# Patient Record
Sex: Female | Born: 1935 | Race: White | Hispanic: No | Smoking: Never smoker
Health system: Southern US, Community
[De-identification: ages and names within clinical notes are randomized; demographics above are authoritative.]

## PROBLEM LIST (undated history)

## (undated) DIAGNOSIS — R569 Unspecified convulsions: Secondary | ICD-10-CM

## (undated) DIAGNOSIS — I10 Essential (primary) hypertension: Secondary | ICD-10-CM

## (undated) DIAGNOSIS — M1990 Unspecified osteoarthritis, unspecified site: Secondary | ICD-10-CM

## (undated) DIAGNOSIS — E119 Type 2 diabetes mellitus without complications: Secondary | ICD-10-CM

## (undated) DIAGNOSIS — I4891 Unspecified atrial fibrillation: Secondary | ICD-10-CM

## (undated) DIAGNOSIS — K449 Diaphragmatic hernia without obstruction or gangrene: Secondary | ICD-10-CM

## (undated) DIAGNOSIS — D649 Anemia, unspecified: Secondary | ICD-10-CM

## (undated) DIAGNOSIS — K219 Gastro-esophageal reflux disease without esophagitis: Secondary | ICD-10-CM

## (undated) DIAGNOSIS — C4491 Basal cell carcinoma of skin, unspecified: Secondary | ICD-10-CM

## (undated) DIAGNOSIS — E785 Hyperlipidemia, unspecified: Secondary | ICD-10-CM

## (undated) HISTORY — DX: Unspecified osteoarthritis, unspecified site: M19.90

## (undated) HISTORY — DX: Basal cell carcinoma of skin, unspecified: C44.91

## (undated) HISTORY — PX: CARDIAC DEFIBRILLATOR PLACEMENT: SHX171

## (undated) HISTORY — DX: Hyperlipidemia, unspecified: E78.5

## (undated) HISTORY — DX: Unspecified convulsions: R56.9

## (undated) HISTORY — PX: TUBAL LIGATION: SHX77

## (undated) HISTORY — PX: CHOLECYSTECTOMY: SHX55

## (undated) HISTORY — DX: Anemia, unspecified: D64.9

## (undated) HISTORY — PX: SPINAL CORD STIMULATOR REMOVAL: SHX2423

## (undated) HISTORY — DX: Gastro-esophageal reflux disease without esophagitis: K21.9

---

## 2001-02-25 HISTORY — PX: CATARACT EXTRACTION: SUR2

## 2001-06-11 HISTORY — PX: LUMBAR LAMINECTOMY: SHX95

## 2006-02-09 HISTORY — PX: LUMBAR LAMINECTOMY: SHX95

## 2006-07-07 HISTORY — PX: LUMBAR LAMINECTOMY: SHX95

## 2009-03-28 DIAGNOSIS — R569 Unspecified convulsions: Secondary | ICD-10-CM

## 2009-03-28 HISTORY — DX: Unspecified convulsions: R56.9

## 2016-03-26 ENCOUNTER — Encounter (HOSPITAL_COMMUNITY): Payer: Self-pay | Admitting: Emergency Medicine

## 2016-03-26 ENCOUNTER — Ambulatory Visit (HOSPITAL_COMMUNITY)
Admission: EM | Admit: 2016-03-26 | Discharge: 2016-03-26 | Disposition: A | Discharge status: Home / Self Care | Payer: TRICARE For Life (TFL) | Attending: Emergency Medicine | Admitting: Emergency Medicine

## 2016-03-26 DIAGNOSIS — I10 Essential (primary) hypertension: Secondary | ICD-10-CM

## 2016-03-26 DIAGNOSIS — R413 Other amnesia: Secondary | ICD-10-CM

## 2016-03-26 DIAGNOSIS — E119 Type 2 diabetes mellitus without complications: Secondary | ICD-10-CM

## 2016-03-26 DIAGNOSIS — Z760 Encounter for issue of repeat prescription: Secondary | ICD-10-CM

## 2016-03-26 HISTORY — DX: Type 2 diabetes mellitus without complications: E11.9

## 2016-03-26 HISTORY — DX: Essential (primary) hypertension: I10

## 2016-03-26 HISTORY — DX: Diaphragmatic hernia without obstruction or gangrene: K44.9

## 2016-03-26 MED ORDER — MEMANTINE HCL 10 MG PO TABS: 10.0000 mg | ORAL_TABLET | Freq: Two times a day (BID) | ORAL | 2 refills | Status: DC

## 2016-03-26 MED ORDER — SITAGLIPTIN PHOSPHATE 100 MG PO TABS: 100.0000 mg | ORAL_TABLET | Freq: Every day | ORAL | 2 refills | Status: DC

## 2016-03-26 MED ORDER — METOPROLOL TARTRATE 50 MG PO TABS: 25.0000 mg | ORAL_TABLET | Freq: Two times a day (BID) | ORAL | 2 refills | Status: DC

## 2016-03-26 MED ORDER — SERTRALINE HCL 50 MG PO TABS: 50.0000 mg | ORAL_TABLET | Freq: Every day | ORAL | 2 refills | Status: DC

## 2016-03-26 MED ORDER — DONEPEZIL HCL 10 MG PO TABS: 10.0000 mg | ORAL_TABLET | Freq: Every day | ORAL | 2 refills | Status: DC

## 2016-03-26 MED ORDER — CYANOCOBALAMIN 1000 MCG/ML IJ SOLN: 1000.0000 ug | INTRAMUSCULAR | 0 refills | Status: DC

## 2016-03-26 NOTE — ED Triage Notes (Signed)
Patient has moved to [place] from [place].  Patient is requesting refill of medications

## 2016-03-26 NOTE — ED Provider Notes (Signed)
Brenda [name]    CSN: [id number] Arrival date & time: 03/26/16  1205     History   Chief Complaint Chief Complaint  Patient presents with  . Medication Refill    HPI Brenda [name] is a 82 y.o. female.   HPI She is an 82 year old woman here with her husband for medication refill. They recently moved here from [place] and has been unable to get their prescriptions to transfer. They're working on finding a primary care doctor. She denies any acute concerns or complaints.  Past Medical History:  Diagnosis Date  . Diabetes mellitus without complication ([place])   . Hiatal hernia   . Hypertension     There are no active problems to display for this patient.   Past Surgical History:  Procedure Laterality Date  . BACK SURGERY    . CARDIAC DEFIBRILLATOR PLACEMENT      OB History    No data available       Home Medications    Prior to Admission medications   Medication Sig Start Date End Date Taking? Authorizing Provider  atorvastatin (LIPITOR) 40 MG tablet Take 40 mg by mouth daily.   Yes Historical Provider, MD  dipyridamole-aspirin (AGGRENOX) 200-25 MG 12hr capsule Take 1 capsule by mouth 2 (two) times daily.   Yes Historical Provider, MD  HYDROcodone-acetaminophen (NORCO) 7.5-325 MG tablet Take 1 tablet by mouth 4 (four) times daily as needed for moderate pain.   Yes Historical Provider, MD  lisinopril (PRINIVIL,ZESTRIL) 10 MG tablet Take 10 mg by mouth daily.   Yes Historical Provider, MD  Multiple Vitamins-Minerals (ICAPS AREDS 2 PO) Take by mouth 2 (two) times daily.   Yes Historical Provider, MD  OVER THE COUNTER MEDICATION "prolin" 2 shot per year   Yes Historical Provider, MD  pregabalin (LYRICA) 25 MG capsule Take 25 mg by mouth 2 (two) times daily.   Yes Historical Provider, MD  ranitidine (ZANTAC) 150 MG tablet Take 150 mg by mouth 2 (two) times daily.   Yes Historical Provider, MD  VITAMIN B1-B12 IM Inject into the muscle. One injection once  a week   Yes Historical Provider, MD  VITAMIN D, ERGOCALCIFEROL, PO Take 5,000 Units by mouth.   Yes Historical Provider, MD  cyanocobalamin (,VITAMIN B-12,) 1000 MCG/ML injection Inject 1 mL (1,000 mcg total) into the skin once a week. 03/26/16   Melony Overly, MD  donepezil (ARICEPT) 10 MG tablet Take 1 tablet (10 mg total) by mouth at bedtime. 03/26/16   Melony Overly, MD  memantine (NAMENDA) 10 MG tablet Take 1 tablet (10 mg total) by mouth 2 (two) times daily. 03/26/16   Melony Overly, MD  metoprolol (LOPRESSOR) 50 MG tablet Take 0.5 tablets (25 mg total) by mouth 2 (two) times daily. 03/26/16   Melony Overly, MD  sertraline (ZOLOFT) 50 MG tablet Take 1 tablet (50 mg total) by mouth daily. 03/26/16   Melony Overly, MD  sitaGLIPtin (JANUVIA) 100 MG tablet Take 1 tablet (100 mg total) by mouth daily. 03/26/16   Melony Overly, MD    Family History No family history on file.  Social History Social History  Substance Use Topics  . Smoking status: Never Smoker  . Smokeless tobacco: Not on file  . Alcohol use No     Allergies   Aspirin; Erythromycin; Sotalol; Augmentin [amoxicillin-pot clavulanate]; Crestor [rosuvastatin calcium]; Keflex [cephalexin]; and Septra [sulfamethoxazole-trimethoprim]   Review of Systems Review of Systems As in history of present illness  Physical Exam Triage Vital Signs ED Triage Vitals  Enc Vitals Group     BP      Pulse      Resp      Temp      Temp src      SpO2      Weight      Height      Head Circumference      Peak Flow      Pain Score      Pain Loc      Pain Edu?      Excl. in [place]?    No data found.   Updated Vital Signs BP 147/67 (BP Location: Right Arm)   Pulse 80   Temp 97.5 F (36.4 C) (Oral)   Resp 16   SpO2 100%   Visual Acuity Right Eye Distance:   Left Eye Distance:   Bilateral Distance:    Right Eye Near:   Left Eye Near:    Bilateral Near:     Physical Exam  Constitutional: She appears well-developed and  well-nourished. No distress.  Cardiovascular: Normal rate, regular rhythm and normal heart sounds.   No murmur heard. Pulmonary/Chest: Effort normal and breath sounds normal. No respiratory distress. She has no wheezes. She has no rales.  Neurological: She is alert.     UC Treatments / Results  Labs (all labs ordered are listed, but only abnormal results are displayed) Labs Reviewed - No data to display  EKG  EKG Interpretation None       Radiology No results found.  Procedures Procedures (including critical care time)  Medications Ordered in UC Medications - No data to display   Initial Impression / Assessment and Plan / UC Course  I have reviewed the triage vital signs and the nursing notes.  Pertinent labs & imaging results that were available during my care of the patient were reviewed by me and considered in my medical decision making (see chart for details).  Clinical Course     I provided a 3 month supply of the medications. Prescriptions were sent based on prescription bottles. She will continue to work on finding a PCP.  Final Clinical Impressions(s) / UC Diagnoses   Final diagnoses:  Medication refill  Type 2 diabetes mellitus without complication, without long-term current use of insulin ([place])  Memory disorder  Essential hypertension    New Prescriptions Discharge Medication List as of 03/26/2016  1:31 PM    START taking these medications   Details  cyanocobalamin (,VITAMIN B-12,) 1000 MCG/ML injection Inject 1 mL (1,000 mcg total) into the skin once a week., Starting Sat 03/26/2016, Normal    sertraline (ZOLOFT) 50 MG tablet Take 1 tablet (50 mg total) by mouth daily., Starting Sat 03/26/2016, Normal         Melony Overly, MD 03/26/16 1409

## 2016-03-26 NOTE — Discharge Instructions (Signed)
I've provided a 3 month supply of your medications. Please continue to work on finding a primary care doctor.

## 2016-05-05 ENCOUNTER — Encounter: Payer: Self-pay | Admitting: Family Medicine

## 2016-05-05 ENCOUNTER — Ambulatory Visit (INDEPENDENT_AMBULATORY_CARE_PROVIDER_SITE_OTHER): Payer: Medicare Other | Admitting: Family Medicine

## 2016-05-05 ENCOUNTER — Encounter: Payer: Self-pay | Admitting: Gastroenterology

## 2016-05-05 VITALS — BP 124/50 | HR 77 | Temp 98.0°F | Ht 60.98 in | Wt 119.4 lb

## 2016-05-05 DIAGNOSIS — M545 Other chronic pain: Secondary | ICD-10-CM

## 2016-05-05 DIAGNOSIS — I10 Essential (primary) hypertension: Secondary | ICD-10-CM | POA: Diagnosis not present

## 2016-05-05 DIAGNOSIS — K449 Diaphragmatic hernia without obstruction or gangrene: Secondary | ICD-10-CM | POA: Diagnosis not present

## 2016-05-05 DIAGNOSIS — R413 Other amnesia: Secondary | ICD-10-CM

## 2016-05-05 DIAGNOSIS — Z95810 Presence of automatic (implantable) cardiac defibrillator: Secondary | ICD-10-CM | POA: Diagnosis not present

## 2016-05-05 DIAGNOSIS — E119 Type 2 diabetes mellitus without complications: Secondary | ICD-10-CM

## 2016-05-05 DIAGNOSIS — Z8673 Personal history of transient ischemic attack (TIA), and cerebral infarction without residual deficits: Secondary | ICD-10-CM

## 2016-05-05 DIAGNOSIS — G8929 Other chronic pain: Secondary | ICD-10-CM

## 2016-05-05 DIAGNOSIS — M549 Dorsalgia, unspecified: Secondary | ICD-10-CM | POA: Insufficient documentation

## 2016-05-05 LAB — COMPLETE METABOLIC PANEL WITH GFR
ALT: 8 U/L (ref 6–29)
AST: 17 U/L (ref 10–35)
Albumin: 3.7 g/dL (ref 3.6–5.1)
Alkaline Phosphatase: 73 U/L (ref 33–130)
BUN: 26 mg/dL — ABNORMAL HIGH (ref 7–25)
CO2: 22 mmol/L (ref 20–31)
Calcium: 9 mg/dL (ref 8.6–10.4)
Chloride: 105 mmol/L (ref 98–110)
Creat: 1.48 mg/dL — ABNORMAL HIGH (ref 0.60–0.88)
GFR, Est African American: 38 mL/min — ABNORMAL LOW (ref >=60)
GFR, Est Non African American: 33 mL/min — ABNORMAL LOW (ref >=60)
Glucose, Bld: 122 mg/dL — ABNORMAL HIGH (ref 65–99)
Potassium: 4.2 mmol/L (ref 3.5–5.3)
Sodium: 138 mmol/L (ref 135–146)
Total Bilirubin: 0.4 mg/dL (ref 0.2–1.2)
Total Protein: 6.9 g/dL (ref 6.1–8.1)

## 2016-05-05 LAB — CBC
HCT: 29 % — ABNORMAL LOW (ref 35.0–45.0)
Hemoglobin: 9.5 g/dL — ABNORMAL LOW (ref 11.7–15.5)
MCH: 31.5 pg (ref 27.0–33.0)
MCHC: 32.8 g/dL (ref 32.0–36.0)
MCV: 96 fL (ref 80.0–100.0)
MPV: 10.2 fL (ref 7.5–12.5)
Platelets: 198 10*3/uL (ref 140–400)
RBC: 3.02 MIL/uL — ABNORMAL LOW (ref 3.80–5.10)
RDW: 18 % — ABNORMAL HIGH (ref 11.0–15.0)
WBC: 7.4 10*3/uL (ref 3.8–10.8)

## 2016-05-05 LAB — POCT GLYCOSYLATED HEMOGLOBIN (HGB A1C): Hemoglobin A1C: 5.7

## 2016-05-05 MED ORDER — ASPIRIN EC 81 MG PO TBEC: 81.0000 mg | DELAYED_RELEASE_TABLET | Freq: Every day | ORAL | 3 refills | Status: DC

## 2016-05-05 NOTE — Patient Instructions (Addendum)
It was a pleasure to see you today! Thank you for choosing [facility] for your primary care. Brenda [name] was seen for new patient appt. I would like to see you back in 3 months to check on your blood pressure and stomach pain. Call us if you need [place] prior to this.   STOP taking Namena, Aricept, Aggrenox, and Januvia. Try to decrease the Norco to once per day. You can take up to 4 tylenol pills (regular strength 500mg ) per day, but you can take 1 norco in addition to this.   Best,  Dr. Lindell Noe

## 2016-05-05 NOTE — Assessment & Plan Note (Signed)
Recheck hemoglobin A1c today, result was 5.7. Discontinued Januvia, A1c goal for this patient would be greater than 8 given risk of hypoglycemia and falls with tight glucose control.

## 2016-05-05 NOTE — Assessment & Plan Note (Signed)
Patient is not sure of dating of her stroke, daughter reports that it was an incidental finding. Was placed on Aggrenox for this, discontinued this medicine and recommended the patient take enteric-coated aspirin 81 mg daily with food.

## 2016-05-05 NOTE — Assessment & Plan Note (Signed)
Last interrogated November 2017, several episodes of non-concerning sinus tach were found at this time. Placed cardiac referral for future monitoring.

## 2016-05-05 NOTE — Assessment & Plan Note (Signed)
Patient was previously diagnosed with Alzheimer's, although since this seems unlikely based on my exam in our conversation today. Discontinued Namenda and Aricept after shared decision-making discussion regarding limited benefit of these medicines, with maximum benefit being likely 3 months of stable memory loss.

## 2016-05-05 NOTE — Assessment & Plan Note (Addendum)
Blood pressure currently within range, if not slightly low. On lisinopril 10 mg and Lopressor 50 mg daily. Offered for the patient to decrease lisinopril, but she deferred this for later appointment. Will check CMP and CBC today.

## 2016-05-05 NOTE — Assessment & Plan Note (Signed)
Pain after eating is bothersome to patient. Placed GI referral for possible repeat EGD. Counseled that continued Zantac as appropriate, offered PPI, but patient declined as prior doctor had told her this was not a good long-term medicine. Suggested the Norco is not an appropriate medicine for this type of pain and should not be helpful.

## 2016-05-05 NOTE — Progress Notes (Signed)
CC: new patient  HPI Recently moved back from [place], lives with husband, daughter, and son. Was formerly a Naval architect. Daughter was a Occupational psychologist.   Patient's primary concern today is what she considers severe reflux due to a previously diagnosed hiatal hernia. She cannot recall the date of her last EGD, but the GI doctor told her that she had a hiatal hernia and should just eat soft foods. She takes zantac with every meal, but she has also been using her Norco (written as 7.5mg  q4hrs) for this painful sensation as well. Occasional feelings of food getting stuck in her throat, but doesn't describe dysphagia. Daughter notes that she has lost about 25 pounds over the last year. They think this is due to her eating less.  Cardiac history- daughter reports that patient was prescribed sotalol in the past and told that a side effect could be seizures, and the patient sees from this, and was hospitalized. Printed cardiology note from [place] suggests that the patient has an AICD secondary to episodes of V. tach, although since ICD placement no further episodes have been found. Last ICD interrogation was November 2017 finding several episodes of non-concerning sinus tachycardia. Patient also takes lisinopril 10 mg for hypertension, as well as Lopressor for such. Has occasional episodes of dizziness, no passing out.  Memory-daughter is concerned for occasional forgetfulness, but at some point patient was diagnosed with Alzheimer's. Based on the extensive nature of our conversation and her detailed memories, this seems unlikely. Had been prescribed Aricept and Namenda for such.  Mood-daughter reports the patient occasionally gets angry. Takes Zoloft 50 mg daily for this.  CVA-patient and daughter are unclear about whether patient has a history of stroke, although daughter states that a stroke was possibly seen on prior imaging. As a result of this, patient is on Aggrenox (aspirin and  antiplatelet combo medicine). No residual deficits noted.  DM-reported diagnosis of diabetes. Only on Januvia. Doesn't check sugars at home.  Back pain-has had placement of spinal stimulator, and uses Norco for this as well.  Health goals: When I noted that she is on many medicines, patient and daughter suggested that they would prefer for her to be on fewer medicines. I suggested that falls are a concern in the elderly, and patient reported that falls are her greatest concern.  CC, SH/smoking status, and VS noted  Objective: BP (!) 124/50   Pulse 77   Temp 98 F (36.7 C) (Oral)   Ht 5' 0.98" (1.549 m)   Wt 119 lb 6.4 oz (54.2 kg)   SpO2 95%   BMI 22.57 kg/m  Gen: NAD, alert, cooperative, and pleasant female.  HEENT: NCAT, EOMI, PERRL CV: RRR, no murmur Resp: CTAB, no wheezes, non-labored Abd: SNTND, BS present, no guarding or organomegaly Ext: No edema, warm Neuro: Alert and oriented, Speech clear, No gross deficits  Assessment and plan:  Essential hypertension Blood pressure currently within range, if not slightly low. On lisinopril 10 mg and Lopressor 50 mg daily. Offered for the patient to decrease lisinopril, but she deferred this for later appointment. Will check CMP and CBC today.  Hiatal hernia Pain after eating is bothersome to patient. Placed GI referral for possible repeat EGD. Counseled that continued Zantac as appropriate, offered PPI, but patient declined as prior doctor had told her this was not a good long-term medicine. Suggested the Norco is not an appropriate medicine for this type of pain and should not be helpful.  Type 2  diabetes mellitus without complication, without long-term current use of insulin ([facility]) Recheck hemoglobin A1c today, result was 5.7. Discontinued Januvia, A1c goal for this patient would be greater than 8 given risk of hypoglycemia and falls with tight glucose control.  History of CVA (cerebrovascular accident) without residual  deficits Patient is not sure of dating of her stroke, daughter reports that it was an incidental finding. Was placed on Aggrenox for this, discontinued this medicine and recommended the patient take enteric-coated aspirin 81 mg daily with food.  Memory change Patient was previously diagnosed with Alzheimer's, although since this seems unlikely based on my exam in our conversation today. Discontinued Namenda and Aricept after shared decision-making discussion regarding limited benefit of these medicines, with maximum benefit being likely 3 months of stable memory loss.  AICD (automatic cardioverter/defibrillator) present Last interrogated November 2017, several episodes of non-concerning sinus tach were found at this time. Placed cardiac referral for future monitoring.  Back pain It has been using Norco 1-2 times per day for this. History of surgeries in March 2003 and November 2007 including laminotomy and spinal stimulator placement. Stimulator was later removed in 2009. Counseled patient that narcotics increase her risk for falls, and suggested that we try to limit her use of Norco to once daily. Gave patient appropriate recommendations for Tylenol dosing (limit to 2 g or less) and suggested that she keep her Norco secured. Prior PCP had given 3 months of every 4 Norco, and patient has a large quantity of these in her purse today.   Orders Placed This Encounter  Procedures  . COMPLETE METABOLIC PANEL WITH GFR  . CBC  . Ambulatory referral to Gastroenterology    Referral Priority:   Routine    Referral Type:   Consultation    Referral Reason:   Specialty Services Required    Number of Visits Requested:   1  . Ambulatory referral to Cardiology    Referral Priority:   Routine    Referral Type:   Consultation    Referral Reason:   Specialty Services Required    Requested Specialty:   Cardiology    Number of Visits Requested:   1  . POCT glycosylated hemoglobin (Hb A1C)    Meds ordered  this encounter  Medications  . aspirin EC 81 MG tablet    Sig: Take 1 tablet (81 mg total) by mouth daily. Take only with food.    Dispense:  30 tablet    Refill:  3     Ralene Ok, MD, PGY1 05/05/2016 3:50 PM

## 2016-05-05 NOTE — Assessment & Plan Note (Signed)
It has been using Norco 1-2 times per day for this. History of surgeries in March 2003 and November 2007 including laminotomy and spinal stimulator placement. Stimulator was later removed in 2009. Counseled patient that narcotics increase her risk for falls, and suggested that we try to limit her use of Norco to once daily. Gave patient appropriate recommendations for Tylenol dosing (limit to 2 g or less) and suggested that she keep her Norco secured. Prior PCP had given 3 months of every 4 Norco, and patient has a large quantity of these in her purse today.

## 2016-05-06 ENCOUNTER — Telehealth: Payer: Self-pay | Admitting: Family Medicine

## 2016-05-06 ENCOUNTER — Encounter: Payer: Self-pay | Admitting: Cardiology

## 2016-05-06 NOTE — Telephone Encounter (Signed)
Reviewed labs from yesterday - Cr elevated to 1.48. Called patient and daughter - they have no recollection of issues with kidney function or Cr. Family has ROI forms for both GI and PCP in [place] and will drop these off early next week. Scheduled patient with me later this month to recheck and get repeat labs. Did not place future orders for labs in case GI or cards (has appts between now and mine) want to draw labs, so we don't duplicate.  Ralene Ok, MD

## 2016-05-09 ENCOUNTER — Encounter: Payer: Self-pay | Admitting: Cardiology

## 2016-05-09 ENCOUNTER — Ambulatory Visit (INDEPENDENT_AMBULATORY_CARE_PROVIDER_SITE_OTHER): Payer: Medicare Other | Admitting: Cardiology

## 2016-05-09 VITALS — BP 148/80 | HR 70 | Ht 61.0 in | Wt 121.4 lb

## 2016-05-09 DIAGNOSIS — Z95810 Presence of automatic (implantable) cardiac defibrillator: Secondary | ICD-10-CM

## 2016-05-09 DIAGNOSIS — I472 Ventricular tachycardia, unspecified: Secondary | ICD-10-CM

## 2016-05-09 LAB — CUP PACEART INCLINIC DEVICE CHECK
Brady Statistic RA Percent Paced: 52 %
Brady Statistic RV Percent Paced: 1 % — CL
Date Time Interrogation Session: 20180212050000
HighPow Impedance: 36 Ohm
HighPow Impedance: 48 Ohm
Implantable Lead Implant Date: 20111110
Implantable Lead Implant Date: 20111110
Implantable Lead Location: 753859
Implantable Lead Location: 753860
Implantable Lead Model: 157
Implantable Lead Model: 4135
Implantable Lead Serial Number: 28803817
Implantable Lead Serial Number: 302861
Implantable Pulse Generator Implant Date: 20111110
Lead Channel Impedance Value: 436 Ohm
Lead Channel Impedance Value: 725 Ohm
Lead Channel Pacing Threshold Amplitude: 0.4 V
Lead Channel Pacing Threshold Amplitude: 0.9 V
Lead Channel Pacing Threshold Pulse Width: 0.4 ms
Lead Channel Pacing Threshold Pulse Width: 0.4 ms
Lead Channel Sensing Intrinsic Amplitude: 12.3 mV
Lead Channel Sensing Intrinsic Amplitude: 3.6 mV
Lead Channel Setting Pacing Amplitude: 2 V
Lead Channel Setting Pacing Amplitude: 2.4 V
Lead Channel Setting Pacing Pulse Width: 0.4 ms
Lead Channel Setting Sensing Sensitivity: 0.6 mV
Pulse Gen Serial Number: 169112

## 2016-05-09 MED ORDER — METOPROLOL TARTRATE 50 MG PO TABS: 50.0000 mg | ORAL_TABLET | Freq: Two times a day (BID) | ORAL | 3 refills | Status: DC

## 2016-05-09 NOTE — Patient Instructions (Signed)
Medication Instructions:   Your physician has recommended you make the following change in your medication:  1) INCREASE Metoprolol tartrate to 50 mg (1 whole tablet)  twice a day   --- If you need a refill on your cardiac medications before your next appointment, please call your pharmacy. ---  Labwork:  None ordered  Testing/Procedures:  None ordered  Follow-Up: Remote monitoring is used to monitor your Pacemaker of ICD from home. This monitoring reduces the number of office visits required to check your device to one time per year. It allows [place] to keep an eye on the functioning of your device to ensure it is working properly. You are scheduled for a device check from home on 08/08/2016. You may send your transmission at any time that day. If you have a wireless device, the transmission will be sent automatically. After your physician reviews your transmission, you will receive a postcard with your next transmission date.   Your physician wants you to follow-up in: 6 months with Dr. Curt Bears.  You will receive a reminder letter in the mail two months in advance. If you don't receive a letter, please call our office to schedule the follow-up appointment.  Thank you for choosing [facility]!!   Trinidad Curet, RN [phone]

## 2016-05-09 NOTE — Progress Notes (Signed)
Electrophysiology Office Note   Date:  05/09/2016   ID:  Brenda [name], DOB Oct 03, 1931, MRN [id number]  PCP:  Ralene Ok, MD  Primary Electrophysiologist:  Abbagail Scaff Meredith Leeds, MD    Chief Complaint  Patient presents with  . DEFIB CHECK     History of Present Illness: Brenda [name] is a 83 y.o. female who presents today for electrophysiology evaluation.   She has a history of CVA, diabetes, and hypertension. She has a defibrillator in place, with note suggesting that it was due to history of ventricular tachycardia. She had an echo done in November 2017 that showed a preserved ejection fraction of 60%, moderate tricuspid regurgitation, and a severely enlarged left atrium.She says that she has been feeling well other than some chest pain. She attributes the chest pain to reflux from her hiatal hernia. She says that the chest pain is worsened when she eats and improved when she takes Zantac. There is no relation to exertion with her chest pain.  According to her family, she was started on sotalol a few years ago. On the sotalol, she had, what her daughter calls seizures. They also describe that she had a flat line on her EKG. She had an ICD placed at that time. Sotalol was thus stopped.   Today, she denies symptoms of palpitations, shortness of breath, orthopnea, PND, lower extremity edema, claudication, dizziness, presyncope, syncope, bleeding, or neurologic sequela. The patient is tolerating medications without difficulties and is otherwise without complaint today.    Past Medical History:  Diagnosis Date  . Diabetes mellitus without complication ([place])   . Hiatal hernia   . Hypertension    Past Surgical History:  Procedure Laterality Date  . BACK SURGERY    . CARDIAC DEFIBRILLATOR PLACEMENT       Current Outpatient Prescriptions  Medication Sig Dispense Refill  . aspirin EC 81 MG tablet Take 1 tablet (81 mg total) by mouth daily. Take only with food. 30 tablet 3   . atorvastatin (LIPITOR) 40 MG tablet Take 40 mg by mouth daily.    . cyanocobalamin (,VITAMIN B-12,) 1000 MCG/ML injection Inject 1 mL (1,000 mcg total) into the skin once a week. 12 mL 0  . HYDROcodone-acetaminophen (NORCO) 7.5-325 MG tablet Take 1 tablet by mouth 4 (four) times daily as needed for moderate pain.    Marland Kitchen lisinopril (PRINIVIL,ZESTRIL) 10 MG tablet Take 10 mg by mouth daily.    . metoprolol (LOPRESSOR) 50 MG tablet Take 0.5 tablets (25 mg total) by mouth 2 (two) times daily. 30 tablet 2  . Multiple Vitamins-Minerals (ICAPS AREDS 2 PO) Take by mouth 2 (two) times daily.    Marland Kitchen OVER THE COUNTER MEDICATION "prolin" 2 shot per year    . pregabalin (LYRICA) 25 MG capsule Take 25 mg by mouth 2 (two) times daily.    . ranitidine (ZANTAC) 150 MG tablet Take 150 mg by mouth 2 (two) times daily.    . sertraline (ZOLOFT) 50 MG tablet Take 1 tablet (50 mg total) by mouth daily. 30 tablet 2  . VITAMIN B1-B12 IM Inject into the muscle. One injection once a week    . VITAMIN D, ERGOCALCIFEROL, PO Take 5,000 Units by mouth.     No current facility-administered medications for this visit.     Allergies:   Aspirin; Erythromycin; Sotalol; Augmentin [amoxicillin-pot clavulanate]; Crestor [rosuvastatin calcium]; Keflex [cephalexin]; and Septra [sulfamethoxazole-trimethoprim]   Social History:  The patient  reports that she has never smoked. She has never used  smokeless tobacco. She reports that she does not drink alcohol.   Family History:  The patient's family history includes Cancer in her sister; Clotting disorder in her maternal uncle; Diabetes in her mother; Heart failure in her father and sister.    ROS:  Please see the history of present illness.   Otherwise, review of systems is positive for Weight loss, appetite change, chest pain, hearing loss, abdominal pain, back pain, balance problems.   All other systems are reviewed and negative.    PHYSICAL EXAM: VS:  BP (!) 148/80   Pulse 70    Ht 5\' 1"  (1.549 m)   Wt 121 lb 6.4 oz (55.1 kg)   BMI 22.94 kg/m  , BMI Body mass index is 22.94 kg/m. GEN: Well nourished, well developed, in no acute distress  HEENT: normal  Neck: no JVD, carotid bruits, or masses Cardiac: RRR; no murmurs, rubs, or gallops,no edema  Respiratory:  clear to auscultation bilaterally, normal work of breathing GI: soft, nontender, nondistended, + BS MS: no deformity or atrophy  Skin: warm and dry,  device pocket is well healed Neuro:  Strength and sensation are intact Psych: euthymic mood, full affect  EKG:  EKG is ordered today. Personal review of the ekg ordered shows A paced, V sense, rate 70   Device interrogation is reviewed today in detail.  See PaceArt for details.   Recent Labs: 05/05/2016: ALT 8; BUN 26; Creat 1.48; Hemoglobin 9.5; Platelets 198; Potassium 4.2; Sodium 138    Lipid Panel  No results found for: CHOL, TRIG, HDL, CHOLHDL, VLDL, LDLCALC, LDLDIRECT   Wt Readings from Last 3 Encounters:  05/09/16 121 lb 6.4 oz (55.1 kg)  05/05/16 119 lb 6.4 oz (54.2 kg)      Other studies Reviewed: Additional studies/ records that were reviewed today include: PCP notes   ASSESSMENT AND PLAN:  1.  Hypertension: Mildly elevated today, was normal at her PCPs visit.  2. ICD in place: Per records was placed due to a history of ventricular tachycardia. only very short runs of nonsustained VT noted on her device interrogation. She also had SVT noted. We'll plan to increase her metoprolol to 50 mg twice a day.  Current medicines are reviewed at length with the patient today.   The patient does not have concerns regarding her medicines.  The following changes were made today:  Increase metoprolol  Labs/ tests ordered today include:  No orders of the defined types were placed in this encounter.    Disposition:   FU with Patches Mcdonnell 6 months  Signed, Piercen Covino Meredith Leeds, MD  05/09/2016 3:27 PM     [facility] [address] [place] [address] [phone] (office) [phone] (fax)

## 2016-05-20 ENCOUNTER — Encounter: Payer: Self-pay | Admitting: Gastroenterology

## 2016-05-20 ENCOUNTER — Ambulatory Visit (INDEPENDENT_AMBULATORY_CARE_PROVIDER_SITE_OTHER): Payer: Medicare Other | Admitting: Gastroenterology

## 2016-05-20 VITALS — BP 140/80 | HR 85 | Ht 59.0 in | Wt 119.0 lb

## 2016-05-20 DIAGNOSIS — R1310 Dysphagia, unspecified: Secondary | ICD-10-CM

## 2016-05-20 DIAGNOSIS — K5900 Constipation, unspecified: Secondary | ICD-10-CM

## 2016-05-20 DIAGNOSIS — K219 Gastro-esophageal reflux disease without esophagitis: Secondary | ICD-10-CM | POA: Diagnosis not present

## 2016-05-20 MED ORDER — OMEPRAZOLE 40 MG PO CPDR: 40.0000 mg | DELAYED_RELEASE_CAPSULE | Freq: Every day | ORAL | 1 refills | Status: DC

## 2016-05-20 NOTE — Patient Instructions (Signed)
If you are age 81 or older, your body mass index should be between 23-30. Your Body mass index is 24.04 kg/m. If this is out of the aforementioned range listed, please consider follow up with your Primary Care Provider.  If you are age 20 or younger, your body mass index should be between 19-25. Your Body mass index is 24.04 kg/m. If this is out of the aformentioned range listed, please consider follow up with your Primary Care Provider.   We have sent the following medications to your pharmacy for you to pick up at your convenience:  Omeprazole  Use Miralax as needed.  Use Gaviscon as needed.  You have been scheduled for an endoscopy. Please follow written instructions given to you at your visit today. If you use inhalers (even only as needed), please bring them with you on the day of your procedure. Your physician has requested that you go to www.startemmi.com and enter the access code given to you at your visit today. This web site gives a general overview about your procedure. However, you should still follow specific instructions given to you by our office regarding your preparation for the procedure.  Thank you.

## 2016-05-20 NOTE — Progress Notes (Signed)
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   She thinks symptoms ongoing since December which is bothering her. She has dysphagia with food and liquids which occurs in the sternal notch. She denies any symptoms of aspiration such as coughing or choking with eating. She reports this is periodic dysphagia, not will all bites but feels it with most meals. She has soreness in her chest and pyrosis. She has some odynophagia as well. She is eating less due to this issue. She has lost weight from 135 to 119lb over a few months due to this issue. She reports some decreased appetite with this. She is taking zantac 150mg  BID which helps but does not prevent symptoms. She thinks she would feel worse off Zantac.   She reports a history of a prior endoscopy in 2015, done for dysphagia. She is not sure what it showed other than hiatal hernia, done in [place]. She has had a prior colonoscopy, unsure of what the results were. No polyps in the past.   No FH of colon, esophageal, or gastric cancer.   She has seen cardiology on 05/09/16 who thought her chest discomfort was due to reflux.   She denies any blood in the stools, occasional constipation, not taking much this at present time other than Dulcolax which can be rather strong for her.   Echo 01/28/16 - EF 65%  Past Medical History:  Diagnosis Date  . Anemia   . Arthritis   . Basal cell carcinoma    nose  . Diabetes mellitus without complication ([place])   . GERD (gastroesophageal reflux disease)   . Hiatal hernia   . HLD (hyperlipidemia)   . Hypertension   . Seizure ([place]) 2011     Past Surgical History:  Procedure Laterality Date  . CARDIAC DEFIBRILLATOR PLACEMENT    . CATARACT EXTRACTION Left 02/2001  . CHOLECYSTECTOMY    . LUMBAR LAMINECTOMY  06/11/2001  . LUMBAR LAMINECTOMY  02/09/2006  . LUMBAR LAMINECTOMY  07/07/2006   with spianal cord  stimulator  . SPINAL CORD STIMULATOR REMOVAL  14/2009  . TUBAL LIGATION     Family History  Problem Relation Age of Onset  . Diabetes Mother   . Heart failure Father   . Breast cancer Sister   . Clotting disorder Maternal Uncle   . Heart failure Sister   . Diabetes Son   . Diabetes Sister    Social History  Substance Use Topics  . Smoking status: Never Smoker  . Smokeless tobacco: Never Used  . Alcohol use No   Current Outpatient Prescriptions  Medication Sig Dispense Refill  . aspirin EC 81 MG tablet Take 1 tablet (81 mg total) by mouth daily. Take only with food. 30 tablet 3  . atorvastatin (LIPITOR) 40 MG tablet Take 40 mg by mouth daily.    . cyanocobalamin (,VITAMIN B-12,) 1000 MCG/ML injection Inject 1 mL (1,000 mcg total) into the skin once a week. 12 mL 0  . HYDROcodone-acetaminophen (NORCO) 7.5-325 MG tablet Take 1 tablet by mouth 4 (four) times daily as needed for moderate pain.    Marland Kitchen lisinopril (PRINIVIL,ZESTRIL) 10 MG tablet Take 10 mg by mouth daily.    . metoprolol (LOPRESSOR) 50 MG tablet Take 1 tablet (50 mg total) by mouth 2 (two) times daily. 180 tablet 3  . Multiple Vitamins-Minerals (ICAPS AREDS 2 PO) Take by  mouth 2 (two) times daily.    Marland Kitchen OVER THE COUNTER MEDICATION "prolin" 2 shot per year    . pregabalin (LYRICA) 25 MG capsule Take 25 mg by mouth 2 (two) times daily.    . ranitidine (ZANTAC) 150 MG tablet Take 150 mg by mouth 2 (two) times daily.    . sertraline (ZOLOFT) 50 MG tablet Take 1 tablet (50 mg total) by mouth daily. 30 tablet 2  . VITAMIN B1-B12 IM Inject into the muscle. One injection once a week    . VITAMIN D, ERGOCALCIFEROL, PO Take 5,000 Units by mouth.     No current facility-administered medications for this visit.    Allergies  Allergen Reactions  . Erythromycin Nausea Only  . Sotalol Other (See Comments)    seizure  . Augmentin [Amoxicillin-Pot Clavulanate] Rash  . Crestor [Rosuvastatin Calcium] Rash  . Keflex [Cephalexin] Rash    . Septra [Sulfamethoxazole-Trimethoprim] Rash     Review of Systems: All systems reviewed and negative except where noted in HPI.   Lab Results  Component Value Date   WBC 7.4 05/05/2016   HGB 9.5 (L) 05/05/2016   HCT 29.0 (L) 05/05/2016   MCV 96.0 05/05/2016   PLT 198 05/05/2016    Lab Results  Component Value Date   CREATININE 1.48 (H) 05/05/2016   BUN 26 (H) 05/05/2016   NA 138 05/05/2016   K 4.2 05/05/2016   CL 105 05/05/2016   CO2 22 05/05/2016     Physical Exam: BP 140/80 (BP Location: Left Arm, Patient Position: Sitting, Cuff Size: Normal)   Pulse 85   Ht 4\' 11"  (1.499 m) Comment: pt has hump in back, height measured without shoes  Wt 119 lb (54 kg)   BMI 24.04 kg/m   Constitutional: Pleasant,well-developed, female in no acute distress, uses walker for ambulation HEENT: Normocephalic and atraumatic. Conjunctivae are normal. No scleral icterus. Neck supple.  Cardiovascular: Normal rate, regular rhythm.  Pulmonary/chest: Effort normal and breath sounds normal. No wheezing, rales or rhonchi. Abdominal: Soft, nondistended, nontender. . There are no masses palpable. No hepatomegaly. Extremities: no edema Lymphadenopathy: No cervical adenopathy noted. Neurological: Alert and oriented to person place and time. Skin: Skin is warm and dry. No rashes noted. Psychiatric: Normal mood and affect. Behavior is normal.   ASSESSMENT AND PLAN: 83 year old female here for new patient evaluation discussed following issues:  Dysphagia / GERD - symptoms of reflux poorly controlled on Zantac, recommend she escalate therapy to PPI, start omeprazole 40 mg once daily. Discussed risks and benefits of long-term use, however we'll start with a few weeks course to see if we can get her feeling better acutely. Ultimately given the symptoms of dysphagia, worsening reflux, anemia, recommend we proceed with upper endoscopy to further evaluate and potentially treat with dilation. I discussed  risks and benefits of anesthesia and endoscopy with the patient in her daughter verbalized understanding and he wanted to proceed. We had an opening in 3 days and she'll be scheduled to have this done at the time.   Constipation - recommend trial of MiraLAX or Citrucel as needed for constipation, she can follow-up as needed if this persists.  Benavides Cellar, MD [facility] Pager [phone]  CC: Sela Hilding, MD

## 2016-05-23 ENCOUNTER — Ambulatory Visit (AMBULATORY_SURGERY_CENTER): Payer: Medicare Other | Admitting: Gastroenterology

## 2016-05-23 ENCOUNTER — Encounter: Payer: Self-pay | Admitting: Gastroenterology

## 2016-05-23 VITALS — BP 147/89 | HR 94 | Temp 96.6°F | Resp 17 | Ht 59.0 in | Wt 119.0 lb

## 2016-05-23 DIAGNOSIS — K222 Esophageal obstruction: Secondary | ICD-10-CM

## 2016-05-23 DIAGNOSIS — Z8673 Personal history of transient ischemic attack (TIA), and cerebral infarction without residual deficits: Secondary | ICD-10-CM | POA: Diagnosis not present

## 2016-05-23 DIAGNOSIS — E119 Type 2 diabetes mellitus without complications: Secondary | ICD-10-CM | POA: Diagnosis not present

## 2016-05-23 DIAGNOSIS — I10 Essential (primary) hypertension: Secondary | ICD-10-CM | POA: Diagnosis not present

## 2016-05-23 DIAGNOSIS — R1310 Dysphagia, unspecified: Secondary | ICD-10-CM

## 2016-05-23 MED ORDER — SODIUM CHLORIDE 0.9 % IV SOLN: 500.0000 mL | INTRAVENOUS | Status: DC

## 2016-05-23 NOTE — Op Note (Signed)
[facility] Patient Name: Quadira Santos Procedure Date: 05/23/2016 9:49 AM MRN: [id number] Endoscopist: Remo Lipps P. Navarro Nine MD, MD Age: 83 Referring MD:  Date of Birth: [birth year] Gender: Female Account #: [id number] Procedure:                Upper GI endoscopy Indications:              Dysphagia Medicines:                Monitored Anesthesia Care Procedure:                Pre-Anesthesia Assessment:                           - Prior to the procedure, a History and Physical                            was performed, and patient medications and                            allergies were reviewed. The patient's tolerance of                            previous anesthesia was also reviewed. The risks                            and benefits of the procedure and the sedation                            options and risks were discussed with the patient.                            All questions were answered, and informed consent                            was obtained. Prior Anticoagulants: The patient has                            taken aspirin, last dose was 1 day prior to                            procedure. ASA Grade Assessment: III - A patient                            with severe systemic disease. After reviewing the                            risks and benefits, the patient was deemed in                            satisfactory condition to undergo the procedure.                           After obtaining informed consent, the endoscope was  passed under direct vision. Throughout the                            procedure, the patient's blood pressure, pulse, and                            oxygen saturations were monitored continuously. The                            Model GIF-HQ190 [id number]) scope was introduced                            through the mouth, and advanced to the second part                            of duodenum. The upper GI  endoscopy was                            accomplished without difficulty. The patient                            tolerated the procedure well. Scope In: Scope Out: Findings:                 Esophagogastric landmarks were identified: the                            Z-line was found at 31 cm, the gastroesophageal                            junction was found at 31 cm and the upper extent of                            the gastric folds was found at 36 cm from the                            incisors.                           A 5 cm hiatal hernia was present.                           One benign-appearing, intrinsic stenosis was found                            31 cm from the incisors. This measured less than                            one cm (in length) and was traversed. A TTS dilator                            was passed through the scope. Dilation with a  16-17-18 mm balloon dilator was performed to 18 mm.                            Biopsies were taken with a cold forceps for                            histology and to open the stricture further.                           The exam of the esophagus was otherwise normal.                           The entire examined stomach was normal.                           A medium diverticulum was found in the second                            portion of the duodenum.                           The exam of the duodenum was otherwise normal. Complications:            No immediate complications. Estimated blood loss:                            Minimal. Estimated Blood Loss:     Estimated blood loss was minimal. Estimated blood                            loss was minimal. Impression:               - Esophagogastric landmarks identified.                           - 5 cm hiatal hernia.                           - Benign-appearing esophageal stenosis. Dilated to                            9mm with mucosal wrent. Biopsied.                            - Normal stomach.                           - Duodenal diverticulum.                           - Otherwise normal duodenum. Recommendation:           - Patient has a contact number available for                            emergencies. The signs and symptoms of potential  delayed complications were discussed with the                            patient. Return to normal activities tomorrow.                            Written discharge instructions were provided to the                            patient.                           - Resume previous diet.                           - Continue present medications including omeprazole                            40mg  daily                           - Await pathology results.                           - Repeat upper endoscopy PRN for retreatment.                           - Patient has a contact number available for                            emergencies. The signs and symptoms of potential                            delayed complications were discussed with the                            patient. Return to normal activities tomorrow.                            Written discharge instructions were provided to the                            patient. Remo Lipps P. Randol Zumstein MD, MD 05/23/2016 10:21:03 AM This report has been signed electronically.

## 2016-05-23 NOTE — Progress Notes (Signed)
Called to room to assist during endoscopic procedure.  Patient ID and intended procedure confirmed with present staff. Received instructions for my participation in the procedure from the performing physician.  

## 2016-05-23 NOTE — Patient Instructions (Signed)
YOU HAD AN ENDOSCOPIC PROCEDURE TODAY AT [facility]:   Refer to the procedure report that was given to you for any specific questions about what was found during the examination.  If the procedure report does not answer your questions, please call your gastroenterologist to clarify.  If you requested that your care partner not be given the details of your procedure findings, then the procedure report has been included in a sealed envelope for you to review at your convenience later.  YOU SHOULD EXPECT: Some feelings of bloating in the abdomen. Passage of more gas than usual.  Walking can help get rid of the air that was put into your GI tract during the procedure and reduce the bloating. If you had a lower endoscopy (such as a colonoscopy or flexible sigmoidoscopy) you may notice spotting of blood in your stool or on the toilet paper. If you underwent a bowel prep for your procedure, you may not have a normal bowel movement for a few days.  Please Note:  You might notice some irritation and congestion in your nose or some drainage.  This is from the oxygen used during your procedure.  There is no need for concern and it should clear up in a day or so.  SYMPTOMS TO REPORT IMMEDIATELY:    Following upper endoscopy (EGD)  Vomiting of blood or coffee ground material  New chest pain or pain under the shoulder blades  Painful or persistently difficult swallowing  New shortness of breath  Fever of 100F or higher  Black, tarry-looking stools  For urgent or emergent issues, a gastroenterologist can be reached at any hour by calling [phone].   DIET:  FOLLOW DILATION HANDOUT.  ACTIVITY:  You should plan to take it easy for the rest of today and you should NOT DRIVE or use heavy machinery until tomorrow (because of the sedation medicines used during the test).    FOLLOW UP: Our staff will call the number listed on your records the next business day following your procedure to  check on you and address any questions or concerns that you may have regarding the information given to you following your procedure. If we do not reach you, we will leave a message.  However, if you are feeling well and you are not experiencing any problems, there is no need to return our call.  We will assume that you have returned to your regular daily activities without incident.  If any biopsies were taken you will be contacted by phone or by letter within the next 1-3 weeks.  Please call us at [phone] if you have not heard about the biopsies in 3 weeks.    SIGNATURES/CONFIDENTIALITY: You and/or your care partner have signed paperwork which will be entered into your electronic medical record.  These signatures attest to the fact that that the information above on your After Visit Summary has been reviewed and is understood.  Full responsibility of the confidentiality of this discharge information lies with you and/or your care-partner.   Resume medications. Information given on hiatal hernia, Dilation Diet.

## 2016-05-23 NOTE — Progress Notes (Signed)
To PACU, vss patent aw report to rn 

## 2016-05-24 ENCOUNTER — Telehealth: Payer: Self-pay | Admitting: *Deleted

## 2016-05-24 NOTE — Telephone Encounter (Signed)
  Follow up Call-  Call back number 05/23/2016  Post procedure Call Back phone  # [phone]  Permission to leave phone message Yes     Patient questions:  Do you have a fever, pain , or abdominal swelling? No. Pain Score  0 *  Have you tolerated food without any problems? Yes.    Have you been able to return to your normal activities? Yes.    Do you have any questions about your discharge instructions: Diet   No. Medications  No. Follow up visit  No.  Do you have questions or concerns about your Care? No.  Actions: * If pain score is 4 or above: No action needed, pain <4.

## 2016-05-25 ENCOUNTER — Ambulatory Visit (INDEPENDENT_AMBULATORY_CARE_PROVIDER_SITE_OTHER): Payer: Medicare Other | Admitting: Family Medicine

## 2016-05-25 ENCOUNTER — Encounter: Payer: Self-pay | Admitting: Family Medicine

## 2016-05-25 VITALS — BP 148/72 | HR 85 | Temp 97.8°F | Ht 59.0 in | Wt 117.8 lb

## 2016-05-25 DIAGNOSIS — K449 Diaphragmatic hernia without obstruction or gangrene: Secondary | ICD-10-CM | POA: Diagnosis not present

## 2016-05-25 DIAGNOSIS — I10 Essential (primary) hypertension: Secondary | ICD-10-CM

## 2016-05-25 DIAGNOSIS — N179 Acute kidney failure, unspecified: Secondary | ICD-10-CM

## 2016-05-25 MED ORDER — ATORVASTATIN CALCIUM 40 MG PO TABS: 40.0000 mg | ORAL_TABLET | Freq: Every day | ORAL | 0 refills | Status: DC

## 2016-05-25 MED ORDER — ATORVASTATIN CALCIUM 40 MG PO TABS: 40.0000 mg | ORAL_TABLET | Freq: Every day | ORAL | 3 refills | Status: DC

## 2016-05-25 NOTE — Assessment & Plan Note (Signed)
Pain much improved after esophageal stricture dilation. GI has continued omeprazole. Additionally, patient has been eating much better and not taking her Norco at all. Recommended the patient bring the Norco to our clinic to be safely disposed of. Patient and I are both very pleased with her improvement. She will continue to follow with GI.

## 2016-05-25 NOTE — Assessment & Plan Note (Signed)
BP today 150/70. Patient with recent increase in metoprolol by cardiology. Goal blood pressure for this patient would be 140/90. Patient needs refills of 10 mg lisinopril, although I have no a sign for her elevation in creatinine. Will recheck BMP today, and make additional blood pressure medicine management decisions after receiving the creatinine value. Due to risk of falls, would not opt for tight blood pressure control.

## 2016-05-25 NOTE — Progress Notes (Signed)
   CC: follow up Cr, BP, and hiatal hernia  HPI Since our last visit, has been seen with cardiology and GI. Had EGD with findings of hiatal hernia and esophageal stricture, which was dilated. Patient feels so much better after this. Has not been using her Norco at all. Has been eating and drinking much better.  The pressure noted to be mildly elevated a cardiologist, metoprolol was increased from 25 mg twice a day to 50 mg twice a day.  Patient requesting refills of lisinopril, although creatinine was mildly elevated at most recent lab check. Will recheck today.  CC, SH/smoking status, and VS noted  Objective: BP (!) 148/72   Pulse 85   Temp 97.8 F (36.6 C) (Oral)   Ht 4\' 11"  (1.499 m)   Wt 117 lb 12.8 oz (53.4 kg)   SpO2 99%   BMI 23.79 kg/m  Gen: NAD, alert, cooperative, and pleasant. HEENT: NCAT, EOMI, PERRL CV: RRR, no murmur Resp: CTAB, no wheezes, non-labored Abd: SNTND, BS present, no guarding or organomegaly Ext: No edema, warm Neuro: Alert and oriented, Speech clear, No gross deficits  Assessment and plan:  Essential hypertension BP today 150/70. Patient with recent increase in metoprolol by cardiology. Goal blood pressure for this patient would be 140/90. Patient needs refills of 10 mg lisinopril, although I have no a sign for her elevation in creatinine. Will recheck BMP today, and make additional blood pressure medicine management decisions after receiving the creatinine value. Due to risk of falls, would not opt for tight blood pressure control.  Hiatal hernia Pain much improved after esophageal stricture dilation. GI has continued omeprazole. Additionally, patient has been eating much better and not taking her Norco at all. Recommended the patient bring the Norco to our clinic to be safely disposed of. Patient and I are both very pleased with her improvement. She will continue to follow with GI.  AKI (acute kidney injury) ([place]) Unclear whether this is an acute  elevation of creatinine, or a chronic one given that I have no baseline labs for this patient. Recheck BMP today. Given the patient's history of esophageal stricture and poor by mouth intake, creatinine elevation at prior visit could've been due to decreased by mouth intake.    Orders Placed This Encounter  Procedures  . BASIC METABOLIC PANEL WITH GFR    Meds ordered this encounter  Medications  . atorvastatin (LIPITOR) 40 MG tablet    Sig: Take 1 tablet (40 mg total) by mouth daily.    Dispense:  30 tablet    Refill:  0  . atorvastatin (LIPITOR) 40 MG tablet    Sig: Take 1 tablet (40 mg total) by mouth daily.    Dispense:  90 tablet    Refill:  3   Duplicate medications of atorvastatin are ordered as above as patient requested one prescription be ordered locally and one be sent to mail order pharmacy in case the prescription did not arrive in time. Patient and husband both voiced understanding that she is only to take atorvastatin once per day.  Ralene Ok, MD, PGY1 05/25/2016 5:20 PM

## 2016-05-25 NOTE — Assessment & Plan Note (Signed)
Unclear whether this is an acute elevation of creatinine, or a chronic one given that I have no baseline labs for this patient. Recheck BMP today. Given the patient's history of esophageal stricture and poor by mouth intake, creatinine elevation at prior visit could've been due to decreased by mouth intake.

## 2016-05-25 NOTE — Patient Instructions (Signed)
It was a pleasure to see you today! Thank you for choosing [facility] for your primary care. Brenda [name] was seen for follow up on blood pressure and kidney number being elevated. I will call you with the results and if we need to change your blood pressure medicine. See you back in 6 months!   Best,  Dr. Lindell Noe

## 2016-05-26 LAB — BASIC METABOLIC PANEL WITH GFR
BUN: 19 mg/dL (ref 7–25)
CO2: 17 mmol/L — ABNORMAL LOW (ref 20–31)
Calcium: 9 mg/dL (ref 8.6–10.4)
Chloride: 106 mmol/L (ref 98–110)
Creat: 1.29 mg/dL — ABNORMAL HIGH (ref 0.60–0.88)
GFR, Est African American: 45 mL/min — ABNORMAL LOW (ref >=60)
GFR, Est Non African American: 39 mL/min — ABNORMAL LOW (ref >=60)
Glucose, Bld: 128 mg/dL — ABNORMAL HIGH (ref 65–99)
Potassium: 4.2 mmol/L (ref 3.5–5.3)
Sodium: 139 mmol/L (ref 135–146)

## 2016-05-27 ENCOUNTER — Telehealth: Payer: Self-pay | Admitting: Family Medicine

## 2016-05-27 DIAGNOSIS — R7989 Other specified abnormal findings of blood chemistry: Secondary | ICD-10-CM

## 2016-05-27 NOTE — Telephone Encounter (Signed)
Please call patient and have her hold her lisinopril. Her creatinine (kidney function number) is still mildly elevated. I think her kidney likely got dehydrated when she was having trouble swallowing. The lisinopril can cause her kidney number to take longer to come back down to normal. She should continue taking her metoprolol, and she should check her blood pressure at home daily. If her blood pressure is above 160/110, she should call us. I am placing a future order for BMP in 1 week to assess kidney function. She should also try to drink plenty of water.

## 2016-05-31 NOTE — Telephone Encounter (Signed)
Spoke to pt. She understands instructions. Will call if she has questions. Ottis Stain, CMA

## 2016-06-03 ENCOUNTER — Encounter: Payer: Self-pay | Admitting: Gastroenterology

## 2016-06-08 NOTE — Progress Notes (Signed)
Really happy to hear it, thanks for the note. She can come back to see me as needed if symptoms recur in the future.

## 2016-06-14 ENCOUNTER — Telehealth: Payer: Self-pay | Admitting: Family Medicine

## 2016-06-14 NOTE — Telephone Encounter (Signed)
Please call patient and ask her for her [employer] Member ID number. We got a fax back for her lipitor prescription saying they need this information. It is not a card listed under her media tab as far as I can find. Thanks!

## 2016-06-15 ENCOUNTER — Other Ambulatory Visit: Payer: Self-pay | Admitting: *Deleted

## 2016-06-15 MED ORDER — METOPROLOL TARTRATE 50 MG PO TABS: 50.0000 mg | ORAL_TABLET | Freq: Two times a day (BID) | ORAL | 3 refills | Status: DC

## 2016-06-15 NOTE — Telephone Encounter (Signed)
Contacted pt and her husband said it is the same as his.  He said its is [employer] for life and the id number is the last 4 of his social. The number is [phone]. Routing to PCP to see if this is what she needs. Katharina Caper, Henryk Ursin D, [place]

## 2016-06-17 ENCOUNTER — Other Ambulatory Visit: Payer: Self-pay | Admitting: *Deleted

## 2016-06-17 MED ORDER — ATORVASTATIN CALCIUM 40 MG PO TABS: 40.0000 mg | ORAL_TABLET | Freq: Every day | ORAL | 3 refills | Status: DC

## 2016-06-17 MED ORDER — CYANOCOBALAMIN 1000 MCG/ML IJ SOLN: 1000.0000 ug | INTRAMUSCULAR | 0 refills | Status: DC

## 2016-06-17 MED ORDER — LISINOPRIL 10 MG PO TABS: 10.0000 mg | ORAL_TABLET | Freq: Every day | ORAL | 1 refills | Status: DC

## 2016-06-17 MED ORDER — PREGABALIN 25 MG PO CAPS: 25.0000 mg | ORAL_CAPSULE | Freq: Two times a day (BID) | ORAL | 0 refills | Status: DC

## 2016-06-17 MED ORDER — SERTRALINE HCL 50 MG PO TABS: 50.0000 mg | ORAL_TABLET | Freq: Every day | ORAL | 2 refills | Status: DC

## 2016-06-17 NOTE — Telephone Encounter (Signed)
Insurance requires a 90 day supply.  Derl Barrow, RN

## 2016-06-17 NOTE — Telephone Encounter (Signed)
Filled out form for mail order rx and left in to be faxed box. Thanks for your help.

## 2016-07-12 ENCOUNTER — Telehealth: Payer: Self-pay | Admitting: Family Medicine

## 2016-07-12 NOTE — Telephone Encounter (Signed)
Daughter states pt received a letter from [facility] say the Rx for Lyrica was not authorized and needed to be called in or faxed. Ref # [id number]. Pt will need a 90 day supply of Lyrica. [employer] phone number [phone] ep

## 2016-07-13 MED ORDER — PREGABALIN 25 MG PO CAPS: 25.0000 mg | ORAL_CAPSULE | Freq: Two times a day (BID) | ORAL | 0 refills | Status: DC

## 2016-07-13 NOTE — Telephone Encounter (Signed)
I am not sure why [employer] is stating that the Lyrica is not authorized. I have re-printed the script and will place it in the fax box. I have tried to call and speak to someone about this, but was on the line for 20 minutes without getting to a person. I am not prescribing a 90 day prescription, as I want to continue to discuss this medication with the patient as this can increase her risk of falls. Please call family and let them know we are working to resolve this issue. If they would prefer, I am happy to call it in to a local pharmacy instead.

## 2016-07-19 NOTE — Telephone Encounter (Signed)
Contacted pt to see if she had received this Rx and she said she had not so I told her I was going to contact Pharmacy to see what was going on. Contacted pharmacy and spoke with Delcie Roch and she stated that the Rx had been shipped on the 19th and said that it could take up to 2 weeks for pt to receive and then she said it was due to arrive on the 26th.  Contacted pt and gave her this information and told her if she did not see it within the next 2-3days that she should call pharmacy and have them track the delivery for her.  She understood. Katharina Caper, Katheline Brendlinger D, [place]

## 2016-07-30 ENCOUNTER — Encounter (HOSPITAL_COMMUNITY): Payer: Self-pay | Admitting: Emergency Medicine

## 2016-07-30 ENCOUNTER — Emergency Department (HOSPITAL_COMMUNITY): Payer: Medicare Other

## 2016-07-30 ENCOUNTER — Emergency Department (HOSPITAL_COMMUNITY)
Admission: EM | Admit: 2016-07-30 | Discharge: 2016-07-30 | Disposition: A | Discharge status: Home / Self Care | Payer: Medicare Other | Attending: Emergency Medicine | Admitting: Emergency Medicine

## 2016-07-30 DIAGNOSIS — T82118A Breakdown (mechanical) of other cardiac electronic device, initial encounter: Secondary | ICD-10-CM | POA: Insufficient documentation

## 2016-07-30 DIAGNOSIS — I4891 Unspecified atrial fibrillation: Secondary | ICD-10-CM | POA: Diagnosis not present

## 2016-07-30 DIAGNOSIS — Z79899 Other long term (current) drug therapy: Secondary | ICD-10-CM | POA: Insufficient documentation

## 2016-07-30 DIAGNOSIS — I10 Essential (primary) hypertension: Secondary | ICD-10-CM | POA: Insufficient documentation

## 2016-07-30 DIAGNOSIS — Y711 Therapeutic (nonsurgical) and rehabilitative cardiovascular devices associated with adverse incidents: Secondary | ICD-10-CM | POA: Diagnosis not present

## 2016-07-30 DIAGNOSIS — Z95810 Presence of automatic (implantable) cardiac defibrillator: Secondary | ICD-10-CM | POA: Insufficient documentation

## 2016-07-30 DIAGNOSIS — R51 Headache: Secondary | ICD-10-CM | POA: Insufficient documentation

## 2016-07-30 DIAGNOSIS — Z7901 Long term (current) use of anticoagulants: Secondary | ICD-10-CM | POA: Insufficient documentation

## 2016-07-30 DIAGNOSIS — Z85828 Personal history of other malignant neoplasm of skin: Secondary | ICD-10-CM | POA: Diagnosis not present

## 2016-07-30 DIAGNOSIS — E119 Type 2 diabetes mellitus without complications: Secondary | ICD-10-CM | POA: Insufficient documentation

## 2016-07-30 DIAGNOSIS — Z7982 Long term (current) use of aspirin: Secondary | ICD-10-CM | POA: Insufficient documentation

## 2016-07-30 DIAGNOSIS — Z8673 Personal history of transient ischemic attack (TIA), and cerebral infarction without residual deficits: Secondary | ICD-10-CM | POA: Insufficient documentation

## 2016-07-30 DIAGNOSIS — I517 Cardiomegaly: Secondary | ICD-10-CM | POA: Diagnosis not present

## 2016-07-30 LAB — CBC WITH DIFFERENTIAL/PLATELET
Basophils Absolute: 0 10*3/uL (ref 0.0–0.1)
Basophils Relative: 0 %
Eosinophils Absolute: 0 10*3/uL (ref 0.0–0.7)
Eosinophils Relative: 0 %
HCT: 31.7 % — ABNORMAL LOW (ref 36.0–46.0)
Hemoglobin: 11.1 g/dL — ABNORMAL LOW (ref 12.0–15.0)
Lymphocytes Relative: 36 %
Lymphs Abs: 1.7 10*3/uL (ref 0.7–4.0)
MCH: 33.4 pg (ref 26.0–34.0)
MCHC: 35 g/dL (ref 30.0–36.0)
MCV: 95.5 fL (ref 78.0–100.0)
Monocytes Absolute: 0.4 10*3/uL (ref 0.1–1.0)
Monocytes Relative: 9 %
Neutro Abs: 2.6 10*3/uL (ref 1.7–7.7)
Neutrophils Relative %: 55 %
Platelets: 168 10*3/uL (ref 150–400)
RBC: 3.32 MIL/uL — ABNORMAL LOW (ref 3.87–5.11)
RDW: 13.5 % (ref 11.5–15.5)
WBC: 4.8 10*3/uL (ref 4.0–10.5)

## 2016-07-30 LAB — BASIC METABOLIC PANEL
Anion gap: 14 (ref 5–15)
BUN: 27 mg/dL — ABNORMAL HIGH (ref 6–20)
CO2: 17 mmol/L — ABNORMAL LOW (ref 22–32)
Calcium: 8.8 mg/dL — ABNORMAL LOW (ref 8.9–10.3)
Chloride: 103 mmol/L (ref 101–111)
Creatinine, Ser: 1.51 mg/dL — ABNORMAL HIGH (ref 0.44–1.00)
GFR calc Af Amer: 36 mL/min — ABNORMAL LOW (ref >60)
GFR calc non Af Amer: 31 mL/min — ABNORMAL LOW (ref >60)
Glucose, Bld: 137 mg/dL — ABNORMAL HIGH (ref 65–99)
Potassium: 3.9 mmol/L (ref 3.5–5.1)
Sodium: 134 mmol/L — ABNORMAL LOW (ref 135–145)

## 2016-07-30 LAB — I-STAT TROPONIN, ED: Troponin i, poc: 0.08 ng/mL (ref 0.00–0.08)

## 2016-07-30 LAB — MAGNESIUM: Magnesium: 1.9 mg/dL (ref 1.7–2.4)

## 2016-07-30 LAB — TSH: TSH: 3.643 u[IU]/mL (ref 0.350–4.500)

## 2016-07-30 MED ORDER — RIVAROXABAN 15 MG PO TABS: 15.0000 mg | ORAL_TABLET | Freq: Every day | ORAL | 0 refills | Status: DC

## 2016-07-30 MED ORDER — RIVAROXABAN 15 MG PO TABS
15.0000 mg | ORAL_TABLET | Freq: Once | ORAL | Status: AC
  Administered 2016-07-30: 15 mg via ORAL
  Filled 2016-07-30: qty 1

## 2016-07-30 MED ORDER — SODIUM CHLORIDE 0.9 % IV BOLUS (SEPSIS)
1000.0000 mL | Freq: Once | INTRAVENOUS | Status: AC
  Administered 2016-07-30: 1000 mL via INTRAVENOUS

## 2016-07-30 MED ORDER — METOPROLOL TARTRATE 50 MG PO TABS: 75.0000 mg | ORAL_TABLET | Freq: Two times a day (BID) | ORAL | 0 refills | Status: DC
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
## 2016-07-30 NOTE — ED Provider Notes (Signed)
[facility] DEPT Provider Note   CSN: [id number] Arrival date & time: 07/30/16  1807     History   Chief Complaint Chief Complaint  Patient presents with  . possible ICD firing    HPI Brenda [name] is a 83 y.o. female.  Patient with multiple bouts of ICD firing today. Patient states that this morning she thinks that her ICD went off twice and then occurred again just prior to arrival to the ED. Patient has headache and body aches now. She states that ICD was placed about 8 years ago in [place] and has not had any problems with her ICD. She states that she does have some chest pain but has been feeling well overall. Patient denies loss of consciousness, syncope.   The history is provided by the patient.  Illness  This is a new problem. The current episode started 6 to 12 hours ago. The problem has been resolved. Associated symptoms include chest pain and headaches. Pertinent negatives include no abdominal pain and no shortness of breath. Nothing aggravates the symptoms. Nothing relieves the symptoms. She has tried nothing for the symptoms.    Past Medical History:  Diagnosis Date  . Anemia   . Arthritis   . Basal cell carcinoma    nose  . Diabetes mellitus without complication ([place])    pt denies diabetes  . GERD (gastroesophageal reflux disease)   . Hiatal hernia   . HLD (hyperlipidemia)   . Hypertension   . Seizure [facility]) 2011    Patient Active Problem List   Diagnosis Date Noted  . AKI (acute kidney injury) ([place]) 05/25/2016  . Essential hypertension 05/05/2016  . Type 2 diabetes mellitus without complication, without long-term current use of insulin ([place]) 05/05/2016  . Hiatal hernia 05/05/2016  . History of CVA (cerebrovascular accident) without residual deficits 05/05/2016  . Memory change 05/05/2016  . AICD (automatic cardioverter/defibrillator) present 05/05/2016  . Back pain 05/05/2016    Past Surgical History:  Procedure Laterality Date  . CARDIAC  DEFIBRILLATOR PLACEMENT    . CATARACT EXTRACTION Left 02/2001  . CHOLECYSTECTOMY    . LUMBAR LAMINECTOMY  06/11/2001  . LUMBAR LAMINECTOMY  02/09/2006  . LUMBAR LAMINECTOMY  07/07/2006   with spianal cord stimulator  . SPINAL CORD STIMULATOR REMOVAL  14/2009  . TUBAL LIGATION      OB History    No data available       Home Medications    Prior to Admission medications   Medication Sig Start Date End Date Taking? Authorizing Provider  aspirin EC 81 MG tablet Take 1 tablet (81 mg total) by mouth daily. Take only with food. 05/05/16  Yes Sela Hilding, MD  atorvastatin (LIPITOR) 40 MG tablet Take 1 tablet (40 mg total) by mouth daily. 06/17/16  Yes Sela Hilding, MD  Cholecalciferol (VITAMIN D3) 5000 units CAPS Take 5,000 Units by mouth daily.   Yes Historical Provider, MD  cyanocobalamin (,VITAMIN B-12,) 1000 MCG/ML injection Inject 1 mL (1,000 mcg total) into the skin once a week. 06/17/16  Yes Sela Hilding, MD  denosumab (PROLIA) 60 MG/ML SOLN injection Inject 60 mg into the skin every 6 (six) months. Administer in upper arm, thigh, or abdomen   Yes Historical Provider, MD  lisinopril (PRINIVIL,ZESTRIL) 10 MG tablet Take 1 tablet (10 mg total) by mouth daily. 06/17/16  Yes Sela Hilding, MD  loperamide (IMODIUM A-D) 2 MG tablet Take 2 mg by mouth 4 (four) times daily as needed for diarrhea or loose stools.  Yes Historical Provider, MD  Multiple Vitamins-Minerals (ICAPS AREDS 2 PO) Take 1 capsule by mouth 2 (two) times daily.    Yes Historical Provider, MD  pregabalin (LYRICA) 25 MG capsule Take 1 capsule (25 mg total) by mouth 2 (two) times daily. Patient taking differently: Take 25 mg by mouth 2 (two) times daily as needed (for neuropathy of the feet).  07/13/16  Yes Sela Hilding, MD  sertraline (ZOLOFT) 50 MG tablet Take 1 tablet (50 mg total) by mouth daily. 06/17/16  Yes Sela Hilding, MD  atorvastatin (LIPITOR) 40 MG tablet Take 1 tablet (40 mg total) by  mouth daily. Patient not taking: Reported on 07/30/2016 05/25/16   Sela Hilding, MD  metoprolol (LOPRESSOR) 50 MG tablet Take 1.5 tablets (75 mg total) by mouth 2 (two) times daily. 07/30/16 08/29/16  Lennice Sites, DO  omeprazole (PRILOSEC) 40 MG capsule Take 1 capsule (40 mg total) by mouth daily. Patient not taking: Reported on 07/30/2016 05/20/16   Manus Gunning, MD  Rivaroxaban (XARELTO) 15 MG TABS tablet Take 1 tablet (15 mg total) by mouth daily with supper. 07/30/16 08/29/16  Lennice Sites, DO    Family History Family History  Problem Relation Age of Onset  . Diabetes Mother   . Heart failure Father   . Breast cancer Sister   . Heart failure Sister   . Diabetes Son   . Diabetes Sister     Social History Social History  Substance Use Topics  . Smoking status: Never Smoker  . Smokeless tobacco: Never Used  . Alcohol use No     Allergies   Erythromycin; Sotalol; Augmentin [amoxicillin-pot clavulanate]; Crestor [rosuvastatin calcium]; Keflex [cephalexin]; and Septra [sulfamethoxazole-trimethoprim]   Review of Systems Review of Systems  Constitutional: Negative for chills and fever.  HENT: Negative for ear pain and sore throat.   Eyes: Negative for pain and visual disturbance.  Respiratory: Negative for cough and shortness of breath.   Cardiovascular: Positive for chest pain. Negative for palpitations.  Gastrointestinal: Negative for abdominal pain and vomiting.  Genitourinary: Negative for dysuria and hematuria.  Musculoskeletal: Positive for myalgias. Negative for arthralgias and back pain.  Skin: Negative for color change and rash.  Neurological: Positive for headaches. Negative for seizures and syncope.  All other systems reviewed and are negative.    Physical Exam Updated Vital Signs BP 136/76 (BP Location: Left Arm)   Pulse 88   Temp 97.7 F (36.5 C) (Oral)   Resp 16   Wt 53 kg   SpO2 100%   BMI 23.60 kg/m   Physical Exam  Constitutional: She is  oriented to person, place, and time. She appears well-developed and well-nourished. No distress.  HENT:  Head: Normocephalic and atraumatic.  Eyes: Conjunctivae are normal. Pupils are equal, round, and reactive to light.  Neck: Neck supple.  Cardiovascular: Normal rate, regular rhythm, normal heart sounds and intact distal pulses.   No murmur heard. Pulmonary/Chest: Effort normal and breath sounds normal. No respiratory distress.  Abdominal: Soft. There is no tenderness.  Musculoskeletal: She exhibits no edema.  Neurological: She is alert and oriented to person, place, and time. No cranial nerve deficit or sensory deficit. She exhibits normal muscle tone. Coordination normal.  5+/5 muscle strength, normal sensation, no drift  Skin: Skin is warm and dry.  Psychiatric: She has a normal mood and affect.  Nursing note and vitals reviewed.    ED Treatments / Results  Labs (all labs ordered are listed, but only abnormal results are  displayed) Labs Reviewed  BASIC METABOLIC PANEL - Abnormal; Notable for the following:       Result Value   Sodium 134 (*)    CO2 17 (*)    Glucose, Bld 137 (*)    BUN 27 (*)    Creatinine, Ser 1.51 (*)    Calcium 8.8 (*)    GFR calc non Af Amer 31 (*)    GFR calc Af Amer 36 (*)    All other components within normal limits  CBC WITH DIFFERENTIAL/PLATELET - Abnormal; Notable for the following:    RBC 3.32 (*)    Hemoglobin 11.1 (*)    HCT 31.7 (*)    All other components within normal limits  TSH  MAGNESIUM  I-STAT TROPOININ, ED    EKG  EKG Interpretation None     EKG: Sinus tachycardia at 133 bpm, otherwise normal intervals, no signs of ischemic changes or ST changes Repeat EKG: Normal sinus rhythm at 82 bpm with normal intervals and no signs of ischemic changes or ST changes  Radiology Ct Head Wo Contrast  Result Date: 07/30/2016 CLINICAL DATA:  Headache. EXAM: CT HEAD WITHOUT CONTRAST TECHNIQUE: Contiguous axial images were obtained from  the base of the skull through the vertex without intravenous contrast. COMPARISON:  None. FINDINGS: Brain: No subdural, epidural, or subarachnoid hemorrhage. There is prominence of ventricles and sulci, likely due to volume loss. Cerebellum, brainstem, and basal cisterns are normal. White matter changes are noted. No acute cortical ischemia or infarct identified. No mass effect or midline shift. Vascular: Calcified atherosclerosis of the intracranial carotid arteries is identified. Skull: Normal. Negative for fracture or focal lesion. Sinuses/Orbits: Opacification of the right sphenoid sinus is identified. There is opacification of the left posterior ethmoid sinus is well. Paranasal sinuses, mastoid air cells, and middle ears otherwise normal. Other: None. IMPRESSION: 1. No acute intracranial abnormalities to explain the patient's symptoms are identified. Electronically Signed   By: Dorise Bullion III M.D   On: 07/30/2016 19:39    Procedures Procedures (including critical care time)  Medications Ordered in ED Medications  sodium chloride 0.9 % bolus 1,000 mL (0 mLs Intravenous Stopped 07/30/16 2231)  Rivaroxaban (XARELTO) tablet 15 mg (15 mg Oral Given 07/30/16 2230)     Initial Impression / Assessment and Plan / ED Course  I have reviewed the triage vital signs and the nursing notes.  Pertinent labs & imaging results that were available during my care of the patient were reviewed by me and considered in my medical decision making (see chart for details).     Sheng Pritz is an 83 year old female with history of high cholesterol, V. tach status post ICD, hypertension, diabetes who presents to the ED with ICD firing. Patient's vitals at time of arrival to the ED are unremarkable and patient is w/o fever. Patient states that she was taking a shower and her ICD went off this morning but otherwise has no chest pain, no shortness of breath. Patient has been asymptomatic when ICD went off. ICD went  off again just prior to arrival and patient came for evaluation as she has headache and general body pain. Patient denies chest pain, shortness of breath, lightheadedness. Exam is overall unremarkable and patient is neurologically intact. Will get CBC, BMP, troponin, chest x-ray, EKG, head CT. Patient has Gamaliel and will interrogate. Patient had initial EKG performed that showed sinus tachycardia at 133 with normal intervals and no signs of ischemia. Repeat EKG showed normal sinus  rhythm at 82 bpm and no signs of ischemic changes. Patient ICD shocked twice for atrial fibrillation in the 190s according to Franquez. Patient has no history of atrial fibrillation and talked with cardiology about patient and they recommended starting patient on Xarelto and increasing in Toprol to 75 mg twice a day and they will follow-up the patient next week. Patient did have labs significant for mild elevation of troponin but likely secondary to defibrillation. Patient with CMP at baseline with creatinine mildly elevated. Patient given normal saline bolus. Patient with magnesium and TSH also within normal limits. Patient had an unremarkable head CT with no signs of acute intracranial abnormalities. Patient also had chest x-ray that showed no pneumonia, no pneumothorax, no widened mediastinum. Patient given instructions about use of Xarelto and risks and benefits. Patient given first dose of Xarelto in the ED and discharged from the ED in good condition. Told to return to ED if symptoms worsen and will follow up with cardiology.   I discussed patientw/ Dr. Reather Converse who supervised the care of this patient.   Final Clinical Impressions(s) / ED Diagnoses   Final diagnoses:  Malfunction of implantable cardioverter-defibrillator (ICD), initial encounter  Atrial fibrillation, unspecified type [facility])    New Prescriptions Discharge Medication List as of 07/30/2016 10:27 PM    START taking these medications     Details  Rivaroxaban (XARELTO) 15 MG TABS tablet Take 1 tablet (15 mg total) by mouth daily with supper., Starting Sat 07/30/2016, Until Mon 08/29/2016, Print         Khaya Theissen [place], DO 07/30/16 2321    Elnora Morrison, MD 08/01/16 [id number]

## 2016-07-30 NOTE — Discharge Instructions (Signed)
Follow up with cardiology next week. Return if symptoms worsen.

## 2016-07-30 NOTE — ED Notes (Signed)
Patient transported to CT 

## 2016-08-01 ENCOUNTER — Telehealth: Payer: Self-pay | Admitting: Cardiology

## 2016-08-01 NOTE — Telephone Encounter (Signed)
Spoke with pt regarding shock from 07/30/2016, pt stated that she had not taken her Lopressor 75mg  that day prior to first shock or after the first shock,  Pt stated that she did not know what was going on. Stressed to pt the importance of taking her medications everyday and at the same time. Pt stated that she has felt fine since Saturday. Informed pt that I would make Dr. Curt Bears aware of her receiving 2 shocks and would call back with recommendations. Pt voiced understanding.

## 2016-08-01 NOTE — Telephone Encounter (Signed)
Needs to come into clinic to discuss AF and therapy. Compliance with beta blockers Resean Brander help with shocks for fast AF.

## 2016-08-01 NOTE — Telephone Encounter (Signed)
New Message   pt verbalized that she is calling for rn   She went to the ED and want to speak to the rn   About her device   1. Has your device fired? yes  2. Is you device beeping? yes  3. Are you experiencing draining or swelling at device site? no  4. Are you calling to see if we received your device transmission? no  5. Have you passed out? no

## 2016-08-01 NOTE — Telephone Encounter (Signed)
Pt seen at ER on Jul 30, 2016. Was told to call office today. According to note she received therapy for Afib with a HR of 190. Informed pt that [facility] RN is currently w/ another pt. Pt requested a call back once RN is available.

## 2016-08-01 NOTE — Telephone Encounter (Signed)
Spoke with Brenda [name], she agreed to appointment with Dr. Ileana Ladd 08/03/2016 at 9:45am.

## 2016-08-02 ENCOUNTER — Telehealth (HOSPITAL_COMMUNITY): Payer: Self-pay | Admitting: *Deleted

## 2016-08-02 ENCOUNTER — Encounter (HOSPITAL_COMMUNITY): Payer: Self-pay | Admitting: *Deleted

## 2016-08-02 ENCOUNTER — Encounter: Payer: Self-pay | Admitting: Cardiology

## 2016-08-02 NOTE — Telephone Encounter (Signed)
Pt on afib ED follow up report, pt has appt scheduled with Dr. Curt Bears 08/03/2016.  NO follow up to afib clinic needed at this time.

## 2016-08-03 ENCOUNTER — Encounter: Payer: Self-pay | Admitting: Cardiology

## 2016-08-03 ENCOUNTER — Ambulatory Visit (INDEPENDENT_AMBULATORY_CARE_PROVIDER_SITE_OTHER): Payer: Medicare Other | Admitting: Cardiology

## 2016-08-03 VITALS — BP 138/72 | HR 70 | Ht 61.0 in | Wt 123.4 lb

## 2016-08-03 DIAGNOSIS — Z95810 Presence of automatic (implantable) cardiac defibrillator: Secondary | ICD-10-CM

## 2016-08-03 DIAGNOSIS — I472 Ventricular tachycardia, unspecified: Secondary | ICD-10-CM

## 2016-08-03 DIAGNOSIS — I10 Essential (primary) hypertension: Secondary | ICD-10-CM

## 2016-08-03 DIAGNOSIS — I480 Paroxysmal atrial fibrillation: Secondary | ICD-10-CM

## 2016-08-03 LAB — CUP PACEART INCLINIC DEVICE CHECK
Brady Statistic RA Percent Paced: 71 %
Brady Statistic RV Percent Paced: 1 % — CL
Date Time Interrogation Session: 20180509040000
HighPow Impedance: 41 Ohm
HighPow Impedance: 46 Ohm
Implantable Lead Implant Date: 20111110
Implantable Lead Implant Date: 20111110
Implantable Lead Location: 753859
Implantable Lead Location: 753860
Implantable Lead Model: 157
Implantable Lead Model: 4135
Implantable Lead Serial Number: 28803817
Implantable Lead Serial Number: 302861
Implantable Pulse Generator Implant Date: 20111110
Lead Channel Impedance Value: 450 Ohm
Lead Channel Impedance Value: 677 Ohm
Lead Channel Pacing Threshold Amplitude: 0.5 V
Lead Channel Pacing Threshold Amplitude: 0.9 V
Lead Channel Pacing Threshold Pulse Width: 0.4 ms
Lead Channel Pacing Threshold Pulse Width: 0.4 ms
Lead Channel Sensing Intrinsic Amplitude: 10.8 mV
Lead Channel Sensing Intrinsic Amplitude: 3 mV
Lead Channel Setting Pacing Amplitude: 2 V
Lead Channel Setting Pacing Amplitude: 2.4 V
Lead Channel Setting Pacing Pulse Width: 0.4 ms
Lead Channel Setting Sensing Sensitivity: 0.6 mV
Pulse Gen Serial Number: 169112

## 2016-08-03 NOTE — Patient Instructions (Signed)
Medication Instructions:    Your physician recommends that you continue on your current medications as directed. Please refer to the Current Medication list given to you today.  - If you need a refill on your cardiac medications before your next appointment, please call your pharmacy.   Labwork:  None ordered  Testing/Procedures:  None ordered  Follow-Up:  Your physician recommends that you schedule a follow-up appointment in: 3 months with Dr. Camnitz.  Thank you for choosing [facility]!!   Cordera Stineman, RN [phone]         

## 2016-08-03 NOTE — Progress Notes (Signed)
Electrophysiology Office Note   Date:  08/03/2016   ID:  Brenda [name], DOB 08-07-1931, MRN [id number]  PCP:  Brenda Hilding, MD  Primary Electrophysiologist:  Wayne Brunker Meredith Leeds, MD    Chief Complaint  Patient presents with  . Defib Check    VT/ICD Shock     History of Present Illness: Brenda [name] is a 83 y.o. female who presents today for electrophysiology evaluation.   She has a history of CVA, diabetes, and hypertension. She has a defibrillator in place, with note suggesting that it was due to history of ventricular tachycardia. She had an echo done in November 2017 that showed a preserved ejection fraction of 60%, moderate tricuspid regurgitation, and a severely enlarged left atrium.She says that she has been feeling well other than some chest pain. She attributes the chest pain to reflux from her hiatal hernia. She says that the chest pain is worsened when she eats and improved when she takes Zantac. There is no relation to exertion with her chest pain.  According to her family, she was started on sotalol a few years ago. On the sotalol, she had, what her daughter calls seizures. They also describe that she had a flat line on her EKG. She had an ICD placed at that time. Sotalol was thus stopped.   She presented to the emergency room on 07/30/16 after multiple ICD shocks. She says that that day, she felt very fatigued and short of breath. She was not able to move around her house, and had difficulty even taking one step. On presentation to the emergency room, her device was interrogated which showed atrial fibrillation with rapid rates into the 190s. She previously had episodes of diarrhea and had stopped taking her metoprolol. Since then, she has remained in sinus rhythm. Her ICD shocks did convert her to sinus rhythm. She has had episodes of fatigue for the week prior to her emergency room visit. In the emergency room, she has been started on metoprolol 75 mg twice a day  and Xarelto. She does endorse a great deal of anxiety over her ICD shocks.   Past Medical History:  Diagnosis Date  . Anemia   . Arthritis   . Basal cell carcinoma    nose  . Diabetes mellitus without complication ([place])    pt denies diabetes  . GERD (gastroesophageal reflux disease)   . Hiatal hernia   . HLD (hyperlipidemia)   . Hypertension   . Seizure ([place]) 2011   Past Surgical History:  Procedure Laterality Date  . CARDIAC DEFIBRILLATOR PLACEMENT    . CATARACT EXTRACTION Left 02/2001  . CHOLECYSTECTOMY    . LUMBAR LAMINECTOMY  06/11/2001  . LUMBAR LAMINECTOMY  02/09/2006  . LUMBAR LAMINECTOMY  07/07/2006   with spianal cord stimulator  . SPINAL CORD STIMULATOR REMOVAL  14/2009  . TUBAL LIGATION       Current Outpatient Prescriptions  Medication Sig Dispense Refill  . aspirin EC 81 MG tablet Take 1 tablet (81 mg total) by mouth daily. Take only with food. 30 tablet 3  . atorvastatin (LIPITOR) 40 MG tablet Take 1 tablet (40 mg total) by mouth daily. 90 tablet 3  . Cholecalciferol (VITAMIN D3) 5000 units CAPS Take 5,000 Units by mouth daily.    . cyanocobalamin (,VITAMIN B-12,) 1000 MCG/ML injection Inject 1 mL (1,000 mcg total) into the skin once a week. 12 mL 0  . denosumab (PROLIA) 60 MG/ML SOLN injection Inject 60 mg into the skin every 6 (six)  months. Administer in upper arm, thigh, or abdomen    . lisinopril (PRINIVIL,ZESTRIL) 10 MG tablet Take 1 tablet (10 mg total) by mouth daily. 90 tablet 1  . loperamide (IMODIUM A-D) 2 MG tablet Take 2 mg by mouth 4 (four) times daily as needed for diarrhea or loose stools.    . metoprolol (LOPRESSOR) 50 MG tablet Take 1.5 tablets (75 mg total) by mouth 2 (two) times daily. 90 tablet 0  . Multiple Vitamins-Minerals (ICAPS AREDS 2 PO) Take 1 capsule by mouth 2 (two) times daily.     Marland Kitchen omeprazole (PRILOSEC) 40 MG capsule Take 1 capsule (40 mg total) by mouth daily. 90 capsule 1  . pregabalin (LYRICA) 25 MG capsule Take 1 capsule  (25 mg total) by mouth 2 (two) times daily. (Patient taking differently: Take 25 mg by mouth 2 (two) times daily as needed (for neuropathy of the feet). ) 60 capsule 0  . Rivaroxaban (XARELTO) 15 MG TABS tablet Take 1 tablet (15 mg total) by mouth daily with supper. 30 tablet 0  . sertraline (ZOLOFT) 50 MG tablet Take 1 tablet (50 mg total) by mouth daily. 30 tablet 2   Current Facility-Administered Medications  Medication Dose Route Frequency Provider Last Rate Last Dose  . 0.9 %  sodium chloride infusion  500 mL Intravenous Continuous Armbruster, Renelda Loma, MD        Allergies:   Erythromycin; Sotalol; Augmentin [amoxicillin-pot clavulanate]; Crestor [rosuvastatin calcium]; Keflex [cephalexin]; and Septra [sulfamethoxazole-trimethoprim]   Social History:  The patient  reports that she has never smoked. She has never used smokeless tobacco. She reports that she does not drink alcohol or use drugs.   Family History:  The patient's family history includes Breast cancer in her sister; Diabetes in her mother, sister, and son; Heart failure in her father and sister.    ROS:  Please see the history of present illness.   Otherwise, review of systems is positive for dyspnea on exertion, diarrhea, back pain, muscle pain, easy bruising.   All other systems are reviewed and negative.    PHYSICAL EXAM: VS:  BP 138/72   Pulse 70   Ht 5\' 1"  (1.549 m)   Wt 123 lb 6.4 oz (56 kg)   BMI 23.32 kg/m  , BMI Body mass index is 23.32 kg/m. GEN: Well nourished, well developed, in no acute distress  HEENT: normal  Neck: no JVD, carotid bruits, or masses Cardiac: RRR; no murmurs, rubs, or gallops,no edema  Respiratory:  clear to auscultation bilaterally, normal work of breathing GI: soft, nontender, nondistended, + BS MS: no deformity or atrophy  Skin: warm and dry, device site well healed Neuro:  Strength and sensation are intact Psych: euthymic mood, full affect  EKG:  EKG is ordered  today. Personal review of the ekg ordered shows atrially paced, minimal voltage criteria for LVH   Device interrogation is reviewed today in detail.  See PaceArt for details.   Recent Labs: 05/05/2016: ALT 8 07/30/2016: BUN 27; Creatinine, Ser 1.51; Hemoglobin 11.1; Magnesium 1.9; Platelets 168; Potassium 3.9; Sodium 134; TSH 3.643    Lipid Panel  No results found for: CHOL, TRIG, HDL, CHOLHDL, VLDL, LDLCALC, LDLDIRECT   Wt Readings from Last 3 Encounters:  08/03/16 123 lb 6.4 oz (56 kg)  07/30/16 116 lb 13.5 oz (53 kg)  05/25/16 117 lb 12.8 oz (53.4 kg)      Other studies Reviewed: Additional studies/ records that were reviewed today include: PCP notes  TTE 01/28/16 -  Outside records reviewed EF 65%, LA moderately enlarged  ASSESSMENT AND PLAN:  1.  Hypertension: Well-controlled today. Continue current management.  2. ICD in place: Per records, was implanted for ventricular tachycardia. He did receive ICD shocks for inappropriate atrial fibrillation. Was put on 75 mg of metoprolol twice a day. Continue current management.  3. Paroxysmal atrial fibrillation: Currently on Xarelto. Did receive ICD shocks for atrial fibrillation with rapid rates, though was not on any rate controlling medications and stopped them due to diarrhea. Was started on 75 mg of metoprolol in the emergency room. We'll continue these medications at their current doses.  This patients CHA2DS2-VASc Score and unadjusted Ischemic Stroke Rate (% per year) is equal to 7.2 % stroke rate/year from a score of 5  Above score calculated as 1 point each if present [CHF, HTN, DM, Vascular=MI/PAD/Aortic Plaque, Age if 65-74, or Female] Above score calculated as 2 points each if present [Age > 75, or Stroke/TIA/TE]     Current medicines are reviewed at length with the patient today.   The patient does not have concerns regarding her medicines.  The following changes were made today:  none  Labs/ tests ordered today  include:  Orders Placed This Encounter  Procedures  . EKG 12-Lead     Disposition:   FU with Jonanthony Nahar 3 months  Signed, Lexie Koehl Meredith Leeds, MD  08/03/2016 10:02 AM     [facility] [address] [place] [address] [phone] (office) [phone] (fax)

## 2016-08-05 ENCOUNTER — Ambulatory Visit: Payer: Medicare Other | Admitting: Family Medicine

## 2016-08-05 ENCOUNTER — Ambulatory Visit (INDEPENDENT_AMBULATORY_CARE_PROVIDER_SITE_OTHER): Payer: Medicare Other | Admitting: Internal Medicine

## 2016-08-05 VITALS — BP 130/62 | HR 83 | Temp 97.3°F | Wt 123.6 lb

## 2016-08-05 DIAGNOSIS — I10 Essential (primary) hypertension: Secondary | ICD-10-CM | POA: Diagnosis present

## 2016-08-05 DIAGNOSIS — I480 Paroxysmal atrial fibrillation: Secondary | ICD-10-CM | POA: Diagnosis not present

## 2016-08-05 NOTE — Patient Instructions (Signed)
No changes need to be made to your medications today. I'm glad you're feeling better. Please follow up with Cardiology in 3 months. Please follow up with Dr. Lindell Noe for management of your chronic medical conditions.   Take Care,   Dr. Juleen China

## 2016-08-05 NOTE — Assessment & Plan Note (Signed)
Currently in NSR and rate controlled with metoprolol. Continue with Xarelto and follow up with cardiology in 3 months at already scheduled visit.

## 2016-08-05 NOTE — Progress Notes (Signed)
   Subjective:    Brenda [name] - 83 y.o. female MRN [id number]  Date of birth: 08-07-1931  HPI  Brenda [name] is here for ER follow-up for visit on 07/30/16 for multiple ICD shocks. Upon interrogation of ICD that day, was found to have Afib with RVR into the 190s. Her ICD shocks had converted her to NSR. On day of ED visit, she was feeling very fatigued and SOB at rest. Those symptoms have since resolved. She was re-started on Metoprolol at ED visit (had self discontinued medication due to diarrhea) as well as Xarelto. She was seen by Cardiology on 5/9 and no changes were made to medications. She denies any further ICD shocks since ED visit. Reports compliance with medications. No sensation of heart racing or skipping beats. No chest pain. Does endorse chronic DOE. Denies LE edema and lightheadedness. No blood in stools, nose bleeds, or GI distress.    -  reports that she has never smoked. She has never used smokeless tobacco. - Review of Systems: Per HPI. - Past Medical History: Patient Active Problem List   Diagnosis Date Noted  . Paroxysmal A-fib ([place]) 08/05/2016  . AKI (acute kidney injury) ([place]) 05/25/2016  . Essential hypertension 05/05/2016  . Type 2 diabetes mellitus without complication, without long-term current use of insulin ([place]) 05/05/2016  . Hiatal hernia 05/05/2016  . History of CVA (cerebrovascular accident) without residual deficits 05/05/2016  . Memory change 05/05/2016  . AICD (automatic cardioverter/defibrillator) present 05/05/2016  . Back pain 05/05/2016   - Medications: reviewed and updated   Objective:   Physical Exam BP 130/62   Pulse 83   Temp 97.3 F (36.3 C)   Wt 123 lb 9.6 oz (56.1 kg)   SpO2 99%   BMI 23.35 kg/m  Gen: NAD, alert, cooperative with exam, well-appearing CV: RRR, good S1/S2, no murmur, no LE edema Resp: CTABL, no wheezes, non-labored Neuro: no gross deficits. Walks with assistance of walker.  Psych: good insight, alert and  oriented, answers questions appropriately     Assessment & Plan:   Essential hypertension Well controlled today. Did note that patient had elevation in Cr on BMET at ED visit, although appears fairly consistent with previous lab values. Continue current medication regimen. Do not feel that dose adjustment of Lisinopril is necessary given eGFR in 30s and Lisinopril fairly low dose at 10 mg.   Paroxysmal A-fib ([facility]) Currently in NSR and rate controlled with metoprolol. Continue with Xarelto and follow up with cardiology in 3 months at already scheduled visit.   Reccommended f/u with PCP for health maitenance and chronic medical conditions.   Phill Myron, D.O. 08/05/2016, 12:20 PM PGY-2, [place]

## 2016-08-05 NOTE — Assessment & Plan Note (Signed)
Well controlled today. Did note that patient had elevation in Cr on BMET at ED visit, although appears fairly consistent with previous lab values. Continue current medication regimen. Do not feel that dose adjustment of Lisinopril is necessary given eGFR in 30s and Lisinopril fairly low dose at 10 mg.

## 2016-08-08 ENCOUNTER — Ambulatory Visit (INDEPENDENT_AMBULATORY_CARE_PROVIDER_SITE_OTHER): Payer: Medicare Other | Admitting: *Deleted

## 2016-08-08 ENCOUNTER — Telehealth: Payer: Self-pay | Admitting: Cardiology

## 2016-08-08 DIAGNOSIS — I472 Ventricular tachycardia, unspecified: Secondary | ICD-10-CM

## 2016-08-08 NOTE — Telephone Encounter (Signed)
Spoke with pt and reminded pt of remote transmission that is due today. Pt verbalized understanding.   

## 2016-08-08 NOTE — Progress Notes (Signed)
Remote ICD transmission.   

## 2016-08-09 ENCOUNTER — Encounter: Payer: Self-pay | Admitting: Cardiology

## 2016-08-10 ENCOUNTER — Telehealth: Payer: Self-pay

## 2016-08-10 LAB — CUP PACEART REMOTE DEVICE CHECK
Battery Remaining Longevity: 54 mo
Battery Remaining Percentage: 61 %
Brady Statistic RA Percent Paced: 98 %
Brady Statistic RV Percent Paced: 0 %
Date Time Interrogation Session: 20180514165700
HighPow Impedance: 46 Ohm
Implantable Lead Implant Date: 20111110
Implantable Lead Implant Date: 20111110
Implantable Lead Location: 753859
Implantable Lead Location: 753860
Implantable Lead Model: 157
Implantable Lead Model: 4135
Implantable Lead Serial Number: 28803817
Implantable Lead Serial Number: 302861
Implantable Pulse Generator Implant Date: 20111110
Lead Channel Impedance Value: 468 Ohm
Lead Channel Impedance Value: 682 Ohm
Lead Channel Pacing Threshold Amplitude: 0.5 V
Lead Channel Pacing Threshold Amplitude: 0.9 V
Lead Channel Pacing Threshold Pulse Width: 0.4 ms
Lead Channel Pacing Threshold Pulse Width: 0.4 ms
Lead Channel Setting Pacing Amplitude: 2 V
Lead Channel Setting Pacing Amplitude: 2.4 V
Lead Channel Setting Pacing Pulse Width: 0.4 ms
Lead Channel Setting Sensing Sensitivity: 0.6 mV
Pulse Gen Serial Number: 169112

## 2016-08-10 MED ORDER — RIVAROXABAN 15 MG PO TABS: 15.0000 mg | ORAL_TABLET | Freq: Every day | ORAL | 1 refills | Status: DC

## 2016-08-10 MED ORDER — METOPROLOL TARTRATE 100 MG PO TABS: 100.0000 mg | ORAL_TABLET | Freq: Two times a day (BID) | ORAL | 3 refills | Status: DC

## 2016-08-10 NOTE — Addendum Note (Signed)
Addended by: Erskine Emery on: 08/10/2016 10:13 AM   Modules accepted: Orders

## 2016-08-10 NOTE — Telephone Encounter (Signed)
Spoke with patient and explained that Dr. Curt Bears had reviewed her remote transmission which showed some fast heart rates. I explained that he was going to increase her metoprolol to 100mg  BID. She verbalized understanding. She additionally stated that hse was running out of her xarelto. I told her I would forward this on to the refills department. She verbalized understanding.

## 2016-08-10 NOTE — Telephone Encounter (Signed)
Xarelto refills sent as requested.

## 2016-09-23 ENCOUNTER — Encounter: Payer: Self-pay | Admitting: Family Medicine

## 2016-09-23 ENCOUNTER — Ambulatory Visit (INDEPENDENT_AMBULATORY_CARE_PROVIDER_SITE_OTHER): Payer: Medicare Other | Admitting: Family Medicine

## 2016-09-23 VITALS — BP 128/62 | HR 71 | Temp 97.9°F | Ht 61.0 in | Wt 124.4 lb

## 2016-09-23 DIAGNOSIS — G8929 Other chronic pain: Secondary | ICD-10-CM | POA: Diagnosis not present

## 2016-09-23 DIAGNOSIS — H900 Conductive hearing loss, bilateral: Secondary | ICD-10-CM | POA: Diagnosis not present

## 2016-09-23 DIAGNOSIS — M545 Other chronic pain: Secondary | ICD-10-CM

## 2016-09-23 NOTE — Progress Notes (Signed)
   CC: back pain   HPI Back pain - lumbar region, feels like a nerve is pinching, has a history of ~5 surgeries. Thinks it is getting worse. No changes in bowel or bladder habits. No new weakness. Has done PT in the past. Doesn't take any medicine for her back pain. No topicals tried. Trying to restart exercises. These helped in the past. She would like to know if she will ever walk again, she is very tired of using the walker. She tires with walking even with the walker.     Hearing - not hearing well even with hearing aids. Doesn't hear well with them in.   She is concerned about whether she should continue taking PPI as prescribed by GI specialist, as well as whether she should continue taking metoprolol as prescribed by cardiology during her recent ED visit for ICD shock. I reassured her that she should continue taking both of these medicines.  As per our discussion at last visit, she and her husband brought in a large box of medicines for [place] to dispose of, including Norco.  CC, SH/smoking status, and VS noted  Objective: BP 128/62   Pulse 71   Temp 97.9 F (36.6 C) (Oral)   Ht 5\' 1"  (1.549 m)   Wt 124 lb 6.4 oz (56.4 kg)   SpO2 99%   BMI 23.51 kg/m  Gen: NAD, alert, cooperative, and pleasant. HEENT: NCAT, EOMI, PERRL CV: RRR, no murmur Resp: CTAB, no wheezes, non-labored Abd: SNTND, BS present, no guarding or organomegaly Ext: No edema, warm Neuro: Alert and oriented, Speech clear, No gross deficits. R leg 4/5 strength in thigh and lower leg, L leg 5/5. Sensation intact.   Assessment and plan:  Back pain Not taking any medicine for this. Has history of extensive back surgeries. No concerning findings on exam for radiculopathy or impingement. Patient is bothered by chronic pain and chronic weakness. When asked what she would like to do, she reports that she would like to try physical therapy again. Referred to gait training, could consider referral to spine specialist should  gait training not be helpful. Counseled her that she may need to use a walker forever.  Hearing Loss Patient has bilateral hearing aids, she thinks these are need to be adjusted. She cannot hear even when these are in. Referred to audiology.  Orders Placed This Encounter  Procedures  . Ambulatory referral to Audiology    Referral Priority:   Routine    Referral Type:   Audiology Exam    Referral Reason:   Specialty Services Required    Number of Visits Requested:   1  . PT gait training    Standing Status:   Future    Standing Expiration Date:   09/23/2017   Health Maintenance: Given age and concomitant weakness, feel DEXA scan would not have significant benefit. Adjusted her health maintenance to reflect this and the fact that her A1c was normal, does not need diabetic screening.  Ralene Ok, MD, PGY1 09/23/2016 4:32 PM

## 2016-09-23 NOTE — Assessment & Plan Note (Signed)
Not taking any medicine for this. Has history of extensive back surgeries. No concerning findings on exam for radiculopathy or impingement. Patient is bothered by chronic pain and chronic weakness. When asked what she would like to do, she reports that she would like to try physical therapy again. Referred to gait training, could consider referral to spine specialist should gait training not be helpful. Counseled her that she may need to use a walker forever.

## 2016-09-23 NOTE — Patient Instructions (Addendum)
It was a pleasure to see you today! Thank you for choosing [facility] for your primary care. Brenda [name] was seen for back pain, hearing.   Our plans for today were:  Continue your new heart medicine  PT will call you with an appointment  The hearing aid center will also call you.   You should return to our clinic to see Dr. Lindell Noe in 6 months for annual physical.   Best,  Dr. Lindell Noe

## 2016-10-26 ENCOUNTER — Ambulatory Visit (INDEPENDENT_AMBULATORY_CARE_PROVIDER_SITE_OTHER): Payer: Medicare Other | Admitting: Family Medicine

## 2016-10-26 ENCOUNTER — Encounter: Payer: Self-pay | Admitting: Family Medicine

## 2016-10-26 VITALS — BP 138/70 | HR 64 | Temp 97.6°F | Ht 61.0 in | Wt 129.0 lb

## 2016-10-26 DIAGNOSIS — N3000 Acute cystitis without hematuria: Secondary | ICD-10-CM

## 2016-10-26 DIAGNOSIS — C44311 Basal cell carcinoma of skin of nose: Secondary | ICD-10-CM

## 2016-10-26 DIAGNOSIS — N390 Urinary tract infection, site not specified: Secondary | ICD-10-CM | POA: Insufficient documentation

## 2016-10-26 DIAGNOSIS — R300 Dysuria: Secondary | ICD-10-CM | POA: Diagnosis not present

## 2016-10-26 LAB — POCT URINALYSIS DIP (MANUAL ENTRY)
Bilirubin, UA: NEGATIVE
Blood, UA: NEGATIVE
Glucose, UA: NEGATIVE mg/dL
Ketones, POC UA: NEGATIVE mg/dL
Nitrite, UA: NEGATIVE
Protein Ur, POC: NEGATIVE mg/dL
Spec Grav, UA: 1.01 (ref 1.010–1.025)
Urobilinogen, UA: 0.2 E.U./dL
pH, UA: 6.5 (ref 5.0–8.0)

## 2016-10-26 MED ORDER — CIPROFLOXACIN HCL 250 MG PO TABS: 250.0000 mg | ORAL_TABLET | Freq: Two times a day (BID) | ORAL | 0 refills | Status: DC

## 2016-10-26 NOTE — Patient Instructions (Signed)
It was a pleasure to see you today! Thank you for choosing [facility] for your primary care. Brenda [name] was seen for urinary issues.   Our plans for today were:  Take the antibiotic I sent, and call us if you don't feel any better in 3 days.   Continue the premarin.   The dermatology office will call you for an appt.   You should return to our clinic to see 6 months in Dr. Lindell Noe for chronic issues.   Best,  Dr. Lindell Noe

## 2016-10-26 NOTE — Assessment & Plan Note (Signed)
New, dysuria, which the patient suggests feels like her past UTIs. Dipstick with only leukocytes, but clinical suspicion for UTI high. Patient notes a history of recurrent UTIs, however she has several antibiotic allergies. Considered nitrofurantoin, however this is not recommended with her creatinine clearance of 28. She notes that Cipro has worked for her in the past, although I do not have this culture data since it was obtained in [place]. Will empirically treat with Cipro, urine culture sent.

## 2016-10-26 NOTE — Progress Notes (Signed)
   CC: dysuria  HPI Urinary symptoms - Notes burning with urination since 4 days ago. Able to give a small urine sample today. Burning after she pees. Has been cleaning with warm water, then vaseline, other times uses premarin. Premarin is old rx from prior to her move here from [place]. No fevers. She thinks she has UTIs every 6 months ago. This feels like a normal UTI for her. Cipro has worked for her in the past. She has several antibiotic allergies.   CC, SH/smoking status, and VS noted  Objective: BP 138/70   Pulse 64   Temp 97.6 F (36.4 C) (Oral)   Ht 5\' 1"  (1.549 m)   Wt 129 lb (58.5 kg)   SpO2 99%   BMI 24.37 kg/m  Gen: NAD, alert, cooperative, and pleasant. HEENT: NCAT, EOMI, PERRL CV: RRR, no murmur Resp: CTAB, no wheezes, non-labored Abd: soft, nondistended, TTP suprapubic region, BS present, no guarding or organomegaly GU: thin labial tissue with erythema, no vaginal discharge Ext: No edema, warm Neuro: Alert and oriented, Speech clear, No gross deficits  Assessment and plan:  UTI (urinary tract infection) New, dysuria, which the patient suggests feels like her past UTIs. Dipstick with only leukocytes, but clinical suspicion for UTI high. Patient notes a history of recurrent UTIs, however she has several antibiotic allergies. Considered nitrofurantoin, however this is not recommended with her creatinine clearance of 28. She notes that Cipro has worked for her in the past, although I do not have this culture data since it was obtained in [place]. Will empirically treat with Cipro, urine culture sent.  Orders Placed This Encounter  Procedures  . Urine Culture  . Ambulatory referral to Dermatology    Referral Priority:   Routine    Referral Type:   Consultation    Referral Reason:   Specialty Services Required    Requested Specialty:   Dermatology    Number of Visits Requested:   1  . POCT urinalysis dipstick   Meds ordered this encounter  Medications  .  ciprofloxacin (CIPRO) 250 MG tablet    Sig: Take 1 tablet (250 mg total) by mouth 2 (two) times daily.    Dispense:  6 tablet    Refill:  0    MOST form - DNR, full treatment for medical interventions (transfer), trial of artificial nutrition.   Ralene Ok, MD, PGY2 10/26/2016 3:05 PM

## 2016-10-28 LAB — URINE CULTURE

## 2016-10-31 ENCOUNTER — Telehealth: Payer: Self-pay | Admitting: Family Medicine

## 2016-10-31 NOTE — Telephone Encounter (Signed)
Pt states the pharmacy didn't have her antibiotic. I called over to [facility] and they do have it. Pt's husband will pick up today. Pt wasn't feeling any better and hopes antibiotic will help. ep

## 2016-11-03 ENCOUNTER — Encounter: Payer: Self-pay | Admitting: Cardiology

## 2016-11-03 ENCOUNTER — Ambulatory Visit (INDEPENDENT_AMBULATORY_CARE_PROVIDER_SITE_OTHER): Payer: Medicare Other | Admitting: Cardiology

## 2016-11-03 VITALS — BP 120/60 | HR 78 | Ht 61.0 in | Wt 130.2 lb

## 2016-11-03 DIAGNOSIS — I472 Ventricular tachycardia, unspecified: Secondary | ICD-10-CM

## 2016-11-03 DIAGNOSIS — I10 Essential (primary) hypertension: Secondary | ICD-10-CM

## 2016-11-03 DIAGNOSIS — I480 Paroxysmal atrial fibrillation: Secondary | ICD-10-CM | POA: Diagnosis not present

## 2016-11-03 LAB — CUP PACEART INCLINIC DEVICE CHECK
Date Time Interrogation Session: 20180809040000
HighPow Impedance: 41 Ohm
HighPow Impedance: 48 Ohm
Implantable Lead Implant Date: 20111110
Implantable Lead Implant Date: 20111110
Implantable Lead Location: 753859
Implantable Lead Location: 753860
Implantable Lead Model: 157
Implantable Lead Model: 4135
Implantable Lead Serial Number: 28803817
Implantable Lead Serial Number: 302861
Implantable Pulse Generator Implant Date: 20111110
Lead Channel Impedance Value: 431 Ohm
Lead Channel Impedance Value: 702 Ohm
Lead Channel Pacing Threshold Amplitude: 0.4 V
Lead Channel Pacing Threshold Amplitude: 1 V
Lead Channel Pacing Threshold Pulse Width: 0.4 ms
Lead Channel Pacing Threshold Pulse Width: 0.4 ms
Lead Channel Sensing Intrinsic Amplitude: 11.3 mV
Lead Channel Sensing Intrinsic Amplitude: 2.7 mV
Lead Channel Setting Pacing Amplitude: 2 V
Lead Channel Setting Pacing Amplitude: 2.4 V
Lead Channel Setting Pacing Pulse Width: 0.4 ms
Lead Channel Setting Sensing Sensitivity: 0.6 mV
Pulse Gen Serial Number: 169112

## 2016-11-03 NOTE — Progress Notes (Signed)
Electrophysiology Office Note   Date:  11/03/2016   ID:  Brenda [name], DOB 09-12-1931, MRN [id number]  PCP:  Sela Hilding, MD  Primary Electrophysiologist:  Trinitee Horgan Meredith Leeds, MD    Chief Complaint  Patient presents with  . Defib Check    VT/PAF     History of Present Illness: Brenda [name] is a 83 y.o. female who presents today for electrophysiology evaluation.   She has a history of CVA, diabetes, and hypertension. She has a defibrillator in place, with note suggesting that it was due to history of ventricular tachycardia. She had an echo done in November 2017 that showed a preserved ejection fraction of 60%, moderate tricuspid regurgitation, and a severely enlarged left atrium.  According to her family, she was started on sotalol a few years ago. On the sotalol, she had, what her daughter calls seizures. They also describe that she had a flat line on her EKG. She had an ICD placed at that time. Sotalol was thus stopped.   She presented to the emergency room on 07/30/16 after multiple ICD shocks. She says that that day, she felt very fatigued and short of breath. She was not able to move around her house, and had difficulty even taking one step. On presentation to the emergency room, her device was interrogated which showed atrial fibrillation with rapid rates into the 190s.   Today, denies symptoms of palpitations, chest pain, orthopnea, PND, lower extremity edema, claudication, dizziness, presyncope, syncope, bleeding, or neurologic sequela. The patient is tolerating medications without difficulties and is otherwise without complaint today. Her main complaint today is of fatigue. She is also been getting short of breath with activity. When she walks from the car to her house, she has to stop and rest. Her husband feel that this is due to a lack of activity and deconditioning.  Past Medical History:  Diagnosis Date  . Anemia   . Arthritis   . Basal cell carcinoma      nose  . Diabetes mellitus without complication ([place])    pt denies diabetes  . GERD (gastroesophageal reflux disease)   . Hiatal hernia   . HLD (hyperlipidemia)   . Hypertension   . Seizure ([place]) 2011   Past Surgical History:  Procedure Laterality Date  . CARDIAC DEFIBRILLATOR PLACEMENT    . CATARACT EXTRACTION Left 02/2001  . CHOLECYSTECTOMY    . LUMBAR LAMINECTOMY  06/11/2001  . LUMBAR LAMINECTOMY  02/09/2006  . LUMBAR LAMINECTOMY  07/07/2006   with spianal cord stimulator  . SPINAL CORD STIMULATOR REMOVAL  14/2009  . TUBAL LIGATION       Current Outpatient Prescriptions  Medication Sig Dispense Refill  . aspirin EC 81 MG tablet Take 1 tablet (81 mg total) by mouth daily. Take only with food. 30 tablet 3  . atorvastatin (LIPITOR) 40 MG tablet Take 1 tablet (40 mg total) by mouth daily. 90 tablet 3  . Cholecalciferol (VITAMIN D3) 5000 units CAPS Take 5,000 Units by mouth daily.    . ciprofloxacin (CIPRO) 250 MG tablet Take 1 tablet (250 mg total) by mouth 2 (two) times daily. 6 tablet 0  . cyanocobalamin (,VITAMIN B-12,) 1000 MCG/ML injection Inject 1 mL (1,000 mcg total) into the skin once a week. 12 mL 0  . denosumab (PROLIA) 60 MG/ML SOLN injection Inject 60 mg into the skin every 6 (six) months. Administer in upper arm, thigh, or abdomen    . lisinopril (PRINIVIL,ZESTRIL) 10 MG tablet Take 1 tablet (10  mg total) by mouth daily. 90 tablet 1  . loperamide (IMODIUM A-D) 2 MG tablet Take 2 mg by mouth 4 (four) times daily as needed for diarrhea or loose stools.    . metoprolol tartrate (LOPRESSOR) 100 MG tablet Take 1 tablet (100 mg total) by mouth 2 (two) times daily. 180 tablet 3  . Multiple Vitamins-Minerals (ICAPS AREDS 2 PO) Take 1 capsule by mouth 2 (two) times daily.     Marland Kitchen omeprazole (PRILOSEC) 40 MG capsule Take 1 capsule (40 mg total) by mouth daily. 90 capsule 1  . pregabalin (LYRICA) 25 MG capsule Take 1 capsule (25 mg total) by mouth 2 (two) times daily. (Patient  taking differently: Take 25 mg by mouth 2 (two) times daily as needed (for neuropathy of the feet). ) 60 capsule 0  . sertraline (ZOLOFT) 50 MG tablet Take 1 tablet (50 mg total) by mouth daily. 30 tablet 2  . Rivaroxaban (XARELTO) 15 MG TABS tablet Take 1 tablet (15 mg total) by mouth daily with supper. 90 tablet 1   Current Facility-Administered Medications  Medication Dose Route Frequency Provider Last Rate Last Dose  . 0.9 %  sodium chloride infusion  500 mL Intravenous Continuous Armbruster, Renelda Loma, MD        Allergies:   Erythromycin; Sotalol; Augmentin [amoxicillin-pot clavulanate]; Crestor [rosuvastatin calcium]; Keflex [cephalexin]; and Septra [sulfamethoxazole-trimethoprim]   Social History:  The patient  reports that she has never smoked. She has never used smokeless tobacco. She reports that she does not drink alcohol or use drugs.   Family History:  The patient's family history includes Breast cancer in her sister; Diabetes in her mother, sister, and son; Heart failure in her father and sister.    ROS:  Please see the history of present illness.   Otherwise, review of systems is positive for fatigue, SOB.   All other systems are reviewed and negative.   PHYSICAL EXAM: VS:  BP 120/60   Pulse 78   Ht 5\' 1"  (1.549 m)   Wt 130 lb 3.2 oz (59.1 kg)   BMI 24.60 kg/m  , BMI Body mass index is 24.6 kg/m. GEN: Well nourished, well developed, in no acute distress  HEENT: normal  Neck: no JVD, carotid bruits, or masses Cardiac: RRR; 2/6 systolic murmur at the base, no rubs, or gallops,no edema  Respiratory:  clear to auscultation bilaterally, normal work of breathing GI: soft, nontender, nondistended, + BS MS: no deformity or atrophy  Skin: warm and dry, device site well healed Neuro:  Strength and sensation are intact Psych: euthymic mood, full affect  EKG:  EKG is not ordered today. Personal review of the ekg ordered 08/03/16 shows A paced, V sense  Personal review of  the device interrogation today. Results in [place]: 05/05/2016: ALT 8 07/30/2016: BUN 27; Creatinine, Ser 1.51; Hemoglobin 11.1; Magnesium 1.9; Platelets 168; Potassium 3.9; Sodium 134; TSH 3.643    Lipid Panel  No results found for: CHOL, TRIG, HDL, CHOLHDL, VLDL, LDLCALC, LDLDIRECT   Wt Readings from Last 3 Encounters:  11/03/16 130 lb 3.2 oz (59.1 kg)  10/26/16 129 lb (58.5 kg)  09/23/16 124 lb 6.4 oz (56.4 kg)      Other studies Reviewed: Additional studies/ records that were reviewed today include: PCP notes  TTE 01/28/16 - Outside records reviewed EF 65%, LA moderately enlarged  ASSESSMENT AND PLAN:  1.  Hypertension: Well-controlled today. Continue current management.  2. ICD in place: Device functioning appropriately.  Per records she has had inducible VT. No further ICD shocks or fast atrial fibrillation. No changes at this time.  3. Paroxysmal atrial fibrillation: Currently on Xarelto. Has not had further atrial fibrillation since her last device check. No changes at this time.  This patients CHA2DS2-VASc Score and unadjusted Ischemic Stroke Rate (% per year) is equal to 7.2 % stroke rate/year from a score of 5  Above score calculated as 1 point each if present [CHF, HTN, DM, Vascular=MI/PAD/Aortic Plaque, Age if 65-74, or Female] Above score calculated as 2 points each if present [Age > 75, or Stroke/TIA/TE]  4. Fatigue: At this point is unclear as to the cause for fatigue. She had an echocardiogram done in 2017 that showed no changes in her ejection fraction. It is likely that this is due to deconditioning. I have encouraged her to walk more as this may help with her symptoms.  Current medicines are reviewed at length with the patient today.   The patient does not have concerns regarding her medicines.  The following changes were made today:  none  Labs/ tests ordered today include:  No orders of the defined types were placed in this  encounter.    Disposition:   FU with Ota Ebersole 6 months  Signed, Nallely Yost Meredith Leeds, MD  11/03/2016 2:55 PM     [place] [address] [place] [address] [phone] (office) [phone] (fax)

## 2016-11-03 NOTE — Patient Instructions (Signed)
Your physician wants you to follow-up in: 6 MONTHS WITH DR CAMNITZ You will receive a reminder letter in the mail two months in advance. If you don't receive a letter, please call our office to schedule the follow-up appointment.   If you need a refill on your cardiac medications before your next appointment, please call your pharmacy.  

## 2016-11-23 ENCOUNTER — Ambulatory Visit (INDEPENDENT_AMBULATORY_CARE_PROVIDER_SITE_OTHER): Payer: Medicare Other | Admitting: Family Medicine

## 2016-11-23 ENCOUNTER — Encounter: Payer: Self-pay | Admitting: Family Medicine

## 2016-11-23 VITALS — BP 142/78 | HR 70 | Temp 97.7°F | Ht 61.0 in | Wt 129.2 lb

## 2016-11-23 DIAGNOSIS — M545 Other chronic pain: Secondary | ICD-10-CM

## 2016-11-23 DIAGNOSIS — N3281 Overactive bladder: Secondary | ICD-10-CM | POA: Diagnosis not present

## 2016-11-23 DIAGNOSIS — Z23 Encounter for immunization: Secondary | ICD-10-CM

## 2016-11-23 DIAGNOSIS — G8929 Other chronic pain: Secondary | ICD-10-CM

## 2016-11-23 DIAGNOSIS — R350 Frequency of micturition: Secondary | ICD-10-CM | POA: Diagnosis not present

## 2016-11-23 DIAGNOSIS — C44301 Unspecified malignant neoplasm of skin of nose: Secondary | ICD-10-CM | POA: Diagnosis not present

## 2016-11-23 DIAGNOSIS — M810 Age-related osteoporosis without current pathological fracture: Secondary | ICD-10-CM

## 2016-11-23 MED ORDER — OXYBUTYNIN CHLORIDE ER 5 MG PO TB24: 5.0000 mg | ORAL_TABLET | Freq: Every day | ORAL | 2 refills | Status: DC

## 2016-11-23 NOTE — Patient Instructions (Signed)
I will call with your urine results I have put in physical therapy and dermatology referrals.  If you don't hear something in one week call. I will call with the back x ray results I sent in a prescription for oxybutynin to try.  I hope it helps the nighttime frequency. I cleaned up your medicine list.

## 2016-11-23 NOTE — Progress Notes (Signed)
poct

## 2016-11-24 ENCOUNTER — Ambulatory Visit
Admission: RE | Admit: 2016-11-24 | Discharge: 2016-11-24 | Disposition: A | Discharge status: Home / Self Care | Payer: Medicare Other | Source: Ambulatory Visit | Attending: Family Medicine | Admitting: Family Medicine

## 2016-11-24 ENCOUNTER — Other Ambulatory Visit: Payer: Self-pay | Admitting: Family Medicine

## 2016-11-24 DIAGNOSIS — M545 Other chronic pain: Secondary | ICD-10-CM

## 2016-11-24 DIAGNOSIS — G8929 Other chronic pain: Secondary | ICD-10-CM

## 2016-11-24 DIAGNOSIS — M810 Age-related osteoporosis without current pathological fracture: Secondary | ICD-10-CM | POA: Insufficient documentation

## 2016-11-24 DIAGNOSIS — M5136 Other intervertebral disc degeneration, lumbar region: Secondary | ICD-10-CM | POA: Diagnosis not present

## 2016-11-24 NOTE — Assessment & Plan Note (Signed)
Reenter derm referral.

## 2016-11-24 NOTE — Assessment & Plan Note (Signed)
Previously on prolia and old compression fx on films.  Feel confident in dx despite no bone density.

## 2016-11-24 NOTE — Progress Notes (Signed)
   Subjective:    Patient ID: Brenda [name], female    DOB: [birth year], 83 y.o.   MRN: [id number]  HPI  Several issues: 1. Nightime frequency.  Longstanding problem.  No fever or dysuria.  Does have daytime urgency.  Treated as a UTI on 8/1 without sx improvement.  Culture from that visit was insignificant growth.  Unable to give urine today. 2. Skin problem on nose.  Previous skin surgery on nose for cancer.  Now easily bleeds and does not heal.  Was supposed to have a derm referral.  No one has called her. 3. Chronic back pain.  S/P surg x 5.  Was supposed to have a PT referral.  They never heard back about the referral.  Care previously in [place].  No films from here. 4. On prolia.  Again, all records in [place].  On for two years so likely would benefit from continued therapy.  She has lost 3 inches in height.    Review of Systems     Objective:   Physical Exam Abd benign.  Mild suprapubic tenderness.  No CVA tenderness. Entire lumbar and lower thoracic spine tender. Nose.  Previous scar and visible lesion.        Assessment & Plan:

## 2016-11-24 NOTE — Assessment & Plan Note (Signed)
Trial of low dose oxybutinin for nighttime frequency.

## 2016-11-24 NOTE — Assessment & Plan Note (Signed)
Get x rays and reenter PT referral.

## 2016-11-30 ENCOUNTER — Ambulatory Visit: Payer: Medicare Other | Attending: Family Medicine | Admitting: Physical Therapy

## 2016-11-30 DIAGNOSIS — G8929 Other chronic pain: Secondary | ICD-10-CM

## 2016-11-30 DIAGNOSIS — M6281 Muscle weakness (generalized): Secondary | ICD-10-CM | POA: Diagnosis not present

## 2016-11-30 DIAGNOSIS — R262 Difficulty in walking, not elsewhere classified: Secondary | ICD-10-CM | POA: Insufficient documentation

## 2016-11-30 DIAGNOSIS — R293 Abnormal posture: Secondary | ICD-10-CM | POA: Insufficient documentation

## 2016-11-30 DIAGNOSIS — M545 Other chronic pain: Secondary | ICD-10-CM

## 2016-11-30 NOTE — Patient Instructions (Signed)
  Blow up rubber balloons

## 2016-11-30 NOTE — Therapy (Signed)
[place], [place], [address] Phone: [phone]   Fax:  [phone]  Physical Therapy Evaluation  Patient Details  Name: Brenda [name] MRN: [id number] Date of Birth: 03-Jul-1931 Referring Provider: Zenia Resides, MD  Encounter Date: 11/30/2016      PT End of Session - 11/30/16 1542    Visit Number 1   Number of Visits 13   Date for PT Re-Evaluation 01/13/17   Authorization Type MCR- KX at visit 15   PT Start Time 1502   PT Stop Time 1542   PT Time Calculation (min) 40 min   Activity Tolerance Patient tolerated treatment well   Behavior During Therapy [facility] for tasks assessed/performed      Past Medical History:  Diagnosis Date  . Anemia   . Arthritis   . Basal cell carcinoma    nose  . Diabetes mellitus without complication ([name])    pt denies diabetes  . GERD (gastroesophageal reflux disease)   . Hiatal hernia   . HLD (hyperlipidemia)   . Hypertension   . Seizure ([place]) 2011    Past Surgical History:  Procedure Laterality Date  . CARDIAC DEFIBRILLATOR PLACEMENT    . CATARACT EXTRACTION Left 02/2001  . CHOLECYSTECTOMY    . LUMBAR LAMINECTOMY  06/11/2001  . LUMBAR LAMINECTOMY  02/09/2006  . LUMBAR LAMINECTOMY  07/07/2006   with spianal cord stimulator  . SPINAL CORD STIMULATOR REMOVAL  14/2009  . TUBAL LIGATION      There were no vitals filed for this visit.       Subjective Assessment - 11/30/16 1507    Subjective Lower back just hurts. Has had 4-5 back surgeries. Had a spinal stimulator but it was removed because it was not helping. Back hurts all the time, after sitting or laying she can hardly walk. full time with rolling walker, for about 7+ years. Lays on her side in fetal position, unable to lay flat. [place] about 5 months ago in her bedroom, does not know why.    How long can you sit comfortably? 30 min   Patient Stated Goals learn to walk again (wants to be off of  walker)   Currently in Pain? Yes   Pain Score 7    Pain Location Back   Pain Orientation Lower   Pain Descriptors / Indicators Aching   Aggravating Factors  sitting, walking   Pain Relieving Factors nothing            [facility] PT Assessment - 11/30/16 0001      Assessment   Medical Diagnosis LBP   Referring Provider Zenia Resides, MD   Onset Date/Surgical Date --  chronic   Hand Dominance Right   Next MD Visit PRN   Prior Therapy not this year     Precautions   Precautions Fall   Precaution Comments cardiac defibrillator     Restrictions   Weight Bearing Restrictions No     Balance Screen   Has the patient fallen in the past 6 months Yes   How many times? 1   Has the patient had a decrease in activity level because of a fear of falling?  Yes   Is the patient reluctant to leave their home because of a fear of falling?  Yes     [place] residence   Living Arrangements Spouse/significant other;Children   Additional Comments has stairs that she avoids     Prior  Function   Level of Independence Independent with basic ADLs     Cognition   Overall Cognitive Status Within Functional Limits for tasks assessed     Observation/Other Assessments   Focus on Therapeutic Outcomes (FOTO)  61% limited     Sensation   Additional Comments [facility]     Posture/Postural Control   Posture Comments thoracic kyphosis, flexed at hips and knees     ROM / Strength   AROM / PROM / Strength AROM;Strength     AROM   Overall AROM Comments severe limitation in lumbar ROM, GHJ flexion & abduction just past 90     Strength   Overall Strength Comments gross UE & LE strength 3+/5     Palpation   Palpation comment TTP in postural extensors, bilat QL            Objective measurements completed on examination: See above findings.          [place] Adult PT Treatment/Exercise - 11/30/16 0001      Exercises   Exercises Shoulder;Lumbar      Lumbar Exercises: Seated   Other Seated Lumbar Exercises pursed lip breathing for abdominal engagement     Shoulder Exercises: Seated   Retraction Limitations scapular retraction without back support   Theraband Level (Shoulder External Rotation) Level 2 (Red)                PT Education - 11/30/16 2038    Education provided Yes   Education Details anatomy of condition, POC, HEP, exercise form/rationale   Person(s) Educated Patient;Child(ren)   Methods Explanation;Demonstration;Tactile cues;Verbal cues;Handout   Comprehension Verbalized understanding;Returned demonstration;Verbal cues required;Tactile cues required;Need further instruction          PT Short Term Goals - 11/30/16 1732      PT SHORT TERM GOAL #1   Title pt will verbalize independence in HEP, moving around multiple times throughout the day   Baseline reports sitting around all day   Time 3   Period Weeks   Status New   Target Date 12/23/16           PT Long Term Goals - 11/30/16 1735      PT LONG TERM GOAL #1   Title FOTO to 52% limitation to indicate significant improvement in functional ability   Baseline 61% limitation at eval   Time 6   Period Weeks   Status New   Target Date 01/13/17     PT LONG TERM GOAL #2   Title Pt will verbalize averag pain <=4/10 with daily activities   Baseline 7/10 at eval   Time 6   Period Weeks   Status New   Target Date 01/13/17     PT LONG TERM GOAL #3   Title Pt will verbalize improvement in feeling of independence and confidence in ambulation   Baseline feels helpless at eval   Time 6   Period Weeks   Status New   Target Date 01/13/17     PT LONG TERM GOAL #4   Title Will be able to get up and walk after being seated/laying down with minimal-moderate discomfort   Baseline verbalizes severe discomfort and limitation at eval   Time 6   Period Weeks   Status New   Target Date 01/13/17                Plan - 11/30/16 1542    Clinical  Impression Statement Pt presents to PT with complaints of severe  LBP that is chronic. Severe kyphotic posture with forward head and cervical extension to compensate, flattened lumbar spine. Pt is not active at home and reports everything is just so different. She moved here from [place] in December. Gross ROM in GHJs is limited due to kyphosis. Gross LE & UE strength approx 3+/5. Pt is able to ambulate independently with RW and we discussed that she will most likely need to continue using it for safety. Also discussed how alignment of posture/spine will likely not change but we can strengthen to support her in her best posture. Pt with significant limitation in breathing ability, utilizing pursed lip breathing for abdominal engagement. Pt also Pt will benefit from skilled PT in order to improve gross strength, stability and independence to meet long term functional goals.    History and Personal Factors relevant to plan of care: anemia, HTN, h/o seizure, implanted cardiac defibrillator   Clinical Presentation Stable   Clinical Presentation due to: n/a   Clinical Decision Making Low   Rehab Potential Good   PT Frequency 2x / week   PT Duration 6 weeks   PT Treatment/Interventions ADLs/Self Care Home Management;Cryotherapy;Functional mobility training;Stair training;Gait training;Moist Heat;Therapeutic activities;Therapeutic exercise;Balance training;Neuromuscular re-education;Patient/family education;Passive range of motion;Manual techniques;Dry needling;Taping   PT Next Visit Plan nu step UE&LE, postural training, balance, glut activation   Consulted and Agree with Plan of Care Patient;Family member/caregiver   Family Member Consulted daughter      Patient will benefit from skilled therapeutic intervention in order to improve the following deficits and impairments:  Abnormal gait, Decreased range of motion, Difficulty walking, Impaired UE functional use, Increased muscle spasms, Decreased activity  tolerance, Pain, Improper body mechanics, Impaired flexibility, Hypomobility, Decreased mobility, Decreased strength, Postural dysfunction  Visit Diagnosis: Chronic bilateral low back pain without sciatica - Plan: PT plan of care cert/re-cert  Muscle weakness (generalized) - Plan: PT plan of care cert/re-cert  Abnormal posture - Plan: PT plan of care cert/re-cert  Difficulty in walking, not elsewhere classified - Plan: PT plan of care cert/re-cert      G-Codes - 73/22/02 06/28/42    Functional Assessment Tool Used (Outpatient Only) FOTO 61% limited,  clinical judgement   Functional Limitation Mobility: Walking and moving around   Mobility: Walking and Moving Around Current Status [phone]) At least 60 percent but less than 80 percent impaired, limited or restricted   Mobility: Walking and Moving Around Goal Status [phone]) At least 40 percent but less than 60 percent impaired, limited or restricted       Problem List Patient Active Problem List   Diagnosis Date Noted  . Osteoporosis 11/24/2016  . Skin cancer of nose 11/23/2016  . 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 UTI (urinary tract infection) 10/26/2016  . Paroxysmal A-fib ([place]) 08/05/2016  . AKI (acute kidney injury) ([place]) 05/25/2016  . Essential hypertension 05/05/2016  . Type 2 diabetes mellitus without complication, without long-term current use of insulin ([place]) 05/05/2016  . Hiatal hernia 05/05/2016  . History of CVA (cerebrovascular accident) without residual deficits 05/05/2016  . Memory change 05/05/2016  . AICD (automatic cardioverter/defibrillator) present 05/05/2016  . Back pain 05/05/2016    Ashten Sarnowski C. Kyrstan Gotwalt PT, DPT 11/30/16 9:08 PM   [place] [facility] [address] [place], [place], [address] Phone: [phone]   Fax:  [phone]  Name: Brenda [name] MRN: [id number] Date of Birth: Jan 05, 1932

## 2016-12-07 ENCOUNTER — Ambulatory Visit: Payer: Medicare Other | Admitting: Physical Therapy

## 2016-12-07 DIAGNOSIS — G8929 Other chronic pain: Secondary | ICD-10-CM | POA: Diagnosis not present

## 2016-12-07 DIAGNOSIS — M545 Other chronic pain: Secondary | ICD-10-CM

## 2016-12-07 DIAGNOSIS — R262 Difficulty in walking, not elsewhere classified: Secondary | ICD-10-CM

## 2016-12-07 DIAGNOSIS — M6281 Muscle weakness (generalized): Secondary | ICD-10-CM | POA: Diagnosis not present

## 2016-12-07 DIAGNOSIS — R293 Abnormal posture: Secondary | ICD-10-CM

## 2016-12-07 NOTE — Patient Instructions (Signed)
Scapula Adduction With Pectoralis Stretch: Low - Standing   SIT IN A CHAIR WITH WHEELS AND BACK SUPPORT.  HAVE HUSBAND PUSH CHAIR SLIGHTLY FORWARD FOR STRETCH.  Shoulders at 45 hands even with shoulders, keeping weight through legs, shift weight forward until you feel pull or stretch through the front of your chest. Hold _30__ seconds. Do _3__ times, _2-4__ times per day.  PELVIC TILT: Anterior    Start in slumped position. Roll pelvis forward to arch back. __15_ reps per set, __several_ sets per day, _7__ days per week   Extensors / Rotators, Sitting Knee to Chest    Sit, both hands cupped under one knee. Gently pull toward chest. Exhale as knee comes up. Hold _15__ seconds.  Repeat with other leg Repeat _2__ times per session. Do __2-3_ sessions per day.  Isometric Hip Adduction    With towel rolled between knees, press thighs together. Hold __5__ seconds while counting out loud. Repeat __15__ times. Do __2-3__ sessions per day.  [email or web address].exer.us/630   Copyright  VHI. All rights reserved.

## 2016-12-07 NOTE — Therapy (Signed)
[place], [place], [address] Phone: [phone]   Fax:  [phone]  Physical Therapy Treatment  Patient Details  Name: Brenda [name] MRN: [id number] Date of Birth: 1931/10/15 Referring Provider: Zenia Resides, MD  Encounter Date: 12/07/2016      PT End of Session - 12/07/16 1459    Visit Number 2   Number of Visits 13   Date for PT Re-Evaluation 01/13/17   Authorization Type MCR- KX at visit 15   PT Start Time 1761   PT Stop Time 1457   PT Time Calculation (min) 42 min   Activity Tolerance Patient tolerated treatment well;Treatment limited secondary to medical complications (Comment)  poor tolerance to certain positions   Behavior During Therapy [facility] for tasks assessed/performed      Past Medical History:  Diagnosis Date  . Anemia   . Arthritis   . Basal cell carcinoma    nose  . Diabetes mellitus without complication ([place])    pt denies diabetes  . GERD (gastroesophageal reflux disease)   . Hiatal hernia   . HLD (hyperlipidemia)   . Hypertension   . Seizure ([place]) 2011    Past Surgical History:  Procedure Laterality Date  . CARDIAC DEFIBRILLATOR PLACEMENT    . CATARACT EXTRACTION Left 02/2001  . CHOLECYSTECTOMY    . LUMBAR LAMINECTOMY  06/11/2001  . LUMBAR LAMINECTOMY  02/09/2006  . LUMBAR LAMINECTOMY  07/07/2006   with spianal cord stimulator  . SPINAL CORD STIMULATOR REMOVAL  14/2009  . TUBAL LIGATION      There were no vitals filed for this visit.      Subjective Assessment - 12/07/16 1419    Subjective exercises are going "okay"   Patient Stated Goals learn to walk again (wants to be off of walker)   Currently in Pain? Yes   Pain Score 0-No pain   Pain Location Back   Pain Orientation Lower   Pain Descriptors / Indicators Aching   Aggravating Factors  sitting, walking   Pain Relieving Factors nothing                         [facility] Adult PT  Treatment/Exercise - 12/07/16 1422      Exercises   Exercises Knee/Hip     Lumbar Exercises: Stretches   Single Knee to Chest Stretch 3 reps;30 seconds   Single Knee to Chest Stretch Limitations bil; seated with back support     Lumbar Exercises: Aerobic   Stationary Bike NuStep L3 x 6 min - increased time due to frequent stops and needing to add pillow as seat back was too painful at thoracic spine     Lumbar Exercises: Seated   Other Seated Lumbar Exercises ant pelvic tilt x 15 reps - manual cues for facilitation in w/c for back support; glut set 5 sec x 15 reps with back support     Knee/Hip Exercises: Seated   Ball Squeeze 15 x 5 reps     Shoulder Exercises: Seated   Retraction 15 reps   Retraction Limitations scapular retraction without back support; 5 sec hold   External Rotation Both;15 reps   Theraband Level (Shoulder External Rotation) Level 2 (Red)   External Rotation Limitations cues for technique     Shoulder Exercises: Stretch   Other Shoulder Stretches low doorway stretch 3x30 sec in w/c  PT Education - 12/07/16 1459    Education provided Yes   Education Details HEP   Person(s) Educated Patient   Methods Explanation;Demonstration;Handout   Comprehension Verbalized understanding;Returned demonstration          PT Short Term Goals - 11/30/16 1732      PT SHORT TERM GOAL #1   Title pt will verbalize independence in HEP, moving around multiple times throughout the day   Baseline reports sitting around all day   Time 3   Period Weeks   Status New   Target Date 12/23/16           PT Long Term Goals - 11/30/16 1735      PT LONG TERM GOAL #1   Title FOTO to 52% limitation to indicate significant improvement in functional ability   Baseline 61% limitation at eval   Time 6   Period Weeks   Status New   Target Date 01/13/17     PT LONG TERM GOAL #2   Title Pt will verbalize averag pain <=4/10 with daily activities   Baseline  7/10 at eval   Time 6   Period Weeks   Status New   Target Date 01/13/17     PT LONG TERM GOAL #3   Title Pt will verbalize improvement in feeling of independence and confidence in ambulation   Baseline feels helpless at eval   Time 6   Period Weeks   Status New   Target Date 01/13/17     PT LONG TERM GOAL #4   Title Will be able to get up and walk after being seated/laying down with minimal-moderate discomfort   Baseline verbalizes severe discomfort and limitation at eval   Time 6   Period Weeks   Status New   Target Date 01/13/17               Plan - 12/07/16 1537    Clinical Impression Statement Pt presents today reporting she's been doing HEP but needed mod cues for technique.  Added chest stretch and additional seated core/hip exercises today to HEP.  Pt only able to tolerate sitting unsupported for short periods so needed to switch to w/c for supported sitting.  Will continue to benefit from PT to address pain and function.   PT Treatment/Interventions ADLs/Self Care Home Management;Cryotherapy;Functional mobility training;Stair training;Gait training;Moist Heat;Therapeutic activities;Therapeutic exercise;Balance training;Neuromuscular re-education;Patient/family education;Passive range of motion;Manual techniques;Dry needling;Taping   PT Next Visit Plan nu step UE&LE, postural training, balance, glut activation; review new HEP   Consulted and Agree with Plan of Care Patient      Patient will benefit from skilled therapeutic intervention in order to improve the following deficits and impairments:  Abnormal gait, Decreased range of motion, Difficulty walking, Impaired UE functional use, Increased muscle spasms, Decreased activity tolerance, Pain, Improper body mechanics, Impaired flexibility, Hypomobility, Decreased mobility, Decreased strength, Postural dysfunction  Visit Diagnosis: Chronic bilateral low back pain without sciatica  Muscle weakness  (generalized)  Abnormal posture  Difficulty in walking, not elsewhere classified     Problem List Patient Active Problem List   Diagnosis Date Noted  . Osteoporosis 11/24/2016  . Skin cancer of nose 11/23/2016  . 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 UTI (urinary tract infection) 10/26/2016  . Paroxysmal A-fib ([place] 08/05/2016  . AKI (acute kidney injury) ([place]) 05/25/2016  . Essential hypertension 05/05/2016  . Type 2 diabetes mellitus without complication, without long-term current use of insulin ([place]) 05/05/2016  . Hiatal hernia 05/05/2016  .  History of CVA (cerebrovascular accident) without residual deficits 05/05/2016  . Memory change 05/05/2016  . AICD (automatic cardioverter/defibrillator) present 05/05/2016  . Back pain 05/05/2016      Laureen Abrahams, PT, DPT 12/07/16 3:40 PM     [facility] [address] [place], [place], [address] Phone: [phone]   Fax:  [phone]  Name: Brenda [name] MRN: [id number] Date of Birth: 1932/01/23

## 2016-12-12 ENCOUNTER — Ambulatory Visit: Payer: Medicare Other | Admitting: Physical Therapy

## 2016-12-12 ENCOUNTER — Encounter: Payer: Self-pay | Admitting: Physical Therapy

## 2016-12-12 DIAGNOSIS — R262 Difficulty in walking, not elsewhere classified: Secondary | ICD-10-CM

## 2016-12-12 DIAGNOSIS — M545 Other chronic pain: Secondary | ICD-10-CM

## 2016-12-12 DIAGNOSIS — G8929 Other chronic pain: Secondary | ICD-10-CM

## 2016-12-12 DIAGNOSIS — R293 Abnormal posture: Secondary | ICD-10-CM

## 2016-12-12 DIAGNOSIS — M6281 Muscle weakness (generalized): Secondary | ICD-10-CM | POA: Diagnosis not present

## 2016-12-12 NOTE — Therapy (Signed)
[place], [place], [address] Phone: [phone]   Fax:  [phone]  Physical Therapy Treatment  Patient Details  Name: Brenda [name] MRN: [id number] Date of Birth: 13-Aug-1931 Referring Provider: Zenia Resides, MD  Encounter Date: 12/12/2016      PT End of Session - 12/12/16 1148    Visit Number 3   Number of Visits 13   Date for PT Re-Evaluation 01/13/17   Authorization Type MCR- KX at visit 15   PT Start Time 9211   PT Stop Time 1236   PT Time Calculation (min) 48 min   Activity Tolerance Patient tolerated treatment well   Behavior During Therapy [facility] for tasks assessed/performed      Past Medical History:  Diagnosis Date  . Anemia   . Arthritis   . Basal cell carcinoma    nose  . Diabetes mellitus without complication ([place])    pt denies diabetes  . GERD (gastroesophageal reflux disease)   . Hiatal hernia   . HLD (hyperlipidemia)   . Hypertension   . Seizure ([place]) 2011    Past Surgical History:  Procedure Laterality Date  . CARDIAC DEFIBRILLATOR PLACEMENT    . CATARACT EXTRACTION Left 02/2001  . CHOLECYSTECTOMY    . LUMBAR LAMINECTOMY  06/11/2001  . LUMBAR LAMINECTOMY  02/09/2006  . LUMBAR LAMINECTOMY  07/07/2006   with spianal cord stimulator  . SPINAL CORD STIMULATOR REMOVAL  14/2009  . TUBAL LIGATION      There were no vitals filed for this visit.      Subjective Assessment - 12/12/16 1148    Subjective my spine bone that sticks out hurts. when asked about exercises "I wasn't too good on them this weekend, I was more worried about the rain"   I could hardly move after my last visit.    Patient Stated Goals learn to walk again (wants to be off of walker)   Currently in Pain? Yes   Pain Score 7    Pain Location Back   Pain Orientation Lower   Aggravating Factors  always hurts   Pain Relieving Factors exercises are helpful once she gets started                          [facility] Adult PT Treatment/Exercise - 12/12/16 0001      Exercises   Exercises Other Exercises   Other Exercises  pulleys 2 min flx     Lumbar Exercises: Aerobic   Stationary Bike L3 10 min UE & LE  pillow at lumbar spine below kyphosis     Lumbar Exercises: Seated   Hip Flexion on Ball Limitations seated marching in chair   Other Seated Lumbar Exercises seated iso press into ball; cues to incoorporate breathing     Knee/Hip Exercises: Seated   Ball Squeeze 3s holds with abdominal engagement     Shoulder Exercises: Seated   Extension Limitations resisted triceps extension yellow tband   Row Theraband;20 reps   Theraband Level (Shoulder Row) Level 1 (Yellow)   Flexion 10 reps   Flexion Weight (lbs) 1   Other Seated Exercises "W" arms 1lb weights, ball bw knees     Modalities   Modalities Moist Heat     Moist Heat Therapy   Number Minutes Moist Heat 10 Minutes   Moist Heat Location Lumbar Spine  PT Education - 12/12/16 1200    Education provided Yes   Education Details soreness after exercise and importance of mobility to reduce stiffness, exercise form/rationale   Person(s) Educated Patient   Methods Explanation;Demonstration;Tactile cues;Verbal cues   Comprehension Verbalized understanding;Returned demonstration;Verbal cues required;Tactile cues required;Need further instruction          PT Short Term Goals - 11/30/16 1732      PT SHORT TERM GOAL #1   Title pt will verbalize independence in HEP, moving around multiple times throughout the day   Baseline reports sitting around all day   Time 3   Period Weeks   Status New   Target Date 12/23/16           PT Long Term Goals - 11/30/16 1735      PT LONG TERM GOAL #1   Title FOTO to 52% limitation to indicate significant improvement in functional ability   Baseline 61% limitation at eval   Time 6   Period Weeks   Status New   Target Date  01/13/17     PT LONG TERM GOAL #2   Title Pt will verbalize averag pain <=4/10 with daily activities   Baseline 7/10 at eval   Time 6   Period Weeks   Status New   Target Date 01/13/17     PT LONG TERM GOAL #3   Title Pt will verbalize improvement in feeling of independence and confidence in ambulation   Baseline feels helpless at eval   Time 6   Period Weeks   Status New   Target Date 01/13/17     PT LONG TERM GOAL #4   Title Will be able to get up and walk after being seated/laying down with minimal-moderate discomfort   Baseline verbalizes severe discomfort and limitation at eval   Time 6   Period Weeks   Status New   Target Date 01/13/17               Plan - 12/12/16 1159    Clinical Impression Statement Increased Nu step time today due to pt finding comfortable position with pillow to exercise. Was able to complete all exercises today without c/o back pain with pillow. Rest breaks required due to fatigue. reported arms hurting after exercise and educated on importance of mobility because exercises will make her sore.    PT Treatment/Interventions ADLs/Self Care Home Management;Cryotherapy;Functional mobility training;Stair training;Gait training;Moist Heat;Therapeutic activities;Therapeutic exercise;Balance training;Neuromuscular re-education;Patient/family education;Passive range of motion;Manual techniques;Dry needling;Taping   PT Next Visit Plan nu step UE&LE, postural training, balance, glut activation   Consulted and Agree with Plan of Care Patient      Patient will benefit from skilled therapeutic intervention in order to improve the following deficits and impairments:  Abnormal gait, Decreased range of motion, Difficulty walking, Impaired UE functional use, Increased muscle spasms, Decreased activity tolerance, Pain, Improper body mechanics, Impaired flexibility, Hypomobility, Decreased mobility, Decreased strength, Postural dysfunction  Visit  Diagnosis: Chronic bilateral low back pain without sciatica  Muscle weakness (generalized)  Abnormal posture  Difficulty in walking, not elsewhere classified     Problem List Patient Active Problem List   Diagnosis Date Noted  . Osteoporosis 11/24/2016  . Skin cancer of nose 11/23/2016  . 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 UTI (urinary tract infection) 10/26/2016  . Paroxysmal A-fib ([place]) 08/05/2016  . AKI (acute kidney injury) ([place]) 05/25/2016  . Essential hypertension 05/05/2016  . Type 2 diabetes mellitus without complication, without long-term current use of  insulin ([place]) 05/05/2016  . Hiatal hernia 05/05/2016  . History of CVA (cerebrovascular accident) without residual deficits 05/05/2016  . Memory change 05/05/2016  . AICD (automatic cardioverter/defibrillator) present 05/05/2016  . Back pain 05/05/2016    Lamont Glasscock C. Jasman Pfeifle PT, DPT 12/12/16 12:31 PM   [place] [facility] [address] [place], [place], [address] Phone: [phone]   Fax:  [phone]  Name: Yashira Offenberger MRN: [id number] Date of Birth: [birth year]

## 2016-12-14 ENCOUNTER — Ambulatory Visit: Payer: Medicare Other | Admitting: Physical Therapy

## 2016-12-14 DIAGNOSIS — R293 Abnormal posture: Secondary | ICD-10-CM

## 2016-12-14 DIAGNOSIS — M6281 Muscle weakness (generalized): Secondary | ICD-10-CM

## 2016-12-14 DIAGNOSIS — R262 Difficulty in walking, not elsewhere classified: Secondary | ICD-10-CM | POA: Diagnosis not present

## 2016-12-14 DIAGNOSIS — G8929 Other chronic pain: Secondary | ICD-10-CM

## 2016-12-14 DIAGNOSIS — M545 Other chronic pain: Secondary | ICD-10-CM

## 2016-12-14 NOTE — Therapy (Signed)
[place], [place], [address] Phone: [phone]   Fax:  [phone]  Physical Therapy Treatment  Patient Details  Name: Brenda [name] MRN: [id number] Date of Birth: [birth year] Referring Provider: Zenia Resides, MD  Encounter Date: 12/14/2016      PT End of Session - 12/14/16 1354    Visit Number 4   Number of Visits 13   Date for PT Re-Evaluation 01/13/17   Authorization Type MCR- KX at visit 15   PT Start Time 0100   PT Stop Time 0155   PT Time Calculation (min) 55 min      Past Medical History:  Diagnosis Date  . Anemia   . Arthritis   . Basal cell carcinoma    nose  . Diabetes mellitus without complication ([place])    pt denies diabetes  . GERD (gastroesophageal reflux disease)   . Hiatal hernia   . HLD (hyperlipidemia)   . Hypertension   . Seizure ([place]) 2011    Past Surgical History:  Procedure Laterality Date  . CARDIAC DEFIBRILLATOR PLACEMENT    . CATARACT EXTRACTION Left 02/2001  . CHOLECYSTECTOMY    . LUMBAR LAMINECTOMY  06/11/2001  . LUMBAR LAMINECTOMY  02/09/2006  . LUMBAR LAMINECTOMY  07/07/2006   with spianal cord stimulator  . SPINAL CORD STIMULATOR REMOVAL  14/2009  . TUBAL LIGATION      There were no vitals filed for this visit.      Subjective Assessment - 12/14/16 1309    Subjective I am  better today.    Currently in Pain? Yes   Pain Score 6    Pain Location Back   Pain Orientation Lower   Pain Descriptors / Indicators Aching   Pain Type Chronic pain   Aggravating Factors  prolonged standing    Pain Relieving Factors change positions                          [facility] Adult PT Treatment/Exercise - 12/14/16 0001      Exercises   Other Exercises  pulleys 2 min flx     Lumbar Exercises: Aerobic   Stationary Bike L3 11 min UE & LE  pillow at lumbar spine below kyphosis     Lumbar Exercises: Seated   Hip Flexion on Ball Limitations  seated marching in chair, seated ball squeeze x 10   Other Seated Lumbar Exercises seated iso press into ball; cues to incoorporate breathing     Knee/Hip Exercises: Seated   Ball Squeeze 3s holds with abdominal engagement     Shoulder Exercises: Seated   Extension Limitations resisted triceps extension yellow tband   Retraction 15 reps   Row Theraband;20 reps   Theraband Level (Shoulder Row) Level 1 (Yellow)   External Rotation Both;15 reps   Theraband Level (Shoulder External Rotation) Level 1 (Yellow)   External Rotation Weight (lbs) with chest lift     Flexion 10 reps   Flexion Weight (lbs) 1  disc. weight due to LEft shoulder  pain   Other Seated Exercises W arms no wt x 10      Moist Heat Therapy   Number Minutes Moist Heat 10 Minutes   Moist Heat Location Lumbar Spine                  PT Short Term Goals - 11/30/16 1732      PT SHORT TERM GOAL #1   Title  pt will verbalize independence in HEP, moving around multiple times throughout the day   Baseline reports sitting around all day   Time 3   Period Weeks   Status New   Target Date 12/23/16           PT Long Term Goals - 11/30/16 1735      PT LONG TERM GOAL #1   Title FOTO to 52% limitation to indicate significant improvement in functional ability   Baseline 61% limitation at eval   Time 6   Period Weeks   Status New   Target Date 01/13/17     PT LONG TERM GOAL #2   Title Pt will verbalize averag pain <=4/10 with daily activities   Baseline 7/10 at eval   Time 6   Period Weeks   Status New   Target Date 01/13/17     PT LONG TERM GOAL #3   Title Pt will verbalize improvement in feeling of independence and confidence in ambulation   Baseline feels helpless at eval   Time 6   Period Weeks   Status New   Target Date 01/13/17     PT LONG TERM GOAL #4   Title Will be able to get up and walk after being seated/laying down with minimal-moderate discomfort   Baseline verbalizes severe  discomfort and limitation at eval   Time 6   Period Weeks   Status New   Target Date 01/13/17               Plan - 12/14/16 1352    Clinical Impression Statement Pt is feeling better today. She was sore in shoulders after last visit. She had some left shouler pain with the 1# weights so we did not use them today. Continued seated core and UE/scapular exercises with tood tolerance. HMP to low back at end of session. Pt must reposition various times during treatment due to painful protruding bone in spine.    PT Next Visit Plan nu step UE&LE, postural training, balance, glut activation   Consulted and Agree with Plan of Care Patient      Patient will benefit from skilled therapeutic intervention in order to improve the following deficits and impairments:  Abnormal gait, Decreased range of motion, Difficulty walking, Impaired UE functional use, Increased muscle spasms, Decreased activity tolerance, Pain, Improper body mechanics, Impaired flexibility, Hypomobility, Decreased mobility, Decreased strength, Postural dysfunction  Visit Diagnosis: Chronic bilateral low back pain without sciatica  Muscle weakness (generalized)  Abnormal posture  Difficulty in walking, not elsewhere classified     Problem List Patient Active Problem List   Diagnosis Date Noted  . Osteoporosis 11/24/2016  . Skin cancer of nose 11/23/2016  . 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 UTI (urinary tract infection) 10/26/2016  . Paroxysmal A-fib ([place]) 08/05/2016  . AKI (acute kidney injury) ([place]) 05/25/2016  . Essential hypertension 05/05/2016  . Type 2 diabetes mellitus without complication, without long-term current use of insulin ([place]) 05/05/2016  . Hiatal hernia 05/05/2016  . History of CVA (cerebrovascular accident) without residual deficits 05/05/2016  . Memory change 05/05/2016  . AICD (automatic cardioverter/defibrillator) present 05/05/2016  . Back pain 05/05/2016    Dorene Ar,  PTA 12/14/2016, 1:55 PM  [place], [place], [address] Phone: [phone]   Fax:  [phone]  Name: Brenda [name] MRN: [id number] Date of Birth: [birth year]

## 2016-12-19 ENCOUNTER — Ambulatory Visit: Payer: Medicare Other | Admitting: Physical Therapy

## 2016-12-19 ENCOUNTER — Encounter: Payer: Self-pay | Admitting: Physical Therapy

## 2016-12-19 ENCOUNTER — Telehealth: Payer: Self-pay | Admitting: Family Medicine

## 2016-12-19 DIAGNOSIS — M6281 Muscle weakness (generalized): Secondary | ICD-10-CM

## 2016-12-19 DIAGNOSIS — R262 Difficulty in walking, not elsewhere classified: Secondary | ICD-10-CM

## 2016-12-19 DIAGNOSIS — G8929 Other chronic pain: Secondary | ICD-10-CM | POA: Diagnosis not present

## 2016-12-19 DIAGNOSIS — M545 Other chronic pain: Secondary | ICD-10-CM

## 2016-12-19 DIAGNOSIS — R293 Abnormal posture: Secondary | ICD-10-CM | POA: Diagnosis not present

## 2016-12-19 NOTE — Therapy (Signed)
[place], [place], [address] Phone: [phone]   Fax:  [phone]  Physical Therapy Treatment  Patient Details  Name: Brenda [name] MRN: [id number] Date of Birth: 12-May-1931 Referring Provider: Zenia Resides, MD  Encounter Date: 12/19/2016      PT End of Session - 12/19/16 1155    Visit Number 5   Number of Visits 13   Date for PT Re-Evaluation 01/13/17   Authorization Type MCR- KX at visit 15   PT Start Time 1150   PT Stop Time 1233   PT Time Calculation (min) 43 min   Activity Tolerance Patient tolerated treatment well   Behavior During Therapy [facility] [place] for tasks assessed/performed      Past Medical History:  Diagnosis Date  . Anemia   . Arthritis   . Basal cell carcinoma    nose  . Diabetes mellitus without complication ([place])    pt denies diabetes  . GERD (gastroesophageal reflux disease)   . Hiatal hernia   . HLD (hyperlipidemia)   . Hypertension   . Seizure ([place]) 2011    Past Surgical History:  Procedure Laterality Date  . CARDIAC DEFIBRILLATOR PLACEMENT    . CATARACT EXTRACTION Left 02/2001  . CHOLECYSTECTOMY    . LUMBAR LAMINECTOMY  06/11/2001  . LUMBAR LAMINECTOMY  02/09/2006  . LUMBAR LAMINECTOMY  07/07/2006   with spianal cord stimulator  . SPINAL CORD STIMULATOR REMOVAL  14/2009  . TUBAL LIGATION      There were no vitals filed for this visit.      Subjective Assessment - 12/19/16 1155    Subjective reports feeling pretty good. lower back is still what hurts, I have to be so careful when I get up and if not I can hardly move.    Patient Stated Goals learn to walk again (wants to be off of walker)   Currently in Pain? Yes   Pain Score 7    Pain Location Back   Pain Orientation Lower   Aggravating Factors  getting up from a chair                         [facility] Adult PT Treatment/Exercise - 12/19/16 0001      Lumbar Exercises: Aerobic   Stationary Bike L4 8 min UE & LE     Knee/Hip Exercises: Stretches   Passive Hamstring Stretch Limitations seated edge of chair     Knee/Hip Exercises: Seated   Long Arc Quad 10 reps;Both   Clamshell with TheraBand Red  x20   Other Seated Knee/Hip Exercises resisted PF, green tband   Hamstring Curl 10 reps   Hamstring Limitations yellow tband     Shoulder Exercises: Seated   Horizontal ABduction 10 reps   Theraband Level (Shoulder Horizontal ABduction) Level 1 (Yellow)                PT Education - 12/19/16 1304    Education provided Yes   Education Details exercise form/rationale, HEP, importance of regular mobility   Person(s) Educated Patient   Methods Explanation;Demonstration;Tactile cues;Verbal cues;Handout   Comprehension Verbalized understanding;Returned demonstration;Verbal cues required;Tactile cues required;Need further instruction          PT Short Term Goals - 12/19/16 1158      PT SHORT TERM GOAL #1   Title pt will verbalize independence in HEP, moving around multiple times throughout the day   Baseline I have been trying to  get up and move more often, not sure if it is once/hour   Status Achieved           PT Long Term Goals - 11/30/16 1735      PT LONG TERM GOAL #1   Title FOTO to 52% limitation to indicate significant improvement in functional ability   Baseline 61% limitation at eval   Time 6   Period Weeks   Status New   Target Date 01/13/17     PT LONG TERM GOAL #2   Title Pt will verbalize averag pain <=4/10 with daily activities   Baseline 7/10 at eval   Time 6   Period Weeks   Status New   Target Date 01/13/17     PT LONG TERM GOAL #3   Title Pt will verbalize improvement in feeling of independence and confidence in ambulation   Baseline feels helpless at eval   Time 6   Period Weeks   Status New   Target Date 01/13/17     PT LONG TERM GOAL #4   Title Will be able to get up and walk after being seated/laying down with  minimal-moderate discomfort   Baseline verbalizes severe discomfort and limitation at eval   Time 6   Period Weeks   Status New   Target Date 01/13/17               Plan - 12/19/16 1301    Clinical Impression Statement Pt did not complain of back pain with exercises today but did report fatigue in lower extremities. Added some exercises to HEP and encouraged her to walk frequently throughout the day.    PT Treatment/Interventions ADLs/Self Care Home Management;Cryotherapy;Functional mobility training;Stair training;Gait training;Moist Heat;Therapeutic activities;Therapeutic exercise;Balance training;Neuromuscular re-education;Patient/family education;Passive range of motion;Manual techniques;Dry needling;Taping   PT Next Visit Plan nu step UE&LE, glut activation   PT Home Exercise Plan LAQ, seated clam, UE horiz abd, resisted PF.    Consulted and Agree with Plan of Care Patient      Patient will benefit from skilled therapeutic intervention in order to improve the following deficits and impairments:  Abnormal gait, Decreased range of motion, Difficulty walking, Impaired UE functional use, Increased muscle spasms, Decreased activity tolerance, Pain, Improper body mechanics, Impaired flexibility, Hypomobility, Decreased mobility, Decreased strength, Postural dysfunction  Visit Diagnosis: Chronic bilateral low back pain without sciatica  Muscle weakness (generalized)  Difficulty in walking, not elsewhere classified  Abnormal posture     Problem List Patient Active Problem List   Diagnosis Date Noted  . Osteoporosis 11/24/2016  . Skin cancer of nose 11/23/2016  . 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 UTI (urinary tract infection) 10/26/2016  . Paroxysmal A-fib ([place]) 08/05/2016  . AKI (acute kidney injury) ([place]) 05/25/2016  . Essential hypertension 05/05/2016  . Type 2 diabetes mellitus without complication, without long-term current use of insulin ([place]) 05/05/2016  . Hiatal  hernia 05/05/2016  . History of CVA (cerebrovascular accident) without residual deficits 05/05/2016  . Memory change 05/05/2016  . AICD (automatic cardioverter/defibrillator) present 05/05/2016  . Back pain 05/05/2016    Jansen Sciuto C. Terrion Poblano PT, DPT 12/19/16 1:05 PM   [facility] [address] [place], [place], [address] Phone: [phone]   Fax:  [phone]  Name: Basya Casavant MRN: [id number] Date of Birth: 1931-08-08

## 2016-12-19 NOTE — Telephone Encounter (Signed)
Handicap form dropped off for at front desk for completion.  Verified that patient section of form has been completed.  Last DOS/WCC with PCP was 10/26/16  Placed form in team folder to be completed by clinical staff.  Crista Luria

## 2016-12-20 NOTE — Telephone Encounter (Signed)
Clinical info completed on Handicap Placard form.  Place form in Dr. Rosario Jacks box for completion.  Brenda [name], Brenda [name] D, [place]

## 2016-12-20 NOTE — Telephone Encounter (Signed)
Form completed. Given to RN.

## 2016-12-21 ENCOUNTER — Ambulatory Visit: Payer: Medicare Other | Admitting: Physical Therapy

## 2016-12-21 ENCOUNTER — Encounter: Payer: Self-pay | Admitting: Physical Therapy

## 2016-12-21 DIAGNOSIS — G8929 Other chronic pain: Secondary | ICD-10-CM

## 2016-12-21 DIAGNOSIS — M545 Other chronic pain: Secondary | ICD-10-CM

## 2016-12-21 DIAGNOSIS — M6281 Muscle weakness (generalized): Secondary | ICD-10-CM | POA: Diagnosis not present

## 2016-12-21 DIAGNOSIS — R262 Difficulty in walking, not elsewhere classified: Secondary | ICD-10-CM | POA: Diagnosis not present

## 2016-12-21 DIAGNOSIS — R293 Abnormal posture: Secondary | ICD-10-CM

## 2016-12-21 NOTE — Therapy (Signed)
[place], [place], [address] Phone: [phone]   Fax:  [phone]  Physical Therapy Treatment  Patient Details  Name: Brenda [name] MRN: [id number] Date of Birth: Feb 20, 1932 Referring Provider: Zenia Resides, MD  Encounter Date: 12/21/2016      PT End of Session - 12/21/16 1326    Visit Number 6   Number of Visits 13   Date for PT Re-Evaluation 01/13/17   Authorization Type MCR- KX at visit 15   PT Start Time 1330   PT Stop Time 1413   PT Time Calculation (min) 43 min   Activity Tolerance Patient limited by pain   Behavior During Therapy [facility] for tasks assessed/performed      Past Medical History:  Diagnosis Date  . Anemia   . Arthritis   . Basal cell carcinoma    nose  . Diabetes mellitus without complication ([place])    pt denies diabetes  . GERD (gastroesophageal reflux disease)   . Hiatal hernia   . HLD (hyperlipidemia)   . Hypertension   . Seizure ([place]) 2011    Past Surgical History:  Procedure Laterality Date  . CARDIAC DEFIBRILLATOR PLACEMENT    . CATARACT EXTRACTION Left 02/2001  . CHOLECYSTECTOMY    . LUMBAR LAMINECTOMY  06/11/2001  . LUMBAR LAMINECTOMY  02/09/2006  . LUMBAR LAMINECTOMY  07/07/2006   with spianal cord stimulator  . SPINAL CORD STIMULATOR REMOVAL  14/2009  . TUBAL LIGATION      There were no vitals filed for this visit.      Subjective Assessment - 12/21/16 1337    Subjective I feel like I have been run over by a truck today.    Patient Stated Goals learn to walk again (wants to be off of walker)   Currently in Pain? Yes                         [facility] Adult PT Treatment/Exercise - 12/21/16 0001      Lumbar Exercises: Aerobic   Stationary Bike L3 UE & LE 10 min     Knee/Hip Exercises: Stretches   Passive Hamstring Stretch Limitations seated edge of chair     Knee/Hip Exercises: Seated   Heel Slides Limitations both feet on  towels- alt knee flx/ext   Knee/Hip Flexion alternating marching with quiet set down   Abd/Adduction Limitations coordinated abd/add slides-feet on towels     Shoulder Exercises: Seated   Flexion Limitations + horiz abd pull on yellow tband + pursed lip breathing-forceful exhale     Moist Heat Therapy   Number Minutes Moist Heat 10 Minutes   Moist Heat Location Lumbar Spine                  PT Short Term Goals - 12/19/16 1158      PT SHORT TERM GOAL #1   Title pt will verbalize independence in HEP, moving around multiple times throughout the day   Baseline I have been trying to get up and move more often, not sure if it is once/hour   Status Achieved           PT Long Term Goals - 11/30/16 1735      PT LONG TERM GOAL #1   Title FOTO to 52% limitation to indicate significant improvement in functional ability   Baseline 61% limitation at eval   Time 6   Period Weeks   Status New  Target Date 01/13/17     PT LONG TERM GOAL #2   Title Pt will verbalize averag pain <=4/10 with daily activities   Baseline 7/10 at eval   Time 6   Period Weeks   Status New   Target Date 01/13/17     PT LONG TERM GOAL #3   Title Pt will verbalize improvement in feeling of independence and confidence in ambulation   Baseline feels helpless at eval   Time 6   Period Weeks   Status New   Target Date 01/13/17     PT LONG TERM GOAL #4   Title Will be able to get up and walk after being seated/laying down with minimal-moderate discomfort   Baseline verbalizes severe discomfort and limitation at eval   Time 6   Period Weeks   Status New   Target Date 01/13/17               Plan - 12/21/16 1341    Clinical Impression Statement Increased time on nu step today because pt reported it felt good and was loosening her up. Pt able to tolerate 30 min of exercise and then applied moist heat and pt was monitored. Reported feeling a little better but it's just one of her bad days.  I encouraged her to get up and moving frequently throughout the day.    PT Treatment/Interventions ADLs/Self Care Home Management;Cryotherapy;Functional mobility training;Stair training;Gait training;Moist Heat;Therapeutic activities;Therapeutic exercise;Balance training;Neuromuscular re-education;Patient/family education;Passive range of motion;Manual techniques;Dry needling;Taping   PT Next Visit Plan nu step UE&LE, glut activation; try some standing activities   PT Home Exercise Plan LAQ, seated clam, UE horiz abd, resisted PF.    Consulted and Agree with Plan of Care Patient      Patient will benefit from skilled therapeutic intervention in order to improve the following deficits and impairments:  Abnormal gait, Decreased range of motion, Difficulty walking, Impaired UE functional use, Increased muscle spasms, Decreased activity tolerance, Pain, Improper body mechanics, Impaired flexibility, Hypomobility, Decreased mobility, Decreased strength, Postural dysfunction  Visit Diagnosis: Chronic bilateral low back pain without sciatica  Muscle weakness (generalized)  Difficulty in walking, not elsewhere classified  Abnormal posture     Problem List Patient Active Problem List   Diagnosis Date Noted  . Osteoporosis 11/24/2016  . Skin cancer of nose 11/23/2016  . 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 UTI (urinary tract infection) 10/26/2016  . Paroxysmal A-fib ([place]) 08/05/2016  . AKI (acute kidney injury) ([place]) 05/25/2016  . Essential hypertension 05/05/2016  . Type 2 diabetes mellitus without complication, without long-term current use of insulin ([place]) 05/05/2016  . Hiatal hernia 05/05/2016  . History of CVA (cerebrovascular accident) without residual deficits 05/05/2016  . Memory change 05/05/2016  . AICD (automatic cardioverter/defibrillator) present 05/05/2016  . Back pain 05/05/2016   Jame Seelig C. Siyana Erney PT, DPT 12/21/16 2:21 PM   [place]  [facility] [address] [place], [place], [address] Phone: [phone]   Fax:  [phone]  Name: Brenda [name] MRN: [id number] Date of Birth: Nov 25, 1931

## 2016-12-21 NOTE — Telephone Encounter (Signed)
Patient's husband informed that placard form is complete and ready for pickup.  Derl Barrow, RN

## 2016-12-26 ENCOUNTER — Ambulatory Visit: Payer: Medicare Other | Attending: Family Medicine | Admitting: Physical Therapy

## 2016-12-26 DIAGNOSIS — R293 Abnormal posture: Secondary | ICD-10-CM | POA: Insufficient documentation

## 2016-12-26 DIAGNOSIS — G8929 Other chronic pain: Secondary | ICD-10-CM | POA: Diagnosis not present

## 2016-12-26 DIAGNOSIS — H903 Sensorineural hearing loss, bilateral: Secondary | ICD-10-CM | POA: Insufficient documentation

## 2016-12-26 DIAGNOSIS — M545 Other chronic pain: Secondary | ICD-10-CM

## 2016-12-26 DIAGNOSIS — Z974 Presence of external hearing-aid: Secondary | ICD-10-CM | POA: Insufficient documentation

## 2016-12-26 DIAGNOSIS — M6281 Muscle weakness (generalized): Secondary | ICD-10-CM | POA: Diagnosis not present

## 2016-12-26 DIAGNOSIS — H93299 Other abnormal auditory perceptions, unspecified ear: Secondary | ICD-10-CM | POA: Insufficient documentation

## 2016-12-26 DIAGNOSIS — H918X3 Other specified hearing loss, bilateral: Secondary | ICD-10-CM | POA: Diagnosis not present

## 2016-12-26 DIAGNOSIS — R262 Difficulty in walking, not elsewhere classified: Secondary | ICD-10-CM | POA: Insufficient documentation

## 2016-12-26 NOTE — Therapy (Signed)
[place], [place], [address] Phone: [phone]   Fax:  [phone]  Physical Therapy Treatment  Patient Details  Name: Brenda [name] MRN: [id number] Date of Birth: February 11, 1932 Referring Provider: Zenia Resides, MD  Encounter Date: 12/26/2016      PT End of Session - 12/26/16 1242    Visit Number 7   Number of Visits 13   Date for PT Re-Evaluation 01/13/17   Authorization Type MCR- KX at visit 15   PT Start Time 8250   PT Stop Time 1230   PT Time Calculation (min) 43 min      Past Medical History:  Diagnosis Date  . Anemia   . Arthritis   . Basal cell carcinoma    nose  . Diabetes mellitus without complication ([place])    pt denies diabetes  . GERD (gastroesophageal reflux disease)   . Hiatal hernia   . HLD (hyperlipidemia)   . Hypertension   . Seizure ([place]) 2011    Past Surgical History:  Procedure Laterality Date  . CARDIAC DEFIBRILLATOR PLACEMENT    . CATARACT EXTRACTION Left 02/2001  . CHOLECYSTECTOMY    . LUMBAR LAMINECTOMY  06/11/2001  . LUMBAR LAMINECTOMY  02/09/2006  . LUMBAR LAMINECTOMY  07/07/2006   with spianal cord stimulator  . SPINAL CORD STIMULATOR REMOVAL  14/2009  . TUBAL LIGATION      There were no vitals filed for this visit.      Subjective Assessment - 12/26/16 1234    Subjective I feel better.                          [place] Adult PT Treatment/Exercise - 12/26/16 0001      Lumbar Exercises: Aerobic   Stationary Bike L4 UE & LE 10 min     Knee/Hip Exercises: Stretches   Passive Hamstring Stretch Limitations seated edge of chair     Knee/Hip Exercises: Seated   Clamshell with TheraBand Red  x20   Knee/Hip Flexion alternating marching with quiet set down   Other Seated Knee/Hip Exercises resisted PF, green tband     Shoulder Exercises: Seated   Row Theraband;20 reps   Theraband Level (Shoulder Row) Level 1 (Yellow)   Horizontal ABduction 10 reps   Theraband Level (Shoulder Horizontal ABduction) Level 1 (Yellow)   External Rotation Both;15 reps   Theraband Level (Shoulder External Rotation) Level 1 (Yellow)                  PT Short Term Goals - 12/19/16 1158      PT SHORT TERM GOAL #1   Title pt will verbalize independence in HEP, moving around multiple times throughout the day   Baseline I have been trying to get up and move more often, not sure if it is once/hour   Status Achieved           PT Long Term Goals - 11/30/16 1735      PT LONG TERM GOAL #1   Title FOTO to 52% limitation to indicate significant improvement in functional ability   Baseline 61% limitation at eval   Time 6   Period Weeks   Status New   Target Date 01/13/17     PT LONG TERM GOAL #2   Title Pt will verbalize averag pain <=4/10 with daily activities   Baseline 7/10 at eval   Time 6   Period Weeks   Status  New   Target Date 01/13/17     PT LONG TERM GOAL #3   Title Pt will verbalize improvement in feeling of independence and confidence in ambulation   Baseline feels helpless at eval   Time 6   Period Weeks   Status New   Target Date 01/13/17     PT LONG TERM GOAL #4   Title Will be able to get up and walk after being seated/laying down with minimal-moderate discomfort   Baseline verbalizes severe discomfort and limitation at eval   Time 6   Period Weeks   Status New   Target Date 01/13/17               Plan - 12/26/16 1234    Clinical Impression Statement Pt reports she walked around the outside of her house yesterday using RW without her legs feeling tired. She did have 6/10 LBP but it was not as severe as usual. She reports no change in pain with transfers. Reviewed HEP, pt requires cues for technique with theraband clams and PF. At end of session pt reports she is not as fatigues as usual.    PT Next Visit Plan nu step UE&LE, glut activation; try some standing activities   PT Home  Exercise Plan LAQ, seated clam, UE horiz abd, resisted PF.    Consulted and Agree with Plan of Care Patient      Patient will benefit from skilled therapeutic intervention in order to improve the following deficits and impairments:  Abnormal gait, Decreased range of motion, Difficulty walking, Impaired UE functional use, Increased muscle spasms, Decreased activity tolerance, Pain, Improper body mechanics, Impaired flexibility, Hypomobility, Decreased mobility, Decreased strength, Postural dysfunction  Visit Diagnosis: Chronic bilateral low back pain without sciatica  Difficulty in walking, not elsewhere classified  Abnormal posture     Problem List Patient Active Problem List   Diagnosis Date Noted  . Osteoporosis 11/24/2016  . Skin cancer of nose 11/23/2016  . 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 UTI (urinary tract infection) 10/26/2016  . Paroxysmal A-fib ([place]) 08/05/2016  . AKI (acute kidney injury) ([place]) 05/25/2016  . Essential hypertension 05/05/2016  . Type 2 diabetes mellitus without complication, without long-term current use of insulin ([place]) 05/05/2016  . Hiatal hernia 05/05/2016  . History of CVA (cerebrovascular accident) without residual deficits 05/05/2016  . Memory change 05/05/2016  . AICD (automatic cardioverter/defibrillator) present 05/05/2016  . Back pain 05/05/2016    Dorene Ar, PTA 12/26/2016, 12:46 PM  [facility] [address] [place], [place], [address] Phone: [phone]   Fax:  [phone]  Name: Derricka Mertz MRN: [id number] Date of Birth: April 28, 1931

## 2016-12-28 ENCOUNTER — Encounter: Payer: Self-pay | Admitting: Physical Therapy

## 2016-12-28 ENCOUNTER — Ambulatory Visit: Payer: Medicare Other | Admitting: Physical Therapy

## 2016-12-28 DIAGNOSIS — M6281 Muscle weakness (generalized): Secondary | ICD-10-CM

## 2016-12-28 DIAGNOSIS — R293 Abnormal posture: Secondary | ICD-10-CM | POA: Diagnosis not present

## 2016-12-28 DIAGNOSIS — G8929 Other chronic pain: Secondary | ICD-10-CM | POA: Diagnosis not present

## 2016-12-28 DIAGNOSIS — H903 Sensorineural hearing loss, bilateral: Secondary | ICD-10-CM | POA: Diagnosis not present

## 2016-12-28 DIAGNOSIS — M545 Other chronic pain: Secondary | ICD-10-CM

## 2016-12-28 DIAGNOSIS — H918X3 Other specified hearing loss, bilateral: Secondary | ICD-10-CM | POA: Diagnosis not present

## 2016-12-28 DIAGNOSIS — R262 Difficulty in walking, not elsewhere classified: Secondary | ICD-10-CM

## 2016-12-28 NOTE — Therapy (Signed)
[place], [place], [address] Phone: [phone]   Fax:  [phone]  Physical Therapy Treatment  Patient Details  Name: Brenda [name] MRN: [id number] Date of Birth: [birth year] Referring Provider: Zenia Resides, MD  Encounter Date: 12/28/2016      PT End of Session - 12/28/16 1142    Visit Number 8   Number of Visits 13   Date for PT Re-Evaluation 01/13/17   Authorization Type MCR- KX at visit 15   PT Start Time 3557   PT Stop Time 1227   PT Time Calculation (min) 42 min   Activity Tolerance Patient tolerated treatment well   Behavior During Therapy [facility] for tasks assessed/performed      Past Medical History:  Diagnosis Date  . Anemia   . Arthritis   . Basal cell carcinoma    nose  . Diabetes mellitus without complication ([place])    pt denies diabetes  . GERD (gastroesophageal reflux disease)   . Hiatal hernia   . HLD (hyperlipidemia)   . Hypertension   . Seizure ([place]) 2011    Past Surgical History:  Procedure Laterality Date  . CARDIAC DEFIBRILLATOR PLACEMENT    . CATARACT EXTRACTION Left 02/2001  . CHOLECYSTECTOMY    . LUMBAR LAMINECTOMY  06/11/2001  . LUMBAR LAMINECTOMY  02/09/2006  . LUMBAR LAMINECTOMY  07/07/2006   with spianal cord stimulator  . SPINAL CORD STIMULATOR REMOVAL  14/2009  . TUBAL LIGATION      There were no vitals filed for this visit.      Subjective Assessment - 12/28/16 1145    Subjective Feeling pretty good today. Feels sore after doing more walking. I think I got up too quick and I had to lay back down.    Patient Stated Goals learn to walk again (wants to be off of walker)   Currently in Pain? Yes   Pain Score 5    Pain Location Back   Pain Orientation Lower   Pain Descriptors / Indicators Sore   Aggravating Factors  standing from a chair   Pain Relieving Factors moving around                         [facility] Adult PT  Treatment/Exercise - 12/28/16 0001      Therapeutic Activites    Therapeutic Activities Other Therapeutic Activities   Other Therapeutic Activities sit<>stand     Lumbar Exercises: Aerobic   Stationary Bike L5 UE & LE     Manual Therapy   Manual Therapy Soft tissue mobilization   Soft tissue mobilization IASTM bilat QL, laying in sidelying                PT Education - 12/28/16 1235    Education provided Yes   Education Details importance of using legs/gluts to stand, rationale for manual therapy, monitored rest breaks   Person(s) Educated Patient   Methods Explanation;Demonstration;Tactile cues;Verbal cues   Comprehension Verbalized understanding;Returned demonstration;Verbal cues required;Tactile cues required;Need further instruction          PT Short Term Goals - 12/19/16 1158      PT SHORT TERM GOAL #1   Title pt will verbalize independence in HEP, moving around multiple times throughout the day   Baseline I have been trying to get up and move more often, not sure if it is once/hour   Status Achieved  PT Long Term Goals - 11/30/16 1735      PT LONG TERM GOAL #1   Title FOTO to 52% limitation to indicate significant improvement in functional ability   Baseline 61% limitation at eval   Time 6   Period Weeks   Status New   Target Date 01/13/17     PT LONG TERM GOAL #2   Title Pt will verbalize averag pain <=4/10 with daily activities   Baseline 7/10 at eval   Time 6   Period Weeks   Status New   Target Date 01/13/17     PT LONG TERM GOAL #3   Title Pt will verbalize improvement in feeling of independence and confidence in ambulation   Baseline feels helpless at eval   Time 6   Period Weeks   Status New   Target Date 01/13/17     PT LONG TERM GOAL #4   Title Will be able to get up and walk after being seated/laying down with minimal-moderate discomfort   Baseline verbalizes severe discomfort and limitation at eval   Time 6   Period  Weeks   Status New   Target Date 01/13/17               Plan - 12/28/16 1232    Clinical Impression Statement Pt reported back felt a little better after treatment. Tends to leave knees bent when standing from chair and attempting to extend through lumbar spine. Worked on sit<>stand technique to reduce LBP in the motion.    PT Treatment/Interventions ADLs/Self Care Home Management;Cryotherapy;Functional mobility training;Stair training;Gait training;Moist Heat;Therapeutic activities;Therapeutic exercise;Balance training;Neuromuscular re-education;Patient/family education;Passive range of motion;Manual techniques;Dry needling;Taping   PT Next Visit Plan nu step UE&LE, glut activation; try some standing activities   PT Home Exercise Plan LAQ, seated clam, UE horiz abd, resisted PF. extend knees to stand   Consulted and Agree with Plan of Care Patient      Patient will benefit from skilled therapeutic intervention in order to improve the following deficits and impairments:  Abnormal gait, Decreased range of motion, Difficulty walking, Impaired UE functional use, Increased muscle spasms, Decreased activity tolerance, Pain, Improper body mechanics, Impaired flexibility, Hypomobility, Decreased mobility, Decreased strength, Postural dysfunction  Visit Diagnosis: Chronic bilateral low back pain without sciatica  Difficulty in walking, not elsewhere classified  Abnormal posture  Muscle weakness (generalized)     Problem List Patient Active Problem List   Diagnosis Date Noted  . Osteoporosis 11/24/2016  . Skin cancer of nose 11/23/2016  . 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 UTI (urinary tract infection) 10/26/2016  . Paroxysmal A-fib ([place]) 08/05/2016  . AKI (acute kidney injury) ([place]) 05/25/2016  . Essential hypertension 05/05/2016  . Type 2 diabetes mellitus without complication, without long-term current use of insulin ([place]) 05/05/2016  . Hiatal hernia 05/05/2016  . History of  CVA (cerebrovascular accident) without residual deficits 05/05/2016  . Memory change 05/05/2016  . AICD (automatic cardioverter/defibrillator) present 05/05/2016  . Back pain 05/05/2016   Amritha Yorke C. Maui Ahart PT, DPT 12/28/16 12:35 PM   [facility] [address] [place], [place], [address] Phone: [phone]   Fax:  [phone]  Name: Brenda [name] MRN: [id number] Date of Birth: 18-Jan-1932

## 2017-01-02 ENCOUNTER — Ambulatory Visit: Payer: Medicare Other | Admitting: Physical Therapy

## 2017-01-02 DIAGNOSIS — R293 Abnormal posture: Secondary | ICD-10-CM | POA: Diagnosis not present

## 2017-01-02 DIAGNOSIS — G8929 Other chronic pain: Secondary | ICD-10-CM

## 2017-01-02 DIAGNOSIS — H903 Sensorineural hearing loss, bilateral: Secondary | ICD-10-CM | POA: Diagnosis not present

## 2017-01-02 DIAGNOSIS — H918X3 Other specified hearing loss, bilateral: Secondary | ICD-10-CM | POA: Diagnosis not present

## 2017-01-02 DIAGNOSIS — M545 Other chronic pain: Secondary | ICD-10-CM

## 2017-01-02 DIAGNOSIS — R262 Difficulty in walking, not elsewhere classified: Secondary | ICD-10-CM

## 2017-01-02 DIAGNOSIS — M6281 Muscle weakness (generalized): Secondary | ICD-10-CM

## 2017-01-02 NOTE — Therapy (Signed)
[place], [place], [address] Phone: [phone]   Fax:  [phone]  Physical Therapy Treatment  Patient Details  Name: Brenda [name] MRN: [id number] Date of Birth: [birth year] Referring Provider: Zenia Resides, MD  Encounter Date: 01/02/2017      PT End of Session - 01/02/17 1257    Visit Number 9   Number of Visits 13   Date for PT Re-Evaluation 01/13/17   Authorization Type MCR- KX at visit 15   PT Start Time 6962   PT Stop Time 9528   PT Time Calculation (min) 55 min      Past Medical History:  Diagnosis Date  . Anemia   . Arthritis   . Basal cell carcinoma    nose  . Diabetes mellitus without complication ([place])    pt denies diabetes  . GERD (gastroesophageal reflux disease)   . Hiatal hernia   . HLD (hyperlipidemia)   . Hypertension   . Seizure ([place]) 2011    Past Surgical History:  Procedure Laterality Date  . CARDIAC DEFIBRILLATOR PLACEMENT    . CATARACT EXTRACTION Left 02/2001  . CHOLECYSTECTOMY    . LUMBAR LAMINECTOMY  06/11/2001  . LUMBAR LAMINECTOMY  02/09/2006  . LUMBAR LAMINECTOMY  07/07/2006   with spianal cord stimulator  . SPINAL CORD STIMULATOR REMOVAL  14/2009  . TUBAL LIGATION      There were no vitals filed for this visit.      Subjective Assessment - 01/02/17 1255    Subjective I've been walking with the cat on the leash around the house. I stay out for an hour or more.    Currently in Pain? Yes   Pain Score 5    Pain Location Back   Pain Orientation Lower   Pain Descriptors / Indicators Discomfort                         [facility] Adult PT Treatment/Exercise - 01/02/17 0001      Lumbar Exercises: Aerobic   Stationary Bike L5 UE & LE     Lumbar Exercises: Standing   Heel Raises 10 reps   Heel Raises Limitations toe raises x 10    Other Standing Lumbar Exercises side stepping left and right along counter    Other Standing Lumbar  Exercises 3 way hip x 10 each      Lumbar Exercises: Seated   Sit to Stand Limitations 3 reps, needs UE    Other Seated Lumbar Exercises seated clam red band      Knee/Hip Exercises: Stretches   Gastroc Stretch Limitations 15 sec x 2 each- big strech slant board, one at a time     Moist Heat Therapy   Number Minutes Moist Heat 10 Minutes   Moist Heat Location Lumbar Spine                  PT Short Term Goals - 12/19/16 1158      PT SHORT TERM GOAL #1   Title pt will verbalize independence in HEP, moving around multiple times throughout the day   Baseline I have been trying to get up and move more often, not sure if it is once/hour   Status Achieved           PT Long Term Goals - 11/30/16 1735      PT LONG TERM GOAL #1   Title FOTO to 52% limitation to indicate significant  improvement in functional ability   Baseline 61% limitation at eval   Time 6   Period Weeks   Status New   Target Date 01/13/17     PT LONG TERM GOAL #2   Title Pt will verbalize averag pain <=4/10 with daily activities   Baseline 7/10 at eval   Time 6   Period Weeks   Status New   Target Date 01/13/17     PT LONG TERM GOAL #3   Title Pt will verbalize improvement in feeling of independence and confidence in ambulation   Baseline feels helpless at eval   Time 6   Period Weeks   Status New   Target Date 01/13/17     PT LONG TERM GOAL #4   Title Will be able to get up and walk after being seated/laying down with minimal-moderate discomfort   Baseline verbalizes severe discomfort and limitation at eval   Time 6   Period Weeks   Status New   Target Date 01/13/17               Plan - 01/02/17 1335    Clinical Impression Statement Increased challenge today with closed chain exercises. Pt requires UE assist for all standing exercises. Pt reports increased LBP at end pf treament with hip extension exercise otherwise pain remained a 5/10 throughout.    PT Next Visit Plan nu  step UE&LE, glut activation; try some standing activities (assess tolerance to last visit)    PT Home Exercise Plan LAQ, seated clam, UE horiz abd, resisted PF. extend knees to stand   Consulted and Agree with Plan of Care Patient      Patient will benefit from skilled therapeutic intervention in order to improve the following deficits and impairments:  Abnormal gait, Decreased range of motion, Difficulty walking, Impaired UE functional use, Increased muscle spasms, Decreased activity tolerance, Pain, Improper body mechanics, Impaired flexibility, Hypomobility, Decreased mobility, Decreased strength, Postural dysfunction  Visit Diagnosis: Chronic bilateral low back pain without sciatica  Difficulty in walking, not elsewhere classified  Abnormal posture  Muscle weakness (generalized)     Problem List Patient Active Problem List   Diagnosis Date Noted  . Osteoporosis 11/24/2016  . Skin cancer of nose 11/23/2016  . 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 UTI (urinary tract infection) 10/26/2016  . Paroxysmal A-fib ([place]) 08/05/2016  . AKI (acute kidney injury) ([place]) 05/25/2016  . Essential hypertension 05/05/2016  . Type 2 diabetes mellitus without complication, without long-term current use of insulin ([place]) 05/05/2016  . Hiatal hernia 05/05/2016  . History of CVA (cerebrovascular accident) without residual deficits 05/05/2016  . Memory change 05/05/2016  . AICD (automatic cardioverter/defibrillator) present 05/05/2016  . Back pain 05/05/2016    Dorene Ar, PTA 01/02/2017, 1:50 PM  [place], [place], [address] Phone: [phone]   Fax:  [phone]  Name: Brenda [name] MRN: [id number] Date of Birth: May 01, 1931

## 2017-01-04 ENCOUNTER — Other Ambulatory Visit: Payer: Self-pay | Admitting: *Deleted

## 2017-01-04 ENCOUNTER — Ambulatory Visit: Payer: Medicare Other | Admitting: Physical Therapy

## 2017-01-04 DIAGNOSIS — M545 Other chronic pain: Secondary | ICD-10-CM

## 2017-01-04 DIAGNOSIS — R293 Abnormal posture: Secondary | ICD-10-CM

## 2017-01-04 DIAGNOSIS — R262 Difficulty in walking, not elsewhere classified: Secondary | ICD-10-CM | POA: Diagnosis not present

## 2017-01-04 DIAGNOSIS — G8929 Other chronic pain: Secondary | ICD-10-CM | POA: Diagnosis not present

## 2017-01-04 DIAGNOSIS — H903 Sensorineural hearing loss, bilateral: Secondary | ICD-10-CM | POA: Diagnosis not present

## 2017-01-04 DIAGNOSIS — H918X3 Other specified hearing loss, bilateral: Secondary | ICD-10-CM | POA: Diagnosis not present

## 2017-01-04 DIAGNOSIS — M6281 Muscle weakness (generalized): Secondary | ICD-10-CM

## 2017-01-04 NOTE — Therapy (Signed)
[place], [place], [address] Phone: [phone]   Fax:  [phone]  Physical Therapy Treatment  Patient Details  Name: Brenda [name] MRN: [id number] Date of Birth: September 15, 1931 Referring Provider: Zenia Resides, MD  Encounter Date: 01/04/2017      PT End of Session - 01/04/17 1311    Visit Number 10   Number of Visits 13   Date for PT Re-Evaluation 01/13/17   Authorization Type MCR- KX at visit 15   PT Start Time 0105   PT Stop Time 0145   PT Time Calculation (min) 40 min      Past Medical History:  Diagnosis Date  . Anemia   . Arthritis   . Basal cell carcinoma    nose  . Diabetes mellitus without complication ([place])    pt denies diabetes  . GERD (gastroesophageal reflux disease)   . Hiatal hernia   . HLD (hyperlipidemia)   . Hypertension   . Seizure ([place]) 2011    Past Surgical History:  Procedure Laterality Date  . CARDIAC DEFIBRILLATOR PLACEMENT    . CATARACT EXTRACTION Left 02/2001  . CHOLECYSTECTOMY    . LUMBAR LAMINECTOMY  06/11/2001  . LUMBAR LAMINECTOMY  02/09/2006  . LUMBAR LAMINECTOMY  07/07/2006   with spianal cord stimulator  . SPINAL CORD STIMULATOR REMOVAL  14/2009  . TUBAL LIGATION      There were no vitals filed for this visit.      Subjective Assessment - 01/04/17 1310    Subjective I was sore after last treatment but not bad. The pain did not increase.   Currently in Pain? Yes   Pain Score 6    Pain Location Back   Pain Orientation Lower   Pain Descriptors / Indicators Discomfort  like something is rubbing against my spine   Pain Type Chronic pain            [facility] PT Assessment - 01/04/17 0001      Observation/Other Assessments   Focus on Therapeutic Outcomes (FOTO)  01/02/2017-status 65% limited                      [facility] Adult PT Treatment/Exercise - 01/04/17 0001      Lumbar Exercises: Aerobic   Stationary Bike L5 UE &  LE     Lumbar Exercises: Standing   Heel Raises 15 reps   Heel Raises Limitations toe raises x 15   Other Standing Lumbar Exercises side stepping left and right along counter    Other Standing Lumbar Exercises 3 way hip x 10 each      Lumbar Exercises: Seated   Sit to Stand Limitations 3 reps, needs UE    Other Seated Lumbar Exercises seated clam red band , seated ball squeeze      Knee/Hip Exercises: Stretches   Passive Hamstring Stretch Limitations seated edge of chair   Gastroc Stretch Limitations 15 sec x 2 each- big strech, one at a time   slant board      Knee/Hip Exercises: Seated   Long Arc Quad 10 reps;Both   Long Arc Quad Weight 2 lbs.     Shoulder Exercises: Seated   Horizontal ABduction 10 reps   Theraband Level (Shoulder Horizontal ABduction) Level 1 (Yellow)   External Rotation Both;15 reps   Theraband Level (Shoulder External Rotation) Level 1 (Yellow)     Moist Heat Therapy   Number Minutes Moist Heat 10 Minutes  Moist Heat Location Lumbar Spine                  PT Short Term Goals - 12/19/16 1158      PT SHORT TERM GOAL #1   Title pt will verbalize independence in HEP, moving around multiple times throughout the day   Baseline I have been trying to get up and move more often, not sure if it is once/hour   Status Achieved           PT Long Term Goals - 01/04/17 1319      PT LONG TERM GOAL #1   Title FOTO to 52% limitation to indicate significant improvement in functional ability   Baseline 61% limitation at eval, status 65% limited 01/02/17   Time 6   Period Weeks   Status On-going     PT LONG TERM GOAL #2   Title Pt will verbalize averag pain <=4/10 with daily activities   Baseline 5/10   Time 6   Period Weeks   Status On-going     PT LONG TERM GOAL #3   Title Pt will verbalize improvement in feeling of independence and confidence in ambulation   Baseline walking outside daily    Time 6   Period Weeks   Status On-going      PT LONG TERM GOAL #4   Title Will be able to get up and walk after being seated/laying down with minimal-moderate discomfort   Baseline much better   Time 6   Period Weeks   Status Partially Met               Plan - 01/04/17 1312    Clinical Impression Statement Pt reports improved ability to stand and walk after sitting or lying down. She reports average pain level has decreased from 7/10 to 5/10. She does still have some spiles in pain.  Her FOTO score did not show improvement. She is progressing toward LTGs. Continued closed chain challenges with pt reporting increased LB pain during heel raises, gastroc stretching and seated yellow band scapular exercises. Rest breaks given a needed and pt monitored throughout treatment. HMP at end session.    PT Next Visit Plan nu step UE&LE, glut activation; try some standing activities (assess tolerance to last visit)    PT Home Exercise Plan LAQ, seated clam, UE horiz abd, resisted PF. extend knees to stand   Consulted and Agree with Plan of Care Patient      Patient will benefit from skilled therapeutic intervention in order to improve the following deficits and impairments:  Abnormal gait, Decreased range of motion, Difficulty walking, Impaired UE functional use, Increased muscle spasms, Decreased activity tolerance, Pain, Improper body mechanics, Impaired flexibility, Hypomobility, Decreased mobility, Decreased strength, Postural dysfunction  Visit Diagnosis: Chronic bilateral low back pain without sciatica  Difficulty in walking, not elsewhere classified  Abnormal posture  Muscle weakness (generalized)     Problem List Patient Active Problem List   Diagnosis Date Noted  . Osteoporosis 11/24/2016  . Skin cancer of nose 11/23/2016  . 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 UTI (urinary tract infection) 10/26/2016  . Paroxysmal A-fib ([place]) 08/05/2016  . AKI (acute kidney injury) ([place]) 05/25/2016  . Essential hypertension 05/05/2016   . Type 2 diabetes mellitus without complication, without long-term current use of insulin ([place]) 05/05/2016  . Hiatal hernia 05/05/2016  . History of CVA (cerebrovascular accident) without residual deficits 05/05/2016  . Memory change 05/05/2016  . AICD (automatic cardioverter/defibrillator)  present 05/05/2016  . Back pain 05/05/2016    Dorene Ar, PTA 01/04/2017, 1:56 PM  [place], [place], [address] Phone: [phone]   Fax:  [phone]  Name: Nahiara Kretzschmar MRN: [id number] Date of Birth: [birth year]

## 2017-01-06 MED ORDER — "INSULIN SYRINGE-NEEDLE U-100 26G X 1/2"" 1 ML MISC": 1.0000 [IU] | Freq: Four times a day (QID) | 11 refills | Status: DC | PRN

## 2017-01-06 MED ORDER — LISINOPRIL 10 MG PO TABS: 10.0000 mg | ORAL_TABLET | Freq: Every day | ORAL | 1 refills | Status: DC

## 2017-01-06 NOTE — Telephone Encounter (Signed)
Will not refill xarelto or metoprolol, cardiology is managing these.

## 2017-01-09 ENCOUNTER — Ambulatory Visit: Payer: Medicare Other | Admitting: Physical Therapy

## 2017-01-09 DIAGNOSIS — H918X3 Other specified hearing loss, bilateral: Secondary | ICD-10-CM | POA: Diagnosis not present

## 2017-01-09 DIAGNOSIS — G8929 Other chronic pain: Secondary | ICD-10-CM | POA: Diagnosis not present

## 2017-01-09 DIAGNOSIS — R293 Abnormal posture: Secondary | ICD-10-CM | POA: Diagnosis not present

## 2017-01-09 DIAGNOSIS — R262 Difficulty in walking, not elsewhere classified: Secondary | ICD-10-CM | POA: Diagnosis not present

## 2017-01-09 DIAGNOSIS — H903 Sensorineural hearing loss, bilateral: Secondary | ICD-10-CM | POA: Diagnosis not present

## 2017-01-09 DIAGNOSIS — M545 Other chronic pain: Secondary | ICD-10-CM

## 2017-01-09 DIAGNOSIS — M6281 Muscle weakness (generalized): Secondary | ICD-10-CM

## 2017-01-09 NOTE — Patient Instructions (Addendum)
   ABDUCTION: Standing (Active)   Stand, feet flat. Lift right leg out to side. Use _0__ lbs. Complete __10_ repetitions. Perform __2_ sessions per day.         EXTENSION: Standing (Active)  Stand, both feet flat. Draw right leg behind body as far as possible. Use 0___ lbs. Complete 10 repetitions. Perform __2_ sessions per day.  Copyright  VHI. All rights reserved.   Toe / Heel Raise    Gently rock back on heels and raise toes. Then rock forward on toes and raise heels. Repeat sequence ___10-20_ times per session. Do ___2_ sessions per week.
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
## 2017-01-11 ENCOUNTER — Ambulatory Visit: Payer: Medicare Other | Admitting: Physical Therapy

## 2017-01-11 ENCOUNTER — Encounter: Payer: Self-pay | Admitting: Physical Therapy

## 2017-01-11 DIAGNOSIS — R293 Abnormal posture: Secondary | ICD-10-CM

## 2017-01-11 DIAGNOSIS — R262 Difficulty in walking, not elsewhere classified: Secondary | ICD-10-CM

## 2017-01-11 DIAGNOSIS — M6281 Muscle weakness (generalized): Secondary | ICD-10-CM

## 2017-01-11 DIAGNOSIS — H918X3 Other specified hearing loss, bilateral: Secondary | ICD-10-CM | POA: Diagnosis not present

## 2017-01-11 DIAGNOSIS — M545 Other chronic pain: Secondary | ICD-10-CM

## 2017-01-11 DIAGNOSIS — G8929 Other chronic pain: Secondary | ICD-10-CM | POA: Diagnosis not present

## 2017-01-11 DIAGNOSIS — H903 Sensorineural hearing loss, bilateral: Secondary | ICD-10-CM | POA: Diagnosis not present

## 2017-01-11 NOTE — Therapy (Signed)
[place], [place], [address] Phone: [phone]   Fax:  [phone]  Physical Therapy Treatment/Discharge Summary  Patient Details  Name: Brenda [name] MRN: [id number] Date of Birth: February 19, 1932 Referring Provider: Zenia Resides, MD  Encounter Date: 01/11/2017      PT End of Session - 01/11/17 1148    Visit Number 12   Number of Visits 13   Date for PT Re-Evaluation 01/13/17   Authorization Type MCR- KX at visit 15   PT Start Time 9242   PT Stop Time 1225   PT Time Calculation (min) 40 min   Activity Tolerance Patient tolerated treatment well   Behavior During Therapy [facility] for tasks assessed/performed      Past Medical History:  Diagnosis Date  . Anemia   . Arthritis   . Basal cell carcinoma    nose  . Diabetes mellitus without complication ([place])    pt denies diabetes  . GERD (gastroesophageal reflux disease)   . Hiatal hernia   . HLD (hyperlipidemia)   . Hypertension   . Seizure ([place]) 2011    Past Surgical History:  Procedure Laterality Date  . CARDIAC DEFIBRILLATOR PLACEMENT    . CATARACT EXTRACTION Left 02/2001  . CHOLECYSTECTOMY    . LUMBAR LAMINECTOMY  06/11/2001  . LUMBAR LAMINECTOMY  02/09/2006  . LUMBAR LAMINECTOMY  07/07/2006   with spianal cord stimulator  . SPINAL CORD STIMULATOR REMOVAL  14/2009  . TUBAL LIGATION      There were no vitals filed for this visit.          [facility] PT Assessment - 01/11/17 0001      Assessment   Medical Diagnosis LBP   Referring Provider Zenia Resides, MD     Observation/Other Assessments   Focus on Therapeutic Outcomes (FOTO)  51% limited     AROM   Overall AROM Comments cont flexion at lumbar spine, unable to stand upright; GHJ flx 130 bilat     Strength   Overall Strength Comments gross UE & LE strength 4-4+/5                     [facility] Adult PT Treatment/Exercise - 01/11/17 0001      Lumbar  Exercises: Aerobic   Stationary Bike L5 UE & LE 10 min     Lumbar Exercises: Seated   Other Seated Lumbar Exercises seated ball squeeze     Knee/Hip Exercises: Seated   Long Arc Quad 10 reps;Both   Clamshell with TheraBand Green   Other Seated Knee/Hip Exercises resisted PF, green tband     Shoulder Exercises: Seated   Retraction Limitations scapular retraction for resting posture   Horizontal ABduction 10 reps   Theraband Level (Shoulder Horizontal ABduction) Level 1 (Yellow)                PT Education - 01/11/17 1239    Education provided Yes   Education Details FOTO, review of goals & MMT, importance of continued HEP   Methods Explanation;Demonstration;Tactile cues;Verbal cues   Comprehension Verbalized understanding;Returned demonstration;Verbal cues required;Tactile cues required          PT Short Term Goals - 12/19/16 1158      PT SHORT TERM GOAL #1   Title pt will verbalize independence in HEP, moving around multiple times throughout the day   Baseline I have been trying to get up and move more often, not sure if it is once/hour  Status Achieved           PT Long Term Goals - 02/09/2017 1148      PT LONG TERM GOAL #1   Title FOTO to 52% limitation to indicate significant improvement in functional ability   Baseline 51% limitation    Status Achieved     PT LONG TERM GOAL #2   Title Pt will verbalize averag pain <=4/10 with daily activities   Baseline average 4/10   Status Achieved     PT LONG TERM GOAL #3   Title Pt will verbalize improvement in feeling of independence and confidence in ambulation   Baseline feels confident when using walker, walking more often   Status Achieved     PT LONG TERM GOAL #4   Title Will be able to get up and walk after being seated/laying down with minimal-moderate discomfort   Baseline reports "I feel good, I can get up and walk"   Status Achieved               Plan - 02-09-17 1238    Clinical  Impression Statement Pt has met all of her goals at this time and is being d/c to independent program. Pt verbalized readiness and understanding of HEP. Was encouraged to contact us with any further questions.    PT Treatment/Interventions ADLs/Self Care Home Management;Cryotherapy;Functional mobility training;Stair training;Gait training;Moist Heat;Therapeutic activities;Therapeutic exercise;Balance training;Neuromuscular re-education;Patient/family education;Passive range of motion;Manual techniques;Dry needling;Taping   Consulted and Agree with Plan of Care Patient      Patient will benefit from skilled therapeutic intervention in order to improve the following deficits and impairments:  Abnormal gait, Decreased range of motion, Difficulty walking, Impaired UE functional use, Increased muscle spasms, Decreased activity tolerance, Pain, Improper body mechanics, Impaired flexibility, Hypomobility, Decreased mobility, Decreased strength, Postural dysfunction  Visit Diagnosis: Chronic bilateral low back pain without sciatica  Difficulty in walking, not elsewhere classified  Abnormal posture  Muscle weakness (generalized)       G-Codes - Feb 09, 2017 1239    Functional Assessment Tool Used (Outpatient Only) FOTO,  clinical judgement   Functional Limitation Mobility: Walking and moving around   Mobility: Walking and Moving Around Goal Status [phone]) At least 40 percent but less than 60 percent impaired, limited or restricted   Mobility: Walking and Moving Around Discharge Status [phone]) At least 40 percent but less than 60 percent impaired, limited or restricted      Problem List Patient Active Problem List   Diagnosis Date Noted  . Osteoporosis 11/24/2016  . Skin cancer of nose 11/23/2016  . 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 UTI (urinary tract infection) 10/26/2016  . Paroxysmal A-fib ([place]) 08/05/2016  . AKI (acute kidney injury) ([place]) 05/25/2016  . Essential hypertension 05/05/2016   . Type 2 diabetes mellitus without complication, without long-term current use of insulin ([place]) 05/05/2016  . Hiatal hernia 05/05/2016  . History of CVA (cerebrovascular accident) without residual deficits 05/05/2016  . Memory change 05/05/2016  . AICD (automatic cardioverter/defibrillator) present 05/05/2016  . Back pain 05/05/2016    PHYSICAL THERAPY DISCHARGE SUMMARY  Visits from Start of Care: 12  Current functional level related to goals / functional outcomes: See above   Remaining deficits: See above   Education / Equipment: Anatomy of condition, POC, HEP, exercise form/rationale  Plan: Patient agrees to discharge.  Patient goals were met. Patient is being discharged due to meeting the stated rehab goals.  ?????     Britian Jentz C. Jamaira Sherk PT, DPT February 09, 2017 12:40  PM   [place], [place], [address] Phone: [phone]   Fax:  [phone]  Name: Brenda [name] MRN: [id number] Date of Birth: Apr 10, 1931

## 2017-01-13 ENCOUNTER — Ambulatory Visit (INDEPENDENT_AMBULATORY_CARE_PROVIDER_SITE_OTHER): Payer: Medicare Other | Admitting: Family Medicine

## 2017-01-13 ENCOUNTER — Encounter: Payer: Self-pay | Admitting: Family Medicine

## 2017-01-13 DIAGNOSIS — N3281 Overactive bladder: Secondary | ICD-10-CM | POA: Diagnosis present

## 2017-01-13 DIAGNOSIS — I480 Paroxysmal atrial fibrillation: Secondary | ICD-10-CM | POA: Diagnosis not present

## 2017-01-13 DIAGNOSIS — I10 Essential (primary) hypertension: Secondary | ICD-10-CM | POA: Diagnosis not present

## 2017-01-13 MED ORDER — OXYBUTYNIN CHLORIDE ER 5 MG PO TB24: 5.0000 mg | ORAL_TABLET | Freq: Every day | ORAL | 2 refills | Status: DC

## 2017-01-13 MED ORDER — "INSULIN SYRINGE-NEEDLE U-100 26G X 1/2"" 1 ML MISC": 1.0000 [IU] | Freq: Four times a day (QID) | 11 refills | Status: DC | PRN

## 2017-01-13 MED ORDER — LISINOPRIL 10 MG PO TABS: 10.0000 mg | ORAL_TABLET | Freq: Every day | ORAL | 1 refills | Status: DC

## 2017-01-13 NOTE — Patient Instructions (Signed)
It was a pleasure to see you today! Thank you for choosing [facility] for your primary care. Brenda [name] was seen for medication refills. .   Our plans for today were:  Try calling the [facility] about your dermatology referral. [phone]. They may have tried to call you already.   Continue your lisinopril.   Call your cardiologist, Dr. Macky Lower office about refilling your Xarelto. Their number is [phone].   You should return to our clinic to see Dr. Lindell Noe in 6 months for medication management.   Best,  Dr. Lindell Noe

## 2017-01-13 NOTE — Progress Notes (Signed)
   CC: medication refills   HPI Patient presents today wanting medication refills. She did not realize that her pharmacy had requested electronic refills last week. However, she [facility]. She has been taking the oxybutynin for incontinence and feels this is working well. She specifically needs lisinopril and xarelto refills.  ROS: denies CP, SOB, abd pain, dysuria, changes in BMs.    CC, SH/smoking status, and VS noted  Objective: BP 118/64   Pulse 70   Temp 97.7 F (36.5 C) (Oral)   Ht 5\' 1"  (1.549 m)   Wt 130 lb (59 kg)   SpO2 97%   BMI 24.56 kg/m  Gen: NAD, alert, cooperative, and pleasant thin elderly female, walks with walker.  HEENT: NCAT, EOMI, PERRL CV: RRR, no murmur Resp: CTAB, no wheezes, non-labored Ext: No edema, warm Neuro: Alert and oriented, Speech clear, No gross deficits  Assessment and plan:  Essential hypertension Controlled. Continue lisinopril.   Paroxysmal A-fib [facility]) Patient needs xarelto refill (has about 30 pills left in her bottle), but this is prescribed by cardiology. Gave patient and husband phone number to request refill from [facility].   Detrusor instability Oxybutynin has been helpful. Will continue this.    Meds ordered this encounter  Medications  . oxybutynin (DITROPAN-XL) 5 MG 24 hr tablet    Sig: Take 1 tablet (5 mg total) by mouth at bedtime.    Dispense:  30 tablet    Refill:  2  . lisinopril (PRINIVIL,ZESTRIL) 10 MG tablet    Sig: Take 1 tablet (10 mg total) by mouth daily.    Dispense:  90 tablet    Refill:  1  . Insulin Syringe-Needle U-100 26G X 1/2" 1 ML MISC    Sig: 1 Units by Does not apply route 4 (four) times daily as needed.    Dispense:  100 each    Refill:  11    Health Maintenance reviewed - needs Tdap and PNA shots. Will not recheck HgbA1c as patient well controlled recently and goal is <9 given age and co morbidities.   Ralene Ok, MD,  PGY2 01/16/2017 9:16 AM

## 2017-01-16 ENCOUNTER — Ambulatory Visit: Payer: Medicare Other | Admitting: Physical Therapy

## 2017-01-16 ENCOUNTER — Telehealth: Payer: Self-pay

## 2017-01-16 NOTE — Assessment & Plan Note (Signed)
Controlled.  Continue lisinopril 

## 2017-01-16 NOTE — Telephone Encounter (Signed)
Spoke with pt regarding shock from 01/15/2017 at 1230, pt stated that she was fixing something in the kitchen and felt fine and then she felt the shock, informed pt that some changes need to be made to her device pt agreeable to appointment on 10/25 at 10am.

## 2017-01-16 NOTE — Assessment & Plan Note (Signed)
Patient needs xarelto refill (has about 30 pills left in her bottle), but this is prescribed by cardiology. Gave patient and husband phone number to request refill from [facility].

## 2017-01-16 NOTE — Assessment & Plan Note (Signed)
Oxybutynin has been helpful. Will continue this.

## 2017-01-17 ENCOUNTER — Encounter: Payer: Self-pay | Admitting: Family Medicine

## 2017-01-18 ENCOUNTER — Ambulatory Visit: Payer: Medicare Other | Admitting: Physical Therapy

## 2017-01-19 ENCOUNTER — Ambulatory Visit (INDEPENDENT_AMBULATORY_CARE_PROVIDER_SITE_OTHER): Payer: Medicare Other | Admitting: *Deleted

## 2017-01-19 DIAGNOSIS — Z95810 Presence of automatic (implantable) cardiac defibrillator: Secondary | ICD-10-CM | POA: Diagnosis not present

## 2017-01-19 DIAGNOSIS — I472 Ventricular tachycardia, unspecified: Secondary | ICD-10-CM

## 2017-01-19 DIAGNOSIS — I480 Paroxysmal atrial fibrillation: Secondary | ICD-10-CM

## 2017-01-19 LAB — CUP PACEART INCLINIC DEVICE CHECK
Brady Statistic RA Percent Paced: 88 %
Brady Statistic RV Percent Paced: 1 % — CL
Date Time Interrogation Session: 20181025040000
HighPow Impedance: 38 Ohm
HighPow Impedance: 47 Ohm
Implantable Lead Implant Date: 20111110
Implantable Lead Implant Date: 20111110
Implantable Lead Location: 753859
Implantable Lead Location: 753860
Implantable Lead Model: 157
Implantable Lead Model: 4135
Implantable Lead Serial Number: 28803817
Implantable Lead Serial Number: 302861
Implantable Pulse Generator Implant Date: 20111110
Lead Channel Impedance Value: 448 Ohm
Lead Channel Impedance Value: 709 Ohm
Lead Channel Pacing Threshold Amplitude: 0.4 V
Lead Channel Pacing Threshold Amplitude: 0.8 V
Lead Channel Pacing Threshold Pulse Width: 0.4 ms
Lead Channel Pacing Threshold Pulse Width: 0.4 ms
Lead Channel Sensing Intrinsic Amplitude: 11.3 mV
Lead Channel Sensing Intrinsic Amplitude: 3.3 mV
Lead Channel Setting Pacing Amplitude: 2 V
Lead Channel Setting Pacing Amplitude: 2.4 V
Lead Channel Setting Pacing Pulse Width: 0.4 ms
Lead Channel Setting Sensing Sensitivity: 0.6 mV
Pulse Gen Serial Number: 169112

## 2017-01-19 MED ORDER — RIVAROXABAN 15 MG PO TABS: 15.0000 mg | ORAL_TABLET | Freq: Every day | ORAL | 3 refills | Status: DC

## 2017-01-19 NOTE — Progress Notes (Signed)
ICD check in clinic to reprogram tachy therapies due to inappropriate shock for SVT. Thresholds and sensing consistent with previous device measurements. Impedance trends stable over time. 249 VHR episodes. 1 treated VF episode--1:1 SVT at onset, sped up into VF zone, ATP x1 unsuccessful, shock delivered. NSVT evident post-shock but self-terminated, SVT continued and slowed into monitor zone. <1% AT/AF burden, +Xarelto, total time 49.59min. Per WC, increased VT-1 monitor zone to 165bpm, extended VT zone initial duration to 8.0s, increased VF detection rate to 240bpm, and increased VF initial duration to 2.0s. Histogram distribution appropriate for patient and level of activity. Device programmed at appropriate safety margins. Device programmed to optimize intrinsic conduction. Estimated longevity 4 years. Patient enrolled in remote follow-up. Patient education completed including shock plan. Latitude on 02/02/17 and ROV with WC on 05/03/17.

## 2017-01-20 ENCOUNTER — Other Ambulatory Visit: Payer: Self-pay | Admitting: Cardiology

## 2017-01-24 ENCOUNTER — Ambulatory Visit: Payer: Medicare Other | Admitting: Audiology

## 2017-01-25 ENCOUNTER — Ambulatory Visit: Payer: Medicare Other | Admitting: Audiology

## 2017-01-25 DIAGNOSIS — H918X3 Other specified hearing loss, bilateral: Secondary | ICD-10-CM

## 2017-01-25 DIAGNOSIS — H903 Sensorineural hearing loss, bilateral: Secondary | ICD-10-CM

## 2017-01-25 DIAGNOSIS — G8929 Other chronic pain: Secondary | ICD-10-CM | POA: Diagnosis not present

## 2017-01-25 DIAGNOSIS — R293 Abnormal posture: Secondary | ICD-10-CM | POA: Diagnosis not present

## 2017-01-25 DIAGNOSIS — H93299 Other abnormal auditory perceptions, unspecified ear: Secondary | ICD-10-CM

## 2017-01-25 DIAGNOSIS — M545 Low back pain: Secondary | ICD-10-CM | POA: Diagnosis not present

## 2017-01-25 DIAGNOSIS — Z974 Presence of external hearing-aid: Secondary | ICD-10-CM

## 2017-01-25 DIAGNOSIS — IMO0001 Reserved for inherently not codable concepts without codable children: Secondary | ICD-10-CM

## 2017-01-25 DIAGNOSIS — R262 Difficulty in walking, not elsewhere classified: Secondary | ICD-10-CM | POA: Diagnosis not present

## 2017-01-25 NOTE — Procedures (Signed)
[facility]  [place], [place] [address]  [phone]   Audiological Evaluation  Patient Name: Brenda [name]  Status: Outpatient   DOB: 1932-03-10    Diagnosis: Hearing Loss bilateral                Has hearing aids, still has trouble hearing MRN: [id number] Date:  01/25/2017     Referent: Sela Hilding, MD  History: Brenda [name] was seen for an audiological evaluation. She reports trouble hearing with and without hearing aids. [employer] hearing aids purchased new a "little over a year ago in [place]". States "has never heard very well out of them", thinks "needs adjustment".  Accompanied by: her husband Primary Concern: Pain: None History of hearing problems: Y  History of balance issues:  Y - uses a walker   Evaluation: Conventional pure tone audiometry from 250Hz  - 8000Hz  with using insert earphones.  Hearing Thresholds appear sensorineural bilaterally, but are poorer on the left side. : Right ear:  Thresholds of 35 dBHL at 250Hz ; 15-20 dbHL from 500Hz  - 2000Hz ; 35 dBHL at 4000Hz ; 65dBHL at 6000Hz  and 75 dBHL at 8000Hz .   Left ear:    Thresholds of 35-40 dbHL from 250Hz  - 750Hz ; 50-55 dbHL from 1000hz  - 2000hz ; 65 dBHL at 4000Hz  and 75 dBHL at 8000Hz . Reliability is good Speech reception levels (repeating words near threshold) using recorded spondee word lists:  Right ear: 30 dBHL.  Left ear:  60 dBHL Word recognition (at comfortably loud volumes) using recorded NU-6 word lists, in quiet.  Right ear: 58% at 70 dBHL.  Left ear:   42% at 95 dBHL with 80dBHL speech noise contralateral masking. Tympanometry shows normal middle ear volume and pressure with shallow mobility  (Type As) with absent ipsilateral acoustic reflexes.    CONCLUSION:     Brenda [name] has asymmetrical hearing loss, poorer on the left side. For this reason further evaluation by an ENT is strongly recommended, such as Dr. Thornell Mule, ENT at  the [facility], since this is where Brenda [name] has an appointment for "adjusting" her [employer]ak hearing aids in a few weeks. Note that during this visit [employer] was contacted for a list of providers to help with Brenda [name] current and relatively new hearing aids. Dr. Thornell Mule' audiologist suggested that the audiogram be completed today at this location to facilitate fitting the hearing aid more quickly.  Brenda [name]'s poorer hearing left ear has a mild to moderate low frequency sloping to a severe high frequency sensorineural hearing loss.  The right ear has a mild low frequency hearing loss improving to within normal limits in the midrange, dropping to a moderately severe high frequency sensorineural hearing loss.  Word recognition in poor in each ear at loud levels on the right and very loud levels on the left (equivalent to shouting at less than 1 foot distance from her ear). This amount of hearing loss adversely affects speech communication at normal conversational speech levels.   The test results were discussed and the audiogram provided to Brenda [name] to take to the hearing aid fitting appointment.   RECOMMENDATIONS: 1.   Refer to ENT because of the asymmetrical hearing loss, poorer on the left side.  2.   Follow-up with Dr. Vicie Mutters' audiologist at the [facility], to adjust her [employer] hearing aids. 3.   Monitor hearing closely - it is not known whether the hearing loss has changed since the hearing aids were fit last year.  4.  Strategies that help improve hearing include: A) Face the speaker directly. Optimal is having the speakers face well - lit.  Unless amplified, being within 3-6 feet of the speaker will enhance word recognition. B) Avoid having the speaker back-lit as this will minimize the ability to use cues from lip-reading, facial expression and gestures. C)  Word recognition is poorer in background noise. For optimal word recognition, turn off the TV, radio  or noisy fan when engaging in conversation. In a restaurant, try to sit away from noise sources and close to the primary speaker.  D)  Ask for topic clarification from time to time in order to remain in the conversation.  Most people don't mind repeating or clarifying a point when asked.  If needed, explain the difficulty hearing in background noise or hearing loss.   Kendell Sagraves L. Heide Spark Au.D., CCC-A Doctor of Audiology 01/25/2017 cc: Sela Hilding, MD

## 2017-02-02 ENCOUNTER — Telehealth: Payer: Self-pay | Admitting: Cardiology

## 2017-02-02 ENCOUNTER — Ambulatory Visit (INDEPENDENT_AMBULATORY_CARE_PROVIDER_SITE_OTHER): Payer: Medicare Other | Admitting: *Deleted

## 2017-02-02 DIAGNOSIS — I472 Ventricular tachycardia, unspecified: Secondary | ICD-10-CM

## 2017-02-02 NOTE — Telephone Encounter (Signed)
Spoke with pt and reminded pt of remote transmission that is due today. Pt verbalized understanding.   

## 2017-02-03 ENCOUNTER — Encounter: Payer: Self-pay | Admitting: Cardiology

## 2017-02-03 NOTE — Progress Notes (Signed)
Remote ICD transmission.   

## 2017-02-10 LAB — CUP PACEART REMOTE DEVICE CHECK
Battery Remaining Longevity: 48 mo
Battery Remaining Percentage: 58 %
Brady Statistic RA Percent Paced: 85 %
Brady Statistic RV Percent Paced: 0 %
Date Time Interrogation Session: 20181108182300
HighPow Impedance: 45 Ohm
Implantable Lead Implant Date: 20111110
Implantable Lead Implant Date: 20111110
Implantable Lead Location: 753859
Implantable Lead Location: 753860
Implantable Lead Model: 157
Implantable Lead Model: 4135
Implantable Lead Serial Number: 28803817
Implantable Lead Serial Number: 302861
Implantable Pulse Generator Implant Date: 20111110
Lead Channel Impedance Value: 448 Ohm
Lead Channel Impedance Value: 671 Ohm
Lead Channel Pacing Threshold Amplitude: 0.4 V
Lead Channel Pacing Threshold Amplitude: 0.8 V
Lead Channel Pacing Threshold Pulse Width: 0.4 ms
Lead Channel Pacing Threshold Pulse Width: 0.4 ms
Lead Channel Setting Pacing Amplitude: 2 V
Lead Channel Setting Pacing Amplitude: 2.4 V
Lead Channel Setting Pacing Pulse Width: 0.4 ms
Lead Channel Setting Sensing Sensitivity: 0.6 mV
Pulse Gen Serial Number: 169112

## 2017-02-22 ENCOUNTER — Telehealth: Payer: Self-pay | Admitting: *Deleted

## 2017-02-22 ENCOUNTER — Ambulatory Visit (INDEPENDENT_AMBULATORY_CARE_PROVIDER_SITE_OTHER): Payer: Medicare Other | Admitting: Physician Assistant

## 2017-02-22 ENCOUNTER — Encounter: Payer: Self-pay | Admitting: Physician Assistant

## 2017-02-22 VITALS — BP 130/60 | HR 68 | Ht 61.0 in | Wt 133.4 lb

## 2017-02-22 DIAGNOSIS — R1310 Dysphagia, unspecified: Secondary | ICD-10-CM

## 2017-02-22 DIAGNOSIS — K219 Gastro-esophageal reflux disease without esophagitis: Secondary | ICD-10-CM | POA: Diagnosis not present

## 2017-02-22 DIAGNOSIS — R1319 Other dysphagia: Secondary | ICD-10-CM

## 2017-02-22 DIAGNOSIS — Z7901 Long term (current) use of anticoagulants: Secondary | ICD-10-CM | POA: Diagnosis not present

## 2017-02-22 MED ORDER — OMEPRAZOLE 40 MG PO CPDR: 40.0000 mg | DELAYED_RELEASE_CAPSULE | Freq: Two times a day (BID) | ORAL | 3 refills | Status: DC

## 2017-02-22 NOTE — Telephone Encounter (Signed)
Patient with diagnosis of Atrial fibrillaiton on xarelto for anticoagulation.    Procedure: endoscopy Date of procedure: 02-27-2017  "Per DR Curt Bears clinic note from 11/03/2016: "She has a history of CVA, diabetes, and hypertension."  CHADS2-VASc score of  7 (HTN, AGE > 75, DM2, stroke/tia x 2, female)  CrCl = 25 ml/min (with Ideal body wt)  Per office protocol, patient can hold Xarelto for 1 day prior to procedure.   Patient will not need bridging with Lovenox (enoxaparin) around procedure.  She should restart Xarelto on the evening of procedure or day after, at discretion of procedure MD.   Harrington Challenger PharmD, BCPS, [place] [address] [place] [address] 02/22/2017 4:19 PM

## 2017-02-22 NOTE — Patient Instructions (Addendum)
We have sent the following medications to your pharmacy for you to pick up at your convenience: [place] and [place].  1. Omeprazole 40 mg.   You have been scheduled for an endoscopy. Please follow written instructions given to you at your visit today. If you use inhalers (even only as needed), please bring them with you on the day of your procedure.  We will call you with the Xarelto directions.

## 2017-02-22 NOTE — Telephone Encounter (Signed)
Anticoagulation letter sent.

## 2017-02-22 NOTE — Telephone Encounter (Signed)
  02/22/2017   RE: Brenda [name] DOB: [birth year] MRN: [id number]   Dear Dr. Shonna Chock,    We have scheduled the above patient for an endoscopic procedure. Our records show that she is on anticoagulation therapy.   Please advise as to how long the patient may come off her therapy of Xarelto prior to the procedure, which is scheduled for 02-27-2017.    Please route the Xarelto instructions to [facility] as soon as possible.   Sincerely,    Amy Esterwood PA-C

## 2017-02-22 NOTE — Telephone Encounter (Signed)
Pt takes Xarelto for afib with CHADS2VASc score of 7 (HTN, age - 28, sex, DM, CVA). SCr is elevated at 1.5 with CrCl of only 82mL/min. Will route to Dr Curt Bears for guidance on holding Xarelto for 1 day (due to hx of stroke to minimize time off of anticoagulation) or 2 days (due to renal dysfunction and slower clearance of Xarelto).

## 2017-02-22 NOTE — Progress Notes (Signed)
Agree with assessment and plan as outlined.  

## 2017-02-22 NOTE — Telephone Encounter (Signed)
OK to hold Xarelto for 2 days prior to procedure.

## 2017-02-22 NOTE — Progress Notes (Signed)
Subjective:    Patient ID: Brenda [name], female    DOB: [birth year], 83 y.o.   MRN: [id number]  HPI Brenda [name] is a pleasant 83 year old white female, known to Dr. Havery Moros who comes in today with recurrent complaints of dysphagia and increased reflux. Patient has history of hypertension, atrial fibrillation, adult-onset diabetes mellitus, she is maintained on Xarelto for atrial fibrillation. She has a defibrillator in place, done elsewhere due to history of V. tach. Echo in 2017 showed EF of 60% with moderate tricuspid regurg and severe left atrial enlargement. She is currently followed by Dr. Curt Bears. Patient was last seen here in February 2018 when she underwent EGD with Dr. Havery Moros for complaints of dysphagia. She was found to have a 5 cm hiatal hernia and a short stricture at 31 cm, this was balloon dilated from 16-18 mm and also biopsied. This showed benign esophageal mucosa. She has been maintained on omeprazole 40 mg every morning. Patient states that she felt much better after the esophageal dilation and that her symptoms improved immensely until about a month ago when she started having recurrence of dysphagia, burping and sour brash. She's having trouble on a daily basis with solid food with some episodes of regurgitation. She is also noticed some increase in indigestion. She says her symptoms are very similar to what she had previously though not as bad.  Review of Systems Pertinent positive and negative review of systems were noted in the above HPI section.  All other review of systems was otherwise negative.  Outpatient Encounter Medications as of 02/22/2017  Medication Sig  . aspirin EC 81 MG tablet Take 1 tablet (81 mg total) by mouth daily. Take only with food.  Marland Kitchen atorvastatin (LIPITOR) 40 MG tablet Take 1 tablet (40 mg total) by mouth daily.  . Cholecalciferol (VITAMIN D3) 5000 units CAPS Take 5,000 Units by mouth daily.  . cyanocobalamin (,VITAMIN B-12,) 1000 MCG/ML  injection Inject 1 mL (1,000 mcg total) into the skin once a week.  . Insulin Syringe-Needle U-100 26G X 1/2" 1 ML MISC 1 Units by Does not apply route 4 (four) times daily as needed.  . Multiple Vitamins-Minerals (ICAPS AREDS 2 PO) Take 1 capsule by mouth 2 (two) times daily.   Marland Kitchen omeprazole (PRILOSEC) 40 MG capsule Take 1 capsule (40 mg total) by mouth daily.  Marland Kitchen oxybutynin (DITROPAN-XL) 5 MG 24 hr tablet Take 1 tablet (5 mg total) by mouth at bedtime.  . Rivaroxaban (XARELTO) 15 MG TABS tablet Take 1 tablet (15 mg total) by mouth daily with supper.  . [DISCONTINUED] denosumab (PROLIA) 60 MG/ML SOLN injection Inject 60 mg into the skin every 6 (six) months. Administer in upper arm, thigh, or abdomen  . [DISCONTINUED] lisinopril (PRINIVIL,ZESTRIL) 10 MG tablet Take 1 tablet (10 mg total) by mouth daily.  . [DISCONTINUED] loperamide (IMODIUM A-D) 2 MG tablet Take 2 mg by mouth 4 (four) times daily as needed for diarrhea or loose stools.  . metoprolol tartrate (LOPRESSOR) 100 MG tablet Take 1 tablet (100 mg total) by mouth 2 (two) times daily.  Marland Kitchen omeprazole (PRILOSEC) 40 MG capsule Take 1 capsule (40 mg total) by mouth 2 (two) times daily.   Facility-Administered Encounter Medications as of 02/22/2017  Medication  . 0.9 %  sodium chloride infusion   Allergies  Allergen Reactions  . Erythromycin Nausea Only  . Sotalol Other (See Comments)    Seizures   . Augmentin [Amoxicillin-Pot Clavulanate] Rash  . Crestor [Rosuvastatin Calcium] Rash  .  Keflex [Cephalexin] Rash  . Septra [Sulfamethoxazole-Trimethoprim] Rash   Patient Active Problem List   Diagnosis Date Noted  . Osteoporosis 11/24/2016  . Skin cancer of nose 11/23/2016  . 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 UTI (urinary tract infection) 10/26/2016  . Paroxysmal A-fib ([place]) 08/05/2016  . AKI (acute kidney injury) ([place]) 05/25/2016  . Essential hypertension 05/05/2016  . Type 2 diabetes mellitus without complication, without long-term  current use of insulin ([place]) 05/05/2016  . Hiatal hernia 05/05/2016  . History of CVA (cerebrovascular accident) without residual deficits 05/05/2016  . Memory change 05/05/2016  . AICD (automatic cardioverter/defibrillator) present 05/05/2016  . Back pain 05/05/2016   Social History   Socioeconomic History  . Marital status: Married    Spouse name: Not on file  . Number of children: 2  . Years of education: Not on file  . Highest education level: Not on file  Social Needs  . Financial resource strain: Not on file  . Food insecurity - worry: Not on file  . Food insecurity - inability: Not on file  . Transportation needs - medical: Not on file  . Transportation needs - non-medical: Not on file  Occupational History  . Occupation: retired  Tobacco Use  . Smoking status: Never Smoker  . Smokeless tobacco: Never Used  Substance and Sexual Activity  . Alcohol use: No  . Drug use: No  . Sexual activity: Not on file  Other Topics Concern  . Not on file  Social History Narrative  . Not on file    Ms. Lichtenberger family history includes Breast cancer in her sister; Diabetes in her mother, sister, and son; Heart failure in her father and sister.      Objective:    Vitals:   02/22/17 1435  BP: 130/60  Pulse: 68    Physical Exam well-developed elderly white female in no acute distress, accompanied by her daughter, pleasant blood pressure 130/60 pulse 68, height 5 foot 1, weight 133, BMI 25.2. HEENT; nontraumatic, cephalic EOMI PERRLA sclera anicteric, Cardiovascular; regular rate and rhythm with S1-S2, she has defibrillator in the left chest wall, Pulmonary; clear bilaterally, She is kyphotic, Abdomen; soft nontender nondistended bowel sounds are active no palpable mass or hepatosplenomegaly, Rectal ;exam not done, Ext; no clubbing cyanosis or edema skin warm and dry she ambulates with a walker, Neuropsych; mood and affect appropriate       Assessment & Plan:   #24  83 year old white female with history of distal esophageal stricture status post EGD and balloon dilation February 2018 with significant improvement in symptoms. Patient has had recurrence of solid food dysphagia, belching and sour brash as well as increased indigestion over the past month. Symptoms are similar to what she had prior to her last dilation.  #2 chronic anticoagulation-on Xarelto #3 history of atrial fibrillation #4 status post defibrillator with prior history of V. tach #5 adult-onset diabetes mellitus #6 hypertension   Plan; Continue omeprazole but increase to 40 mg by mouth twice daily before meals breakfast and before meals dinner. Tighten  anti-reflux regimen, discussed with patient Patient will be scheduled for EGD with probable repeat esophageal dilation with Dr. Havery Moros on Monday, 02/27/2017. Procedure was discussed in detail with the patient including indications risks and benefits and she is agreeable to proceed. Patient will need to hold Xarelto for 3 days prior to procedure last GFR around 31. We will communicate with her cardiologist Dr. Curt Bears to assure this is reasonable for this patient.  Kaprice Kage S Muaz Shorey  PA-C 02/22/2017   Cc: Sela Hilding, MD

## 2017-02-22 NOTE — Telephone Encounter (Signed)
  02/22/2017   RE: Brenda [name] DOB: [birth year] MRN: [id number]   Dear Dr. Shonna Chock,    We have scheduled the above patient for an endoscopic procedure. Our records show that she is on anticoagulation therapy.   Please advise as to how long the patient may come off her therapy of xarelto prior to the procedure, which is scheduled for 02-27-2017.  Please route the Xarelto clearance instructions to [facility] CMA. We are suggesting she be off the Xarelto 3 days prior to the procedure date.   Sincerely,    Beatrix Fetters

## 2017-02-23 NOTE — Telephone Encounter (Signed)
Advised the daughter that her mother can take the Xarelto tomorrow 11-30. She is to hold it 12-1, 2, and 3rd ( day of procedure).  I advised her Dr. Havery Moros will tell her when to start the Xarelto after the procedure.

## 2017-02-27 ENCOUNTER — Other Ambulatory Visit: Payer: Self-pay

## 2017-02-27 ENCOUNTER — Encounter: Payer: Self-pay | Admitting: Gastroenterology

## 2017-02-27 ENCOUNTER — Ambulatory Visit (AMBULATORY_SURGERY_CENTER): Payer: Medicare Other | Admitting: Gastroenterology

## 2017-02-27 VITALS — BP 112/88 | HR 89 | Temp 98.2°F | Resp 10 | Ht 61.0 in | Wt 133.0 lb

## 2017-02-27 DIAGNOSIS — K222 Esophageal obstruction: Secondary | ICD-10-CM | POA: Diagnosis not present

## 2017-02-27 DIAGNOSIS — R1310 Dysphagia, unspecified: Secondary | ICD-10-CM | POA: Diagnosis not present

## 2017-02-27 DIAGNOSIS — I2510 Atherosclerotic heart disease of native coronary artery without angina pectoris: Secondary | ICD-10-CM | POA: Diagnosis not present

## 2017-02-27 MED ORDER — SODIUM CHLORIDE 0.9 % IV SOLN: 500.0000 mL | INTRAVENOUS | Status: DC

## 2017-02-27 NOTE — Patient Instructions (Signed)
Impression/Recommendations:  Hiatal hernia handout given to patient. Dilation diet handout given to patient.  Resume previous diet. Continue present medications.  Resume Xarelto tomorrow.  YOU HAD AN ENDOSCOPIC PROCEDURE TODAY AT [facility]:   Refer to the procedure report that was given to you for any specific questions about what was found during the examination.  If the procedure report does not answer your questions, please call your gastroenterologist to clarify.  If you requested that your care partner not be given the details of your procedure findings, then the procedure report has been included in a sealed envelope for you to review at your convenience later.  YOU SHOULD EXPECT: Some feelings of bloating in the abdomen. Passage of more gas than usual.  Walking can help get rid of the air that was put into your GI tract during the procedure and reduce the bloating. If you had a lower endoscopy (such as a colonoscopy or flexible sigmoidoscopy) you may notice spotting of blood in your stool or on the toilet paper. If you underwent a bowel prep for your procedure, you may not have a normal bowel movement for a few days.  Please Note:  You might notice some irritation and congestion in your nose or some drainage.  This is from the oxygen used during your procedure.  There is no need for concern and it should clear up in a day or so.  SYMPTOMS TO REPORT IMMEDIATELY:  Following upper endoscopy (EGD)  Vomiting of blood or coffee ground material  New chest pain or pain under the shoulder blades  Painful or persistently difficult swallowing  New shortness of breath  Fever of 100F or higher  Black, tarry-looking stools  For urgent or emergent issues, a gastroenterologist can be reached at any hour by calling [phone].   DIET:  We do recommend a small meal at first, but then you may proceed to your regular diet.  Drink plenty of fluids but you should avoid alcoholic  beverages for 24 hours.  ACTIVITY:  You should plan to take it easy for the rest of today and you should NOT DRIVE or use heavy machinery until tomorrow (because of the sedation medicines used during the test).    FOLLOW UP: Our staff will call the number listed on your records the next business day following your procedure to check on you and address any questions or concerns that you may have regarding the information given to you following your procedure. If we do not reach you, we will leave a message.  However, if you are feeling well and you are not experiencing any problems, there is no need to return our call.  We will assume that you have returned to your regular daily activities without incident.  If any biopsies were taken you will be contacted by phone or by letter within the next 1-3 weeks.  Please call us at [phone] if you have not heard about the biopsies in 3 weeks.    SIGNATURES/CONFIDENTIALITY: You and/or your care partner have signed paperwork which will be entered into your electronic medical record.  These signatures attest to the fact that that the information above on your After Visit Summary has been reviewed and is understood.  Full responsibility of the confidentiality of this discharge information lies with you and/or your care-partner.

## 2017-02-27 NOTE — Op Note (Signed)
[facility] Patient Name: Brenda [name] Procedure Date: 02/27/2017 7:42 AM MRN: [id number] Endoscopist: Remo Lipps P. Aliceson Dolbow MD, MD Age: 83 Referring MD:  Date of Birth: 09-May-1931 Gender: Female Account #: [id number] Procedure:                Upper GI endoscopy Indications:              Dysphagia Medicines:                Monitored Anesthesia Care Procedure:                Pre-Anesthesia Assessment:                           - Prior to the procedure, a History and Physical                            was performed, and patient medications and                            allergies were reviewed. The patient's tolerance of                            previous anesthesia was also reviewed. The risks                            and benefits of the procedure and the sedation                            options and risks were discussed with the patient.                            All questions were answered, and informed consent                            was obtained. Prior Anticoagulants: The patient has                            taken Xarelto (rivaroxaban), last dose was 3 days                            prior to procedure. ASA Grade Assessment: III - A                            patient with severe systemic disease. After                            reviewing the risks and benefits, the patient was                            deemed in satisfactory condition to undergo the                            procedure.  After obtaining informed consent, the endoscope was                            passed under direct vision. Throughout the                            procedure, the patient's blood pressure, pulse, and                            oxygen saturations were monitored continuously. The                            Model GIF-[id number]) scope was introduced                            through the mouth, and advanced to the second part                of duodenum. The upper GI endoscopy was                            accomplished without difficulty. The patient                            tolerated the procedure well. Scope In: Scope Out: Findings:                 Esophagogastric landmarks were identified: the                            Z-line was found at 27 cm, the gastroesophageal                            junction was found at 27 cm and the upper extent of                            the gastric folds was found at 32 cm from the                            incisors.                           A 5 cm hiatal hernia was present.                           One mild benign-appearing, intrinsic stenosis was                            found 27 cm from the incisors. This measured less                            than one cm (in length). A TTS dilator was passed                            through the scope. Dilation with an 18-19-20 mm  balloon dilator was performed to 20 mm, with only a                            very small superficial mucosal wrents at the                            underside of the stricture within the hernia sac.                           The exam of the esophagus was otherwise normal.                           The entire examined stomach was normal.                           A diverticulum was found in the second portion of                            the duodenum.                           The exam of the duodenum was otherwise normal. Complications:            No immediate complications. Estimated blood loss:                            Minimal. Estimated Blood Loss:     Estimated blood loss was minimal. Impression:               - Esophagogastric landmarks identified.                           - 5 cm hiatal hernia.                           - Benign-appearing esophageal stenosis. Dilated to                            86mm.                           - Normal stomach.                            - Duodenal diverticulum. Recommendation:           - Patient has a contact number available for                            emergencies. The signs and symptoms of potential                            delayed complications were discussed with the                            patient. Return to normal activities tomorrow.  Written discharge instructions were provided to the                            patient.                           - Resume previous diet.                           - Continue present medications.                           - Resume Xarelto tomorrow                           - Follow up as needed for recurrent symptoms.                            Dysphagia likely due to combination of mild                            stricture and hernia. Patient has benefitted from                            dilation in the past, will await response to                            dilation. Remo Lipps P. Marylee Belzer MD, MD 02/27/2017 7:59:16 AM This report has been signed electronically.

## 2017-02-27 NOTE — Progress Notes (Signed)
Dental advisory given to patient 

## 2017-02-27 NOTE — Progress Notes (Signed)
Pt's states no medical or surgical changes since previsit or office visit. 

## 2017-02-27 NOTE — Telephone Encounter (Signed)
Patient is already cleared by Dr. Curt Bears "OK to hold Xarelto for 2 days prior to procedure".  Procedure is today and patient is already instructed to hold Xarelto. Will close this encounter.

## 2017-02-27 NOTE — Progress Notes (Signed)
A/ox3 pleased with MAC, report to Jane RN 

## 2017-02-27 NOTE — Progress Notes (Signed)
Called to room to assist during endoscopic procedure.  Patient ID and intended procedure confirmed with present staff. Received instructions for my participation in the procedure from the performing physician.  

## 2017-02-28 ENCOUNTER — Telehealth: Payer: Self-pay

## 2017-02-28 NOTE — Telephone Encounter (Signed)
  Follow up Call-  Call back number 02/27/2017 05/23/2016  Post procedure Call Back phone  # [phone]  Permission to leave phone message Yes Yes     Patient questions:  Do you have a fever, pain , or abdominal swelling? No. Pain Score  0 *  Have you tolerated food without any problems? Yes.    Have you been able to return to your normal activities? Yes.    Do you have any questions about your discharge instructions: Diet   No. Medications  No. Follow up visit  No.  Do you have questions or concerns about your Care? No.  Actions: * If pain score is 4 or above: No action needed, pain <4.

## 2017-03-22 ENCOUNTER — Ambulatory Visit: Payer: Medicare Other | Admitting: Audiology

## 2017-04-18 ENCOUNTER — Ambulatory Visit (INDEPENDENT_AMBULATORY_CARE_PROVIDER_SITE_OTHER): Payer: Medicare Other | Admitting: Family Medicine

## 2017-04-18 ENCOUNTER — Other Ambulatory Visit: Payer: Self-pay

## 2017-04-18 ENCOUNTER — Encounter: Payer: Self-pay | Admitting: Family Medicine

## 2017-04-18 VITALS — BP 132/72 | HR 70 | Temp 95.6°F | Ht 61.0 in | Wt 136.2 lb

## 2017-04-18 DIAGNOSIS — R350 Frequency of micturition: Secondary | ICD-10-CM

## 2017-04-18 DIAGNOSIS — N3281 Overactive bladder: Secondary | ICD-10-CM

## 2017-04-18 DIAGNOSIS — R413 Other amnesia: Secondary | ICD-10-CM

## 2017-04-18 LAB — POCT URINALYSIS DIP (MANUAL ENTRY)
Bilirubin, UA: NEGATIVE
Blood, UA: NEGATIVE
Glucose, UA: NEGATIVE mg/dL
Ketones, POC UA: NEGATIVE mg/dL
Nitrite, UA: NEGATIVE
Protein Ur, POC: NEGATIVE mg/dL
Spec Grav, UA: 1.01 (ref 1.010–1.025)
Urobilinogen, UA: 0.2 E.U./dL
pH, UA: 6.5 (ref 5.0–8.0)

## 2017-04-18 MED ORDER — CYANOCOBALAMIN 1000 MCG/ML IJ SOLN: 1000.0000 ug | INTRAMUSCULAR | 0 refills | Status: DC

## 2017-04-18 MED ORDER — OXYBUTYNIN CHLORIDE ER 5 MG PO TB24: 5.0000 mg | ORAL_TABLET | Freq: Every day | ORAL | 2 refills | Status: DC

## 2017-04-18 NOTE — Progress Notes (Deleted)
   CC: ***  HPI Tried oxybutynin in the past. Helped some. Has been out of this for 2 months. Worse since this. No fevers. Doesn't think its a UTI.   Needs medicare annual wellness visit.   ROS: ***   CC, SH/smoking status, and VS noted  Objective: BP 132/72   Pulse 70   Temp (!) 95.6 F (35.3 C) (Axillary)   Ht 5\' 1"  (1.549 m)   Wt 136 lb 3.2 oz (61.8 kg)   SpO2 99%   BMI 25.73 kg/m  Gen: NAD, alert, cooperative, and pleasant.*** HEENT: NCAT, EOMI, PERRL CV: RRR, no murmur Resp: CTAB, no wheezes, non-labored Abd: SNTND, BS present, no guarding or organomegaly Ext: No edema, warm Neuro: Alert and oriented, Speech clear, No gross deficits  Assessment and plan:  No problem-specific Assessment & Plan notes found for this encounter.   Orders Placed This Encounter  Procedures  . POCT urinalysis dipstick    Meds ordered this encounter  Medications  . cyanocobalamin (,VITAMIN B-12,) 1000 MCG/ML injection    Sig: Inject 1 mL (1,000 mcg total) into the skin once a week.    Dispense:  12 mL    Refill:  0    Health Maintenance reviewed - {health maintenance:315237}.  Ralene Ok, MD, PGY2 04/18/2017 3:11 PM

## 2017-04-18 NOTE — Progress Notes (Signed)
   CC: urinary frequency   HPI Patient with history of detrusor instability.  Predominant bothersome symptom is nighttime urinary frequency.  Started on ditropan this fall, which helped. She isn't sure why she didn't receive ordered refills from mail order pharmacy.  Symptoms recurred last month, when she ran out of this medicine.  Denies suprapubic pressure, burning with urination, fever.  Additionally, patient seems confused about her medications and doses.  Thought that she was supposed to be taking lisinopril, which we stopped in the past.  Daughter is present and notes that the patient manages her own medications via bottles, not a pillbox.  Daughter has attempted to help in the past.   ROS: Denies chest pain, shortness of breath, abdominal pain, change in bowel movement.  Endorses vague fatigue.  CC, SH/smoking status, and VS noted  Objective: BP 132/72   Pulse 70   Temp (!) 95.6 F (35.3 C) (Axillary)   Ht 5\' 1"  (1.549 m)   Wt 136 lb 3.2 oz (61.8 kg)   SpO2 99%   BMI 25.73 kg/m  Gen: NAD, alert, cooperative, and pleasant elderly female with significant kyphosis, using walker. HEENT: NCAT, EOMI, PERRL CV: RRR, no murmur Resp: CTAB, no wheezes, non-labored Abd: SNTND, BS present, no guarding or organomegaly Ext: No edema, warm Neuro: Alert and oriented, Speech clear, No gross deficits  Assessment and plan:  Detrusor instability Urine without signs of UTI. Restart ditropan, titrate as needed. Return if not improving.   Memory change Referred to pharmacy clinic for med review and pill box. Concerned that patient isnt able to monitor her own medications. Likely needs memory testing at next visit.    Orders Placed This Encounter  Procedures  . POCT urinalysis dipstick    Meds ordered this encounter  Medications  . cyanocobalamin (,VITAMIN B-12,) 1000 MCG/ML injection    Sig: Inject 1 mL (1,000 mcg total) into the skin once a week.    Dispense:  12 mL    Refill:  0    . oxybutynin (DITROPAN-XL) 5 MG 24 hr tablet    Sig: Take 1 tablet (5 mg total) by mouth at bedtime.    Dispense:  30 tablet    Refill:  2     Ralene Ok, MD, PGY2 04/20/2017 7:52 PM

## 2017-04-18 NOTE — Patient Instructions (Signed)
It was a pleasure to see you today! Thank you for choosing [facility] for your primary care. Brenda [name] was seen for bladder issues.   Our plans for today were:  I resent your bladder medicine to the local pharmacy. If you don't notice significant improvement in a week, we can increase the dose if you call.  See Dr. Valentina Lucks for medication review this week.   You should return to our clinic to see Dr. Lindell Noe in 6 months for physical.   Best,  Dr. Lindell Noe

## 2017-04-20 ENCOUNTER — Encounter: Payer: Self-pay | Admitting: Pharmacist

## 2017-04-20 ENCOUNTER — Ambulatory Visit (INDEPENDENT_AMBULATORY_CARE_PROVIDER_SITE_OTHER): Payer: Medicare Other | Admitting: Pharmacist

## 2017-04-20 VITALS — BP 142/72 | HR 71

## 2017-04-20 DIAGNOSIS — R413 Other amnesia: Secondary | ICD-10-CM

## 2017-04-20 DIAGNOSIS — I480 Paroxysmal atrial fibrillation: Secondary | ICD-10-CM | POA: Diagnosis not present

## 2017-04-20 DIAGNOSIS — N179 Acute kidney failure, unspecified: Secondary | ICD-10-CM

## 2017-04-20 DIAGNOSIS — M810 Age-related osteoporosis without current pathological fracture: Secondary | ICD-10-CM | POA: Diagnosis not present

## 2017-04-20 DIAGNOSIS — N3281 Overactive bladder: Secondary | ICD-10-CM | POA: Diagnosis not present

## 2017-04-20 NOTE — Assessment & Plan Note (Signed)
Afib/CV risk - currently treated on xarelto (rivaroxaban), at increased risk of bleeding and gastritis with concomitant aspirin. Currently complaining of indigestion/stomach irritation on PRN PPI and Tums. CrCl < 50 ml/min.  -Recommend d/c aspirin given lack of benefit for CV protection at patient's age and increased risk of bleeding with combined with xarelto. Recommend continue on dose-reduced Xarelto.

## 2017-04-20 NOTE — Progress Notes (Signed)
S:     Chief Complaint  Patient presents with  . Medication Management    Adherence - Med Review - Pill Box    Patient arrives ambulating with assistance of a walker AND accompanied by her daughter [name].  Presents for medication review including discussion of all prescription and OTC therapies at the request of Dr. Lindell Noe. Patient was referred by Primary Care Provider, Dr. Lindell Noe on 1/22 (two days ago). Patient brings all medication bottles with her with the exception of aspirin 81 mg.   Family/Social History: Moved from [place] in December, 2017.  Daughter previously managed the patient's medication collaboratively. Now, patient manages her own medications and daughter will give vitamin B 12 injections  Patient reports adherence with "most" medications.  However, days per week of use for several medication were less frequent than prescribed (omeprazole taking PRN for acid reflux, misses every other dose of b12 injections).   Reports strict adherence with rivaroxaban (Xarelto).   Reports STOPPING and no longer taking Lisinopril per physician direction (prescrption was filled last month). She reports she is "supposed to be" on Prolia twice yearly and states her last injection was before December of 2017 when she moved here.  She inquires about potential causes of fatigue as she experiences this frequently. She also complains of frequent indigestion (on omeprazole PRN qHS). She states she used to be on ranitidine. She states she has been on vitamin D3 5000 IU for "years, since [place]". She reports she has been taking oxybutynin XL nightly x3-4 months and describes some urinary hesitancy/pain/burning.   O:  Physical Exam  Constitutional: She appears well-developed and well-nourished.     Review of Systems  Constitutional: Negative.   Psychiatric/Behavioral: Positive for memory loss.  All other systems reviewed and are negative.   Lab Results  Component Value Date   HGBA1C 5.7 05/05/2016   Vitals:   04/20/17 1029  BP: (!) 142/72  Pulse: 71  SpO2: 99%   Last TSH 3.643 on 07/30/2016 Last Hgb 11.1 on 07/30/2016 Last CrCl ~ 27 ml/min using SCr from 07/30/2016 and total body weight  A/P: #Medication adherence - patient self managing her own medications and non adherent to scheduled omeprazole (prescribed BID) and B12 injections, additionally nonadherent to previously prescribed Prolia (denosumab). Daughter is available for assistance but intermittently involved. -Provided patient with pill box and helped her fill the pill box. -Counseled on adherence to NIGHTLY omeprazole 40 mg daily for now. Recommend that symptoms be reassessed before encouraging adherence to BID dosing.   #Medication management - several medication optimization opportunities are identified. Conferred with Dr. Ardelia Mems about the below lab monitoring services. Recommendations for medication change are listed below, to be reviewed by PCP, Dr. Lindell Noe at next visit. -Osteoporosis/Falls risk- currently suboptimally treated, previously on Prolia, taking Tums PRN and vitamin D3 5000 IU daily. Additionally, on PPI. - Recommend evaluation for restart of Prolia 60 mg every 6 months - Draw CMET today - Recommend start of Calcium CITRATE (like Citrical) 600 mg BID as long as calcium is WNL - Draw vitamin D level today, re-evaluate need for continued vitamin D 5000 IU daily. If level results within normal limits, recommend decreasing to 4000 IU daily for maintenance of vitamin D stores  -Complaints of indigestion currently suboptimally treated and poorly controlled on PRN omeprazole and Tums. PPI not optimal given concomitant osteoporosis.   -Scheduled omeprazole 40 mg nightly for now, recommend assessing sx control before recommending adherence to prescribed omeprazole 40 mg BID -  Recommend discontinuing omeprazole and starting ranitidine 75 mg daily with option to increase to BID or increase dose.    -Recommend d/c Tums if she is started on a BID calcium supplement - suggest use of calcium citrate.   -Complaints of fatigue - after discussion with Dr. Ardelia Mems, will draw labs today to assess for causes. Patient euthyroid at last lab draw, no hypotension or bradycardia today. Of note, had low Hgb on last lab draw.   - CBC today  - Detrusor instability - currently suboptimally treated on oxybutynin XL once daily as patient is experiencing anticholinergic symptoms of urinary hesitancy and potential worsening of memory. Beer's criteria alternatives include Vesicare (solifenacin), tolterodine (Detrol), Toviaz (fesoterodine), darifenacin (Enablex), and trospium (Sanctura) or if fails these; Myrbetriq (mirabegron).  - Recommend d/c oxybutynin XL and change to either Detrol LA or Trospium IR as these are preferred on patient's [facility] formulary and do not require prior authorization.  - Afib/CV risk - currently treated on xarelto (rivaroxaban), at increased risk of bleeding and gastritis with concomitant aspirin. Currently complaining of indigestion/stomach irritation on PRN PPI and Tums. CrCl < 50 ml/min.  -Recommend d/c aspirin given lack of benefit for CV protection at patient's age and increased risk of bleeding with combined with xarelto. Recommend continue on dose-reduced Xarelto.  Written patient instructions provided.  Total time in face to face counseling 30 minutes.   Follow up in with Dr. Lindell Noe in 3-4 weeks.   Patient seen with Onnie Boer, PharmD Candidate and Deirdre Pippins, PGY2 Pharmacy Resident, PharmD, BCPS.

## 2017-04-20 NOTE — Assessment & Plan Note (Signed)
Detrusor instability - currently suboptimally treated on oxybutynin XL once daily as patient is experiencing anticholinergic symptoms of urinary hesitancy and potential worsening of memory. Beer's criteria alternatives include Vesicare (solifenacin), tolterodine (Detrol), Toviaz (fesoterodine), darifenacin (Enablex), and trospium (Sanctura) or if fails these; Myrbetriq (mirabegron).  - Recommend d/c oxybutynin XL and change to either Detrol LA or Trospium IR as these are preferred on patient's [facility] formulary and do not require prior authorization.

## 2017-04-20 NOTE — Assessment & Plan Note (Signed)
#  Medication adherence - patient self managing her own medications and non adherent to scheduled omeprazole (prescribed BID) and B12 injections, additionally nonadherent to previously prescribed Prolia (denosumab). Daughter is available for assistance but intermittently involved. -Provided patient with pill box and helped her fill the pill box. -Counseled on adherence to NIGHTLY omeprazole 40 mg daily for now. Recommend that symptoms be reassessed before encouraging adherence to BID dosing.   #Medication management - several medication optimization opportunities are identified. Conferred with Dr. Ardelia Mems about the below lab monitoring services. Recommendations for medication change are listed below, to be reviewed by PCP, Dr. Lindell Noe at next visit.

## 2017-04-20 NOTE — Patient Instructions (Addendum)
Thanks for coming to see me today! We are going to make some suggestions about your medicine to Dr. Lindell Noe and we will also get some blood work today. No changes made to your medications today.   Please come back to Dr. Lindell Noe in 3-4 weeks so you can talk about the medicine changes.

## 2017-04-20 NOTE — Assessment & Plan Note (Signed)
Referred to pharmacy clinic for med review and pill box. Concerned that patient isnt able to monitor her own medications. Likely needs memory testing at next visit.

## 2017-04-20 NOTE — Assessment & Plan Note (Signed)
Urine without signs of UTI. Restart ditropan, titrate as needed. Return if not improving.

## 2017-04-20 NOTE — Assessment & Plan Note (Signed)
-  Osteoporosis/Falls risk- currently suboptimally treated, previously on Prolia, taking Tums PRN and vitamin D3 5000 IU daily. Additionally, on PPI. - Recommend evaluation for restart of Prolia 60 mg every 6 months - Draw CMET today - Recommend start of Calcium CITRATE (like Citrical) 600 mg BID as long as calcium is WNL - Draw vitamin D level today, re-evaluate need for continued vitamin D 5000 IU daily. If level results within normal limits, recommend decreasing to 4000 IU daily for maintenance of vitamin D stores

## 2017-04-21 LAB — CBC
Hematocrit: 24.8 % — ABNORMAL LOW (ref 34.0–46.6)
Hemoglobin: 7.9 g/dL — ABNORMAL LOW (ref 11.1–15.9)
MCH: 28.4 pg (ref 26.6–33.0)
MCHC: 31.9 g/dL (ref 31.5–35.7)
MCV: 89 fL (ref 79–97)
Platelets: 293 10*3/uL (ref 150–379)
RBC: 2.78 x10E6/uL — ABNORMAL LOW (ref 3.77–5.28)
RDW: 15 % (ref 12.3–15.4)
WBC: 9.3 10*3/uL (ref 3.4–10.8)

## 2017-04-21 LAB — BASIC METABOLIC PANEL
BUN/Creatinine Ratio: 22 (ref 12–28)
BUN: 30 mg/dL — ABNORMAL HIGH (ref 8–27)
CO2: 19 mmol/L — ABNORMAL LOW (ref 20–29)
Calcium: 10.8 mg/dL — ABNORMAL HIGH (ref 8.7–10.3)
Chloride: 101 mmol/L (ref 96–106)
Creatinine, Ser: 1.38 mg/dL — ABNORMAL HIGH (ref 0.57–1.00)
GFR calc Af Amer: 41 mL/min/{1.73_m2} — ABNORMAL LOW (ref >59)
GFR calc non Af Amer: 35 mL/min/{1.73_m2} — ABNORMAL LOW (ref >59)
Glucose: 151 mg/dL — ABNORMAL HIGH (ref 65–99)
Potassium: 4.7 mmol/L (ref 3.5–5.2)
Sodium: 137 mmol/L (ref 134–144)

## 2017-04-21 LAB — COMPREHENSIVE METABOLIC PANEL
ALT: 12 IU/L (ref 0–32)
AST: 14 IU/L (ref 0–40)
Albumin/Globulin Ratio: 1.2 (ref 1.2–2.2)
Albumin: 4 g/dL (ref 3.5–4.7)
Alkaline Phosphatase: 113 IU/L (ref 39–117)
BUN/Creatinine Ratio: 18 (ref 12–28)
BUN: 29 mg/dL — ABNORMAL HIGH (ref 8–27)
Bilirubin Total: <0.2 mg/dL (ref 0.0–1.2)
CO2: 20 mmol/L (ref 20–29)
Calcium: 10.6 mg/dL — ABNORMAL HIGH (ref 8.7–10.3)
Chloride: 102 mmol/L (ref 96–106)
Creatinine, Ser: 1.58 mg/dL — ABNORMAL HIGH (ref 0.57–1.00)
GFR calc Af Amer: 35 mL/min/{1.73_m2} — ABNORMAL LOW (ref >59)
GFR calc non Af Amer: 30 mL/min/{1.73_m2} — ABNORMAL LOW (ref >59)
Globulin, Total: 3.3 g/dL (ref 1.5–4.5)
Glucose: 150 mg/dL — ABNORMAL HIGH (ref 65–99)
Potassium: 4.7 mmol/L (ref 3.5–5.2)
Sodium: 136 mmol/L (ref 134–144)
Total Protein: 7.3 g/dL (ref 6.0–8.5)

## 2017-04-21 LAB — VITAMIN D 25 HYDROXY (VIT D DEFICIENCY, FRACTURES): Vit D, 25-Hydroxy: 59.2 ng/mL (ref 30.0–100.0)

## 2017-04-30 ENCOUNTER — Telehealth: Payer: Self-pay | Admitting: Family Medicine

## 2017-04-30 NOTE — Telephone Encounter (Signed)
-----   Message from Kinnie Feil, MD sent at 04/21/2017  8:47 AM EST -----   ----- Message ----- From: Lavone Neri Lab Results In Sent: 04/21/2017   8:18 AM To: Kinnie Feil, MD

## 2017-04-30 NOTE — Telephone Encounter (Signed)
White team, please call patient and ask her to come in to readjust medicines based on recent labs. She needs to bring all of her medicine bottles with her again. I have 3 slots on Friday afternoon right now it appears. Thanks!

## 2017-05-01 NOTE — Telephone Encounter (Signed)
Contacted pt and gave her the below information and scheduled her an appointment in a Same Day slot for Friday 2/8. Routing to PCP as an Pharmacist, hospital.Katharina Caper, Shaya Reddick D, [place]

## 2017-05-02 NOTE — Progress Notes (Signed)
Electrophysiology Office Note   Date:  05/03/2017   ID:  Brenda [name], DOB [birth year], MRN [id number]  PCP:  Sela Hilding, MD  Primary Electrophysiologist:  Atiana Levier Meredith Leeds, MD    Chief Complaint  Patient presents with  . Defib Check    Ventricular tachycardia/PAF     History of Present Illness: Brenda [name] is a 83 y.o. female who presents today for electrophysiology evaluation.   She has a history of CVA, diabetes, and hypertension. She has a defibrillator in place, with note suggesting that it was due to history of ventricular tachycardia. She had an echo done in November 2017 that showed a preserved ejection fraction of 60%, moderate tricuspid regurgitation, and a severely enlarged left atrium.  According to her family, she was started on sotalol a few years ago. On the sotalol, she had, what her daughter calls seizures. They also describe that she had a flat line on her EKG. She had an ICD placed at that time. Sotalol was thus stopped.   She presented to the emergency room on 07/30/16 after multiple ICD shocks. She says that that day, she felt very fatigued and short of breath. She was not able to move around her house, and had difficulty even taking one step. On presentation to the emergency room, her device was interrogated which showed atrial fibrillation with rapid rates into the 190s.   Today, denies symptoms of palpitations, chest pain, shortness of breath, orthopnea, PND, lower extremity edema, claudication, dizziness, presyncope, syncope, bleeding, or neurologic sequela. The patient is tolerating medications without difficulties.  Her main complaint continues to be fatigue.  She feels well when she is at rest, but does not have much energy when she is up and moving around.  Approximately 1 month ago, she had a sharp chest pain that was in the left side of her chest.  This woke her from sleep.  It lasted approximately 15 minutes.  She did not get out of bed or  exert herself.  This was not associated with shortness of breath.  Past Medical History:  Diagnosis Date  . Anemia   . Arthritis   . Basal cell carcinoma    nose  . Diabetes mellitus without complication ([place])    pt denies diabetes  . GERD (gastroesophageal reflux disease)   . Hiatal hernia   . HLD (hyperlipidemia)   . Hypertension   . Seizure ([place]) 2011   Past Surgical History:  Procedure Laterality Date  . CARDIAC DEFIBRILLATOR PLACEMENT    . CATARACT EXTRACTION Left 02/2001  . CHOLECYSTECTOMY    . LUMBAR LAMINECTOMY  06/11/2001  . LUMBAR LAMINECTOMY  02/09/2006  . LUMBAR LAMINECTOMY  07/07/2006   with spianal cord stimulator  . SPINAL CORD STIMULATOR REMOVAL  14/2009  . TUBAL LIGATION       Current Outpatient Medications  Medication Sig Dispense Refill  . aspirin EC 81 MG tablet Take 1 tablet (81 mg total) by mouth daily. Take only with food. 30 tablet 3  . atorvastatin (LIPITOR) 40 MG tablet Take 1 tablet (40 mg total) by mouth daily. 90 tablet 3  . B Complex-C-E-Zn (B COMPLEX-C-E-ZINC) tablet Take 1 tablet by mouth daily. For Bladder Control    . Cholecalciferol (VITAMIN D3) 5000 units CAPS Take 5,000 Units by mouth daily.    . cyanocobalamin (,VITAMIN B-12,) 1000 MCG/ML injection Inject 1 mL (1,000 mcg total) into the skin once a week. 12 mL 0  . Insulin Syringe-Needle U-100 26G X 1/2" 1  ML MISC 1 Units by Does not apply route 4 (four) times daily as needed. 100 each 11  . Multiple Vitamins-Minerals (ICAPS AREDS 2 PO) Take 1 capsule by mouth 2 (two) times daily.     Marland Kitchen omeprazole (PRILOSEC) 40 MG capsule Take 1 capsule (40 mg total) by mouth daily. 90 capsule 1  . oxybutynin (DITROPAN-XL) 5 MG 24 hr tablet Take 1 tablet (5 mg total) by mouth at bedtime. 30 tablet 2  . Rivaroxaban (XARELTO) 15 MG TABS tablet Take 1 tablet (15 mg total) by mouth daily with supper. 90 tablet 3  . metoprolol tartrate (LOPRESSOR) 100 MG tablet Take 1 tablet (100 mg total) by mouth 2 (two)  times daily. 180 tablet 3   Current Facility-Administered Medications  Medication Dose Route Frequency Provider Last Rate Last Dose  . 0.9 %  sodium chloride infusion  500 mL Intravenous Continuous Armbruster, Carlota Raspberry, MD        Allergies:   Sotalol; Erythromycin; Augmentin [amoxicillin-pot clavulanate]; Crestor [rosuvastatin calcium]; Keflex [cephalexin]; and Septra [sulfamethoxazole-trimethoprim]   Social History:  The patient  reports that  has never smoked. she has never used smokeless tobacco. She reports that she does not drink alcohol or use drugs.   Family History:  The patient's family history includes Breast cancer in her sister; Diabetes in her mother, sister, and son; Heart failure in her father and sister.    ROS:  Please see the history of present illness.   Otherwise, review of systems is positive for fatigue.   All other systems are reviewed and negative.   PHYSICAL EXAM: VS:  BP 130/62   Pulse 70   Ht 5\' 1"  (1.549 m)   Wt 134 lb (60.8 kg)   BMI 25.32 kg/m  , BMI Body mass index is 25.32 kg/m. GEN: Well nourished, well developed, in no acute distress  HEENT: normal  Neck: no JVD, carotid bruits, or masses Cardiac: RRR; no murmurs, rubs, or gallops,no edema  Respiratory:  clear to auscultation bilaterally, normal work of breathing GI: soft, nontender, nondistended, + BS MS: no deformity or atrophy  Skin: warm and dry, device site well healed Neuro:  Strength and sensation are intact Psych: euthymic mood, full affect  EKG:  EKG is ordered today. Personal review of the ekg ordered shows A sense, LVH by voltage, rate 70  Personal review of the device interrogation today. Results in [place]: 07/30/2016: Magnesium 1.9; TSH 3.643 04/20/2017: ALT 12; BUN 29; Creatinine, Ser 1.58; Hemoglobin 7.9; Platelets 293; Potassium 4.7; Sodium 136    Lipid Panel  No results found for: CHOL, TRIG, HDL, CHOLHDL, VLDL, LDLCALC, LDLDIRECT   Wt Readings from Last 3  Encounters:  05/03/17 134 lb (60.8 kg)  04/18/17 136 lb 3.2 oz (61.8 kg)  02/27/17 133 lb (60.3 kg)      Other studies Reviewed: Additional studies/ records that were reviewed today include: PCP notes  TTE 01/28/16 - Outside records reviewed EF 65%, LA moderately enlarged  ASSESSMENT AND PLAN:  1.  Hypertension: Well-controlled today.  No changes.  2.  Inducible ventricular tachycardia: [employer] dual-chamber ICD in place.  Functioning appropriately.  She has had no ICD shocks inappropriately for atrial fibrillation.  Her atrial fibrillation has gone into the 180s with a VT zone at 190.  We have increased her VT zone to 200s today.  Hopefully this Kao Berkheimer avoid inappropriate shocks.  3. Paroxysmal atrial fibrillation: Early on Xarelto.  She has had 3 minutes  of atrial fibrillation with rapid rates since her device check Tracyann Duffell decrease her metoprolol to 25 mg twice a day and start her on diltiazem 360 mg.  This Torrie Namba hopefully help to control her heart rate as well as her fatigue.  This patients CHA2DS2-VASc Score and unadjusted Ischemic Stroke Rate (% per year) is equal to 7.2 % stroke rate/year from a score of 5  Above score calculated as 1 point each if present [CHF, HTN, DM, Vascular=MI/PAD/Aortic Plaque, Age if 65-74, or Female] Above score calculated as 2 points each if present [Age > 75, or Stroke/TIA/TE]  4. Fatigue: Continues to have episodes of significant fatigue.  It is unclear as to the cause, but it could be due to her metoprolol.  We Janyia Guion decrease her metoprolol and start her on diltiazem for further rate control.  5.  Chest pain: Sharp pain in her left chest that lasted for up to 15 minutes.  This woke her from sleep.  This is atypical for cardiac type chest pain.  She has had no further episodes.  She Jahseh Lucchese call us if any further episodes occur.  She may warrant further noninvasive testing.  Current medicines are reviewed at length with the patient today.   The  patient does not have concerns regarding her medicines.  The following changes were made today: Decrease metoprolol, start diltiazem  Labs/ tests ordered today include:  Orders Placed This Encounter  Procedures  . EKG 12-Lead     Disposition:   FU with Annette Bertelson 6 months  Signed, Manish Ruggiero Meredith Leeds, MD  05/03/2017 11:30 AM     [facility] [address] [place] [address] [phone] (office) [phone] (fax)

## 2017-05-03 ENCOUNTER — Encounter: Payer: Self-pay | Admitting: Cardiology

## 2017-05-03 ENCOUNTER — Ambulatory Visit (INDEPENDENT_AMBULATORY_CARE_PROVIDER_SITE_OTHER): Payer: Medicare Other | Admitting: Cardiology

## 2017-05-03 VITALS — BP 130/62 | HR 70 | Ht 61.0 in | Wt 134.0 lb

## 2017-05-03 DIAGNOSIS — I472 Ventricular tachycardia, unspecified: Secondary | ICD-10-CM

## 2017-05-03 DIAGNOSIS — I10 Essential (primary) hypertension: Secondary | ICD-10-CM | POA: Diagnosis not present

## 2017-05-03 DIAGNOSIS — I480 Paroxysmal atrial fibrillation: Secondary | ICD-10-CM | POA: Diagnosis not present

## 2017-05-03 DIAGNOSIS — R079 Chest pain, unspecified: Secondary | ICD-10-CM

## 2017-05-03 MED ORDER — DILTIAZEM HCL ER COATED BEADS 360 MG PO CP24: 360.0000 mg | ORAL_CAPSULE | Freq: Every day | ORAL | 0 refills | Status: DC

## 2017-05-03 MED ORDER — METOPROLOL TARTRATE 25 MG PO TABS: 25.0000 mg | ORAL_TABLET | Freq: Two times a day (BID) | ORAL | 0 refills | Status: DC

## 2017-05-03 MED ORDER — METOPROLOL TARTRATE 25 MG PO TABS: 25.0000 mg | ORAL_TABLET | Freq: Two times a day (BID) | ORAL | 3 refills | Status: DC

## 2017-05-03 MED ORDER — DILTIAZEM HCL ER COATED BEADS 360 MG PO CP24: 360.0000 mg | ORAL_CAPSULE | Freq: Every day | ORAL | 2 refills | Status: DC

## 2017-05-03 MED ORDER — DILTIAZEM HCL ER COATED BEADS 360 MG PO CP24: 360.0000 mg | ORAL_CAPSULE | Freq: Every day | ORAL | 6 refills | Status: DC

## 2017-05-03 NOTE — Addendum Note (Signed)
Addended by: Stanton Kidney on: 05/03/2017 01:53 PM   Modules accepted: Orders

## 2017-05-03 NOTE — Patient Instructions (Signed)
Medication Instructions:  Your physician has recommended you make the following change in your medication:  1. DECREASE Metoprolol Tartrate to 25 mg twice a day 2. START Diltiazem 360 mg daily  * If you need a refill on your cardiac medications before your next appointment, please call your pharmacy. *  Labwork: None ordered  Testing/Procedures: None ordered  Follow-Up: Your physician wants you to follow-up in: 6 months with Dr. Curt Bears.  You will receive a reminder letter in the mail two months in advance. If you don't receive a letter, please call our office to schedule the follow-up appointment.  Thank you for choosing [facility]!!   Trinidad Curet, RN [phone]   Any Other Special Instructions Will Be Listed Below (If Applicable).  Diltiazem extended-release capsules or tablets What is this medicine? DILTIAZEM (dil TYE a zem) is a calcium-channel blocker. It affects the amount of calcium found in your heart and muscle cells. This relaxes your blood vessels, which can reduce the amount of work the heart has to do. This medicine is used to treat high blood pressure and chest pain caused by angina. This medicine may be used for other purposes; ask your health care provider or pharmacist if you have questions. COMMON BRAND NAME(S): Cardizem CD, Cardizem LA, Cardizem SR, Cartia XT, Dilacor XR, Dilt-CD, Diltia XT, Diltzac, Matzim LA, Rema Fendt, Tiamate, Tiazac What should I tell my health care provider before I take this medicine? They need to know if you have any of these conditions: -heart problems, low blood pressure, irregular heartbeat -liver disease -previous heart attack -an unusual or allergic reaction to diltiazem, other medicines, foods, dyes, or preservatives -pregnant or trying to get pregnant -breast-feeding How should I use this medicine? Take this medicine by mouth with a glass of water. Follow the directions on the prescription label. Swallow whole, do not  crush or chew. Ask your doctor or pharmacist if your should take this medicine with food. Take your doses at regular intervals. Do not take your medicine more often then directed. Do not stop taking except on the advice of your doctor or health care professional. Ask your doctor or health care professional how to gradually reduce the dose. Talk to your pediatrician regarding the use of this medicine in children. Special care may be needed. Overdosage: If you think you have taken too much of this medicine contact a poison control center or emergency room at once. NOTE: This medicine is only for you. Do not share this medicine with others. What if I miss a dose? If you miss a dose, take it as soon as you can. If it is almost time for your next dose, take only that dose. Do not take double or extra doses. What may interact with this medicine? Do not take this medicine with any of the following medications: -cisapride -hawthorn -pimozide -ranolazine -red yeast rice This medicine may also interact with the following medications: -buspirone -carbamazepine -cimetidine -cyclosporine -digoxin -local anesthetics or general anesthetics -lovastatin -medicines for anxiety or difficulty sleeping like midazolam and triazolam -medicines for high blood pressure or heart problems -quinidine -rifampin, rifabutin, or rifapentine This list may not describe all possible interactions. Give your health care provider a list of all the medicines, herbs, non-prescription drugs, or dietary supplements you use. Also tell them if you smoke, drink alcohol, or use illegal drugs. Some items may interact with your medicine. What should I watch for while using this medicine? Check your blood pressure and pulse rate regularly. Ask  your doctor or health care professional what your blood pressure and pulse rate should be and when you should contact him or her. You may feel dizzy or lightheaded. Do not drive, use machinery, or  do anything that needs mental alertness until you know how this medicine affects you. To reduce the risk of dizzy or fainting spells, do not sit or stand up quickly, especially if you are an older patient. Alcohol can make you more dizzy or increase flushing and rapid heartbeats. Avoid alcoholic drinks. What side effects may I notice from receiving this medicine? Side effects that you should report to your doctor or health care professional as soon as possible: -allergic reactions like skin rash, itching or hives, swelling of the face, lips, or tongue -confusion, mental depression -feeling faint or lightheaded, falls -redness, blistering, peeling or loosening of the skin, including inside the mouth -slow, irregular heartbeat -swelling of the feet and ankles -unusual bleeding or bruising, pinpoint red spots on the skin Side effects that usually do not require medical attention (report to your doctor or health care professional if they continue or are bothersome): -constipation or diarrhea -difficulty sleeping -facial flushing -headache -nausea, vomiting -sexual dysfunction -weak or tired This list may not describe all possible side effects. Call your doctor for medical advice about side effects. You may report side effects to FDA at 1-800-FDA-1088. Where should I keep my medicine? Keep out of the reach of children. Store at room temperature between 15 and 30 degrees C (59 and 86 degrees F). Protect from humidity. Throw away any unused medicine after the expiration date. NOTE: This sheet is a summary. It may not cover all possible information. If you have questions about this medicine, talk to your doctor, pharmacist, or health care provider.  2018 Elsevier/Gold Standard (2007-07-05 14:35:47)

## 2017-05-04 ENCOUNTER — Ambulatory Visit (INDEPENDENT_AMBULATORY_CARE_PROVIDER_SITE_OTHER): Payer: Medicare Other | Admitting: *Deleted

## 2017-05-04 ENCOUNTER — Telehealth: Payer: Self-pay | Admitting: Cardiology

## 2017-05-04 DIAGNOSIS — I472 Ventricular tachycardia, unspecified: Secondary | ICD-10-CM

## 2017-05-04 NOTE — Telephone Encounter (Signed)
LMOVM reminding pt to send remote transmission.   

## 2017-05-05 ENCOUNTER — Encounter: Payer: Self-pay | Admitting: Family Medicine

## 2017-05-05 ENCOUNTER — Ambulatory Visit (INDEPENDENT_AMBULATORY_CARE_PROVIDER_SITE_OTHER): Payer: Medicare Other | Admitting: Family Medicine

## 2017-05-05 ENCOUNTER — Other Ambulatory Visit: Payer: Self-pay

## 2017-05-05 VITALS — BP 136/64 | HR 71 | Temp 98.0°F | Ht 61.0 in | Wt 137.2 lb

## 2017-05-05 DIAGNOSIS — I10 Essential (primary) hypertension: Secondary | ICD-10-CM | POA: Diagnosis not present

## 2017-05-05 DIAGNOSIS — Z85828 Personal history of other malignant neoplasm of skin: Secondary | ICD-10-CM

## 2017-05-05 DIAGNOSIS — C44301 Unspecified malignant neoplasm of skin of nose: Secondary | ICD-10-CM

## 2017-05-05 DIAGNOSIS — K449 Diaphragmatic hernia without obstruction or gangrene: Secondary | ICD-10-CM

## 2017-05-05 DIAGNOSIS — D649 Anemia, unspecified: Secondary | ICD-10-CM

## 2017-05-05 DIAGNOSIS — M810 Age-related osteoporosis without current pathological fracture: Secondary | ICD-10-CM

## 2017-05-05 DIAGNOSIS — I480 Paroxysmal atrial fibrillation: Secondary | ICD-10-CM | POA: Diagnosis not present

## 2017-05-05 DIAGNOSIS — N3281 Overactive bladder: Secondary | ICD-10-CM

## 2017-05-05 MED ORDER — TOLTERODINE TARTRATE ER 2 MG PO CP24: 2.0000 mg | ORAL_CAPSULE | Freq: Every day | ORAL | 0 refills | Status: DC

## 2017-05-05 NOTE — Patient Instructions (Signed)
It was a pleasure to see you today! Thank you for choosing [facility] for your primary care. Brenda [name] was seen for medication adjustments.   Our plans for today were:  STOP CALCIUM/TUMS  STOP ASPIRIN  Change your oxybutynin to Detrol LA  DECREASE your vitamin D to 1000IU  I will call you if we need to change anything based on the repeated labs.   You should return to our clinic to see Dr. Lindell Noe in 3 months for medication adjustments.   Best,  Dr. Lindell Noe

## 2017-05-05 NOTE — Progress Notes (Signed)
   CC: medication adjustments  HPI Med changes: recently seen with pharmacy clinic, who recommended several changes. Based on recent labs, we will change as below. Husband in clinic, helps with her medicines.   Patient brought all meds. Reviewed pill box. Plan as below.   Urinary frequency - persistent symptoms, no change with oxybutynin. Some dry mouth.   Patient has been taking 5000 IU vitamin D daily.   Takes b12 shots weekly.   Still feels tired and less "get up and go."   ROS: denies CP, SOB, abd pain. Endorses back pain unchanged. Urinary frequency but no pain. no changes in BMs.    CC, SH/smoking status, and VS noted  Objective: BP 136/64   Pulse 71   Temp 98 F (36.7 C) (Oral)   Ht 5\' 1"  (1.549 m)   Wt 137 lb 3.2 oz (62.2 kg)   SpO2 99%   BMI 25.92 kg/m  Gen: NAD, alert, cooperative, and pleasant elderly woman, kyphotic. Uses walker.  HEENT: NCAT, EOMI, PERRL. Vascular appearing 1-69mm lesion on end of nose,near well healed scar.  CV: RRR, no murmur Resp: CTAB, no wheezes, non-labored Ext: No edema, warm Neuro: Alert and oriented, Speech clear, No gross deficits  Assessment and plan:  Essential hypertension Continue dilt and metoprolol per cards. No need for ACEi.    Osteoporosis Stop calcium/tums given elevated level. Recheck in 3 months. Restart prolia.   Paroxysmal A-fib (HCC) Continue xarelto per cards. Stop ASA given bleeding risk and anemia.   Hiatal hernia Continue current daily PPI.   Skin cancer of nose amb ref to derm given hx of mohs surgery in [place]. New vascular appearing lesion on end of nose. Pt reports hx of bCC.  Detrusor instability Change oxybutynin to detrol LA for less anticholinergic effects. This should be covered by insurance.   Anemia ?hx of b12 deficiency. Check anemia panel today. Certainly could be contributing to tired feelings. Denies overt blood loss or melena. Offered FOBT, patient declined.    Orders Placed  This Encounter  Procedures  . Anemia panel  . Ambulatory referral to Dermatology    Referral Priority:   Routine    Referral Type:   Consultation    Referral Reason:   Specialty Services Required    Requested Specialty:   Dermatology    Number of Visits Requested:   1    Meds ordered this encounter  Medications  . tolterodine (DETROL LA) 2 MG 24 hr capsule    Sig: Take 1 capsule (2 mg total) by mouth daily.    Dispense:  90 capsule    Refill:  0   Ralene Ok, MD, PGY2 05/09/2017 9:16 AM

## 2017-05-05 NOTE — Assessment & Plan Note (Addendum)
Stop calcium/tums given elevated level. Recheck in 3 months. Restart prolia.

## 2017-05-05 NOTE — Assessment & Plan Note (Signed)
Continue dilt and metoprolol per cards. No need for ACEi.

## 2017-05-08 NOTE — Progress Notes (Signed)
Remote ICD transmission.   

## 2017-05-09 DIAGNOSIS — D509 Iron deficiency anemia, unspecified: Secondary | ICD-10-CM | POA: Insufficient documentation

## 2017-05-09 DIAGNOSIS — H353132 Nonexudative age-related macular degeneration, bilateral, intermediate dry stage: Secondary | ICD-10-CM | POA: Diagnosis not present

## 2017-05-09 LAB — ANEMIA PANEL
Ferritin: 13 ng/mL — ABNORMAL LOW (ref 15–150)
Folate, Hemolysate: 532.8 ng/mL
Folate, RBC: 2148 ng/mL (ref >498)
Hematocrit: 24.8 % — ABNORMAL LOW (ref 34.0–46.6)
Iron Saturation: 5 % — CL (ref 15–55)
Iron: 16 ug/dL — ABNORMAL LOW (ref 27–139)
Retic Ct Pct: 1.9 % (ref 0.6–2.6)
Total Iron Binding Capacity: 331 ug/dL (ref 250–450)
UIBC: 315 ug/dL (ref 118–369)
Vitamin B-12: >2000 pg/mL — ABNORMAL HIGH (ref 232–1245)

## 2017-05-09 NOTE — Assessment & Plan Note (Addendum)
amb ref to derm given hx of mohs surgery in [place]. New vascular appearing lesion on end of nose. Pt reports hx of bCC.

## 2017-05-09 NOTE — Assessment & Plan Note (Addendum)
?  hx of b12 deficiency. Check anemia panel today. Certainly could be contributing to tired feelings. Denies overt blood loss or melena. Offered FOBT, patient declined.

## 2017-05-09 NOTE — Assessment & Plan Note (Signed)
Change oxybutynin to detrol LA for less anticholinergic effects. This should be covered by insurance.

## 2017-05-09 NOTE — Assessment & Plan Note (Signed)
Continue current daily PPI.

## 2017-05-09 NOTE — Assessment & Plan Note (Signed)
Continue xarelto per cards. Stop ASA given bleeding risk and anemia.

## 2017-05-10 ENCOUNTER — Encounter: Payer: Self-pay | Admitting: Cardiology

## 2017-05-11 ENCOUNTER — Ambulatory Visit: Payer: Medicare Other | Admitting: Family Medicine

## 2017-05-26 ENCOUNTER — Other Ambulatory Visit: Payer: Self-pay

## 2017-05-26 DIAGNOSIS — I480 Paroxysmal atrial fibrillation: Secondary | ICD-10-CM

## 2017-05-26 MED ORDER — RIVAROXABAN 15 MG PO TABS: 15.0000 mg | ORAL_TABLET | Freq: Every day | ORAL | 1 refills | Status: DC

## 2017-05-31 LAB — CUP PACEART REMOTE DEVICE CHECK
Battery Remaining Longevity: 42 mo
Battery Remaining Percentage: 48 %
Brady Statistic RA Percent Paced: 91 %
Brady Statistic RV Percent Paced: 0 %
Date Time Interrogation Session: 20190208030500
HighPow Impedance: 45 Ohm
Implantable Lead Implant Date: 20111110
Implantable Lead Implant Date: 20111110
Implantable Lead Location: 753859
Implantable Lead Location: 753860
Implantable Lead Model: 157
Implantable Lead Model: 4135
Implantable Lead Serial Number: 28803817
Implantable Lead Serial Number: 302861
Implantable Pulse Generator Implant Date: 20111110
Lead Channel Impedance Value: 438 Ohm
Lead Channel Impedance Value: 653 Ohm
Lead Channel Pacing Threshold Amplitude: 0.5 V
Lead Channel Pacing Threshold Amplitude: 1 V
Lead Channel Pacing Threshold Pulse Width: 0.4 ms
Lead Channel Pacing Threshold Pulse Width: 0.4 ms
Lead Channel Setting Pacing Amplitude: 2 V
Lead Channel Setting Pacing Amplitude: 2.4 V
Lead Channel Setting Pacing Pulse Width: 0.4 ms
Lead Channel Setting Sensing Sensitivity: 0.6 mV
Pulse Gen Serial Number: 169112

## 2017-06-07 ENCOUNTER — Telehealth: Payer: Self-pay | Admitting: Cardiology

## 2017-06-07 DIAGNOSIS — I480 Paroxysmal atrial fibrillation: Secondary | ICD-10-CM

## 2017-06-07 NOTE — Telephone Encounter (Signed)
Pt c/o medication issue:  1. Name of Medication: Cardizem   2. How are you currently taking this medication (dosage and times per day)?360 mg Capsule once daily   3. Are you having a reaction (difficulty breathing--STAT)?Itching  4. What is your medication issue?Itching all over

## 2017-06-07 NOTE — Telephone Encounter (Signed)
Pt reports itching with Diltiazem.  She states: "I take it at night and it just makes my whole body itch,  I stopped taking it yesterday"  She also reports not being blood thinner b/c no one called in a new Rx.  Informed pt of the importance of this medication and that we did not stop it.  Informed that I would discuss both medications with pharmacist tomorrow and call her back. Pt is agreeable to plan.

## 2017-06-08 MED ORDER — RIVAROXABAN 15 MG PO TABS: 15.0000 mg | ORAL_TABLET | Freq: Every day | ORAL | 1 refills | Status: DC

## 2017-06-08 NOTE — Telephone Encounter (Signed)
Informed patient that Rx for Xarelto was sent at the beginning of month.  Explained that I would resend Rx today and for her to pick it up and restart it today. Pt agreeable to plan.  Pt advised to continue taking Lopressor at 25 mg BID and call the office if she begins to experience elevated HRs/symptoms secondary to AFib.  She understands I will discuss her case with Dr. Curt Bears next week and call her with an updated plan.

## 2017-06-13 NOTE — Telephone Encounter (Signed)
Dr. Curt Bears recommends changing to Verapamil Will forward to him for dosing advisement.

## 2017-06-15 MED ORDER — VERAPAMIL HCL ER 240 MG PO TBCR: 240.0000 mg | EXTENDED_RELEASE_TABLET | Freq: Every day | ORAL | 2 refills | Status: DC

## 2017-06-15 NOTE — Telephone Encounter (Signed)
Advised to start Verapamil 240 mg once daily. Rx sent to [facility] per request. Patient verbalized understanding and agreeable to plan.

## 2017-06-16 ENCOUNTER — Telehealth: Payer: Self-pay

## 2017-06-16 NOTE — Telephone Encounter (Signed)
Pt called nurse line- confused about metoprolol. States it looks different. Advised she was changed to 25mg  , that PCP is decreasing her dose- noted on notes to pharmacy. Pt reassured. Wallace Cullens, RN

## 2017-06-22 ENCOUNTER — Telehealth: Payer: Self-pay | Admitting: Cardiology

## 2017-06-22 MED ORDER — VERAPAMIL HCL ER 240 MG PO TBCR: 240.0000 mg | EXTENDED_RELEASE_TABLET | Freq: Every day | ORAL | 1 refills | Status: DC

## 2017-06-22 NOTE — Telephone Encounter (Signed)
Advised that I would send a 30 day supply to a local pharmacy. Advised to call me back if there is still an issue obtaining medication. Husband is agreeable to plan.

## 2017-06-22 NOTE — Telephone Encounter (Signed)
New Message    Pt c/o medication issue:  1. Name of Medication: verapamil (CALAN-SR) 240 MG CR tablet  2. How are you currently taking this medication (dosage and times per day)? 1 tablet daily 240mg  CR   3. Are you having a reaction (difficulty breathing--STAT)?   4. What is your medication issue? Patients spouse is calling in reference to medication. He states that [facility] advised that the medication is on back order so another rx would need to be sent. Please call to discuss.

## 2017-07-03 DIAGNOSIS — L570 Actinic keratosis: Secondary | ICD-10-CM | POA: Diagnosis not present

## 2017-07-03 DIAGNOSIS — D485 Neoplasm of uncertain behavior of skin: Secondary | ICD-10-CM | POA: Diagnosis not present

## 2017-07-03 DIAGNOSIS — L309 Dermatitis, unspecified: Secondary | ICD-10-CM | POA: Diagnosis not present

## 2017-07-03 DIAGNOSIS — C44311 Basal cell carcinoma of skin of nose: Secondary | ICD-10-CM | POA: Diagnosis not present

## 2017-07-04 ENCOUNTER — Encounter: Payer: Self-pay | Admitting: Family Medicine

## 2017-07-04 ENCOUNTER — Other Ambulatory Visit: Payer: Self-pay

## 2017-07-04 ENCOUNTER — Ambulatory Visit (INDEPENDENT_AMBULATORY_CARE_PROVIDER_SITE_OTHER): Payer: Medicare Other | Admitting: Family Medicine

## 2017-07-04 VITALS — BP 126/62 | HR 91 | Temp 97.4°F | Ht 61.0 in | Wt 135.4 lb

## 2017-07-04 DIAGNOSIS — D509 Iron deficiency anemia, unspecified: Secondary | ICD-10-CM | POA: Diagnosis present

## 2017-07-04 NOTE — Patient Instructions (Signed)
It was a pleasure to see you today! Thank you for choosing [facility] for your primary care. Brenda [name] was seen for shortness of breath.   Our plans for today were:  Start taking the iron.   The GI doctor's office will call you to schedule the colonoscopy.   You should return to our clinic to see Dr. Lindell Noe in 1-2 month to recheck shortness of breath.   Best,  Dr. Lindell Noe

## 2017-07-04 NOTE — Progress Notes (Signed)
   CC: SOB, fatigue  HPI  Fatigue - CBC was checked at previous visit, I ordered an iron panel recently. On b12 supplenmentation from previous PCP. States she used to take iron, has been a long time. Willing to try again. Can't recall why she stopped it, states she is not constipated. Reports last colonoscopy was normal, but this is from [place]. Recent endoscopy with [facility] with hiatal hernia and mild stricture which was dilated. No personal hx of abnormal colonoscopies.   Wt Readings from Last 3 Encounters:  07/04/17 135 lb 6.4 oz (61.4 kg)  05/05/17 137 lb 3.2 oz (62.2 kg)  05/03/17 134 lb (60.8 kg)    SOB - cards changed to verapamil. Been taking this. SOB with exertion, which is unchanged.   ROS: Denies CP, + mild bilateral abdominal pain, denies dysuria, denies melena.  CC, SH/smoking status, and VS noted  Objective: BP 126/62   Pulse 91   Temp (!) 97.4 F (36.3 C) (Oral)   Ht 5\' 1"  (1.549 m)   Wt 135 lb 6.4 oz (61.4 kg)   SpO2 99%   BMI 25.58 kg/m  Gen: NAD, alert, cooperative, and pleasant. HEENT: NCAT, EOMI, PERRL CV: RRR, no murmur Resp: CTAB, no wheezes, non-labored Abd: SNTND, BS present, no guarding or organomegaly Ext: No edema, warm Neuro: Alert and oriented, Speech clear, No gross deficits  Assessment and plan:  Iron deficiency anemia Given age, and iron deficiency anemia, concern for underlying malignancy.  Personally called [facility], and placed new referral for colonoscopy, they report they will call the patient.  Return to PCP after colonoscopy is completed.  Considered FOBT today, however patient needs colonoscopy regardless of this result and patient was hesitant.  Prescribed iron every other day.  Suspect fatigue and shortness of breath is likely due to anemia.   Orders Placed This Encounter  Procedures  . Ambulatory referral to Gastroenterology    Referral Priority:   Routine    Referral Type:   Consultation    Referral Reason:    Specialty Services Required    Number of Visits Requested:   1    Meds ordered this encounter  Medications  . Iron, Ferrous Sulfate, 142 (45 Fe) MG TBCR    Sig: Take 1 tablet by mouth every other day.    Dispense:  90 tablet    Refill:  1     Ralene Ok, MD, PGY2 07/05/2017 2:39 PM

## 2017-07-05 ENCOUNTER — Telehealth: Payer: Self-pay | Admitting: Family Medicine

## 2017-07-05 DIAGNOSIS — R5381 Other malaise: Secondary | ICD-10-CM

## 2017-07-05 MED ORDER — IRON (FERROUS SULFATE) 142 (45 FE) MG PO TBCR: 1.0000 | EXTENDED_RELEASE_TABLET | ORAL | 1 refills | Status: DC

## 2017-07-05 NOTE — Telephone Encounter (Signed)
Pt was seen today and said she forgot to ask Dr Lindell Noe what she needed to do in order a new walker. She said her walker is old and she has almost fallen due to the condition of the walker. Please contact patient.

## 2017-07-05 NOTE — Assessment & Plan Note (Signed)
Given age, and iron deficiency anemia, concern for underlying malignancy.  Personally called [facility], and placed new referral for colonoscopy, they report they will call the patient.  Return to PCP after colonoscopy is completed.  Considered FOBT today, however patient needs colonoscopy regardless of this result and patient was hesitant.  Prescribed iron every other day.  Suspect fatigue and shortness of breath is likely due to anemia.

## 2017-07-06 NOTE — Telephone Encounter (Signed)
Patient calling for new walking, I am placing DME order for this.

## 2017-07-17 ENCOUNTER — Emergency Department (HOSPITAL_COMMUNITY): Payer: Medicare Other

## 2017-07-17 ENCOUNTER — Encounter (HOSPITAL_COMMUNITY): Payer: Self-pay | Admitting: Emergency Medicine

## 2017-07-17 ENCOUNTER — Inpatient Hospital Stay (HOSPITAL_COMMUNITY)
Admission: EM | Admit: 2017-07-17 | Discharge: 2017-07-20 | DRG: 812 | Disposition: A | Discharge status: Home Health | Payer: Medicare Other | Attending: Family Medicine | Admitting: Family Medicine

## 2017-07-17 DIAGNOSIS — K2210 Ulcer of esophagus without bleeding: Secondary | ICD-10-CM | POA: Diagnosis present

## 2017-07-17 DIAGNOSIS — R7303 Prediabetes: Secondary | ICD-10-CM | POA: Diagnosis present

## 2017-07-17 DIAGNOSIS — Y92009 Unspecified place in unspecified non-institutional (private) residence as the place of occurrence of the external cause: Secondary | ICD-10-CM

## 2017-07-17 DIAGNOSIS — M79661 Pain in right lower leg: Secondary | ICD-10-CM | POA: Diagnosis not present

## 2017-07-17 DIAGNOSIS — K5730 Diverticulosis of large intestine without perforation or abscess without bleeding: Secondary | ICD-10-CM | POA: Diagnosis not present

## 2017-07-17 DIAGNOSIS — W010XXA Fall on same level from slipping, tripping and stumbling without subsequent striking against object, initial encounter: Secondary | ICD-10-CM | POA: Diagnosis present

## 2017-07-17 DIAGNOSIS — D500 Iron deficiency anemia secondary to blood loss (chronic): Principal | ICD-10-CM | POA: Diagnosis present

## 2017-07-17 DIAGNOSIS — Z888 Allergy status to other drugs, medicaments and biological substances status: Secondary | ICD-10-CM

## 2017-07-17 DIAGNOSIS — K649 Unspecified hemorrhoids: Secondary | ICD-10-CM | POA: Diagnosis not present

## 2017-07-17 DIAGNOSIS — S0990XA Unspecified injury of head, initial encounter: Secondary | ICD-10-CM | POA: Diagnosis not present

## 2017-07-17 DIAGNOSIS — D649 Anemia, unspecified: Secondary | ICD-10-CM | POA: Diagnosis present

## 2017-07-17 DIAGNOSIS — Z880 Allergy status to penicillin: Secondary | ICD-10-CM

## 2017-07-17 DIAGNOSIS — S0010XA Contusion of unspecified eyelid and periocular area, initial encounter: Secondary | ICD-10-CM | POA: Diagnosis not present

## 2017-07-17 DIAGNOSIS — Z9842 Cataract extraction status, left eye: Secondary | ICD-10-CM

## 2017-07-17 DIAGNOSIS — K449 Diaphragmatic hernia without obstruction or gangrene: Secondary | ICD-10-CM | POA: Diagnosis not present

## 2017-07-17 DIAGNOSIS — I5032 Chronic diastolic (congestive) heart failure: Secondary | ICD-10-CM | POA: Diagnosis present

## 2017-07-17 DIAGNOSIS — N183 Chronic kidney disease, stage 3 (moderate): Secondary | ICD-10-CM | POA: Diagnosis present

## 2017-07-17 DIAGNOSIS — K644 Residual hemorrhoidal skin tags: Secondary | ICD-10-CM | POA: Diagnosis present

## 2017-07-17 DIAGNOSIS — R079 Chest pain, unspecified: Secondary | ICD-10-CM | POA: Diagnosis not present

## 2017-07-17 DIAGNOSIS — M79642 Pain in left hand: Secondary | ICD-10-CM | POA: Diagnosis not present

## 2017-07-17 DIAGNOSIS — M79644 Pain in right finger(s): Secondary | ICD-10-CM | POA: Diagnosis present

## 2017-07-17 DIAGNOSIS — Z79899 Other long term (current) drug therapy: Secondary | ICD-10-CM

## 2017-07-17 DIAGNOSIS — Z7901 Long term (current) use of anticoagulants: Secondary | ICD-10-CM

## 2017-07-17 DIAGNOSIS — I493 Ventricular premature depolarization: Secondary | ICD-10-CM | POA: Diagnosis present

## 2017-07-17 DIAGNOSIS — I4581 Long QT syndrome: Secondary | ICD-10-CM | POA: Diagnosis present

## 2017-07-17 DIAGNOSIS — R1319 Other dysphagia: Secondary | ICD-10-CM | POA: Diagnosis present

## 2017-07-17 DIAGNOSIS — I130 Hypertensive heart and chronic kidney disease with heart failure and stage 1 through stage 4 chronic kidney disease, or unspecified chronic kidney disease: Secondary | ICD-10-CM | POA: Diagnosis present

## 2017-07-17 DIAGNOSIS — K5900 Constipation, unspecified: Secondary | ICD-10-CM | POA: Diagnosis not present

## 2017-07-17 DIAGNOSIS — I10 Essential (primary) hypertension: Secondary | ICD-10-CM | POA: Diagnosis not present

## 2017-07-17 DIAGNOSIS — Z85828 Personal history of other malignant neoplasm of skin: Secondary | ICD-10-CM

## 2017-07-17 DIAGNOSIS — R011 Cardiac murmur, unspecified: Secondary | ICD-10-CM | POA: Diagnosis present

## 2017-07-17 DIAGNOSIS — I480 Paroxysmal atrial fibrillation: Secondary | ICD-10-CM | POA: Diagnosis present

## 2017-07-17 DIAGNOSIS — Z881 Allergy status to other antibiotic agents status: Secondary | ICD-10-CM

## 2017-07-17 DIAGNOSIS — S3993XA Unspecified injury of pelvis, initial encounter: Secondary | ICD-10-CM | POA: Diagnosis not present

## 2017-07-17 DIAGNOSIS — I340 Nonrheumatic mitral (valve) insufficiency: Secondary | ICD-10-CM | POA: Diagnosis present

## 2017-07-17 DIAGNOSIS — Z803 Family history of malignant neoplasm of breast: Secondary | ICD-10-CM

## 2017-07-17 DIAGNOSIS — Z9049 Acquired absence of other specified parts of digestive tract: Secondary | ICD-10-CM

## 2017-07-17 DIAGNOSIS — S0081XA Abrasion of other part of head, initial encounter: Secondary | ICD-10-CM | POA: Diagnosis present

## 2017-07-17 DIAGNOSIS — Z66 Do not resuscitate: Secondary | ICD-10-CM | POA: Diagnosis present

## 2017-07-17 DIAGNOSIS — S81811A Laceration without foreign body, right lower leg, initial encounter: Secondary | ICD-10-CM | POA: Diagnosis present

## 2017-07-17 DIAGNOSIS — Z833 Family history of diabetes mellitus: Secondary | ICD-10-CM

## 2017-07-17 DIAGNOSIS — M810 Age-related osteoporosis without current pathological fracture: Secondary | ICD-10-CM | POA: Diagnosis present

## 2017-07-17 DIAGNOSIS — Q399 Congenital malformation of esophagus, unspecified: Secondary | ICD-10-CM | POA: Diagnosis not present

## 2017-07-17 DIAGNOSIS — Z95810 Presence of automatic (implantable) cardiac defibrillator: Secondary | ICD-10-CM

## 2017-07-17 DIAGNOSIS — S8991XA Unspecified injury of right lower leg, initial encounter: Secondary | ICD-10-CM | POA: Diagnosis not present

## 2017-07-17 DIAGNOSIS — K922 Gastrointestinal hemorrhage, unspecified: Secondary | ICD-10-CM | POA: Diagnosis not present

## 2017-07-17 DIAGNOSIS — D509 Iron deficiency anemia, unspecified: Secondary | ICD-10-CM | POA: Diagnosis not present

## 2017-07-17 DIAGNOSIS — E119 Type 2 diabetes mellitus without complications: Secondary | ICD-10-CM | POA: Diagnosis not present

## 2017-07-17 DIAGNOSIS — K224 Dyskinesia of esophagus: Secondary | ICD-10-CM | POA: Diagnosis present

## 2017-07-17 DIAGNOSIS — Z8673 Personal history of transient ischemic attack (TIA), and cerebral infarction without residual deficits: Secondary | ICD-10-CM

## 2017-07-17 DIAGNOSIS — K5710 Diverticulosis of small intestine without perforation or abscess without bleeding: Secondary | ICD-10-CM | POA: Diagnosis not present

## 2017-07-17 DIAGNOSIS — K219 Gastro-esophageal reflux disease without esophagitis: Secondary | ICD-10-CM | POA: Diagnosis present

## 2017-07-17 DIAGNOSIS — Z8719 Personal history of other diseases of the digestive system: Secondary | ICD-10-CM

## 2017-07-17 DIAGNOSIS — Z8679 Personal history of other diseases of the circulatory system: Secondary | ICD-10-CM

## 2017-07-17 DIAGNOSIS — Z882 Allergy status to sulfonamides status: Secondary | ICD-10-CM

## 2017-07-17 DIAGNOSIS — Z8249 Family history of ischemic heart disease and other diseases of the circulatory system: Secondary | ICD-10-CM

## 2017-07-17 DIAGNOSIS — S6992XA Unspecified injury of left wrist, hand and finger(s), initial encounter: Secondary | ICD-10-CM | POA: Diagnosis not present

## 2017-07-17 DIAGNOSIS — S0003XA Contusion of scalp, initial encounter: Secondary | ICD-10-CM | POA: Diagnosis not present

## 2017-07-17 DIAGNOSIS — S0083XA Contusion of other part of head, initial encounter: Secondary | ICD-10-CM

## 2017-07-17 DIAGNOSIS — Z9181 History of falling: Secondary | ICD-10-CM

## 2017-07-17 DIAGNOSIS — E785 Hyperlipidemia, unspecified: Secondary | ICD-10-CM | POA: Diagnosis present

## 2017-07-17 DIAGNOSIS — K5909 Other constipation: Secondary | ICD-10-CM | POA: Diagnosis present

## 2017-07-17 DIAGNOSIS — K259 Gastric ulcer, unspecified as acute or chronic, without hemorrhage or perforation: Secondary | ICD-10-CM | POA: Diagnosis not present

## 2017-07-17 DIAGNOSIS — S199XXA Unspecified injury of neck, initial encounter: Secondary | ICD-10-CM | POA: Diagnosis not present

## 2017-07-17 DIAGNOSIS — M25561 Pain in right knee: Secondary | ICD-10-CM | POA: Diagnosis present

## 2017-07-17 DIAGNOSIS — R1310 Dysphagia, unspecified: Secondary | ICD-10-CM | POA: Diagnosis not present

## 2017-07-17 LAB — CBC WITH DIFFERENTIAL/PLATELET
Basophils Absolute: 0 10*3/uL (ref 0.0–0.1)
Basophils Relative: 0 %
Eosinophils Absolute: 0.2 10*3/uL (ref 0.0–0.7)
Eosinophils Relative: 2 %
HCT: 20.6 % — ABNORMAL LOW (ref 36.0–46.0)
Hemoglobin: 6.3 g/dL — CL (ref 12.0–15.0)
Lymphocytes Relative: 15 %
Lymphs Abs: 1.2 10*3/uL (ref 0.7–4.0)
MCH: 25 pg — ABNORMAL LOW (ref 26.0–34.0)
MCHC: 30.6 g/dL (ref 30.0–36.0)
MCV: 81.7 fL (ref 78.0–100.0)
Monocytes Absolute: 0.6 10*3/uL (ref 0.1–1.0)
Monocytes Relative: 7 %
Neutro Abs: 6.3 10*3/uL (ref 1.7–7.7)
Neutrophils Relative %: 76 %
Platelets: 289 10*3/uL (ref 150–400)
RBC: 2.52 MIL/uL — ABNORMAL LOW (ref 3.87–5.11)
RDW: 16.2 % — ABNORMAL HIGH (ref 11.5–15.5)
WBC: 8.3 10*3/uL (ref 4.0–10.5)

## 2017-07-17 LAB — BASIC METABOLIC PANEL
Anion gap: 11 (ref 5–15)
BUN: 22 mg/dL — ABNORMAL HIGH (ref 6–20)
CO2: 19 mmol/L — ABNORMAL LOW (ref 22–32)
Calcium: 8.6 mg/dL — ABNORMAL LOW (ref 8.9–10.3)
Chloride: 103 mmol/L (ref 101–111)
Creatinine, Ser: 1.5 mg/dL — ABNORMAL HIGH (ref 0.44–1.00)
GFR calc Af Amer: 36 mL/min — ABNORMAL LOW (ref >60)
GFR calc non Af Amer: 31 mL/min — ABNORMAL LOW (ref >60)
Glucose, Bld: 159 mg/dL — ABNORMAL HIGH (ref 65–99)
Potassium: 3.8 mmol/L (ref 3.5–5.1)
Sodium: 133 mmol/L — ABNORMAL LOW (ref 135–145)

## 2017-07-17 LAB — ABO/RH: ABO/RH(D): O NEG

## 2017-07-17 LAB — PREPARE RBC (CROSSMATCH)

## 2017-07-17 MED ORDER — PANTOPRAZOLE SODIUM 40 MG IV SOLR
40.0000 mg | Freq: Once | INTRAVENOUS | Status: AC
  Administered 2017-07-18: 40 mg via INTRAVENOUS
  Filled 2017-07-17: qty 40

## 2017-07-17 MED ORDER — ATORVASTATIN CALCIUM 40 MG PO TABS
40.0000 mg | ORAL_TABLET | Freq: Every day | ORAL | Status: DC
  Administered 2017-07-18 – 2017-07-20 (×3): 40 mg via ORAL
  Filled 2017-07-17 (×3): qty 1

## 2017-07-17 MED ORDER — ACETAMINOPHEN 325 MG PO TABS
650.0000 mg | ORAL_TABLET | Freq: Four times a day (QID) | ORAL | Status: DC | PRN
  Administered 2017-07-18 – 2017-07-19 (×2): 650 mg via ORAL
  Filled 2017-07-17 (×2): qty 2

## 2017-07-17 MED ORDER — VERAPAMIL HCL ER 240 MG PO TBCR
240.0000 mg | EXTENDED_RELEASE_TABLET | Freq: Every day | ORAL | Status: DC
  Administered 2017-07-18 – 2017-07-19 (×3): 240 mg via ORAL
  Filled 2017-07-17 (×4): qty 1

## 2017-07-17 MED ORDER — ACETAMINOPHEN 650 MG RE SUPP: 650.0000 mg | Freq: Four times a day (QID) | RECTAL | Status: DC | PRN

## 2017-07-17 MED ORDER — FERROUS SULFATE 325 (65 FE) MG PO TABS
325.0000 mg | ORAL_TABLET | ORAL | Status: DC
  Administered 2017-07-18: 325 mg via ORAL
  Filled 2017-07-17: qty 1

## 2017-07-17 MED ORDER — PANTOPRAZOLE SODIUM 40 MG PO TBEC
40.0000 mg | DELAYED_RELEASE_TABLET | Freq: Every day | ORAL | Status: DC
  Administered 2017-07-18: 40 mg via ORAL
  Filled 2017-07-17: qty 1

## 2017-07-17 MED ORDER — METOPROLOL TARTRATE 25 MG PO TABS
25.0000 mg | ORAL_TABLET | Freq: Two times a day (BID) | ORAL | Status: DC
  Administered 2017-07-18 – 2017-07-20 (×6): 25 mg via ORAL
  Filled 2017-07-17 (×6): qty 1

## 2017-07-17 MED ORDER — SODIUM CHLORIDE 0.9 % IV SOLN
10.0000 mL/h | Freq: Once | INTRAVENOUS | Status: AC
  Administered 2017-07-17: 10 mL/h via INTRAVENOUS

## 2017-07-17 NOTE — ED Triage Notes (Signed)
Pt here from home where she tripped fell landing on floor hitting her head , pt is on a blood thinners , no loc

## 2017-07-17 NOTE — ED Notes (Signed)
Patient taken to XRAY

## 2017-07-17 NOTE — H&P (Signed)
[facility] Admission History and Physical Service Pager: [phone]  Patient name: Brenda [name] Medical record number: [id number] Date of birth: 13-Feb-1932 Age: 84 y.o. Gender: female  Primary Care Provider: Sela Hilding, MD Consultants: none Code Status: DNR/DNI confirmed on admission with family at bedside  Chief Complaint: Fall  Assessment and Plan: Brenda [name] is a 84 y.o. female presenting with fall, found to have symptomatic anemia . PMH is significant for T2DM, Paroxysmal Afib on xarelto (held), AICD in place, T2DM, HTN  Fall w/multiple abrasions- witnessed by her husband. Uncertain whether mechanical or syncopal episode. Patient does not remember falling. No prodrome such as palpitations, dyspnea or dizziness. She does have resulting abrasions over her right forearm, right shin, right face, and left thumb. CT head, CT cervical spine, DG hand left, DG pelvis, DG knee, DG right tib/fib all negative for acute osseous abnormality. CT head without acute bleed.  Uncertain whether fall is related to anemia with Hgb 6.3 on admission. 2 prior falls this year per family at bedside. - admit to [facility] attending Dr. Ardelia Mems - monitor on telemetry - check cardiac echo - monitor neuro checks q4H - check cardiac echo - check orthostatics - up with assistance - PT/OT  Acute on chronic anemia - May be IDA, however have not ruled out a slow GI bleed. Hgb 6.3 on admission, down from 7.9 on last check 04/25/2017. Unclear whether patient is symptomatic with fall. Overall denying dyspnea, palpitations, CP.  No obvious blood loss. She has mild abdominal pain but denies melena or hematochezia. Of note she is chronically anticoagulated on Xarelto for afib. Recent outpt anemia panel c/w iron deficiency anemia, notable for Iron low at 16, TIBC 331, Iron sat low at 5%, folate WNL.  - transfuse 1u PRBC ordered in ED - post H/H - hold home xarelto for now, SCDs for  anticoagulation - continue ferrous sulfate 325 mg qOD - FOBT (unable to perform in ED as pt was in hallway bed)  Paroxysmal afib - NSR on admission EKG, and rate controlled. Home meds include: lopressor 25 mg BID and Xarelto. - hold xarelto as noted above in setting of possible bleed/anemia - continue home lopressor for rate control  HTN 149/72 on admission. Home medication Verapamil 240 mg QHS reordered.  Prediabetes - A1c 5.7 on 04/2016 test. Monitor glucose on AM BMP.  Hx TIA  Patient denies hx of stroke, family at bedside endorses her hx of "mini stroke" without residual deficits. Home medication includes atorvastatin 40 mg daily. Not on ASA. - continue home statin  AICD present  - noted.  Prolonged QTC - 493 on admission. Avoid QTc prolonging medications.  FEN/GI: HH diet Prophylaxis: SCDs, holding anticoagulation in setting of ?bleed/anemia  Disposition: Admit FPTS telemetry  History of Present Illness:  Brenda [name] is a 84 y.o. female presenting after a fall, found to be anemic to 6.3.  Patient reports that she was in her usual state of health until earlier today when she was walking across her house with her walker and she fell down.  This was a witnessed fall by her husband.  Patient does not feel that she tripped, however does not endorse prodrome, no dizziness, palpitation, shortness of breath, or loss of consciousness.  Patient did hit the right side of her head as well as her right forearm and right tibia which have resulting abrasions.  This prompted ED visit.    In the emergency department patient was noted to be anemic at  6.3.  It is unclear whether this is symptomatic, though patient does endorse being unsteady on her feet.  Her last hemoglobin was checked in 03/2017 and was 7.8.  Iron studies by her PCP indicated iron deficiency anemia and patient was started on oral iron supplementation.  Patient is denying melena or hematochezia.  She reports last colonoscopy may  have been for 5 years ago she lived in [place], and she does not believe it was abnormal.  She denies chest pain.  She denies cough, rhinorrhea, congestion.  She denies nausea/vomiting/diarrhea/constipation.  She does endorse lower abdominal "soreness" however is denying dysuria or urinary frequency.  Review Of Systems: per hpi  ROS  Patient Active Problem List   Diagnosis Date Noted  . Symptomatic anemia 07/17/2017  . Iron deficiency anemia 05/09/2017  . Osteoporosis 11/24/2016  . Skin cancer of nose 11/23/2016  . 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 UTI (urinary tract infection) 10/26/2016  . Paroxysmal A-fib (Brenda [name]) 08/05/2016  . AKI (acute kidney injury) ([place]) 05/25/2016  . Essential hypertension 05/05/2016  . Type 2 diabetes mellitus without complication, without long-term current use of insulin ([place]) 05/05/2016  . Hiatal hernia 05/05/2016  . History of CVA (cerebrovascular accident) without residual deficits 05/05/2016  . Memory change 05/05/2016  . AICD (automatic cardioverter/defibrillator) present 05/05/2016  . Back pain 05/05/2016    Past Medical History: Past Medical History:  Diagnosis Date  . Anemia   . Arthritis   . Basal cell carcinoma    nose  . Diabetes mellitus without complication ([place])    pt denies diabetes  . GERD (gastroesophageal reflux disease)   . Hiatal hernia   . HLD (hyperlipidemia)   . Hypertension   . Seizure ([place]) 2011    Past Surgical History: Past Surgical History:  Procedure Laterality Date  . CARDIAC DEFIBRILLATOR PLACEMENT    . CATARACT EXTRACTION Left 02/2001  . CHOLECYSTECTOMY    . LUMBAR LAMINECTOMY  06/11/2001  . LUMBAR LAMINECTOMY  02/09/2006  . LUMBAR LAMINECTOMY  07/07/2006   with spianal cord stimulator  . SPINAL CORD STIMULATOR REMOVAL  14/2009  . TUBAL LIGATION      Social History: Social History   Tobacco Use  . Smoking status: Never Smoker  . Smokeless tobacco: Never Used  Substance Use Topics  . Alcohol  use: No  . Drug use: No   Additional social history:   Please also refer to relevant sections of EMR.  Family History: Family History  Problem Relation Age of Onset  . Diabetes Mother   . Heart failure Father   . Breast cancer Sister   . Heart failure Sister   . Diabetes Son   . Diabetes Sister   . Colon cancer Neg Hx   . Stomach cancer Neg Hx   . Rectal cancer Neg Hx   . Esophageal cancer Neg Hx    (If not completed, MUST add something in)  Allergies and Medications: Allergies  Allergen Reactions  . Sotalol Other (See Comments)    Seizures   . Erythromycin Nausea Only  . Augmentin [Amoxicillin-Pot Clavulanate] Rash  . Crestor [Rosuvastatin Calcium] Rash  . Keflex [Cephalexin] Rash  . Septra [Sulfamethoxazole-Trimethoprim] Rash   No current facility-administered medications on file prior to encounter.    Current Outpatient Medications on File Prior to Encounter  Medication Sig Dispense Refill  . atorvastatin (LIPITOR) 40 MG tablet Take 1 tablet (40 mg total) by mouth daily. 90 tablet 3  . Cholecalciferol (VITAMIN D-3) 1000 units CAPS Take  1,000 Units by mouth daily.    . metoprolol tartrate (LOPRESSOR) 25 MG tablet Take 1 tablet (25 mg total) by mouth 2 (two) times daily. 30 tablet 0  . Multiple Vitamins-Minerals (ICAPS AREDS 2 PO) Take 1 capsule by mouth 2 (two) times daily.     Marland Kitchen omeprazole (PRILOSEC) 40 MG capsule Take 1 capsule (40 mg total) by mouth daily. 90 capsule 1  . Propylene Glycol (SYSTANE COMPLETE) 0.6 % SOLN Place 1 drop into both eyes daily.    . Rivaroxaban (XARELTO) 15 MG TABS tablet Take 1 tablet (15 mg total) by mouth daily with supper. 90 tablet 1  . tolterodine (DETROL LA) 2 MG 24 hr capsule Take 1 capsule (2 mg total) by mouth daily. 90 capsule 0  . verapamil (CALAN-SR) 240 MG CR tablet Take 1 tablet (240 mg total) by mouth at bedtime. 30 tablet 1  . Iron, Ferrous Sulfate, 142 (45 Fe) MG TBCR Take 1 tablet by mouth every other day. 90 tablet 1     Objective: BP (!) 140/113 Comment: bp meds given  Pulse 85   Temp 98.1 F (36.7 C) (Oral)   Resp 18   Ht 5\' 1"  (1.549 m)   Wt 136 lb 0.4 oz (61.7 kg)   SpO2 100%   BMI 25.70 kg/m  Exam: General: NAD, rests comfortably in hallway bed in the ED, appears pale Eyes: EOMI, PERRLA, no scleral icterus ENTM: MMM, no pharyngeal erythema or exudate Neck: Supple Cardiovascular: 3/6 systolic murmur, regular rate and rhythm Respiratory: CTA bil, no W/R/R Gastrointestinal: soft, mildly tender diffusely to palpation without rebound, guarding, no HSM, neg murphy MSK: moves 4 extremities Derm: general pallor, +abrasions noted over right side of face, right forearm, dressed lesion over right shin Neuro: CN II-XII intact Psych: AAOx3, appropriate affect, thought process linear  Labs and Imaging: CBC BMET  Recent Labs  Lab 07/17/17 1700  WBC 8.3  HGB 6.3*  HCT 20.6*  PLT 289   Recent Labs  Lab 07/17/17 1700  NA 133*  K 3.8  CL 103  CO2 19*  BUN 22*  CREATININE 1.50*  GLUCOSE 159*  CALCIUM 8.6*     EKG: NSR with premature ventricular complexes, prolonged QTc at 493  Dg Chest 2 View 07/17/2017 IMPRESSION: No active cardiopulmonary disease. AICD.   Dg Pelvis 1-2 Views 07/17/2017 IMPRESSION: No acute osseous abnormality.  Dg Knee 2 Views Right 07/17/2017 IMPRESSION: No acute findings.  Dg Tibia/fibula Right 07/17/2017 IMPRESSION: No acute finding.   Ct head and Ct Cervical Spine Wo Contrast: 07/17/2017 IMPRESSION: Advanced atrophy. No skull fracture or intracranial hemorrhage. RIGHT frontal scalp hematoma. Cervical spondylosis most pronounced C5-6 and C6-7. No cervical spine fracture or traumatic subluxation.   Dg Hand Complete Left 07/17/2017 IMPRESSION: No acute fracture or dislocation identified about the left hand. Osteoarthritic changes.   Everrett Coombe, MD 07/18/2017, 1:27 AM PGY-2, [place] [facility] pager: [phone], text pages welcome

## 2017-07-17 NOTE — ED Provider Notes (Signed)
[facility] EMERGENCY DEPARTMENT Provider Note   CSN: [id number] Arrival date & time: 07/17/17  1421     History   Chief Complaint Chief Complaint  Patient presents with  . Fall    HPI Brenda [name] is a 84 y.o. female.  Patient is an 84 year old female with history of paroxysmal atrial fibrillation, hypertension on Xarelto who presents the ED after fallen while walking with her walker.  Patient fell and hit her head and right side of her body while walking with her walker.  She denies any loss of consciousness, dizziness, chest pain, shortness of breath.  Patient states that fall happened very fast she cannot remember the details fully.  Husband states that she was just walking with a walker and it seemed that she tripped over.  Patient with pain in her left thumb, right knee, right shin.  Has abrasion she states over her right shin.  The history is provided by the patient.  Fall  This is a new problem. The current episode started less than 1 hour ago. The problem has been resolved. Pertinent negatives include no chest pain, no abdominal pain, no headaches and no shortness of breath. Nothing aggravates the symptoms. Nothing relieves the symptoms. She has tried nothing for the symptoms.    Past Medical History:  Diagnosis Date  . Anemia   . Arthritis   . Basal cell carcinoma    nose  . Diabetes mellitus without complication ([place])    pt denies diabetes  . GERD (gastroesophageal reflux disease)   . Hiatal hernia   . HLD (hyperlipidemia)   . Hypertension   . Seizure [facility]) 2011    Patient Active Problem List   Diagnosis Date Noted  . Symptomatic anemia 07/17/2017  . Iron deficiency anemia 05/09/2017  . Osteoporosis 11/24/2016  . Skin cancer of nose 11/23/2016  . 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 UTI (urinary tract infection) 10/26/2016  . Paroxysmal A-fib ([place]) 08/05/2016  . AKI (acute kidney injury) ([place]) 05/25/2016  . Essential hypertension  05/05/2016  . Type 2 diabetes mellitus without complication, without long-term current use of insulin ([place]) 05/05/2016  . Hiatal hernia 05/05/2016  . History of CVA (cerebrovascular accident) without residual deficits 05/05/2016  . Memory change 05/05/2016  . AICD (automatic cardioverter/defibrillator) present 05/05/2016  . Back pain 05/05/2016    Past Surgical History:  Procedure Laterality Date  . CARDIAC DEFIBRILLATOR PLACEMENT    . CATARACT EXTRACTION Left 02/2001  . CHOLECYSTECTOMY    . LUMBAR LAMINECTOMY  06/11/2001  . LUMBAR LAMINECTOMY  02/09/2006  . LUMBAR LAMINECTOMY  07/07/2006   with spianal cord stimulator  . SPINAL CORD STIMULATOR REMOVAL  14/2009  . TUBAL LIGATION       OB History   None      Home Medications    Prior to Admission medications   Medication Sig Start Date End Date Taking? Authorizing Provider  atorvastatin (LIPITOR) 40 MG tablet Take 1 tablet (40 mg total) by mouth daily. 06/17/16  Yes Sela Hilding, MD  Cholecalciferol (VITAMIN D-3) 1000 units CAPS Take 1,000 Units by mouth daily.   Yes [provider]  metoprolol tartrate (LOPRESSOR) 25 MG tablet Take 1 tablet (25 mg total) by mouth 2 (two) times daily. 05/03/17 08/01/17 Yes Camnitz, Will Hassell Done, MD  Multiple Vitamins-Minerals (ICAPS AREDS 2 PO) Take 1 capsule by mouth 2 (two) times daily.    Yes [provider]  omeprazole (PRILOSEC) 40 MG capsule Take 1 capsule (40 mg total)  by mouth daily. 05/20/16  Yes Armbruster, Carlota Raspberry, MD  Propylene Glycol (SYSTANE COMPLETE) 0.6 % SOLN Place 1 drop into both eyes daily.   Yes [provider]  Rivaroxaban (XARELTO) 15 MG TABS tablet Take 1 tablet (15 mg total) by mouth daily with supper. 06/08/17  Yes Camnitz, Ocie Doyne, MD  tolterodine (DETROL LA) 2 MG 24 hr capsule Take 1 capsule (2 mg total) by mouth daily. 05/05/17  Yes Sela Hilding, MD  verapamil (CALAN-SR) 240 MG CR tablet Take 1 tablet (240 mg total) by mouth at  bedtime. 06/22/17  Yes Camnitz, Will Hassell Done, MD  Iron, Ferrous Sulfate, 142 (45 Fe) MG TBCR Take 1 tablet by mouth every other day. 07/05/17   Sela Hilding, MD    Family History Family History  Problem Relation Age of Onset  . Diabetes Mother   . Heart failure Father   . Breast cancer Sister   . Heart failure Sister   . Diabetes Son   . Diabetes Sister   . Colon cancer Neg Hx   . Stomach cancer Neg Hx   . Rectal cancer Neg Hx   . Esophageal cancer Neg Hx     Social History Social History   Tobacco Use  . Smoking status: Never Smoker  . Smokeless tobacco: Never Used  Substance Use Topics  . Alcohol use: No  . Drug use: No     Allergies   Sotalol; Erythromycin; Augmentin [amoxicillin-pot clavulanate]; Crestor [rosuvastatin calcium]; Keflex [cephalexin]; and Septra [sulfamethoxazole-trimethoprim]   Review of Systems Review of Systems  Constitutional: Negative for chills and fever.  HENT: Negative for ear pain and sore throat.   Eyes: Negative for pain and visual disturbance.  Respiratory: Negative for cough and shortness of breath.   Cardiovascular: Negative for chest pain and palpitations.  Gastrointestinal: Negative for abdominal pain and vomiting.  Genitourinary: Negative for dysuria and hematuria.  Musculoskeletal: Negative for arthralgias and back pain.  Skin: Positive for wound. Negative for color change and rash.  Neurological: Negative for seizures, syncope and headaches.  Hematological: Bruises/bleeds easily (on xarelto).  All other systems reviewed and are negative.    Physical Exam Updated Vital Signs  ED Triage Vitals [07/17/17 1432]  Enc Vitals Group     BP (!) 122/58     Pulse Rate 72     Resp 16     Temp 98.2 F (36.8 C)     Temp Source Oral     SpO2 100 %     Weight      Height      Head Circumference      Peak Flow      Pain Score      Pain Loc      Pain Edu?      Excl. in [place]?     Physical Exam  Constitutional: She appears  well-developed and well-nourished. No distress.  HENT:  Head: Normocephalic and atraumatic.  Eyes: Pupils are equal, round, and reactive to light. Conjunctivae and EOM are normal.  Neck: Normal range of motion. Neck supple.  Cardiovascular: Normal rate, regular rhythm, normal heart sounds and intact distal pulses.  No murmur heard. Pulmonary/Chest: Effort normal and breath sounds normal. No respiratory distress.  Abdominal: Soft. There is no tenderness.  Musculoskeletal: Normal range of motion. She exhibits tenderness (TTP to right tibia and right knee with mild swelling around knee). She exhibits no edema.  No midline spinal tenderness  Neurological: She is alert.  Skin: Skin is  warm and dry.  Skin tear to right shin, abrasion and hematoma over right side of forehead  Psychiatric: She has a normal mood and affect.  Nursing note and vitals reviewed.    ED Treatments / Results  Labs (all labs ordered are listed, but only abnormal results are displayed) Labs Reviewed  CBC WITH DIFFERENTIAL/PLATELET - Abnormal; Notable for the following components:      Result Value   RBC 2.52 (*)    Hemoglobin 6.3 (*)    HCT 20.6 (*)    MCH 25.0 (*)    RDW 16.2 (*)    All other components within normal limits  BASIC METABOLIC PANEL - Abnormal; Notable for the following components:   Sodium 133 (*)    CO2 19 (*)    Glucose, Bld 159 (*)    BUN 22 (*)    Creatinine, Ser 1.50 (*)    Calcium 8.6 (*)    GFR calc non Af Amer 31 (*)    GFR calc Af Amer 36 (*)    All other components within normal limits  TYPE AND SCREEN  PREPARE RBC (CROSSMATCH)    EKG EKG Interpretation  Date/Time:  Monday July 17 2017 16:39:27 EDT Ventricular Rate:  86 PR Interval:  180 QRS Duration: 84 QT Interval:  412 QTC Calculation: 493 R Axis:   29 Text Interpretation:  Sinus rhythm with occasional Premature ventricular complexes Prolonged QT Abnormal ECG No significant change since last tracing Confirmed by  Deno Etienne [phone]) on 07/17/2017 4:45:14 PM   Radiology Dg Chest 2 View  Result Date: 07/17/2017 CLINICAL DATA:  Chest pain after fall. EXAM: CHEST - 2 VIEW COMPARISON:  Chest x-ray dated Jul 30, 2016. FINDINGS: Unchanged left chest wall AICD. The heart remains at the upper limits of normal in size. Normal pulmonary vascularity. No focal consolidation, pleural effusion, or pneumothorax. Unchanged moderate hiatal hernia. No acute osseous abnormality. IMPRESSION: No active cardiopulmonary disease. Electronically Signed   By: Titus Dubin M.D.   On: 07/17/2017 15:59   Dg Pelvis 1-2 Views  Result Date: 07/17/2017 CLINICAL DATA:  Fall. EXAM: PELVIS - 1-2 VIEW COMPARISON:  None. FINDINGS: There is no evidence of acute pelvic fracture or diastasis. Old healed fracture of the left inferior pubic ramus. No pelvic bone lesions are seen. Extensive lumbar spinal fusion hardware. Osteopenia. IMPRESSION: 1.  No acute osseous abnormality. Electronically Signed   By: Titus Dubin M.D.   On: 07/17/2017 15:58   Dg Knee 2 Views Right  Result Date: 07/17/2017 CLINICAL DATA:  Status post fall.  Distal right lower leg pain. EXAM: RIGHT KNEE - 1-2 VIEW COMPARISON:  None. FINDINGS: No evidence of fracture, dislocation, or joint effusion. No evidence of arthropathy or other focal bone abnormality. Vascular calcifications noted. IMPRESSION: 1. No acute findings. Electronically Signed   By: Kerby Moors M.D.   On: 07/17/2017 15:58   Dg Tibia/fibula Right  Result Date: 07/17/2017 CLINICAL DATA:  Fall with right leg pain.  Initial encounter. EXAM: RIGHT TIBIA AND FIBULA - 2 VIEW COMPARISON:  None. FINDINGS: Negative for acute fracture or malalignment. Osteopenia.  Vascular calcification. IMPRESSION: No acute finding. Electronically Signed   By: Monte Fantasia M.D.   On: 07/17/2017 16:08   Ct Head Wo Contrast  Result Date: 07/17/2017 CLINICAL DATA:  C-spine trauma, tripped and fell, patient on blood thinners. EXAM:  CT HEAD WITHOUT CONTRAST CT CERVICAL SPINE WITHOUT CONTRAST TECHNIQUE: Multidetector CT imaging of the head and cervical spine was performed following  the standard protocol without intravenous contrast. Multiplanar CT image reconstructions of the cervical spine were also generated. COMPARISON:  CT head 07/30/2016. FINDINGS: CT HEAD FINDINGS Brain: No evidence for acute infarction, hemorrhage, mass lesion, or extra-axial fluid. Advanced atrophy. Hydrocephalus ex vacuo. Extensive hypoattenuation of white matter, likely small vessel disease. Vascular: Calcification of the cavernous internal carotid arteries consistent with cerebrovascular atherosclerotic disease. No signs of intracranial large vessel occlusion. Skull: No skull fracture.  RIGHT frontal soft tissue swelling. Sinuses/Orbits: No layering sinus fluid. BILATERAL cataract extraction. Other: None. CT CERVICAL SPINE FINDINGS Alignment: Exaggerated lordosis.  No traumatic subluxation. Skull base and vertebrae: No acute fracture. No primary bone lesion or focal pathologic process. BILATERAL facet arthropathy. Osseous spurring. Soft tissues and spinal canal: Unremarkable. Disc levels: Advanced disc space narrowing C5-6 and C6-7. Vacuum phenomenon in the foramen C6-7 on the LEFT. Upper chest: Lung apices clear. Other: None. IMPRESSION: Advanced atrophy. No skull fracture or intracranial hemorrhage. RIGHT frontal scalp hematoma. Cervical spondylosis most pronounced C5-6 and C6-7. No cervical spine fracture or traumatic subluxation. Electronically Signed   By: Staci Righter M.D.   On: 07/17/2017 16:32   Ct Cervical Spine Wo Contrast  Result Date: 07/17/2017 CLINICAL DATA:  C-spine trauma, tripped and fell, patient on blood thinners. EXAM: CT HEAD WITHOUT CONTRAST CT CERVICAL SPINE WITHOUT CONTRAST TECHNIQUE: Multidetector CT imaging of the head and cervical spine was performed following the standard protocol without intravenous contrast. Multiplanar CT image  reconstructions of the cervical spine were also generated. COMPARISON:  CT head 07/30/2016. FINDINGS: CT HEAD FINDINGS Brain: No evidence for acute infarction, hemorrhage, mass lesion, or extra-axial fluid. Advanced atrophy. Hydrocephalus ex vacuo. Extensive hypoattenuation of white matter, likely small vessel disease. Vascular: Calcification of the cavernous internal carotid arteries consistent with cerebrovascular atherosclerotic disease. No signs of intracranial large vessel occlusion. Skull: No skull fracture.  RIGHT frontal soft tissue swelling. Sinuses/Orbits: No layering sinus fluid. BILATERAL cataract extraction. Other: None. CT CERVICAL SPINE FINDINGS Alignment: Exaggerated lordosis.  No traumatic subluxation. Skull base and vertebrae: No acute fracture. No primary bone lesion or focal pathologic process. BILATERAL facet arthropathy. Osseous spurring. Soft tissues and spinal canal: Unremarkable. Disc levels: Advanced disc space narrowing C5-6 and C6-7. Vacuum phenomenon in the foramen C6-7 on the LEFT. Upper chest: Lung apices clear. Other: None. IMPRESSION: Advanced atrophy. No skull fracture or intracranial hemorrhage. RIGHT frontal scalp hematoma. Cervical spondylosis most pronounced C5-6 and C6-7. No cervical spine fracture or traumatic subluxation. Electronically Signed   By: Staci Righter M.D.   On: 07/17/2017 16:32   Dg Hand Complete Left  Result Date: 07/17/2017 CLINICAL DATA:  Status post fall with pain. EXAM: LEFT HAND - COMPLETE 3+ VIEW COMPARISON:  None. FINDINGS: There is no evidence of fracture or dislocation. 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 IMPRESSION: No acute fracture or dislocation identified about the left hand. Osteoarthritic changes. Electronically Signed   By: Fidela Salisbury M.D.   On: 07/17/2017 15:59    Procedures Procedures (including critical care time)  Medications Ordered in ED Medications    pantoprazole (PROTONIX) injection 40 mg (has no administration in time range)  0.9 %  sodium chloride infusion (has no administration in time range)     Initial Impression / Assessment and Plan / ED Course  I have reviewed the triage vital signs and the nursing notes.  Pertinent labs & imaging results that were available during my care of the patient were reviewed by  me and considered in my medical decision making (see chart for details).     Brenda [name] is an 84 year old female history of hypertension, iron deficient anemia, high cholesterol, diabetes who presents to the ED with fall.  Patient with overall unremarkable vitals.  No fever.  Patient states that she got up to walk with her walker and tripped.  Patient did not lose consciousness. However, is on blood thinner. Patient with no shortness of breath, chest pain.  According to chart, she recently was started on iron for anemia.  Does have follow-up with GI.  Unaware of any melena or hematochezia.  Patient has abrasion to right shin and hematoma to the right side of her head but has no hip pain or chest pain on palpation.  Will obtain head and neck CT, x-rays of right lower leg, hip, chest, left hand.  Will obtain basic labs and EKG.  Patient had recent hemoglobin of 7.8 and was started on iron 10 days ago by primary care provider.  Evaluate for electrolyte abnormalities, anemia.  Extremity x-rays with no signs of acute fractures.  Patient does have right-sided forehead hematoma but otherwise no acute intracranial process on head CT.  Cervical CT unremarkable.  Lab work overall unremarkable, except for hemoglobin of 6.3.  This is significantly decreased from blood work done about a week and a half ago by family medicine.  Suspect GI bleed, pt of xarelto.  Pt given IV protonix and T/S sent and 1 unit of pRBCs ordered. Pt to be admitted to family medicine, HDS throughout my care.  Final Clinical Impressions(s) / ED Diagnoses   Final  diagnoses:  Traumatic hematoma of forehead, initial encounter  Gastrointestinal hemorrhage, unspecified gastrointestinal hemorrhage type    ED Discharge Orders    None       Lennice Sites, DO 07/17/17 [place], [place], DO 07/17/17 1947

## 2017-07-18 ENCOUNTER — Inpatient Hospital Stay (HOSPITAL_COMMUNITY): Payer: Medicare Other

## 2017-07-18 ENCOUNTER — Other Ambulatory Visit: Payer: Self-pay

## 2017-07-18 DIAGNOSIS — K922 Gastrointestinal hemorrhage, unspecified: Secondary | ICD-10-CM

## 2017-07-18 DIAGNOSIS — D649 Anemia, unspecified: Secondary | ICD-10-CM

## 2017-07-18 DIAGNOSIS — K219 Gastro-esophageal reflux disease without esophagitis: Secondary | ICD-10-CM

## 2017-07-18 DIAGNOSIS — I340 Nonrheumatic mitral (valve) insufficiency: Secondary | ICD-10-CM

## 2017-07-18 DIAGNOSIS — D509 Iron deficiency anemia, unspecified: Secondary | ICD-10-CM

## 2017-07-18 DIAGNOSIS — R1310 Dysphagia, unspecified: Secondary | ICD-10-CM

## 2017-07-18 DIAGNOSIS — K5900 Constipation, unspecified: Secondary | ICD-10-CM

## 2017-07-18 LAB — BASIC METABOLIC PANEL
Anion gap: 10 (ref 5–15)
BUN: 18 mg/dL (ref 6–20)
CO2: 21 mmol/L — ABNORMAL LOW (ref 22–32)
Calcium: 8.7 mg/dL — ABNORMAL LOW (ref 8.9–10.3)
Chloride: 105 mmol/L (ref 101–111)
Creatinine, Ser: 1.29 mg/dL — ABNORMAL HIGH (ref 0.44–1.00)
GFR calc Af Amer: 43 mL/min — ABNORMAL LOW (ref >60)
GFR calc non Af Amer: 37 mL/min — ABNORMAL LOW (ref >60)
Glucose, Bld: 126 mg/dL — ABNORMAL HIGH (ref 65–99)
Potassium: 3.6 mmol/L (ref 3.5–5.1)
Sodium: 136 mmol/L (ref 135–145)

## 2017-07-18 LAB — CBC
HCT: 24.6 % — ABNORMAL LOW (ref 36.0–46.0)
HCT: 30.7 % — ABNORMAL LOW (ref 36.0–46.0)
Hemoglobin: 7.7 g/dL — ABNORMAL LOW (ref 12.0–15.0)
Hemoglobin: 9.7 g/dL — ABNORMAL LOW (ref 12.0–15.0)
MCH: 25.8 pg — ABNORMAL LOW (ref 26.0–34.0)
MCH: 26 pg (ref 26.0–34.0)
MCHC: 31.3 g/dL (ref 30.0–36.0)
MCHC: 31.6 g/dL (ref 30.0–36.0)
MCV: 82.3 fL (ref 78.0–100.0)
MCV: 82.3 fL (ref 78.0–100.0)
Platelets: 239 10*3/uL (ref 150–400)
Platelets: 234 10*3/uL (ref 150–400)
RBC: 2.99 MIL/uL — ABNORMAL LOW (ref 3.87–5.11)
RBC: 3.73 MIL/uL — ABNORMAL LOW (ref 3.87–5.11)
RDW: 15.7 % — ABNORMAL HIGH (ref 11.5–15.5)
RDW: 15.7 % — ABNORMAL HIGH (ref 11.5–15.5)
WBC: 6.7 10*3/uL (ref 4.0–10.5)
WBC: 7.2 10*3/uL (ref 4.0–10.5)

## 2017-07-18 LAB — PREPARE RBC (CROSSMATCH)

## 2017-07-18 LAB — ECHOCARDIOGRAM LIMITED
Height: 61 in
Weight: 2176.3811 oz

## 2017-07-18 LAB — OCCULT BLOOD X 1 CARD TO LAB, STOOL: Fecal Occult Bld: NEGATIVE

## 2017-07-18 MED ORDER — POLYETHYLENE GLYCOL 3350 17 G PO PACK
17.0000 g | PACK | Freq: Once | ORAL | Status: AC
  Administered 2017-07-18: 17 g via ORAL
  Filled 2017-07-18: qty 1

## 2017-07-18 MED ORDER — PEG-KCL-NACL-NASULF-NA ASC-C 100 G PO SOLR
0.5000 | Freq: Once | ORAL | Status: AC
  Administered 2017-07-19: 100 g via ORAL

## 2017-07-18 MED ORDER — SODIUM CHLORIDE 0.9 % IV SOLN
Freq: Once | INTRAVENOUS | Status: AC
  Administered 2017-07-18: 11:00:00 via INTRAVENOUS

## 2017-07-18 MED ORDER — PEG-KCL-NACL-NASULF-NA ASC-C 100 G PO SOLR: 1.0000 | Freq: Once | ORAL | Status: DC

## 2017-07-18 MED ORDER — BISACODYL 5 MG PO TBEC
5.0000 mg | DELAYED_RELEASE_TABLET | Freq: Once | ORAL | Status: AC
  Administered 2017-07-18: 5 mg via ORAL
  Filled 2017-07-18: qty 1

## 2017-07-18 MED ORDER — POLYETHYLENE GLYCOL 3350 17 G PO PACK: 17.0000 g | PACK | Freq: Every day | ORAL | Status: DC | PRN

## 2017-07-18 MED ORDER — POLYETHYLENE GLYCOL 3350 17 G PO PACK
17.0000 g | PACK | Freq: Every day | ORAL | Status: DC
  Administered 2017-07-18: 17 g via ORAL
  Filled 2017-07-18: qty 1

## 2017-07-18 MED ORDER — PANTOPRAZOLE SODIUM 40 MG PO TBEC
40.0000 mg | DELAYED_RELEASE_TABLET | Freq: Every day | ORAL | Status: DC
  Administered 2017-07-18 – 2017-07-20 (×3): 40 mg via ORAL
  Filled 2017-07-18 (×3): qty 1

## 2017-07-18 MED ORDER — METOCLOPRAMIDE HCL 5 MG/ML IJ SOLN
5.0000 mg | Freq: Once | INTRAMUSCULAR | Status: AC
  Administered 2017-07-18: 5 mg via INTRAVENOUS
  Filled 2017-07-18: qty 2

## 2017-07-18 MED ORDER — PEG-KCL-NACL-NASULF-NA ASC-C 100 G PO SOLR
0.5000 | Freq: Once | ORAL | Status: AC
  Administered 2017-07-18: 100 g via ORAL
  Filled 2017-07-18: qty 1

## 2017-07-18 MED ORDER — PANTOPRAZOLE SODIUM 40 MG PO TBEC: 40.0000 mg | DELAYED_RELEASE_TABLET | Freq: Two times a day (BID) | ORAL | Status: DC

## 2017-07-18 MED ORDER — BISACODYL 5 MG PO TBEC
5.0000 mg | DELAYED_RELEASE_TABLET | Freq: Once | ORAL | Status: AC
  Administered 2017-07-18: 5 mg via ORAL
  Filled 2017-07-18 (×2): qty 1

## 2017-07-18 MED ORDER — METOCLOPRAMIDE HCL 5 MG/ML IJ SOLN
5.0000 mg | Freq: Once | INTRAMUSCULAR | Status: AC
  Administered 2017-07-19: 5 mg via INTRAVENOUS
  Filled 2017-07-18: qty 2

## 2017-07-18 NOTE — H&P (View-Only) (Signed)
[facility] Consult: 9:32 AM 07/18/2017  LOS: 1 day    Referring Provider: Dr Ardelia Mems  Primary Care Physician:  Sela Hilding, MD Primary Gastroenterologist:  Dr Havery Moros.      Reason for Consultation:  Anemia.     HPI: Brenda [name] is a 84 y.o. female.  Hx PAF, on Xarelto. S/p ICD for hx VT.  Htn.  Osteoporosis.  Facial basal cell skin cancer.  Anemia, previously took oral iron as a younger woman..  Stage 3 CKD.     On daily PPI. 05/23/2016 EGD for dysphagia.  5 cm HH.  Benign stenosis at GE junction.  This was dilated with 16-17- 18 mm balloon.  Biopsies were obtained at the stricture diverticulum in D2.  02/27/17 EGD for dysphagia. 5 cm hiatal hernia.  Benign looking esophageal stenosis at 27 cm from incisors was dilated with 18-19-20 mm balloons.  Diverticulum noted in D2.  Otherwise exam normal. Previous endoscopy 2015, also for dysphagia, along with more remote colonoscopy performed in [place]. Patient says she has relief of her solid and liquid dysphagia for 3 or 4 months following dilatation.  She takes Prilosec daily. She has constipation.  In the past she has treated this with laxatives but she is not taking laxatives any longer.  Generally has a bowel movement every 3 or 4 days but it can be a week between bowel movements.  At times she has to manually self disimpact herself.  She complains of discomfort in her lower abdomen, this is worse at night but it does not interrupt her ability to sleep.  Several months ago, within the last year, she had episode of hematochezia but it quickly resolved.  She has not seen any dark stools or blood per rectum.  No nausea or vomiting.  Her solid and liquid food dysphagia has returned.  She has a globus sensation.  At times she has trouble handling her  oral secretions.  In late January 2019 she was complaining of fatigue.  Her Hgb was 7.9, compared with 11.1 in May 2018. On 05/05/17 Iron was 16, ferritin 13 .  PMD note from 05/06/16 office visit mentions starting oral iron but the patient has not started this yet.  Presented to [facility] ED after witnessed fall at home.  No clear cause for the fall and no clear syncopal episode, she denied prodrome of dyspnea, dizziness, palpitations.  She recalls that she got up to go somewhere within the house and fell over she hit her head her hand and her leg.  She has had falls at home earlier this year as well. CT negative for acute trauma or bleed. Hgb 6.3, received 1 U PRBC and now 7.7.  MCV 81. Stable GFR/renal function. No coags.  Last took Rivaroxiban on 4/21.  No NSAIDs, ASA, ETOH.   No fm hx of colon cancer, GIB, ulcers.      Past Medical History:  Diagnosis Date  . Anemia   . Arthritis   . Basal cell carcinoma    nose  . Diabetes mellitus without complication ([place])  pt denies diabetes  . GERD (gastroesophageal reflux disease)   . Hiatal hernia   . HLD (hyperlipidemia)   . Hypertension   . Seizure ([place]) 2011    Past Surgical History:  Procedure Laterality Date  . CARDIAC DEFIBRILLATOR PLACEMENT    . CATARACT EXTRACTION Left 02/2001  . CHOLECYSTECTOMY    . LUMBAR LAMINECTOMY  06/11/2001  . LUMBAR LAMINECTOMY  02/09/2006  . LUMBAR LAMINECTOMY  07/07/2006   with spianal cord stimulator  . SPINAL CORD STIMULATOR REMOVAL  14/2009  . TUBAL LIGATION      Prior to Admission medications   Medication Sig Start Date End Date Taking? Authorizing Provider  atorvastatin (LIPITOR) 40 MG tablet Take 1 tablet (40 mg total) by mouth daily. 06/17/16  Yes Sela Hilding, MD  Cholecalciferol (VITAMIN D-3) 1000 units CAPS Take 1,000 Units by mouth daily.   Yes [provider]  metoprolol tartrate (LOPRESSOR) 25 MG tablet Take 1 tablet (25 mg total) by mouth 2 (two) times daily. 05/03/17  08/01/17 Yes Camnitz, Will Hassell Done, MD  Multiple Vitamins-Minerals (ICAPS AREDS 2 PO) Take 1 capsule by mouth 2 (two) times daily.    Yes [provider]  omeprazole (PRILOSEC) 40 MG capsule Take 1 capsule (40 mg total) by mouth daily. 05/20/16  Yes Armbruster, Carlota Raspberry, MD  Propylene Glycol (SYSTANE COMPLETE) 0.6 % SOLN Place 1 drop into both eyes daily.   Yes [provider]  Rivaroxaban (XARELTO) 15 MG TABS tablet Take 1 tablet (15 mg total) by mouth daily with supper. 06/08/17  Yes Camnitz, Ocie Doyne, MD  tolterodine (DETROL LA) 2 MG 24 hr capsule Take 1 capsule (2 mg total) by mouth daily. 05/05/17  Yes Sela Hilding, MD  verapamil (CALAN-SR) 240 MG CR tablet Take 1 tablet (240 mg total) by mouth at bedtime. 06/22/17  Yes Camnitz, Will Hassell Done, MD  Iron, Ferrous Sulfate, 142 (45 Fe) MG TBCR Take 1 tablet by mouth every other day. 07/05/17   Sela Hilding, MD    Scheduled Meds: . atorvastatin  40 mg Oral Daily  . ferrous sulfate  325 mg Oral QODAY  . metoprolol tartrate  25 mg Oral BID  . pantoprazole  40 mg Oral BID  . verapamil  240 mg Oral QHS   Infusions:  PRN Meds: acetaminophen **OR** acetaminophen, polyethylene glycol   Allergies as of 07/17/2017 - Review Complete 07/17/2017  Allergen Reaction Noted  . Sotalol Other (See Comments) 03/26/2016  . Erythromycin Nausea Only 03/26/2016  . Augmentin [amoxicillin-pot clavulanate] Rash 03/26/2016  . Crestor [rosuvastatin calcium] Rash 03/26/2016  . Keflex [cephalexin] Rash 03/26/2016  . Septra [sulfamethoxazole-trimethoprim] Rash 03/26/2016    Family History  Problem Relation Age of Onset  . Diabetes Mother   . Heart failure Father   . Breast cancer Sister   . Heart failure Sister   . Diabetes Son   . Diabetes Sister   . Colon cancer Neg Hx   . Stomach cancer Neg Hx   . Rectal cancer Neg Hx   . Esophageal cancer Neg Hx     Social History   Socioeconomic History  . Marital status: Married     Spouse name: Not on file  . Number of children: 2  . Years of education: Not on file  . Highest education level: Not on file  Occupational History  . Occupation: retired  Scientific laboratory technician  . Financial resource strain: Not on file  . Food insecurity:    Worry: Not on file  Inability: Not on file  . Transportation needs:    Medical: Not on file    Non-medical: Not on file  Tobacco Use  . Smoking status: Never Smoker  . Smokeless tobacco: Never Used  Substance and Sexual Activity  . Alcohol use: No  . Drug use: No  . Sexual activity: Not on file  Lifestyle  . Physical activity:    Days per week: Not on file    Minutes per session: Not on file  . Stress: Not on file  Relationships  . Social connections:    Talks on phone: Not on file    Gets together: Not on file    Attends religious service: Not on file    Active member of club or organization: Not on file    Attends meetings of clubs or organizations: Not on file    Relationship status: Not on file  . Intimate partner violence:    Fear of current or ex partner: Not on file    Emotionally abused: Not on file    Physically abused: Not on file    Forced sexual activity: Not on file  Other Topics Concern  . Not on file  Social History Narrative  . Not on file    REVIEW OF SYSTEMS: Constitutional: Fatigue, weakness. ENT:  No nose bleeds.  Planes of dry mouth which makes it difficult to talk. Pulm: Cough, sometimes productive of clear sputum. CV:  No palpitations, no chest pain.  Intermittent swelling in her ankles..  GU:  No hematuria, no frequency.  Blood in the urine. GI:  Per HPI.   Heme: No unusual or excessive bleeding/bruising. Transfusions: Last night, none before that. Neuro:  No headaches, no peripheral tingling or numbness Derm:  No itching, no rash or sores.  Endocrine:  No sweats or chills.  No polyuria or dysuria Immunization: Current on her flu shot. Travel:  None beyond local counties in last few  months.    PHYSICAL EXAM: Vital signs in last 24 hours: Vitals:   07/18/17 0622 07/18/17 0758  BP: (!) 103/43 100/82  Pulse: 88 69  Resp:  18  Temp: 98.2 F (36.8 C) 98.2 F (36.8 C)  SpO2:  98%   Wt Readings from Last 3 Encounters:  07/18/17 136 lb 0.4 oz (61.7 kg)  07/04/17 135 lb 6.4 oz (61.4 kg)  05/05/17 137 lb 3.2 oz (62.2 kg)    General: Pleasant, somewhat frail and pale elderly WF. Head: Bruising and slight abrasions around the left eye forehead and zygoma.  No facial asymmetry. Eyes: No scleral icterus.  Conjunctiva pale. Ears: Slightly HOH. Nose: No discharge or congestion. Mouth: Oral mucosa is clear, dry.  Tongue is midline. Neck: No JVD, no masses, no thyromegaly. Lungs: Clear bilaterally.  No labored breathing.  Periodic coughing which seems like she is trying to cough up oral secretions. Heart: RRR.  No MRG.  S1, S2 present.  ICD apparatus positioned in the left upper chest Abdomen: Soft.  Mildly tender in the lower abdomen.  No masses, no HSM, no bruits, no hernias..  Rectal: Deferred Musc/Skeltl: No joint redness, swelling or significant deformity. Extremities: No CCE. Neurologic: Fully alert.  Oriented x3.  Good historian.  Moves all 4 limbs without tremor.  Strength not tested.  No gross neurologic deficits. Skin: No rashes, no sores, no telangiectasia, no tattoos. Nodes: No cervical adenopathy. Psych: Pleasant, calm, cooperative.  Intake/Output from previous day: 04/22 0701 - 04/23 0700 In: 455 [P.O.:120; Blood:335] Out: 0  Intake/Output this shift: Total I/O In: 360 [P.O.:360] Out: -   LAB RESULTS: Recent Labs    07/17/17 1700 07/18/17 0241  WBC 8.3 6.7  HGB 6.3* 7.7*  HCT 20.6* 24.6*  PLT 289 239   BMET Lab Results  Component Value Date   NA 136 07/18/2017   NA 133 (L) 07/17/2017   NA 136 04/20/2017   K 3.6 07/18/2017   K 3.8 07/17/2017   K 4.7 04/20/2017   CL 105 07/18/2017   CL 103 07/17/2017   CL 102 04/20/2017   CO2 21 (L)  07/18/2017   CO2 19 (L) 07/17/2017   CO2 20 04/20/2017   GLUCOSE 126 (H) 07/18/2017   GLUCOSE 159 (H) 07/17/2017   GLUCOSE 150 (H) 04/20/2017   BUN 18 07/18/2017   BUN 22 (H) 07/17/2017   BUN 29 (H) 04/20/2017   CREATININE 1.29 (H) 07/18/2017   CREATININE 1.50 (H) 07/17/2017   CREATININE 1.58 (H) 04/20/2017   CALCIUM 8.7 (L) 07/18/2017   CALCIUM 8.6 (L) 07/17/2017   CALCIUM 10.6 (H) 04/20/2017   LFT No results for input(s): PROT, ALBUMIN, AST, ALT, ALKPHOS, BILITOT, BILIDIR, IBILI in the last 72 hours. PT/INR No results found for: INR, PROTIME Hepatitis Panel No results for input(s): HEPBSAG, HCVAB, HEPAIGM, HEPBIGM in the last 72 hours. C-Diff No components found for: CDIFF Lipase  No results found for: LIPASE  Drugs of Abuse  No results found for: LABOPIA, COCAINSCRNUR, LABBENZ, AMPHETMU, THCU, LABBARB   RADIOLOGY STUDIES: Dg Chest 2 View  Result Date: 07/17/2017 CLINICAL DATA:  Chest pain after fall. EXAM: CHEST - 2 VIEW COMPARISON:  Chest x-ray dated Jul 30, 2016. FINDINGS: Unchanged left chest wall AICD. The heart remains at the upper limits of normal in size. Normal pulmonary vascularity. No focal consolidation, pleural effusion, or pneumothorax. Unchanged moderate hiatal hernia. No acute osseous abnormality. IMPRESSION: No active cardiopulmonary disease. Electronically Signed   By: Titus Dubin M.D.   On: 07/17/2017 15:59   Dg Pelvis 1-2 Views  Result Date: 07/17/2017 CLINICAL DATA:  Fall. EXAM: PELVIS - 1-2 VIEW COMPARISON:  None. FINDINGS: There is no evidence of acute pelvic fracture or diastasis. Old healed fracture of the left inferior pubic ramus. No pelvic bone lesions are seen. Extensive lumbar spinal fusion hardware. Osteopenia. IMPRESSION: 1.  No acute osseous abnormality. Electronically Signed   By: Titus Dubin M.D.   On: 07/17/2017 15:58   Dg Knee 2 Views Right  Result Date: 07/17/2017 CLINICAL DATA:  Status post fall.  Distal right lower leg pain.  EXAM: RIGHT KNEE - 1-2 VIEW COMPARISON:  None. FINDINGS: No evidence of fracture, dislocation, or joint effusion. No evidence of arthropathy or other focal bone abnormality. Vascular calcifications noted. IMPRESSION: 1. No acute findings. Electronically Signed   By: Kerby Moors M.D.   On: 07/17/2017 15:58   Dg Tibia/fibula Right  Result Date: 07/17/2017 CLINICAL DATA:  Fall with right leg pain.  Initial encounter. EXAM: RIGHT TIBIA AND FIBULA - 2 VIEW COMPARISON:  None. FINDINGS: Negative for acute fracture or malalignment. Osteopenia.  Vascular calcification. IMPRESSION: No acute finding. Electronically Signed   By: Monte Fantasia M.D.   On: 07/17/2017 16:08   Ct Head Wo Contrast  Result Date: 07/17/2017 CLINICAL DATA:  C-spine trauma, tripped and fell, patient on blood thinners. EXAM: CT HEAD WITHOUT CONTRAST CT CERVICAL SPINE WITHOUT CONTRAST TECHNIQUE: Multidetector CT imaging of the head and cervical spine was performed following the standard protocol without intravenous contrast. Multiplanar CT  image reconstructions of the cervical spine were also generated. COMPARISON:  CT head 07/30/2016. FINDINGS: CT HEAD FINDINGS Brain: No evidence for acute infarction, hemorrhage, mass lesion, or extra-axial fluid. Advanced atrophy. Hydrocephalus ex vacuo. Extensive hypoattenuation of white matter, likely small vessel disease. Vascular: Calcification of the cavernous internal carotid arteries consistent with cerebrovascular atherosclerotic disease. No signs of intracranial large vessel occlusion. Skull: No skull fracture.  RIGHT frontal soft tissue swelling. Sinuses/Orbits: No layering sinus fluid. BILATERAL cataract extraction. Other: None. CT CERVICAL SPINE FINDINGS Alignment: Exaggerated lordosis.  No traumatic subluxation. Skull base and vertebrae: No acute fracture. No primary bone lesion or focal pathologic process. BILATERAL facet arthropathy. Osseous spurring. Soft tissues and spinal canal:  Unremarkable. Disc levels: Advanced disc space narrowing C5-6 and C6-7. Vacuum phenomenon in the foramen C6-7 on the LEFT. Upper chest: Lung apices clear. Other: None. IMPRESSION: Advanced atrophy. No skull fracture or intracranial hemorrhage. RIGHT frontal scalp hematoma. Cervical spondylosis most pronounced C5-6 and C6-7. No cervical spine fracture or traumatic subluxation. Electronically Signed   By: Staci Righter M.D.   On: 07/17/2017 16:32   Ct Cervical Spine Wo Contrast  Result Date: 07/17/2017 CLINICAL DATA:  C-spine trauma, tripped and fell, patient on blood thinners. EXAM: CT HEAD WITHOUT CONTRAST CT CERVICAL SPINE WITHOUT CONTRAST TECHNIQUE: Multidetector CT imaging of the head and cervical spine was performed following the standard protocol without intravenous contrast. Multiplanar CT image reconstructions of the cervical spine were also generated. COMPARISON:  CT head 07/30/2016. FINDINGS: CT HEAD FINDINGS Brain: No evidence for acute infarction, hemorrhage, mass lesion, or extra-axial fluid. Advanced atrophy. Hydrocephalus ex vacuo. Extensive hypoattenuation of white matter, likely small vessel disease. Vascular: Calcification of the cavernous internal carotid arteries consistent with cerebrovascular atherosclerotic disease. No signs of intracranial large vessel occlusion. Skull: No skull fracture.  RIGHT frontal soft tissue swelling. Sinuses/Orbits: No layering sinus fluid. BILATERAL cataract extraction. Other: None. CT CERVICAL SPINE FINDINGS Alignment: Exaggerated lordosis.  No traumatic subluxation. Skull base and vertebrae: No acute fracture. No primary bone lesion or focal pathologic process. BILATERAL facet arthropathy. Osseous spurring. Soft tissues and spinal canal: Unremarkable. Disc levels: Advanced disc space narrowing C5-6 and C6-7. Vacuum phenomenon in the foramen C6-7 on the LEFT. Upper chest: Lung apices clear. Other: None. IMPRESSION: Advanced atrophy. No skull fracture or  intracranial hemorrhage. RIGHT frontal scalp hematoma. Cervical spondylosis most pronounced C5-6 and C6-7. No cervical spine fracture or traumatic subluxation. Electronically Signed   By: Staci Righter M.D.   On: 07/17/2017 16:32   Dg Hand Complete Left  Result Date: 07/17/2017 CLINICAL DATA:  Status post fall with pain. EXAM: LEFT HAND - COMPLETE 3+ VIEW COMPARISON:  None. FINDINGS: There is no evidence of fracture or dislocation. 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 IMPRESSION: No acute fracture or dislocation identified about the left hand. Osteoarthritic changes. Electronically Signed   By: Fidela Salisbury M.D.   On: 07/17/2017 15:59     IMPRESSION:   *  Acute on chronic anemia.  IDA by labs in 04/2017.  Had not started oral iron at home.  Po iron initiated since arrival. Hgb improved after 1 U PRBC.  1 more unit ordered for today. Low suspicion of acute GI bleed.  Suspect this is worsening of chronic, iron deficiency anemia.  Given no ulcer disease or gastritis on EGD 2 months ago and daily Prilosec, low suspicion for ulcer/mucosal disease.  *   Fall with bruising but no head  trauma.  Not clear that she had syncope.  *   Dysphagia.  S/p dilations of esophageal stricture, last was 04/2016.  Difficulty swallowing has recurred.  Suspected motility, though has not had esophagram to verify this..  *   Constipation.  Suspect Verapamil is playing signif role in this.    *    S/p ICD placement for history of V. tach.  The defibrillator did not discharge yesterday.    PLAN:     *  Colonoscopy and upper endoscopy.  Set this up for tomorrow.  If prep incomplete due to severe constipation can postpone until 4/25, but aim for 4/24.   Risks and benefits discussed with patient and she is agreeable to proceed. Plan to proceed slowly with prep given her dysphagia.  *   Stop oral iron for the time being.  If we proceed with any sort of  endoscopic/colonoscopic procedures the iron is going to make for confusion as to whether there is blood present. Might benefit from receiving Feraheme which Pharm D can dose, sway he might be able to limit 3 oral iron.  *    Although it is not likely, wonder if the verapamil could be discontinued to see if it helps her constipation.  *   She does not need CBCs drawn every 4 hours, I changed lab order timing around.   Azucena Freed  07/18/2017, 9:32 AM Phone [phone]

## 2017-07-18 NOTE — Progress Notes (Signed)
Family Medicine Teaching Service Daily Progress Note Intern Pager: [phone]  Patient name: Brenda [name] Medical record number: [id number] Date of birth: February 18, 1932 Age: 84 y.o. Gender: female  Primary Care Provider: Sela Hilding, MD Consultants: GI Code Status: DNR/DNI confirmed on admission with family at bedside  Pt Overview and Major Events to Date:  Brenda [name] is a 84 y.o. female presenting with fall, found to have symptomatic anemia . PMH is significant for T2DM, Paroxysmal Afib on xarelto (held), AICD in place, T2DM, HTN  Assessment and Plan:  Fall w/multiple abrasions- Patient does not remember falling. 2 prior falls this year per family at bedside. Does endorse feeling like the room is spinning. Unsure if it is due to the anemia. No evidence of nystagmus on exam. CT head/cervical spine negative for fx. Other xrays negative for fx. - monitor on telemetry - check cardiac echo - monitor neuro checks q4H - orthostatics - up with assistance - PT/OT  Acute on chronic anemia - Improved. Hgb 6.3 > 7.7 sp 1u pRBC. BL ~7-8. Concern for GI bleed on IDA. Pt endorses dark stools this AM on 4/23. Recent outpt anemia panel c/w iron deficiency anemia, notable for Iron low at 16, TIBC 331, Iron sat low at 5%, folate WNL.  Pt denies taking iron supplaments at home, but they are on her home med list. Possible the cause of stools being dark.  - transfuse 1u PRBC ordered in ED - trend hg - FOBT pending - Protonix BID 40 mg - GI consulted, appreciate recommendations - hold home xarelto for now, SCDs for anticoagulation - continue ferrous sulfate 325 mg qOD  Paroxysmal afib - NSR on admission EKG, and rate controlled. Home meds include: lopressor 25 mg BID and Xarelto. - hold xarelto as noted above in setting of possible bleed/anemia - continue home lopressor for rate control  HTN Normotensive.  - cont Home medication Verapamil 240 mg QHS reordered.  Prediabetes - A1c  5.7 on 04/2016 test. Monitor glucose on AM BMP.  Hx TIA  Patient denies hx of stroke, family at bedside endorses her hx of "mini stroke" without residual deficits. Home medication includes atorvastatin 40 mg daily. Not on ASA. - continue home statin  AICD present  - noted.  Prolonged QTC - 493 on admission. Avoid QTc prolonging medications.  Constipation Patient reports regular constipation with multiple hard balls of stool with some fecal incontinence.  - miralax prn  FEN/GI: HH diet Prophylaxis: SCDs, holding anticoagulation in setting of ?bleed/anemia   Disposition: inpatient for work up of anemia and falls  Subjective:  Patient feels that she is doing better. Says that she feels like room is spinning on her. Endorses mild HA. Denies SOB, CP.   Objective: Temp:  [97.8 F (36.6 C)-98.5 F (36.9 C)] 98.2 F (36.8 C) (04/23 0758) Pulse Rate:  [69-118] 69 (04/23 0758) Resp:  [14-26] 18 (04/23 0758) BP: (100-163)/(43-113) 100/82 (04/23 0758) SpO2:  [92 %-100 %] 98 % (04/23 0758) Weight:  [136 lb 0.4 oz (61.7 kg)] 136 lb 0.4 oz (61.7 kg) (04/23 0104) Physical Exam: Gen: NAD, resting comfortably CV: RRR with no murmurs appreciated Pulm: NWOB, CTAB  GI: Normal bowel sounds present. Soft, Nontender, Nondistended. Skin: multiple abbrations on head and right arm Neuro: grossly normal, moves all extremities, no nystagmus  Laboratory: Recent Labs  Lab 07/17/17 1700 07/18/17 0241  WBC 8.3 6.7  HGB 6.3* 7.7*  HCT 20.6* 24.6*  PLT 289 239   Recent Labs  Lab 07/17/17 1700  07/18/17 0241  NA 133* 136  K 3.8 3.6  CL 103 105  CO2 19* 21*  BUN 22* 18  CREATININE 1.50* 1.29*  CALCIUM 8.6* 8.7*  GLUCOSE 159* 126*      Imaging/Diagnostic Tests: Dg Chest 2 View  Result Date: 07/17/2017 CLINICAL DATA:  Chest pain after fall. EXAM: CHEST - 2 VIEW COMPARISON:  Chest x-ray dated Jul 30, 2016. FINDINGS: Unchanged left chest wall AICD. The heart remains at the upper limits  of normal in size. Normal pulmonary vascularity. No focal consolidation, pleural effusion, or pneumothorax. Unchanged moderate hiatal hernia. No acute osseous abnormality. IMPRESSION: No active cardiopulmonary disease. Electronically Signed   By: Titus Dubin M.D.   On: 07/17/2017 15:59   Dg Pelvis 1-2 Views  Result Date: 07/17/2017 CLINICAL DATA:  Fall. EXAM: PELVIS - 1-2 VIEW COMPARISON:  None. FINDINGS: There is no evidence of acute pelvic fracture or diastasis. Old healed fracture of the left inferior pubic ramus. No pelvic bone lesions are seen. Extensive lumbar spinal fusion hardware. Osteopenia. IMPRESSION: 1.  No acute osseous abnormality. Electronically Signed   By: Titus Dubin M.D.   On: 07/17/2017 15:58   Dg Knee 2 Views Right  Result Date: 07/17/2017 CLINICAL DATA:  Status post fall.  Distal right lower leg pain. EXAM: RIGHT KNEE - 1-2 VIEW COMPARISON:  None. FINDINGS: No evidence of fracture, dislocation, or joint effusion. No evidence of arthropathy or other focal bone abnormality. Vascular calcifications noted. IMPRESSION: 1. No acute findings. Electronically Signed   By: Kerby Moors M.D.   On: 07/17/2017 15:58   Dg Tibia/fibula Right  Result Date: 07/17/2017 CLINICAL DATA:  Fall with right leg pain.  Initial encounter. EXAM: RIGHT TIBIA AND FIBULA - 2 VIEW COMPARISON:  None. FINDINGS: Negative for acute fracture or malalignment. Osteopenia.  Vascular calcification. IMPRESSION: No acute finding. Electronically Signed   By: Monte Fantasia M.D.   On: 07/17/2017 16:08   Ct Head Wo Contrast  Result Date: 07/17/2017 CLINICAL DATA:  C-spine trauma, tripped and fell, patient on blood thinners. EXAM: CT HEAD WITHOUT CONTRAST CT CERVICAL SPINE WITHOUT CONTRAST TECHNIQUE: Multidetector CT imaging of the head and cervical spine was performed following the standard protocol without intravenous contrast. Multiplanar CT image reconstructions of the cervical spine were also generated.  COMPARISON:  CT head 07/30/2016. FINDINGS: CT HEAD FINDINGS Brain: No evidence for acute infarction, hemorrhage, mass lesion, or extra-axial fluid. Advanced atrophy. Hydrocephalus ex vacuo. Extensive hypoattenuation of white matter, likely small vessel disease. Vascular: Calcification of the cavernous internal carotid arteries consistent with cerebrovascular atherosclerotic disease. No signs of intracranial large vessel occlusion. Skull: No skull fracture.  RIGHT frontal soft tissue swelling. Sinuses/Orbits: No layering sinus fluid. BILATERAL cataract extraction. Other: None. CT CERVICAL SPINE FINDINGS Alignment: Exaggerated lordosis.  No traumatic subluxation. Skull base and vertebrae: No acute fracture. No primary bone lesion or focal pathologic process. BILATERAL facet arthropathy. Osseous spurring. Soft tissues and spinal canal: Unremarkable. Disc levels: Advanced disc space narrowing C5-6 and C6-7. Vacuum phenomenon in the foramen C6-7 on the LEFT. Upper chest: Lung apices clear. Other: None. IMPRESSION: Advanced atrophy. No skull fracture or intracranial hemorrhage. RIGHT frontal scalp hematoma. Cervical spondylosis most pronounced C5-6 and C6-7. No cervical spine fracture or traumatic subluxation. Electronically Signed   By: Staci Righter M.D.   On: 07/17/2017 16:32   Ct Cervical Spine Wo Contrast  Result Date: 07/17/2017 CLINICAL DATA:  C-spine trauma, tripped and fell, patient on blood thinners. EXAM: CT HEAD WITHOUT CONTRAST  CT CERVICAL SPINE WITHOUT CONTRAST TECHNIQUE: Multidetector CT imaging of the head and cervical spine was performed following the standard protocol without intravenous contrast. Multiplanar CT image reconstructions of the cervical spine were also generated. COMPARISON:  CT head 07/30/2016. FINDINGS: CT HEAD FINDINGS Brain: No evidence for acute infarction, hemorrhage, mass lesion, or extra-axial fluid. Advanced atrophy. Hydrocephalus ex vacuo. Extensive hypoattenuation of white  matter, likely small vessel disease. Vascular: Calcification of the cavernous internal carotid arteries consistent with cerebrovascular atherosclerotic disease. No signs of intracranial large vessel occlusion. Skull: No skull fracture.  RIGHT frontal soft tissue swelling. Sinuses/Orbits: No layering sinus fluid. BILATERAL cataract extraction. Other: None. CT CERVICAL SPINE FINDINGS Alignment: Exaggerated lordosis.  No traumatic subluxation. Skull base and vertebrae: No acute fracture. No primary bone lesion or focal pathologic process. BILATERAL facet arthropathy. Osseous spurring. Soft tissues and spinal canal: Unremarkable. Disc levels: Advanced disc space narrowing C5-6 and C6-7. Vacuum phenomenon in the foramen C6-7 on the LEFT. Upper chest: Lung apices clear. Other: None. IMPRESSION: Advanced atrophy. No skull fracture or intracranial hemorrhage. RIGHT frontal scalp hematoma. Cervical spondylosis most pronounced C5-6 and C6-7. No cervical spine fracture or traumatic subluxation. Electronically Signed   By: Staci Righter M.D.   On: 07/17/2017 16:32   Dg Hand Complete Left  Result Date: 07/17/2017 CLINICAL DATA:  Status post fall with pain. EXAM: LEFT HAND - COMPLETE 3+ VIEW COMPARISON:  None. FINDINGS: There is no evidence of fracture or dislocation. 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 IMPRESSION: No acute fracture or dislocation identified about the left hand. Osteoarthritic changes. Electronically Signed   By: Fidela Salisbury M.D.   On: 07/17/2017 15:59    Bonnita Hollow, MD 07/18/2017, 8:58 AM PGY-1, [place] Intern pager: [phone], text pages welcome

## 2017-07-18 NOTE — Evaluation (Signed)
Occupational Therapy Evaluation Patient Details Name: Brenda [name] MRN: [id number] DOB: 11/24/1931 Today's Date: 07/18/2017    History of Present Illness 84 yo female admitted s/p fall with anemia    Clinical Impression   PT admitted with anemia. Pt currently with functional limitiations due to the deficits listed below (see OT problem list). Pt currently with c/o headache and R eye blurred vision. Pt with orthostatic BP. See vitals. Pt with pending unit of blood and RN starting second unit.  Pt will benefit from skilled OT to increase their independence and safety with adls and balance to allow discharge [employer] pending progress. Pt will have have supervision level care upond d/c Pt will have to be mod I . Pt was able to do simple meal prep prior to admission. If patient does not progress to this level of care will need to consider SNF.     Follow Up Recommendations  Home health OT(pending progress)    Equipment Recommendations  None recommended by OT    Recommendations for Other Services       Precautions / Restrictions Precautions Precautions: Fall Precaution Comments: orthostatic       Mobility Bed Mobility                  Transfers Overall transfer level: Needs assistance Equipment used: Rolling walker (2 wheeled) Transfers: Sit to/from Stand Sit to Stand: Min assist         General transfer comment: incr time and effort to power up . pt with L lateral lean in static standing    Balance Overall balance assessment: Needs assistance Sitting-balance support: Bilateral upper extremity supported;Feet supported Sitting balance-Leahy Scale: Fair Sitting balance - Comments: posterior lean   Standing balance support: Bilateral upper extremity supported Standing balance-Leahy Scale: Poor Standing balance comment: reliance on bil UE on RW                           ADL either performed or assessed with clinical judgement   ADL Overall ADL's :  Needs assistance/impaired Eating/Feeding: Independent   Grooming: Independent   Upper Body Bathing: Minimal assistance   Lower Body Bathing: Moderate assistance           Toilet Transfer: Minimal assistance Toilet Transfer Details (indicate cue type and reason): pt leaning to the Righ twith static stanidng           General ADL Comments: pt with orthostatic BP so session limited. Pt s/p unit of blood and pending second unit of blood     Vision Baseline Vision/History: Wears glasses Wears Glasses: At all times Vision Assessment?: Yes Eye Alignment: Within Functional Limits Ocular Range of Motion: Within Functional Limits Tracking/Visual Pursuits: Able to track stimulus in all quads without difficulty Additional Comments: pt reports hx of dry eyes and now with visual "blurry" Right eye. pt noted to have edema and bruising on R side of face     Perception     Praxis      Pertinent Vitals/Pain Pain Assessment: Faces Pain Score: 8  Faces Pain Scale: Hurts whole lot Pain Location: head Pain Descriptors / Indicators: Headache;Constant;Dull Pain Intervention(s): Monitored during session;Premedicated before session;Repositioned     Hand Dominance Right   Extremity/Trunk Assessment Upper Extremity Assessment Upper Extremity Assessment: Generalized weakness(AROM 80 degrees due to kyphotic posture)   Lower Extremity Assessment Lower Extremity Assessment: Generalized weakness;Defer to PT evaluation   Cervical / Trunk Assessment Cervical / Trunk Assessment: Kyphotic  Communication Communication Communication: HOH   Cognition Arousal/Alertness: Awake/alert Behavior During Therapy: WFL for tasks assessed/performed Overall Cognitive Status: Impaired/Different from baseline Area of Impairment: Safety/judgement;Awareness;Memory                     Memory: Decreased short-term memory   Safety/Judgement: Decreased awareness of deficits Awareness: Emergent    General Comments: pt with changes visualized by therapist but unreported by patient. Pt does not alert you to changes and therapist must rely on visual attention to the pateint at all times   General Comments  bruising to Right eye area. handout provided to family regarding concussion    Exercises     Shoulder Instructions      Home Living Family/patient expects to be discharged to:: Private residence Living Arrangements: Spouse/significant other;Children(daughter) Available Help at Discharge: Family;Available 24 hours/day(daughter works full time / spouse can call 911 if needed) Type of Home: House Home Access: Stairs to enter [employer] of Steps: 1 Entrance Stairs-Rails: None Home Layout: Multi-level;Able to live on main level with bedroom/bathroom     Bathroom Shower/Tub: Occupational psychologist: Standard     Home Equipment: Environmental consultant - 2 wheels;Shower seat   Additional Comments: daughter cares for patient and spouse but works full time. Cat lives in the house but tends to stay on the second levle of the home      Prior Functioning/Environment Level of Independence: Needs assistance  Gait / Transfers Assistance Needed: walker ADL's / Homemaking Assistance Needed: normally cooks breakfast and completes showers alone Communication / Swallowing Assistance Needed: december 2018 with new difficulty swallowing- pt with pending follow up after procedure regarding swallowing          OT Problem List: Decreased activity tolerance;Decreased cognition;Decreased coordination;Decreased knowledge of precautions;Decreased knowledge of use of DME or AE;Decreased safety awareness;Decreased strength;Impaired balance (sitting and/or standing);Impaired vision/perception;Pain      OT Treatment/Interventions: Self-care/ADL training;Therapeutic activities;Therapeutic exercise;Energy conservation;DME and/or AE instruction;Visual/perceptual  remediation/compensation;Patient/family education;Balance training    OT Goals(Current goals can be found in the care plan section) Acute Rehab OT Goals Patient Stated Goal: none stated OT Goal Formulation: With patient Time For Goal Achievement: 08/01/17 Potential to Achieve Goals: Good  OT Frequency: Min 2X/week   Barriers to D/C: Decreased caregiver support  spouse present but unable to phsycially (A) . daughter works full time so limited on time availabe to hlep patient       Co-evaluation              AM-PAC PT "6 Clicks" Daily Activity     Outcome Measure Help from another person eating meals?: None Help from another person taking care of personal grooming?: None Help from another person toileting, which includes using toliet, bedpan, or urinal?: A Little Help from another person bathing (including washing, rinsing, drying)?: A Little Help from another person to put on and taking off regular upper body clothing?: A Little Help from another person to put on and taking off regular lower body clothing?: A Lot 6 Click Score: 19   End of Session Equipment Utilized During Treatment: Gait belt;Rolling walker Nurse Communication: Mobility status;Precautions  Activity Tolerance: Patient tolerated treatment well Patient left: in bed;with call bell/phone within reach;with family/visitor present;with nursing/sitter in room  OT Visit Diagnosis: Unsteadiness on feet (R26.81);History of falling (Z91.81)                Time: 3244-0102 OT Time Calculation (min): 37 min Charges:  OT General Charges $OT  Visit: 1 Visit OT Evaluation $OT Eval Moderate Complexity: 1 Mod G-Codes:      Jeri Modena   OTR/L Pager: [phone] Office: [phone] .   Parke Poisson B 07/18/2017, 11:48 AM

## 2017-07-18 NOTE — Plan of Care (Signed)
Discussed with patient and family plan of care for the evening, pain management and routine for using the bathroom with some teach back displayed

## 2017-07-18 NOTE — Progress Notes (Signed)
SLP Cancellation Note  Patient Details Name: Cande Mastropietro MRN: [id number] DOB: 11/23/1931   Cancelled treatment:      Patient is limited to clear liquids at this time due to colonoscopy ordered in the morning. Hold Swallow evaluation until after colonoscopy (07/19/17 at 09:00).  Pt self reports food getting stuck in throat sometimes.      Charlynne Cousins Donyelle Enyeart 07/18/2017, 3:08 PM

## 2017-07-18 NOTE — Consult Note (Signed)
[facility] Consult: 9:32 AM 07/18/2017  LOS: 1 day    Referring Provider: Dr Ardelia Mems  Primary Care Physician:  Sela Hilding, MD Primary Gastroenterologist:  Dr Havery Moros.      Reason for Consultation:  Anemia.     HPI: Brenda [name] is a 84 y.o. female.  Hx PAF, on Xarelto. S/p ICD for hx VT.  Htn.  Osteoporosis.  Facial basal cell skin cancer.  Anemia, previously took oral iron as a younger woman..  Stage 3 CKD.     On daily PPI. 05/23/2016 EGD for dysphagia.  5 cm HH.  Benign stenosis at GE junction.  This was dilated with 16-17- 18 mm balloon.  Biopsies were obtained at the stricture diverticulum in D2.  02/27/17 EGD for dysphagia. 5 cm hiatal hernia.  Benign looking esophageal stenosis at 27 cm from incisors was dilated with 18-19-20 mm balloons.  Diverticulum noted in D2.  Otherwise exam normal. Previous endoscopy 2015, also for dysphagia, along with more remote colonoscopy performed in [place]. Patient says she has relief of her solid and liquid dysphagia for 3 or 4 months following dilatation.  She takes Prilosec daily. She has constipation.  In the past she has treated this with laxatives but she is not taking laxatives any longer.  Generally has a bowel movement every 3 or 4 days but it can be a week between bowel movements.  At times she has to manually self disimpact herself.  She complains of discomfort in her lower abdomen, this is worse at night but it does not interrupt her ability to sleep.  Several months ago, within the last year, she had episode of hematochezia but it quickly resolved.  She has not seen any dark stools or blood per rectum.  No nausea or vomiting.  Her solid and liquid food dysphagia has returned.  She has a globus sensation.  At times she has trouble handling her  oral secretions.  In late January 2019 she was complaining of fatigue.  Her Hgb was 7.9, compared with 11.1 in May 2018. On 05/05/17 Iron was 16, ferritin 13 .  PMD note from 05/06/16 office visit mentions starting oral iron but the patient has not started this yet.  Presented to [facility] ED after witnessed fall at home.  No clear cause for the fall and no clear syncopal episode, she denied prodrome of dyspnea, dizziness, palpitations.  She recalls that she got up to go somewhere within the house and fell over she hit her head her hand and her leg.  She has had falls at home earlier this year as well. CT negative for acute trauma or bleed. Hgb 6.3, received 1 U PRBC and now 7.7.  MCV 81. Stable GFR/renal function. No coags.  Last took Rivaroxiban on 4/21.  No NSAIDs, ASA, ETOH.   No fm hx of colon cancer, GIB, ulcers.      Past Medical History:  Diagnosis Date  . Anemia   . Arthritis   . Basal cell carcinoma    nose  . Diabetes mellitus without complication ([place])  pt denies diabetes  . GERD (gastroesophageal reflux disease)   . Hiatal hernia   . HLD (hyperlipidemia)   . Hypertension   . Seizure ([place]) 2011    Past Surgical History:  Procedure Laterality Date  . CARDIAC DEFIBRILLATOR PLACEMENT    . CATARACT EXTRACTION Left 02/2001  . CHOLECYSTECTOMY    . LUMBAR LAMINECTOMY  06/11/2001  . LUMBAR LAMINECTOMY  02/09/2006  . LUMBAR LAMINECTOMY  07/07/2006   with spianal cord stimulator  . SPINAL CORD STIMULATOR REMOVAL  14/2009  . TUBAL LIGATION      Prior to Admission medications   Medication Sig Start Date End Date Taking? Authorizing Provider  atorvastatin (LIPITOR) 40 MG tablet Take 1 tablet (40 mg total) by mouth daily. 06/17/16  Yes Sela Hilding, MD  Cholecalciferol (VITAMIN D-3) 1000 units CAPS Take 1,000 Units by mouth daily.   Yes [provider]  metoprolol tartrate (LOPRESSOR) 25 MG tablet Take 1 tablet (25 mg total) by mouth 2 (two) times daily. 05/03/17  08/01/17 Yes Camnitz, Will Hassell Done, MD  Multiple Vitamins-Minerals (ICAPS AREDS 2 PO) Take 1 capsule by mouth 2 (two) times daily.    Yes [provider]  omeprazole (PRILOSEC) 40 MG capsule Take 1 capsule (40 mg total) by mouth daily. 05/20/16  Yes Armbruster, Carlota Raspberry, MD  Propylene Glycol (SYSTANE COMPLETE) 0.6 % SOLN Place 1 drop into both eyes daily.   Yes [provider]  Rivaroxaban (XARELTO) 15 MG TABS tablet Take 1 tablet (15 mg total) by mouth daily with supper. 06/08/17  Yes Camnitz, Ocie Doyne, MD  tolterodine (DETROL LA) 2 MG 24 hr capsule Take 1 capsule (2 mg total) by mouth daily. 05/05/17  Yes Sela Hilding, MD  verapamil (CALAN-SR) 240 MG CR tablet Take 1 tablet (240 mg total) by mouth at bedtime. 06/22/17  Yes Camnitz, Will Hassell Done, MD  Iron, Ferrous Sulfate, 142 (45 Fe) MG TBCR Take 1 tablet by mouth every other day. 07/05/17   Sela Hilding, MD    Scheduled Meds: . atorvastatin  40 mg Oral Daily  . ferrous sulfate  325 mg Oral QODAY  . metoprolol tartrate  25 mg Oral BID  . pantoprazole  40 mg Oral BID  . verapamil  240 mg Oral QHS   Infusions:  PRN Meds: acetaminophen **OR** acetaminophen, polyethylene glycol   Allergies as of 07/17/2017 - Review Complete 07/17/2017  Allergen Reaction Noted  . Sotalol Other (See Comments) 03/26/2016  . Erythromycin Nausea Only 03/26/2016  . Augmentin [amoxicillin-pot clavulanate] Rash 03/26/2016  . Crestor [rosuvastatin calcium] Rash 03/26/2016  . Keflex [cephalexin] Rash 03/26/2016  . Septra [sulfamethoxazole-trimethoprim] Rash 03/26/2016    Family History  Problem Relation Age of Onset  . Diabetes Mother   . Heart failure Father   . Breast cancer Sister   . Heart failure Sister   . Diabetes Son   . Diabetes Sister   . Colon cancer Neg Hx   . Stomach cancer Neg Hx   . Rectal cancer Neg Hx   . Esophageal cancer Neg Hx     Social History   Socioeconomic History  . Marital status: Married     Spouse name: Not on file  . Number of children: 2  . Years of education: Not on file  . Highest education level: Not on file  Occupational History  . Occupation: retired  Scientific laboratory technician  . Financial resource strain: Not on file  . Food insecurity:    Worry: Not on file  Inability: Not on file  . Transportation needs:    Medical: Not on file    Non-medical: Not on file  Tobacco Use  . Smoking status: Never Smoker  . Smokeless tobacco: Never Used  Substance and Sexual Activity  . Alcohol use: No  . Drug use: No  . Sexual activity: Not on file  Lifestyle  . Physical activity:    Days per week: Not on file    Minutes per session: Not on file  . Stress: Not on file  Relationships  . Social connections:    Talks on phone: Not on file    Gets together: Not on file    Attends religious service: Not on file    Active member of club or organization: Not on file    Attends meetings of clubs or organizations: Not on file    Relationship status: Not on file  . Intimate partner violence:    Fear of current or ex partner: Not on file    Emotionally abused: Not on file    Physically abused: Not on file    Forced sexual activity: Not on file  Other Topics Concern  . Not on file  Social History Narrative  . Not on file    REVIEW OF SYSTEMS: Constitutional: Fatigue, weakness. ENT:  No nose bleeds.  Planes of dry mouth which makes it difficult to talk. Pulm: Cough, sometimes productive of clear sputum. CV:  No palpitations, no chest pain.  Intermittent swelling in her ankles..  GU:  No hematuria, no frequency.  Blood in the urine. GI:  Per HPI.   Heme: No unusual or excessive bleeding/bruising. Transfusions: Last night, none before that. Neuro:  No headaches, no peripheral tingling or numbness Derm:  No itching, no rash or sores.  Endocrine:  No sweats or chills.  No polyuria or dysuria Immunization: Current on her flu shot. Travel:  None beyond local counties in last few  months.    PHYSICAL EXAM: Vital signs in last 24 hours: Vitals:   07/18/17 0622 07/18/17 0758  BP: (!) 103/43 100/82  Pulse: 88 69  Resp:  18  Temp: 98.2 F (36.8 C) 98.2 F (36.8 C)  SpO2:  98%   Wt Readings from Last 3 Encounters:  07/18/17 136 lb 0.4 oz (61.7 kg)  07/04/17 135 lb 6.4 oz (61.4 kg)  05/05/17 137 lb 3.2 oz (62.2 kg)    General: Pleasant, somewhat frail and pale elderly WF. Head: Bruising and slight abrasions around the left eye forehead and zygoma.  No facial asymmetry. Eyes: No scleral icterus.  Conjunctiva pale. Ears: Slightly HOH. Nose: No discharge or congestion. Mouth: Oral mucosa is clear, dry.  Tongue is midline. Neck: No JVD, no masses, no thyromegaly. Lungs: Clear bilaterally.  No labored breathing.  Periodic coughing which seems like she is trying to cough up oral secretions. Heart: RRR.  No MRG.  S1, S2 present.  ICD apparatus positioned in the left upper chest Abdomen: Soft.  Mildly tender in the lower abdomen.  No masses, no HSM, no bruits, no hernias..  Rectal: Deferred Musc/Skeltl: No joint redness, swelling or significant deformity. Extremities: No CCE. Neurologic: Fully alert.  Oriented x3.  Good historian.  Moves all 4 limbs without tremor.  Strength not tested.  No gross neurologic deficits. Skin: No rashes, no sores, no telangiectasia, no tattoos. Nodes: No cervical adenopathy. Psych: Pleasant, calm, cooperative.  Intake/Output from previous day: 04/22 0701 - 04/23 0700 In: 455 [P.O.:120; Blood:335] Out: 0  Intake/Output this shift: Total I/O In: 360 [P.O.:360] Out: -   LAB RESULTS: Recent Labs    07/17/17 1700 07/18/17 0241  WBC 8.3 6.7  HGB 6.3* 7.7*  HCT 20.6* 24.6*  PLT 289 239   BMET Lab Results  Component Value Date   NA 136 07/18/2017   NA 133 (L) 07/17/2017   NA 136 04/20/2017   K 3.6 07/18/2017   K 3.8 07/17/2017   K 4.7 04/20/2017   CL 105 07/18/2017   CL 103 07/17/2017   CL 102 04/20/2017   CO2 21 (L)  07/18/2017   CO2 19 (L) 07/17/2017   CO2 20 04/20/2017   GLUCOSE 126 (H) 07/18/2017   GLUCOSE 159 (H) 07/17/2017   GLUCOSE 150 (H) 04/20/2017   BUN 18 07/18/2017   BUN 22 (H) 07/17/2017   BUN 29 (H) 04/20/2017   CREATININE 1.29 (H) 07/18/2017   CREATININE 1.50 (H) 07/17/2017   CREATININE 1.58 (H) 04/20/2017   CALCIUM 8.7 (L) 07/18/2017   CALCIUM 8.6 (L) 07/17/2017   CALCIUM 10.6 (H) 04/20/2017   LFT No results for input(s): PROT, ALBUMIN, AST, ALT, ALKPHOS, BILITOT, BILIDIR, IBILI in the last 72 hours. PT/INR No results found for: INR, PROTIME Hepatitis Panel No results for input(s): HEPBSAG, HCVAB, HEPAIGM, HEPBIGM in the last 72 hours. C-Diff No components found for: CDIFF Lipase  No results found for: LIPASE  Drugs of Abuse  No results found for: LABOPIA, COCAINSCRNUR, LABBENZ, AMPHETMU, THCU, LABBARB   RADIOLOGY STUDIES: Dg Chest 2 View  Result Date: 07/17/2017 CLINICAL DATA:  Chest pain after fall. EXAM: CHEST - 2 VIEW COMPARISON:  Chest x-ray dated Jul 30, 2016. FINDINGS: Unchanged left chest wall AICD. The heart remains at the upper limits of normal in size. Normal pulmonary vascularity. No focal consolidation, pleural effusion, or pneumothorax. Unchanged moderate hiatal hernia. No acute osseous abnormality. IMPRESSION: No active cardiopulmonary disease. Electronically Signed   By: Titus Dubin M.D.   On: 07/17/2017 15:59   Dg Pelvis 1-2 Views  Result Date: 07/17/2017 CLINICAL DATA:  Fall. EXAM: PELVIS - 1-2 VIEW COMPARISON:  None. FINDINGS: There is no evidence of acute pelvic fracture or diastasis. Old healed fracture of the left inferior pubic ramus. No pelvic bone lesions are seen. Extensive lumbar spinal fusion hardware. Osteopenia. IMPRESSION: 1.  No acute osseous abnormality. Electronically Signed   By: Titus Dubin M.D.   On: 07/17/2017 15:58   Dg Knee 2 Views Right  Result Date: 07/17/2017 CLINICAL DATA:  Status post fall.  Distal right lower leg pain.  EXAM: RIGHT KNEE - 1-2 VIEW COMPARISON:  None. FINDINGS: No evidence of fracture, dislocation, or joint effusion. No evidence of arthropathy or other focal bone abnormality. Vascular calcifications noted. IMPRESSION: 1. No acute findings. Electronically Signed   By: Kerby Moors M.D.   On: 07/17/2017 15:58   Dg Tibia/fibula Right  Result Date: 07/17/2017 CLINICAL DATA:  Fall with right leg pain.  Initial encounter. EXAM: RIGHT TIBIA AND FIBULA - 2 VIEW COMPARISON:  None. FINDINGS: Negative for acute fracture or malalignment. Osteopenia.  Vascular calcification. IMPRESSION: No acute finding. Electronically Signed   By: Monte Fantasia M.D.   On: 07/17/2017 16:08   Ct Head Wo Contrast  Result Date: 07/17/2017 CLINICAL DATA:  C-spine trauma, tripped and fell, patient on blood thinners. EXAM: CT HEAD WITHOUT CONTRAST CT CERVICAL SPINE WITHOUT CONTRAST TECHNIQUE: Multidetector CT imaging of the head and cervical spine was performed following the standard protocol without intravenous contrast. Multiplanar CT  image reconstructions of the cervical spine were also generated. COMPARISON:  CT head 07/30/2016. FINDINGS: CT HEAD FINDINGS Brain: No evidence for acute infarction, hemorrhage, mass lesion, or extra-axial fluid. Advanced atrophy. Hydrocephalus ex vacuo. Extensive hypoattenuation of white matter, likely small vessel disease. Vascular: Calcification of the cavernous internal carotid arteries consistent with cerebrovascular atherosclerotic disease. No signs of intracranial large vessel occlusion. Skull: No skull fracture.  RIGHT frontal soft tissue swelling. Sinuses/Orbits: No layering sinus fluid. BILATERAL cataract extraction. Other: None. CT CERVICAL SPINE FINDINGS Alignment: Exaggerated lordosis.  No traumatic subluxation. Skull base and vertebrae: No acute fracture. No primary bone lesion or focal pathologic process. BILATERAL facet arthropathy. Osseous spurring. Soft tissues and spinal canal:  Unremarkable. Disc levels: Advanced disc space narrowing C5-6 and C6-7. Vacuum phenomenon in the foramen C6-7 on the LEFT. Upper chest: Lung apices clear. Other: None. IMPRESSION: Advanced atrophy. No skull fracture or intracranial hemorrhage. RIGHT frontal scalp hematoma. Cervical spondylosis most pronounced C5-6 and C6-7. No cervical spine fracture or traumatic subluxation. Electronically Signed   By: Staci Righter M.D.   On: 07/17/2017 16:32   Ct Cervical Spine Wo Contrast  Result Date: 07/17/2017 CLINICAL DATA:  C-spine trauma, tripped and fell, patient on blood thinners. EXAM: CT HEAD WITHOUT CONTRAST CT CERVICAL SPINE WITHOUT CONTRAST TECHNIQUE: Multidetector CT imaging of the head and cervical spine was performed following the standard protocol without intravenous contrast. Multiplanar CT image reconstructions of the cervical spine were also generated. COMPARISON:  CT head 07/30/2016. FINDINGS: CT HEAD FINDINGS Brain: No evidence for acute infarction, hemorrhage, mass lesion, or extra-axial fluid. Advanced atrophy. Hydrocephalus ex vacuo. Extensive hypoattenuation of white matter, likely small vessel disease. Vascular: Calcification of the cavernous internal carotid arteries consistent with cerebrovascular atherosclerotic disease. No signs of intracranial large vessel occlusion. Skull: No skull fracture.  RIGHT frontal soft tissue swelling. Sinuses/Orbits: No layering sinus fluid. BILATERAL cataract extraction. Other: None. CT CERVICAL SPINE FINDINGS Alignment: Exaggerated lordosis.  No traumatic subluxation. Skull base and vertebrae: No acute fracture. No primary bone lesion or focal pathologic process. BILATERAL facet arthropathy. Osseous spurring. Soft tissues and spinal canal: Unremarkable. Disc levels: Advanced disc space narrowing C5-6 and C6-7. Vacuum phenomenon in the foramen C6-7 on the LEFT. Upper chest: Lung apices clear. Other: None. IMPRESSION: Advanced atrophy. No skull fracture or  intracranial hemorrhage. RIGHT frontal scalp hematoma. Cervical spondylosis most pronounced C5-6 and C6-7. No cervical spine fracture or traumatic subluxation. Electronically Signed   By: Staci Righter M.D.   On: 07/17/2017 16:32   Dg Hand Complete Left  Result Date: 07/17/2017 CLINICAL DATA:  Status post fall with pain. EXAM: LEFT HAND - COMPLETE 3+ VIEW COMPARISON:  None. FINDINGS: There is no evidence of fracture or dislocation. 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 IMPRESSION: No acute fracture or dislocation identified about the left hand. Osteoarthritic changes. Electronically Signed   By: Fidela Salisbury M.D.   On: 07/17/2017 15:59     IMPRESSION:   *  Acute on chronic anemia.  IDA by labs in 04/2017.  Had not started oral iron at home.  Po iron initiated since arrival. Hgb improved after 1 U PRBC.  1 more unit ordered for today. Low suspicion of acute GI bleed.  Suspect this is worsening of chronic, iron deficiency anemia.  Given no ulcer disease or gastritis on EGD 2 months ago and daily Prilosec, low suspicion for ulcer/mucosal disease.  *   Fall with bruising but no head  trauma.  Not clear that she had syncope.  *   Dysphagia.  S/p dilations of esophageal stricture, last was 04/2016.  Difficulty swallowing has recurred.  Suspected motility, though has not had esophagram to verify this..  *   Constipation.  Suspect Verapamil is playing signif role in this.    *    S/p ICD placement for history of V. tach.  The defibrillator did not discharge yesterday.    PLAN:     *  Colonoscopy and upper endoscopy.  Set this up for tomorrow.  If prep incomplete due to severe constipation can postpone until 4/25, but aim for 4/24.   Risks and benefits discussed with patient and she is agreeable to proceed. Plan to proceed slowly with prep given her dysphagia.  *   Stop oral iron for the time being.  If we proceed with any sort of  endoscopic/colonoscopic procedures the iron is going to make for confusion as to whether there is blood present. Might benefit from receiving Feraheme which Pharm D can dose, sway he might be able to limit 3 oral iron.  *    Although it is not likely, wonder if the verapamil could be discontinued to see if it helps her constipation.  *   She does not need CBCs drawn every 4 hours, I changed lab order timing around.   Azucena Freed  07/18/2017, 9:32 AM Phone [phone]

## 2017-07-18 NOTE — Progress Notes (Signed)
  Echocardiogram 2D Echocardiogram has been performed.  Brenda [name] 07/18/2017, 12:01 PM

## 2017-07-18 NOTE — Progress Notes (Signed)
PT Cancellation Note  Patient Details Name: Brenda [name] MRN: [id number] DOB: [birth year]   Cancelled Treatment:    Reason Eval/Treat Not Completed: Other (comment).  Pt was receiving medical care then was declining for her transfusion.  Will try later as time and pt allow.   Ramond Dial 07/18/2017, 1:12 PM   Mee Hives, PT MS Acute Rehab Dept. Number: [place] [facility] [place]

## 2017-07-19 ENCOUNTER — Encounter (HOSPITAL_COMMUNITY)
Admission: EM | Disposition: A | Discharge status: Home Health | Payer: Self-pay | Source: Home / Self Care | Attending: Family Medicine

## 2017-07-19 ENCOUNTER — Inpatient Hospital Stay (HOSPITAL_COMMUNITY): Payer: Medicare Other | Admitting: Anesthesiology

## 2017-07-19 ENCOUNTER — Encounter (HOSPITAL_COMMUNITY): Payer: Self-pay | Admitting: *Deleted

## 2017-07-19 DIAGNOSIS — S0083XA Contusion of other part of head, initial encounter: Secondary | ICD-10-CM

## 2017-07-19 DIAGNOSIS — K449 Diaphragmatic hernia without obstruction or gangrene: Secondary | ICD-10-CM

## 2017-07-19 DIAGNOSIS — K5730 Diverticulosis of large intestine without perforation or abscess without bleeding: Secondary | ICD-10-CM

## 2017-07-19 DIAGNOSIS — K259 Gastric ulcer, unspecified as acute or chronic, without hemorrhage or perforation: Secondary | ICD-10-CM

## 2017-07-19 DIAGNOSIS — Q399 Congenital malformation of esophagus, unspecified: Secondary | ICD-10-CM

## 2017-07-19 DIAGNOSIS — K649 Unspecified hemorrhoids: Secondary | ICD-10-CM

## 2017-07-19 DIAGNOSIS — K5710 Diverticulosis of small intestine without perforation or abscess without bleeding: Secondary | ICD-10-CM

## 2017-07-19 HISTORY — PX: ESOPHAGOGASTRODUODENOSCOPY (EGD) WITH PROPOFOL: SHX5813

## 2017-07-19 HISTORY — PX: COLONOSCOPY WITH PROPOFOL: SHX5780

## 2017-07-19 LAB — BPAM RBC
Blood Product Expiration Date: 201905182359
Blood Product Expiration Date: 201905242359
ISSUE DATE / TIME: 201904222113
ISSUE DATE / TIME: 201904231119
Unit Type and Rh: 9500
Unit Type and Rh: 9500

## 2017-07-19 LAB — CBC
HCT: 32.8 % — ABNORMAL LOW (ref 36.0–46.0)
HCT: 30.4 % — ABNORMAL LOW (ref 36.0–46.0)
Hemoglobin: 10.2 g/dL — ABNORMAL LOW (ref 12.0–15.0)
Hemoglobin: 9.5 g/dL — ABNORMAL LOW (ref 12.0–15.0)
MCH: 25.7 pg — ABNORMAL LOW (ref 26.0–34.0)
MCH: 26 pg (ref 26.0–34.0)
MCHC: 31.1 g/dL (ref 30.0–36.0)
MCHC: 31.3 g/dL (ref 30.0–36.0)
MCV: 82.6 fL (ref 78.0–100.0)
MCV: 83.3 fL (ref 78.0–100.0)
Platelets: 274 10*3/uL (ref 150–400)
Platelets: 248 10*3/uL (ref 150–400)
RBC: 3.97 MIL/uL (ref 3.87–5.11)
RBC: 3.65 MIL/uL — ABNORMAL LOW (ref 3.87–5.11)
RDW: 15.7 % — ABNORMAL HIGH (ref 11.5–15.5)
RDW: 16.1 % — ABNORMAL HIGH (ref 11.5–15.5)
WBC: 7.8 10*3/uL (ref 4.0–10.5)
WBC: 7 10*3/uL (ref 4.0–10.5)

## 2017-07-19 LAB — TYPE AND SCREEN
ABO/RH(D): O NEG
Antibody Screen: NEGATIVE
Unit division: 0
Unit division: 0

## 2017-07-19 LAB — BASIC METABOLIC PANEL
Anion gap: 13 (ref 5–15)
BUN: 17 mg/dL (ref 6–20)
CO2: 17 mmol/L — ABNORMAL LOW (ref 22–32)
Calcium: 8.4 mg/dL — ABNORMAL LOW (ref 8.9–10.3)
Chloride: 107 mmol/L (ref 101–111)
Creatinine, Ser: 1.43 mg/dL — ABNORMAL HIGH (ref 0.44–1.00)
GFR calc Af Amer: 38 mL/min — ABNORMAL LOW (ref >60)
GFR calc non Af Amer: 33 mL/min — ABNORMAL LOW (ref >60)
Glucose, Bld: 125 mg/dL — ABNORMAL HIGH (ref 65–99)
Potassium: 4 mmol/L (ref 3.5–5.1)
Sodium: 137 mmol/L (ref 135–145)

## 2017-07-19 SURGERY — COLONOSCOPY WITH PROPOFOL
Anesthesia: Monitor Anesthesia Care

## 2017-07-19 MED ORDER — PROPOFOL 500 MG/50ML IV EMUL
INTRAVENOUS | Status: DC | PRN
  Administered 2017-07-19: 75 ug/kg/min via INTRAVENOUS

## 2017-07-19 MED ORDER — LACTATED RINGERS IV SOLN
INTRAVENOUS | Status: DC | PRN
  Administered 2017-07-19: 11:00:00 via INTRAVENOUS

## 2017-07-19 MED ORDER — PEG-KCL-NACL-NASULF-NA ASC-C 100 G PO SOLR
0.5000 | Freq: Once | ORAL | Status: DC
  Filled 2017-07-19: qty 1

## 2017-07-19 MED ORDER — BUTAMBEN-TETRACAINE-BENZOCAINE 2-2-14 % EX AERO
INHALATION_SPRAY | CUTANEOUS | Status: DC | PRN
  Administered 2017-07-19: 2 via TOPICAL

## 2017-07-19 MED ORDER — FERUMOXYTOL INJECTION 510 MG/17 ML
510.0000 mg | Freq: Once | INTRAVENOUS | Status: AC
  Administered 2017-07-19: 510 mg via INTRAVENOUS
  Filled 2017-07-19: qty 17

## 2017-07-19 MED ORDER — PEG-KCL-NACL-NASULF-NA ASC-C 100 G PO SOLR: 1.0000 | Freq: Once | ORAL | Status: DC

## 2017-07-19 MED ORDER — POLYETHYLENE GLYCOL 3350 17 G PO PACK
17.0000 g | PACK | Freq: Every day | ORAL | Status: DC
  Administered 2017-07-20: 17 g via ORAL
  Filled 2017-07-19: qty 1

## 2017-07-19 MED ORDER — LIDOCAINE 2% (20 MG/ML) 5 ML SYRINGE
INTRAMUSCULAR | Status: DC | PRN
  Administered 2017-07-19: 60 mg via INTRAVENOUS

## 2017-07-19 MED ORDER — PROPOFOL 10 MG/ML IV BOLUS
INTRAVENOUS | Status: DC | PRN
  Administered 2017-07-19: 10 mg via INTRAVENOUS
  Administered 2017-07-19: 40 mg via INTRAVENOUS

## 2017-07-19 SURGICAL SUPPLY — 25 items

## 2017-07-19 NOTE — Anesthesia Postprocedure Evaluation (Signed)
Anesthesia Post Note  Patient: Brenda [name]  Procedure(s) Performed: COLONOSCOPY WITH PROPOFOL (N/A ) ESOPHAGOGASTRODUODENOSCOPY (EGD) WITH PROPOFOL (N/A )     Patient location during evaluation: PACU Anesthesia Type: MAC Level of consciousness: awake and alert Pain management: pain level controlled Vital Signs Assessment: post-procedure vital signs reviewed and stable Respiratory status: spontaneous breathing, nonlabored ventilation and respiratory function stable Cardiovascular status: stable and blood pressure returned to baseline Anesthetic complications: no    Last Vitals:  Vitals:   07/19/17 1357 07/19/17 1604  BP: (!) 130/52 (!) 147/59  Pulse:  76  Resp:  18  Temp:  36.6 C  SpO2:  100%    Last Pain:  Vitals:   07/19/17 1604  TempSrc: Oral  PainSc:                  [name]

## 2017-07-19 NOTE — Interval H&P Note (Signed)
History and Physical Interval Note: For EGD and colonoscopy today The nature of the procedure, as well as the risks, benefits, and alternatives were carefully and thoroughly reviewed with the patient. Ample time for discussion and questions allowed. The patient understood, was satisfied, and agreed to proceed.   CBC Latest Ref Rng & Units 07/19/2017 07/19/2017 07/18/2017  WBC 4.0 - 10.5 K/uL 7.0 7.8 7.2  Hemoglobin 12.0 - 15.0 g/dL 9.5(L) 10.2(L) 9.7(L)  Hematocrit 36.0 - 46.0 % 30.4(L) 32.8(L) 30.7(L)  Platelets 150 - 400 K/uL 248 274 234      07/19/2017 11:51 AM  Brenda [name]  has presented today for surgery, with the diagnosis of anemia, constipation.  The various methods of treatment have been discussed with the patient and family. After consideration of risks, benefits and other options for treatment, the patient has consented to  Procedure(s): COLONOSCOPY WITH PROPOFOL (N/A) ESOPHAGOGASTRODUODENOSCOPY (EGD) WITH PROPOFOL (N/A) as a surgical intervention .  The patient's history has been reviewed, patient examined, no change in status, stable for surgery.  I have reviewed the patient's chart and labs.  Questions were answered to the patient's satisfaction.     [name]

## 2017-07-19 NOTE — Progress Notes (Signed)
Family Medicine Teaching Service Daily Progress Note Intern Pager: [phone]  Patient name: Brenda [name] Medical record number: [id number] Date of birth: [birth year] Age: 84 y.o. Gender: female  Primary Care Provider: Sela Hilding, MD Consultants: GI Code Status: DNR/DNI confirmed on admission with family at bedside  Pt Overview and Major Events to Date:  Brenda [name] is a 84 y.o. female presenting with fall, found to have symptomatic anemia . PMH is significant for T2DM, Paroxysmal Afib on xarelto (held), AICD in place, T2DM, HTN  Assessment and Plan:  Acute on chronic anemia - Improved. Hgb 6.3 > 7.7  sp 1u pRBC > 9.7 s/p 2nd transfusion pRBC > Stable past 12 hrs 9-10.  NPO. Going for EGD/colonoscopy today per GI to r/o bleed. Neg FOBT. Holding iron in setting of h/o esophogitis. Can consider IV iron.  - Protonix BID 40 mg - hold home xarelto for now, SCDs for anticoagulation  Fall w/multiple abrasions- Patient does not remember falling. 2 prior falls this year per family at bedside. Does endorse feeling like the room is spinning. Unsure if it is due to the anemia. No evidence of nystagmus on exam. CT head/cervical spine negative for fx. Other xrays negative for fx. - monitor on telemetry - check cardiac echo - monitor neuro checks q4H - orthostatics - up with assistance - PT/OT  Dysphagia Reports food getting stuck in throat. H/o esophogitis. Getting evaluated by GI with EGD. SLP also consulted. - NPO for procedure - follow GI recommendations - SLP recommendations - PPI  Paroxysmal afib - NSR on admission EKG, and rate controlled. Home meds include: lopressor 25 mg BID and Xarelto. CHADVasc2 4. HASBLED 3.  - hold xarelto as noted above in setting of possible bleed/anemia - continue home lopressor and verapamil for rate control - appreciate GI recs on restarting anticoagulation - if GI bleed found, discuss risk and benefits of anticoagulation, especially in  setting of recent multiple falls.   HTN Normotensive.  - cont Home medication Verapamil 240 mg QHS reordered.  Prediabetes - A1c 5.7 on 04/2016 test. Monitor glucose on AM BMP.  Hx TIA  Patient denies hx of stroke, family at bedside endorses her hx of "mini stroke" without residual deficits. Home medication includes atorvastatin 40 mg daily. Not on ASA. - continue home statin  AICD present  - noted.  Prolonged QTC - 493 on admission. Avoid QTc prolonging medications.  Constipation Patient reports regular constipation with multiple hard balls of stool with some fecal incontinence. Per GI, possibly due to stopping verapamil to an alternative agent - stop oral iron - miralax prn  FEN/GI: NPO Prophylaxis: SCDs, holding anticoagulation in setting of bleed/anemia   Disposition: inpatient for work up of anemia and falls  Subjective:  Patient feels that she did not tolerate prep well. Says she was nauseaus. Otherwise, denies any denies SOB, CP. Wants to go home.   Objective: Temp:  [97.7 F (36.5 C)-98.2 F (36.8 C)] 98 F (36.7 C) (04/24 0741) Pulse Rate:  [69-83] 70 (04/24 0741) Resp:  [15-22] 18 (04/24 0741) BP: (88-155)/(42-99) 128/51 (04/24 0741) SpO2:  [98 %-100 %] 100 % (04/24 0741) Physical Exam: Gen: NAD, resting comfortably CV: RRR with no murmurs appreciated Pulm: NWOB, CTAB  GI: Normal bowel sounds present. Soft, Nontender, Nondistended. Skin: multiple abbrations on head and right arm, no s/s of infection Neuro: grossly normal, moves all extremities, no nystagmus  Laboratory: Recent Labs  Lab 07/18/17 1756 07/19/17 0151 07/19/17 0709  WBC 7.2 7.8 7.0  HGB 9.7* 10.2* 9.5*  HCT 30.7* 32.8* 30.4*  PLT 234 274 248   Recent Labs  Lab 07/17/17 1700 07/18/17 0241 07/19/17 0709  NA 133* 136 137  K 3.8 3.6 4.0  CL 103 105 107  CO2 19* 21* 17*  BUN 22* 18 17  CREATININE 1.50* 1.29* 1.43*  CALCIUM 8.6* 8.7* 8.4*  GLUCOSE 159* 126* 125*       Imaging/Diagnostic Tests: Dg Chest 2 View  Result Date: 07/17/2017 CLINICAL DATA:  Chest pain after fall. EXAM: CHEST - 2 VIEW COMPARISON:  Chest x-ray dated Jul 30, 2016. FINDINGS: Unchanged left chest wall AICD. The heart remains at the upper limits of normal in size. Normal pulmonary vascularity. No focal consolidation, pleural effusion, or pneumothorax. Unchanged moderate hiatal hernia. No acute osseous abnormality. IMPRESSION: No active cardiopulmonary disease. Electronically Signed   By: Titus Dubin M.D.   On: 07/17/2017 15:59   Dg Pelvis 1-2 Views  Result Date: 07/17/2017 CLINICAL DATA:  Fall. EXAM: PELVIS - 1-2 VIEW COMPARISON:  None. FINDINGS: There is no evidence of acute pelvic fracture or diastasis. Old healed fracture of the left inferior pubic ramus. No pelvic bone lesions are seen. Extensive lumbar spinal fusion hardware. Osteopenia. IMPRESSION: 1.  No acute osseous abnormality. Electronically Signed   By: Titus Dubin M.D.   On: 07/17/2017 15:58   Dg Knee 2 Views Right  Result Date: 07/17/2017 CLINICAL DATA:  Status post fall.  Distal right lower leg pain. EXAM: RIGHT KNEE - 1-2 VIEW COMPARISON:  None. FINDINGS: No evidence of fracture, dislocation, or joint effusion. No evidence of arthropathy or other focal bone abnormality. Vascular calcifications noted. IMPRESSION: 1. No acute findings. Electronically Signed   By: Kerby Moors M.D.   On: 07/17/2017 15:58   Dg Tibia/fibula Right  Result Date: 07/17/2017 CLINICAL DATA:  Fall with right leg pain.  Initial encounter. EXAM: RIGHT TIBIA AND FIBULA - 2 VIEW COMPARISON:  None. FINDINGS: Negative for acute fracture or malalignment. Osteopenia.  Vascular calcification. IMPRESSION: No acute finding. Electronically Signed   By: Monte Fantasia M.D.   On: 07/17/2017 16:08   Ct Head Wo Contrast  Result Date: 07/17/2017 CLINICAL DATA:  C-spine trauma, tripped and fell, patient on blood thinners. EXAM: CT HEAD WITHOUT  CONTRAST CT CERVICAL SPINE WITHOUT CONTRAST TECHNIQUE: Multidetector CT imaging of the head and cervical spine was performed following the standard protocol without intravenous contrast. Multiplanar CT image reconstructions of the cervical spine were also generated. COMPARISON:  CT head 07/30/2016. FINDINGS: CT HEAD FINDINGS Brain: No evidence for acute infarction, hemorrhage, mass lesion, or extra-axial fluid. Advanced atrophy. Hydrocephalus ex vacuo. Extensive hypoattenuation of white matter, likely small vessel disease. Vascular: Calcification of the cavernous internal carotid arteries consistent with cerebrovascular atherosclerotic disease. No signs of intracranial large vessel occlusion. Skull: No skull fracture.  RIGHT frontal soft tissue swelling. Sinuses/Orbits: No layering sinus fluid. BILATERAL cataract extraction. Other: None. CT CERVICAL SPINE FINDINGS Alignment: Exaggerated lordosis.  No traumatic subluxation. Skull base and vertebrae: No acute fracture. No primary bone lesion or focal pathologic process. BILATERAL facet arthropathy. Osseous spurring. Soft tissues and spinal canal: Unremarkable. Disc levels: Advanced disc space narrowing C5-6 and C6-7. Vacuum phenomenon in the foramen C6-7 on the LEFT. Upper chest: Lung apices clear. Other: None. IMPRESSION: Advanced atrophy. No skull fracture or intracranial hemorrhage. RIGHT frontal scalp hematoma. Cervical spondylosis most pronounced C5-6 and C6-7. No cervical spine fracture or traumatic subluxation. Electronically Signed   By: Roderic Ovens.D.  On: 07/17/2017 16:32   Ct Cervical Spine Wo Contrast  Result Date: 07/17/2017 CLINICAL DATA:  C-spine trauma, tripped and fell, patient on blood thinners. EXAM: CT HEAD WITHOUT CONTRAST CT CERVICAL SPINE WITHOUT CONTRAST TECHNIQUE: Multidetector CT imaging of the head and cervical spine was performed following the standard protocol without intravenous contrast. Multiplanar CT image reconstructions of  the cervical spine were also generated. COMPARISON:  CT head 07/30/2016. FINDINGS: CT HEAD FINDINGS Brain: No evidence for acute infarction, hemorrhage, mass lesion, or extra-axial fluid. Advanced atrophy. Hydrocephalus ex vacuo. Extensive hypoattenuation of white matter, likely small vessel disease. Vascular: Calcification of the cavernous internal carotid arteries consistent with cerebrovascular atherosclerotic disease. No signs of intracranial large vessel occlusion. Skull: No skull fracture.  RIGHT frontal soft tissue swelling. Sinuses/Orbits: No layering sinus fluid. BILATERAL cataract extraction. Other: None. CT CERVICAL SPINE FINDINGS Alignment: Exaggerated lordosis.  No traumatic subluxation. Skull base and vertebrae: No acute fracture. No primary bone lesion or focal pathologic process. BILATERAL facet arthropathy. Osseous spurring. Soft tissues and spinal canal: Unremarkable. Disc levels: Advanced disc space narrowing C5-6 and C6-7. Vacuum phenomenon in the foramen C6-7 on the LEFT. Upper chest: Lung apices clear. Other: None. IMPRESSION: Advanced atrophy. No skull fracture or intracranial hemorrhage. RIGHT frontal scalp hematoma. Cervical spondylosis most pronounced C5-6 and C6-7. No cervical spine fracture or traumatic subluxation. Electronically Signed   By: Staci Righter M.D.   On: 07/17/2017 16:32   Dg Hand Complete Left  Result Date: 07/17/2017 CLINICAL DATA:  Status post fall with pain. EXAM: LEFT HAND - COMPLETE 3+ VIEW COMPARISON:  None. FINDINGS: There is no evidence of fracture or dislocation. 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 IMPRESSION: No acute fracture or dislocation identified about the left hand. Osteoarthritic changes. Electronically Signed   By: Fidela Salisbury M.D.   On: 07/17/2017 15:59    Bonnita Hollow, MD 07/19/2017, 9:11 AM PGY-1, [place] Intern pager: [phone], text pages  welcome

## 2017-07-19 NOTE — Progress Notes (Signed)
SLP Cancellation Note  Patient Details Name: Mckynzie Liwanag MRN: [id number] DOB: 27-Jan-1932   Cancelled treatment:       Reason Eval/Treat Not Completed: Other (comment) Per chart review, pt NPO this morning pending procedures. Will f/u for swallow eval as able.   Chimamanda, Siegfried 07/19/2017, 8:42 AM  Germain Osgood, M.A. CCC-SLP [phone]

## 2017-07-19 NOTE — Progress Notes (Signed)
PT Cancellation Note  Patient Details Name: Brenda [name] MRN: [id number] DOB: [birth year]   Cancelled Treatment:    Reason Eval/Treat Not Completed: Patient at procedure or test/unavailable, at [facility] 07/19/2017, 1:22 PM

## 2017-07-19 NOTE — Discharge Summary (Signed)
[facility] Discharge Summary  Patient name: Brenda [name] Medical record number: [id number] Date of birth: May 12, 1931 Age: 84 y.o. Gender: female Date of Admission: 07/17/2017  Date of Discharge: 07/20/2017  Admitting Physician: Everrett Coombe, MD  Primary Care Provider: Sela Hilding, MD Consultants: Gastroenterology  Indication for Hospitalization: Falls with symptomatic Anemia 2/2 GI Bleed  Discharge Diagnoses/Problem List:  Patient Active Problem List   Diagnosis Date Noted  . GERD (gastroesophageal reflux disease) 07/18/2017  . Gastrointestinal hemorrhage   . Symptomatic anemia 07/17/2017  . Iron deficiency anemia 05/09/2017  . Osteoporosis 11/24/2016  . Skin cancer of nose 11/23/2016  . 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 UTI (urinary tract infection) 10/26/2016  . Paroxysmal A-fib ([place]) 08/05/2016  . AKI (acute kidney injury) ([place]) 05/25/2016  . Essential hypertension 05/05/2016  . Type 2 diabetes mellitus without complication, without long-term current use of insulin ([place]) 05/05/2016  . Hiatal hernia 05/05/2016  . History of CVA (cerebrovascular accident) without residual deficits 05/05/2016  . Memory change 05/05/2016  . AICD (automatic cardioverter/defibrillator) present 05/05/2016  . Back pain 05/05/2016   Disposition: HH PT/OT  Discharge Condition: Improved  Discharge Exam:  Gen: NAD, resting comfortably CV: RRR, II/VI systolic murmer heard best at left lower sternal border Pulm: NWOB, CTAB with no crackles, wheezes, or rhonchi GI: Normal bowel sounds present. Soft, Nontender, Nondistended. MSK: no edema, cyanosis, or clubbing noted Skin: warm, dry Neuro: grossly normal, moves all extremities Psych: Normal affect and thought content  Brief Hospital Course:  GI Bleed Brenda [name] is a 84 y.o. female who presented with symptomatic anemia secondary to upper GI bleed. Patient was anticoagulated with Xarelto due to PAF.  Patient hemoglobin was 6.3 on admission and improved and stabilized into the 9s. Home Xarelto was held. GI was consulted and performed  EGD and colonoscopy on 4/24. EGD showed nonbleeding Cameron's erosions as suspected source of patient's anemia leading to slow GI bleeding and iron deficiency anemia (previously determined in outpatient work up). GI recommended stopping oral iron and replacing with IV. Patient received 500 mg IV dose of Fereheme. GI also recommended daily PPI and recommended pt could restart home anticoagulation.   S/p Fall prior to admission.  Patient also had fall prior to admission. Work up in ED included negative CXR. EKG NSR w/ occasional PVC, long QTc. Negative acute lesions or fx in CT Head/Neck. Negative Xray for Fx on Left hand, right knee, pelvis, right tib/fib. Pt had no leukocytosis and was afebrile so infection was not felt to contribute to fall. Repeat echocardiogram showed HFpEF w/ G2DD consistent w/ prior echocardiogram. PT and OT evaluated patient and recommended HHPT/OT.   Issues for Follow Up:  1. Iron replaced with ferriheme. Recheck iron studies after 4/27 and consider another treatment 2. Patient on xarelto for paroxysmal afib. Restarted at discharge.  3. SLP saw patient and recommended dysphagia III diet  4. GI outpatient follow up  Significant Procedures:   4/24 - EGD and Colonoscopy  Significant Labs and Imaging:  Recent Labs  Lab 07/19/17 0151 07/19/17 0709 07/20/17 0356  WBC 7.8 7.0 7.4  HGB 10.2* 9.5* 9.2*  HCT 32.8* 30.4* 29.2*  PLT 274 248 239   Recent Labs  Lab 07/18/17 0241 07/19/17 0709 07/20/17 0356  NA 136 137 135  K 3.6 4.0 4.0  CL 105 107 108  CO2 21* 17* 18*  GLUCOSE 126* 125* 155*  BUN 18 17 19   CREATININE 1.29* 1.43* 1.41*  CALCIUM 8.7* 8.4* 8.7*  EGD - Impression 07/19/17 -Tortuous Esophagus    - Large hiatal hernia with a few Cameron's erosions. Could be an intermittent source of slow blood loss and eventual iron  deficiency. - Non-bleeding duodenal diverticula - No specimens collected.  Colonoscopy - Impression 07/19/17 - The examined portion of the ileum was normal. - Severe diverticulosis in the sigmoid colon and in the descending colon. - The examination was otherwise normal on direct and retroflexion views. - No specimens collected.  Results/Tests Pending at Time of Discharge:  Unresulted Labs (From admission, onward)   None       Discharge Medications:  Allergies as of 07/20/2017      Reactions   Sotalol Other (See Comments)   Seizures   Erythromycin Nausea Only   Augmentin [amoxicillin-pot Clavulanate] Rash   Crestor [rosuvastatin Calcium] Rash   Keflex [cephalexin] Rash   Septra [sulfamethoxazole-trimethoprim] Rash      Medication List    STOP taking these medications   Iron (Ferrous Sulfate) 142 (45 Fe) MG Tbcr     TAKE these medications   atorvastatin 40 MG tablet Commonly known as:  LIPITOR Take 1 tablet (40 mg total) by mouth daily.   ICAPS AREDS 2 PO Take 1 capsule by mouth 2 (two) times daily.   metoprolol tartrate 25 MG tablet Commonly known as:  LOPRESSOR Take 1 tablet (25 mg total) by mouth 2 (two) times daily.   omeprazole 40 MG capsule Commonly known as:  PRILOSEC Take 1 capsule (40 mg total) by mouth daily.   polyethylene glycol packet Commonly known as:  MIRALAX / GLYCOLAX Take 17 g by mouth daily.   Rivaroxaban 15 MG Tabs tablet Commonly known as:  XARELTO Take 1 tablet (15 mg total) by mouth daily with supper.   SYSTANE COMPLETE 0.6 % Soln Generic drug:  Propylene Glycol Place 1 drop into both eyes daily.   tolterodine 2 MG 24 hr capsule Commonly known as:  DETROL LA Take 1 capsule (2 mg total) by mouth daily.   verapamil 240 MG CR tablet Commonly known as:  CALAN-SR Take 1 tablet (240 mg total) by mouth at bedtime.   Vitamin D-3 1000 units Caps Take 1,000 Units by mouth daily.       Discharge Instructions: Please refer to  Patient Instructions section of EMR for full details.  Patient was counseled important signs and symptoms that should prompt return to medical care, changes in medications, dietary instructions, activity restrictions, and follow up appointments.   Follow-Up Appointments: Follow-up Information    Constance Haw, MD .   Specialty:  Cardiology Contact information: [address] [place] [address] [phone]        Kathrene Alu, MD Follow up on 07/27/2017.   Specialty:  Family Medicine Why:  Please arrive 15 minutes early for your 3:15 PM appointment. Contact information: [address] [address] [place] Follow up.   Why:  rolling walker will be brought to patients room prior to discharge Contact information: [address] [place] [address] [phone]        [facility] Follow up.   Specialty:  Home Health Services Why:  HHPT, HHOT Contact information: [address] [place] [address] [phone]           Bonnita Hollow, MD 07/24/2017, 11:38 PM PGY-1, [place]

## 2017-07-19 NOTE — Anesthesia Procedure Notes (Signed)
Procedure Name: MAC Date/Time: 07/19/2017 12:38 PM Performed by: Orlie Dakin, CRNA Pre-anesthesia Checklist: Patient identified, Emergency Drugs available, Suction available, Patient being monitored and Timeout performed Patient Re-evaluated:Patient Re-evaluated prior to induction Oxygen Delivery Method: Circle system utilized and Nasal cannula Preoxygenation: Pre-oxygenation with 100% oxygen Induction Type: IV induction

## 2017-07-19 NOTE — Op Note (Signed)
[facility] Patient Name: Brenda [name] Procedure Date : 07/19/2017 MRN: [id number] Attending MD: Jerene Bears , MD Date of Birth: [birth year] CSN: [id number] Age: 84 Admit Type: Inpatient Procedure:                Colonoscopy Indications:              Iron deficiency anemia, acute on chronic anemia Providers:                Lajuan Lines. Hilarie Fredrickson, MD, [facility], RN, Alan Mulder,                            Technician Referring MD:             [facility] Medicines:                Monitored Anesthesia Care Complications:            No immediate complications. Estimated Blood Loss:     Estimated blood loss: none. Procedure:                Pre-Anesthesia Assessment:                           - Prior to the procedure, a History and Physical                            was performed, and patient medications and                            allergies were reviewed. The patient's tolerance of                            previous anesthesia was also reviewed. The risks                            and benefits of the procedure and the sedation                            options and risks were discussed with the patient.                            All questions were answered, and informed consent                            was obtained. Prior Anticoagulants: The patient has                            taken Xarelto (rivaroxaban), last dose was 2 days                            prior to procedure. ASA Grade Assessment: III - A                            patient with severe systemic disease. After  reviewing the risks and benefits, the patient was                            deemed in satisfactory condition to undergo the                            procedure.                           After obtaining informed consent, the colonoscope                            was passed under direct vision. Throughout the                            procedure, the  patient's blood pressure, pulse, and                            oxygen saturations were monitored continuously. The                            EC-[id number] ([id number]) scope was introduced through                            the anus and advanced to the terminal ileum. The                            colonoscopy was performed without difficulty. The                            patient tolerated the procedure well. The quality                            of the bowel preparation was good. The terminal                            ileum, ileocecal valve, appendiceal orifice, and                            rectum were photographed. Scope In: 12:34:27 PM Scope Out: 12:52:04 PM Scope Withdrawal Time: 0 hours 10 minutes 51 seconds  Total Procedure Duration: 0 hours 17 minutes 37 seconds  Findings:      Small external hemorrhoids were found on perianal exam.      The terminal ileum appeared normal.      Many small-mouthed diverticula were found in the sigmoid colon and       descending colon.      The exam was otherwise without abnormality on direct and retroflexion       views. No source of iron deficiency or anemia found during this       examination. Impression:               - The examined portion of the ileum was normal.                           -  Severe diverticulosis in the sigmoid colon and in                            the descending colon.                           - The examination was otherwise normal on direct                            and retroflexion views.                           - No specimens collected. Moderate Sedation:      N/A Recommendation:           - Return patient to hospital ward for ongoing care.                           - Resume previous diet.                           - Continue present medications.                           - Replace oral iron.                           - Begin MiraLax 17 g daily for chronic constipation.                           - Monitor Hgb  and iron studies going forward.                           - No repeat colonoscopy due to age.                           - Follow-up with Dr. Havery Moros ([facility]) after                            discharge. Procedure Code(s):        --- Professional ---                           [phone], Colonoscopy, flexible; diagnostic, including                            collection of specimen(s) by brushing or washing,                            when performed (separate procedure) Diagnosis Code(s):        --- Professional ---                           K64.9, Unspecified hemorrhoids                           D50.9, Iron deficiency anemia, unspecified  K57.30, Diverticulosis of large intestine without                            perforation or abscess without bleeding CPT copyright 2017 American Medical Association. All rights reserved. The codes documented in this report are preliminary and upon coder review may  be revised to meet current compliance requirements. Jerene Bears, MD 07/19/2017 1:05:30 PM This report has been signed electronically. Number of Addenda: 0

## 2017-07-19 NOTE — Progress Notes (Signed)
EGD/colonoscopy performed; see reports for details I spoke to the patient's daughter and updated her regarding findings at endoscopies and GI plan (daughter [name]). Plan for once daily PPI, begin MiraLAX for chronic constipation and replace iron.  IV iron ordered today She should follow-up with Dr. Havery Moros GI will sign off, call with questions

## 2017-07-19 NOTE — Anesthesia Preprocedure Evaluation (Addendum)
Anesthesia Evaluation  Patient identified by MRN, date of birth, ID band Patient awake    Reviewed: Allergy & Precautions, NPO status , Patient's Chart, lab work & pertinent test results, reviewed documented beta blocker date and time   Airway Mallampati: II  TM Distance: >3 FB Neck ROM: Full    Dental  (+) Dental Advisory Given   Pulmonary neg pulmonary ROS,    breath sounds clear to auscultation       Cardiovascular hypertension, Pt. on medications and Pt. on home beta blockers + Cardiac Defibrillator + Valvular Problems/Murmurs MR  Rhythm:Regular Rate:Normal + Systolic murmurs '19 TTE - EF 50% to 55%. Grade 2 diastolic dysfunction. Mild-mod MR. Left atrium was moderately dilated. PASP was mildly increased. PA peak pressure: 36 mmHg.  Prolonged QTc  AICD in place for hx of ventricular tachycardia per note   Neuro/Psych Seizures -,  CVA, No Residual Symptoms negative psych ROS   GI/Hepatic Neg liver ROS, hiatal hernia, GERD  Controlled and Medicated,  Endo/Other  diabetes  Renal/GU CRFRenal disease  negative genitourinary   Musculoskeletal  (+) Arthritis ,   Abdominal   Peds  Hematology  (+) anemia ,   Anesthesia Other Findings Basal cell ca  Reproductive/Obstetrics                            Anesthesia Physical Anesthesia Plan  ASA: III  Anesthesia Plan: MAC   Post-op Pain Management:    Induction: Intravenous  PONV Risk Score and Plan: Propofol infusion and Treatment may vary due to age or medical condition  Airway Management Planned: Nasal Cannula and Natural Airway  Additional Equipment: None  Intra-op Plan:   Post-operative Plan:   Informed Consent: I have reviewed the patients History and Physical, chart, labs and discussed the procedure including the risks, benefits and alternatives for the proposed anesthesia with the patient or authorized representative who has  indicated his/her understanding and acceptance.     Plan Discussed with: CRNA and Anesthesiologist  Anesthesia Plan Comments: (Patient ok for CPR and intubation if cause of arrest deemed reversible.)       Anesthesia Quick Evaluation

## 2017-07-19 NOTE — Op Note (Signed)
[facility] Patient Name: Brenda [name] Procedure Date : 07/19/2017 MRN: [id number] Attending MD: Jerene Bears , MD Date of Birth: 1931/04/19 CSN: [id number] Age: 84 Admit Type: Inpatient Procedure:                Upper GI endoscopy Indications:              Iron deficiency anemia, history of dysphagia with                            esophageal dilations, last EGD 02/2017 Providers:                Lajuan Lines. Hilarie Fredrickson, MD, Baird Cancer, RN, Alan Mulder,                            Technician Referring MD:             [facility] Medicines:                Monitored Anesthesia Care Complications:            No immediate complications. Estimated Blood Loss:     Estimated blood loss: none. Procedure:                Pre-Anesthesia Assessment:                           - Prior to the procedure, a History and Physical                            was performed, and patient medications and                            allergies were reviewed. The patient's tolerance of                            previous anesthesia was also reviewed. The risks                            and benefits of the procedure and the sedation                            options and risks were discussed with the patient.                            All questions were answered, and informed consent                            was obtained. Prior Anticoagulants: The patient has                            taken Xarelto (rivaroxaban), last dose was 2 days                            prior to procedure. ASA Grade Assessment: III - A  patient with severe systemic disease. After                            reviewing the risks and benefits, the patient was                            deemed in satisfactory condition to undergo the                            procedure.                           After obtaining informed consent, the endoscope was                            passed under  direct vision. Throughout the                            procedure, the patient's blood pressure, pulse, and                            oxygen saturations were monitored continuously. The                            [id number]-[id number] ([id number]) scope was introduced through the                            mouth, and advanced to the second part of duodenum.                            The upper GI endoscopy was accomplished without                            difficulty. The patient tolerated the procedure                            well. Scope In: Scope Out: Findings:      The middle third of the esophagus and lower third of the esophagus were       tortuous consistent with esophageal dysmotility. The esophagus is       foreshortened.      A large, 5 cm, hiatal hernia with a few Cameron erosions was found.       Cameron's lesions are in the gastric cardia, superficial and not       actively bleeding. The proximal extent of the gastric folds (end of       tubular esophagus) was 29 cm from the incisors. The hiatal narrowing was       34 cm from the incisors. The Z-line was 29 cm from the incisors. There       is a low-grade narrowing Schatzki's ring at the GE junction.      The exam of the stomach was otherwise normal.      3 small to medium-sized non-bleeding diverticula was found in the second       portion of the duodenum (1) and at the major papilla (2).  The exam of the duodenum was otherwise normal. Impression:               - Tortuous esophagus.                           - Large hiatal hernia with a few Cameron's                            erosions. Could be an intermittent source of slow                            blood loss and eventual iron deficiency.                           - Non-bleeding duodenal diverticula                           - No specimens collected. Moderate Sedation:      N/A Recommendation:           - Return patient to hospital ward for ongoing care.                            - Soft diet as tolerated.                           - Daily PPI.                           - Reflux precautions.                           - See colonoscopy report to follow. Procedure Code(s):        --- Professional ---                           [phone], Esophagogastroduodenoscopy, flexible,                            transoral; diagnostic, including collection of                            specimen(s) by brushing or washing, when performed                            (separate procedure) Diagnosis Code(s):        --- Professional ---                           Q39.9, Congenital malformation of esophagus,                            unspecified                           K44.9, Diaphragmatic hernia without obstruction or  gangrene                           K25.9, Gastric ulcer, unspecified as acute or                            chronic, without hemorrhage or perforation                           D50.9, Iron deficiency anemia, unspecified                           K57.10, Diverticulosis of small intestine without                            perforation or abscess without bleeding CPT copyright 2017 American [employer] Association. All rights reserved. The codes documented in this report are preliminary and upon coder review may  be revised to meet current compliance requirements. Jerene Bears, MD 07/19/2017 12:31:20 PM This report has been signed electronically. Number of Addenda: 0

## 2017-07-19 NOTE — Transfer of Care (Signed)
Immediate Anesthesia Transfer of Care Note  Patient: Brenda [name]  Procedure(s) Performed: COLONOSCOPY WITH PROPOFOL (N/A ) ESOPHAGOGASTRODUODENOSCOPY (EGD) WITH PROPOFOL (N/A )  Patient Location: Endoscopy Unit  Anesthesia Type:MAC  Level of Consciousness: awake, alert  and oriented  Airway & Oxygen Therapy: Patient Spontanous Breathing and Patient connected to nasal cannula oxygen  Post-op Assessment: Report given to RN and Post -op Vital signs reviewed and stable  Post vital signs: Reviewed and stable  Last Vitals:  Vitals Value Taken Time  BP    Temp    Pulse    Resp    SpO2      Last Pain:  Vitals:   07/19/17 1112  TempSrc: Oral  PainSc: 0-No pain      Patients Stated Pain Goal: 3 (25/83/46 2194)  Complications: No apparent anesthesia complications

## 2017-07-19 NOTE — Evaluation (Signed)
Clinical/Bedside Swallow Evaluation Patient Details  Name: Brenda [name] MRN: [id number] Date of Birth: Aug 17, 1931  Today's Date: 07/19/2017 Time: SLP Start Time (ACUTE ONLY): 23 SLP Stop Time (ACUTE ONLY): 1642 SLP Time Calculation (min) (ACUTE ONLY): 16 min  Past Medical History:  Past Medical History:  Diagnosis Date  . Anemia   . Arthritis   . Basal cell carcinoma    nose  . Diabetes mellitus without complication ([place])    pt denies diabetes  . GERD (gastroesophageal reflux disease)   . Hiatal hernia   . HLD (hyperlipidemia)   . Hypertension   . Seizure ([place]) 2011   Past Surgical History:  Past Surgical History:  Procedure Laterality Date  . CARDIAC DEFIBRILLATOR PLACEMENT    . CATARACT EXTRACTION Left 02/2001  . CHOLECYSTECTOMY    . LUMBAR LAMINECTOMY  06/11/2001  . LUMBAR LAMINECTOMY  02/09/2006  . LUMBAR LAMINECTOMY  07/07/2006   with spianal cord stimulator  . SPINAL CORD STIMULATOR REMOVAL  14/2009  . TUBAL LIGATION     HPI:  Pt is a 84 y.o.femalepresenting with fall, found to have symptomatic anemia. PMH is significant for T2DM, Paroxysmal Afib on xarelto (held), AICD in place, T2DM, HTN, esophageal dysmotility, hiatal hernia, Schatzki's ring s/p most recent dilation December 2018.   Assessment / Plan / Recommendation Clinical Impression  Pt begins to have delayed throat clearing and coughing as solid trials progress, but her swallow appears swift and her voice remains clear. She describes subjective symptoms such as solids getting "stuck", globus sensation, heartburn. Her CXR on admission is clear without concern for PNA. Overall I suspect that this is primarily related to her known esophageal issues, which were confirmed further by GI testing earlier today. Would continue with soft diet and thin liquids with potential advancement per GI. SLP reviewed esophageal strategies with pt to try to mitigate symptoms as well as risk for any post-prandial aspiration.  SLP will f/u briefly for reinforcement and utilization of strategies while pt remains in house, but no additional f/u post-discharge is anticipated. SLP Visit Diagnosis: Dysphagia, unspecified (R13.10)    Aspiration Risk  Mild aspiration risk    Diet Recommendation Dysphagia 3 (Mech soft);Thin liquid   Liquid Administration via: Cup;Straw Medication Administration: Whole meds with liquid Supervision: Patient able to self feed;Intermittent supervision to cue for compensatory strategies Compensations: Slow rate;Small sips/bites;Follow solids with liquid Postural Changes: Seated upright at 90 degrees;Remain upright for at least 30 minutes after po intake    Other  Recommendations Oral Care Recommendations: Oral care BID   Follow up Recommendations None      Frequency and Duration min 1 x/week  1 week       Prognosis Prognosis for Safe Diet Advancement: Fair Barriers to Reach Goals: Other (Comment)(primary GI issues)      Swallow Study   General HPI: Pt is a 84 y.o.femalepresenting with fall, found to have symptomatic anemia. PMH is significant for T2DM, Paroxysmal Afib on xarelto (held), AICD in place, T2DM, HTN, esophageal dysmotility, hiatal hernia, Schatzki's ring s/p most recent dilation December 2018. Type of Study: Bedside Swallow Evaluation Previous Swallow Assessment: none in chart Diet Prior to this Study: Dysphagia 3 (soft);Thin liquids Temperature Spikes Noted: No Respiratory Status: Room air History of Recent Intubation: No Behavior/Cognition: Alert;Cooperative;Pleasant mood Oral Cavity Assessment: Within Functional Limits Oral Care Completed by SLP: No Oral Cavity - Dentition: Adequate natural dentition Vision: Functional for self-feeding Self-Feeding Abilities: Able to feed self Patient Positioning: Upright in bed Baseline Vocal Quality:  Normal;Other (comment)(pt reports intermittent hoarseness) Volitional Cough: Strong Volitional Swallow: Able to elicit     Oral/Motor/Sensory Function Overall Oral Motor/Sensory Function: Within functional limits   Ice Chips Ice chips: Not tested   Thin Liquid Thin Liquid: Within functional limits Presentation: Self Fed;Straw    Nectar Thick Nectar Thick Liquid: Not tested   Honey Thick Honey Thick Liquid: Not tested   Puree Puree: Impaired Presentation: Self Fed;Spoon Pharyngeal Phase Impairments: Cough - Delayed   Solid   GO   Solid: Impaired Presentation: Self Fed Pharyngeal Phase Impairments: Cough - Delayed        Avonell, Lenig 07/19/2017,5:31 PM   Germain Osgood, M.A. CCC-SLP [phone]

## 2017-07-19 NOTE — Progress Notes (Signed)
MEDICATION RELATED CONSULT NOTE - INITIAL   Pharmacy Consult for Iron Indication: Iron deficiency anemia  Allergies  Allergen Reactions  . Sotalol Other (See Comments)    Seizures   . Erythromycin Nausea Only  . Augmentin [Amoxicillin-Pot Clavulanate] Rash  . Crestor [Rosuvastatin Calcium] Rash  . Keflex [Cephalexin] Rash  . Septra [Sulfamethoxazole-Trimethoprim] Rash    Patient Measurements: Height: 5\' 1"  (154.9 cm) Weight: 136 lb 0.4 oz (61.7 kg) IBW/kg (Calculated) : 47.8  Assessment: 84 yo F with iron deficiency anemia. Tsat was 5 and ferritin low at 13. Hgb back up to 9.5 after transfusions and plts wnl.  Plan:  Give Feraheme 510mg  IV x 1 today Monitor iron labs F/U and consider another dose of Feraheme after 4/27 to complete 1g load  Elenor Quinones, PharmD, BCPS Clinical Pharmacist Pager [phone] 07/19/2017 1:44 PM

## 2017-07-20 LAB — BASIC METABOLIC PANEL WITH GFR
Anion gap: 9 (ref 5–15)
BUN: 19 mg/dL (ref 6–20)
CO2: 18 mmol/L — ABNORMAL LOW (ref 22–32)
Calcium: 8.7 mg/dL — ABNORMAL LOW (ref 8.9–10.3)
Chloride: 108 mmol/L (ref 101–111)
Creatinine, Ser: 1.41 mg/dL — ABNORMAL HIGH (ref 0.44–1.00)
GFR calc Af Amer: 39 mL/min — ABNORMAL LOW
GFR calc non Af Amer: 33 mL/min — ABNORMAL LOW
Glucose, Bld: 155 mg/dL — ABNORMAL HIGH (ref 65–99)
Potassium: 4 mmol/L (ref 3.5–5.1)
Sodium: 135 mmol/L (ref 135–145)

## 2017-07-20 LAB — CBC
HCT: 29.2 % — ABNORMAL LOW (ref 36.0–46.0)
Hemoglobin: 9.2 g/dL — ABNORMAL LOW (ref 12.0–15.0)
MCH: 26.5 pg (ref 26.0–34.0)
MCHC: 31.5 g/dL (ref 30.0–36.0)
MCV: 84.1 fL (ref 78.0–100.0)
Platelets: 239 10*3/uL (ref 150–400)
RBC: 3.47 MIL/uL — ABNORMAL LOW (ref 3.87–5.11)
RDW: 16.3 % — ABNORMAL HIGH (ref 11.5–15.5)
WBC: 7.4 10*3/uL (ref 4.0–10.5)

## 2017-07-20 MED ORDER — POLYETHYLENE GLYCOL 3350 17 G PO PACK: 17.0000 g | PACK | Freq: Every day | ORAL | 0 refills | Status: DC

## 2017-07-20 MED ORDER — RIVAROXABAN 15 MG PO TABS
15.0000 mg | ORAL_TABLET | Freq: Every day | ORAL | Status: DC
  Administered 2017-07-20: 15 mg via ORAL
  Filled 2017-07-20: qty 1

## 2017-07-20 NOTE — Discharge Instructions (Signed)
Gastrointestinal Bleeding °Gastrointestinal bleeding is bleeding somewhere along the path food travels through the body (digestive tract). This path is anywhere between the mouth and the opening of the butt (anus). You may have blood in your poop (stools) or have black poop. If you throw up (vomit), there may be blood in it. °This condition can be mild, serious, or even life-threatening. If you have a lot of bleeding, you may need to stay in the hospital. °Follow these instructions at home: °· Take over-the-counter and prescription medicines only as told by your doctor. °· Eat foods that have a lot of fiber in them. These foods include whole grains, fruits, and vegetables. You can also try eating 1-3 prunes each day. °· Drink enough fluid to keep your pee (urine) clear or pale yellow. °· Keep all follow-up visits as told by your doctor. This is important. °Contact a doctor if: °· Your symptoms do not get better. °Get help right away if: °· Your bleeding gets worse. °· You feel dizzy or you pass out (faint). °· You feel weak. °· You have very bad cramps in your back or belly (abdomen). °· You pass large clumps of blood (clots) in your poop. °· Your symptoms are getting worse. °This information is not intended to replace advice given to you by your health care provider. Make sure you discuss any questions you have with your health care provider. °Document Released: 12/22/2007 Document Revised: 08/20/2015 Document Reviewed: 09/01/2014 °Elsevier Interactive Patient Education © 2018 [employer] Inc. ° °

## 2017-07-20 NOTE — Progress Notes (Addendum)
Family Medicine Teaching Service Daily Progress Note Intern Pager: [phone]  Patient name: Brenda [name] Medical record number: [id number] Date of birth: Feb 07, 1932 Age: 84 y.o. Gender: female  Primary Care Provider: Sela Hilding, MD Consultants: GI Code Status: DNR/DNI confirmed on admission with family at bedside  Pt Overview and Major Events to Date:  Brenda [name] is a 84 y.o. female presenting with fall, found to have symptomatic anemia . PMH is significant for T2DM, Paroxysmal Afib on xarelto (held), AICD in place, T2DM, HTN  Assessment and Plan:  Acute on chronic anemia secondary to likely GI bleed., improved, stable Hg on admission 6.3 > stable in 9s after receiveing  2 units PRBC.  EGD and colonoscopy on 4/24.  EGD Showed  cameron's erosions, likely source of bleeding (slow)  And ireon deficient symptomatic anemia.  Colonscopy negative for significant pathology Received IV ferraheme 510 mg   Outpatient f/u recommended with repeat irron studies and could receive second dose IV ferraheme in 3 weeks if indicated. D/c oral iron.   Contiue PPI and decrease ppi to daily - Fall   Likely multifactorial with most significant factor low hemoglobin.  HFpEF, G2DD ECHO showed EF 55-60%. Calcified AV w/o stenosis. Mild to mod MR.  Hemodynamically stable  cont home lopressor   Dysphagia Bedside swallow recommended dysphagia 3 diet.   Paroxysmal afib -  Rate controlled on home metoprolol and verapamil.  HTN    SBP 150/6 whish I think is good goal given her wiide pulse pressure and age  - cont home metoprolol and verapamil   Constipation - daily miralax  FEN/GI: Dysphagia 3 Prophylaxis: SCDs  During hospital stay, restart anticoagulation either prior to discharge today or tomorrow  Disposition: Likely home today after eval by PT  Subjective:  Patient feels better. No sx of Sob, dizziness. Pt has been ambulating some to go to the bathroom.    Objective: Temp:  [97.7 F (36.5 C)-98.8 F (37.1 C)] 98.2 F (36.8 C) (04/25 0734) Pulse Rate:  [68-78] 68 (04/25 0734) Resp:  [14-20] 20 (04/25 0734) BP: (124-156)/(52-68) 152/68 (04/25 0734) SpO2:  [97 %-100 %] 97 % (04/25 0734) Physical Exam: Gen: NAD, resting comfortably CV: RRR, II/VI systolic murmer heard best at left lower sternal border Pulm: NWOB, CTAB with no crackles, wheezes, or rhonchi GI: Normal bowel sounds present. Soft, Nontender, Nondistended. MSK: no edema, cyanosis, or clubbing noted Skin: warm, dry Neuro: grossly normal, moves all extremities Psych: Normal affect and thought content  Laboratory: Recent Labs  Lab 07/19/17 0151 07/19/17 0709 07/20/17 0356  WBC 7.8 7.0 7.4  HGB 10.2* 9.5* 9.2*  HCT 32.8* 30.4* 29.2*  PLT 274 248 239   Recent Labs  Lab 07/18/17 0241 07/19/17 0709 07/20/17 0356  NA 136 137 135  K 3.6 4.0 4.0  CL 105 107 108  CO2 21* 17* 18*  BUN 18 17 19   CREATININE 1.29* 1.43* 1.41*  CALCIUM 8.7* 8.4* 8.7*  GLUCOSE 126* 125* 155*    Brenda Hollow, MD 07/20/2017, 8:30 AM PGY-1, [place] Intern pager: [phone], text pages welcome  [place] PAGER [phone] for any questions or notifications regarding this patient   FMTS Attending Daily Note: Dorcas Mcmurray MD  Attending pager:[phone]  office [phone]  I  have seen and examined this patient, reviewed their chart. I have discussed this patient with the resident. I agree with the resident's findings, assessment and care plan. I have amended Dr. Biagio Borg note for clarity.

## 2017-07-20 NOTE — Evaluation (Signed)
Physical Therapy Evaluation Patient Details Name: Brenda Brenda [name] MRN: [id number] DOB: 1931-04-19 Today's Date: 07/20/2017   History of Present Illness  84yo female s/p fall found to also have symptomatic anemia. All imaging negative for fracture or acute bleeding. PMH anemia, OA, DM, HTN, seiz, cardiac defibrillator placement, lumbar laminectomy, s/p spinal cord stimulator removal   Clinical Impression   Patient received up in chair, pleasant and willing to participate in Brenda [name] evaluation today. Of note, she and her husband endorse a significant kyphotic posture with difficulty in actively correcting postural impairments, also ongoing back pain; encouraged patient and husband to speak to MDs here and/or PCP about potential screen for compression fractures given severity and chronicity of postural impairments. She is able to perform functional transfers with Min guard and gait approximately 50f with RW and deviations as noted below, MinA for safe use of assistive device during gait and multiple rest breaks required. She was left up in chair with all needs met, family present this afternoon. She will continue to benefit from skilled Brenda [name] services in the acute setting as well as skilled HHPT services to further address functional deficits moving forward.     Follow Up Recommendations Home health Brenda [name]    Equipment Recommendations  Rolling walker with 5" wheels    Recommendations for Other Services       Precautions / Restrictions Precautions Precautions: Fall Restrictions Weight Bearing Restrictions: No      Mobility  Bed Mobility               General bed mobility comments: DNT, received up in chair   Transfers Overall transfer level: Needs assistance Equipment used: Rolling walker (2 wheeled) Transfers: Sit to/from Stand Sit to Stand: Min guard         General transfer comment: min guard for safety, extended time and effort, multiple attempts and use of momentum required  for sit to stand   Ambulation/Gait Ambulation/Gait assistance: Min assist Ambulation Distance (Feet): 80 Feet Assistive device: Rolling walker (2 wheeled) Gait Pattern/deviations: Step-through pattern;Decreased step length - right;Decreased step length - left;Decreased stride length;Narrow base of support;Drifts right/left;Trunk flexed     General Gait Details: patient with difficulty in maintaining upright posture due to severe kyphosis and difficulty with safe use of RW requring MinA to maintain safety. Multiple standing rest breaks required   Stairs            Wheelchair Mobility    Modified Rankin (Stroke Patients Only)       Balance Overall balance assessment: Needs assistance Sitting-balance support: Bilateral upper extremity supported;Feet supported Sitting balance-Leahy Scale: Fair Sitting balance - Comments: poor trunk strength    Standing balance support: Bilateral upper extremity supported;During functional activity Standing balance-Leahy Scale: Poor Standing balance comment: heavy reliance on B UEs on RW                              Pertinent Vitals/Pain Pain Assessment: No/denies pain    Home Living Family/patient expects to be discharged to:: Private residence Living Arrangements: Spouse/significant other;Children(daughter ) Available Help at Discharge: Family;Available 24 hours/day(daughter works full time, spouse available to call 911 ) Type of Home: House Home Access: Stairs to enter Entrance Stairs-Rails: None Entrance Stairs-Number of Steps: 1 Home Layout: Multi-level;Able to live on main level with bedroom/bathroom Home Equipment: WGilford Rile- 2 wheels;Shower seat Additional Comments: daughter cares for patient and spouse but works full time. Cat lives in the house  but tends to stay on the second levle of the home    Prior Function Level of Independence: Needs assistance   Gait / Transfers Assistance Needed: walker  ADL's / Homemaking  Assistance Needed: normally cooks breakfast and completes showers alone        Hand Dominance   Dominant Hand: Right    Extremity/Trunk Assessment   Upper Extremity Assessment Upper Extremity Assessment: Defer to OT evaluation    Lower Extremity Assessment Lower Extremity Assessment: Generalized weakness    Cervical / Trunk Assessment Cervical / Trunk Assessment: Kyphotic  Communication   Communication: HOH  Cognition Arousal/Alertness: Awake/alert Behavior During Therapy: WFL for tasks assessed/performed Overall Cognitive Status: Impaired/Different from baseline Area of Impairment: Safety/judgement;Awareness;Memory                     Memory: Decreased short-term memory   Safety/Judgement: Decreased awareness of deficits Awareness: Emergent   General Comments: Brenda [name] with changes visualized by therapist but unreported by patient. Brenda [name] does not alert you to changes and therapist must rely on visual attention to the pateint at all times      General Comments General comments (skin integrity, edema, etc.): patient endorses ongoing back pain in kyphotic curve as well as difficulty standing up straight; encouraged patient to speak to MDs here and/or primary PCP about possible screen for possible  compression fractures given severity and chronicity of postural impairment     Exercises     Assessment/Plan    Brenda [name] Assessment Patient needs continued Brenda [name] services  Brenda [name] Problem List Decreased strength;Decreased mobility;Decreased safety awareness;Decreased coordination;Decreased activity tolerance;Decreased balance;Decreased knowledge of use of DME;Pain       Brenda [name] Treatment Interventions DME instruction;Therapeutic activities;Gait training;Therapeutic exercise;Patient/family education;Stair training;Balance training;Functional mobility training;Neuromuscular re-education    Brenda [name] Goals (Current goals can be found in the Care Plan section)  Acute Rehab Brenda [name] Goals Patient Stated Goal:  to go home  Brenda [name] Goal Formulation: With patient Time For Goal Achievement: 08/03/17 Potential to Achieve Goals: Fair    Frequency Min 3X/week   Barriers to discharge        Co-evaluation               AM-PAC Brenda [name] "6 Clicks" Daily Activity  Outcome Measure Difficulty turning over in bed (including adjusting bedclothes, sheets and blankets)?: A Little Difficulty moving from lying on back to sitting on the side of the bed? : A Little Difficulty sitting down on and standing up from a chair with arms (e.g., wheelchair, bedside commode, etc,.)?: A Little Help needed moving to and from a bed to chair (including a wheelchair)?: A Little Help needed walking in hospital room?: A Little Help needed climbing 3-5 steps with a railing? : A Lot 6 Click Score: 17    End of Session   Activity Tolerance: Patient tolerated treatment well Patient left: in chair;with call bell/phone within reach;with family/visitor present   Brenda [name] Visit Diagnosis: Unsteadiness on feet (R26.81);Muscle weakness (generalized) (M62.81);History of falling (Z91.81);Difficulty in walking, not elsewhere classified (R26.2)    Time: 0786-7544 Brenda [name] Time Calculation (min) (ACUTE ONLY): 18 min   Charges:   Brenda [name] Evaluation $Brenda [name] Eval Moderate Complexity: 1 Mod     Brenda [name] G Codes:        Deniece Ree Brenda [name], DPT, CBIS  Supplemental Physical Therapist [place]   Pager [phone]

## 2017-07-20 NOTE — Care Management Note (Signed)
Case Management Note  Patient Details  Name: Brenda [name] MRN: [id number] Date of Birth: Aug 28, 1931  Subjective/Objective:  From home with spouse, presents with symptomatic anemia, hx of afib on xarelto pta, Gi consulted, per pt/ot eval rec hhpt/hhot.  NCM offered choice for HHPT, OT, ST and aide, patient states she does not want aide  Or speech, which speech did not rec any follow up.  She chose [facility] from the [facility] agency list.  Referral given to [name]  For HHPT/HHOT, soc will begin 24-48 hrs post dc.  Patient states she need a rolling walker because the one she has had for twenty years has broken.                     Action/Plan: DC home with HHPT/HHOT and rolling walker.  Expected Discharge Date:  07/20/17               Expected Discharge Plan:  [place]  In-House Referral:     Discharge planning Services  CM Consult  Post Acute Care Choice:  Home Health, Durable Medical Equipment Choice offered to:  Patient  DME Arranged:  Walker rolling DME Agency:  [place] Arranged:  PT, OT [facility] Agency:  [place]  Status of Service:  Completed, signed off  If discussed at [place] of Stay Meetings, dates discussed:    Additional Comments:  Zenon Mayo, RN 07/20/2017, 1:12 PM

## 2017-07-20 NOTE — Progress Notes (Signed)
Occupational Therapy Treatment Patient Details Name: Brenda [name] MRN: [id number] DOB: [birth year] Today's Date: 07/20/2017    History of present illness 84yo female s/p fall found to also have symptomatic anemia. All imaging negative for fracture or acute bleeding. PMH anemia, OA, DM, HTN, seiz, cardiac defibrillator placement, lumbar laminectomy, s/p spinal cord stimulator removal    OT comments  Pt distracted by concerns that he husband is having difficulty walking. Demonstrated ability to perform dressing with set up to min assist, toileting and grooming with min guard assist. Plans to sit to shower at home. Agreeable to family assisting with shower transfer for safety.   Follow Up Recommendations  Home health OT;Supervision/Assistance - 24 hour    Equipment Recommendations  None recommended by OT    Recommendations for Other Services      Precautions / Restrictions Precautions Precautions: Fall Restrictions Weight Bearing Restrictions: No       Mobility Bed Mobility Overal bed mobility: Modified Independent             General bed mobility comments: increased time, no physical assist, encouraged log roll method to protect back  Transfers Overall transfer level: Needs assistance Equipment used: Rolling walker (2 wheeled) Transfers: Sit to/from Stand Sit to Stand: Min guard         General transfer comment: min guard with momentum and increased time/effort    Balance Overall balance assessment: Needs assistance Sitting-balance support: Bilateral upper extremity supported;Feet supported Sitting balance-Leahy Scale: Fair Sitting balance - Comments: min to min guard assist when asked to don and doff socks   Standing balance support: Bilateral upper extremity supported;During functional activity Standing balance-Leahy Scale: Poor Standing balance comment: reliance on B UEs on RW                            ADL either performed or assessed with  clinical judgement   ADL Overall ADL's : Needs assistance/impaired     Grooming: Wash/dry hands;Standing;Supervision/safety           Upper Body Dressing : Set up;Sitting   Lower Body Dressing: Sit to/from stand;Minimal assistance   Toilet Transfer: Min guard;Ambulation;RW   Toileting- Clothing Manipulation and Hygiene: Minimal assistance;Sit to/from stand       Functional mobility during ADLs: Passenger transport manager     Praxis      Cognition Arousal/Alertness: Awake/alert Behavior During Therapy: WFL for tasks assessed/performed Overall Cognitive Status: Impaired/Different from baseline Area of Impairment: Safety/judgement;Awareness;Memory                     Memory: Decreased short-term memory   Safety/Judgement: Decreased awareness of deficits Awareness: Emergent   General Comments: pt more concerned with husband's health issues and concerns he could not ambulate to the car        Exercises     Shoulder Instructions       General Comments patient endorses ongoing back pain in kyphotic curve as well as difficulty standing up straight; encouraged patient to speak to MDs here and/or primary PCP about possible screen for possible  compression fractures given severity and chronicity of postural impairment     Pertinent Vitals/ Pain       Pain Assessment: Faces Faces Pain Scale: Hurts little more Pain Location: back Pain Descriptors / Indicators: Aching Pain Intervention(s): Monitored during session  Home Living Family/patient expects to  be discharged to:: Private residence Living Arrangements: Spouse/significant other;Children(daughter ) Available Help at Discharge: Family;Available 24 hours/day(daughter works full time, spouse available to call 911 ) Type of Home: House Home Access: Stairs to enter [employer] of Steps: 1 Entrance Stairs-Rails: None Home Layout: Multi-level;Able to live on main  level with bedroom/bathroom Alternate Level Stairs-Number of Steps: flight    Bathroom Shower/Tub: Occupational psychologist: Standard     Home Equipment: Environmental consultant - 2 wheels;Shower seat   Additional Comments: daughter cares for patient and spouse but works full time. Cat lives in the house but tends to stay on the second levle of the home      Prior Functioning/Environment Level of Independence: Needs assistance  Gait / Transfers Assistance Needed: walker ADL's / Homemaking Assistance Needed: normally cooks breakfast and completes showers alone Communication / Swallowing Assistance Needed: december 2018 with new difficulty swallowing- pt with pending follow up after procedure regarding swallowing     Frequency  Min 2X/week        Progress Toward Goals  OT Goals(current goals can now be found in the care plan section)  Progress towards OT goals: Progressing toward goals  Acute Rehab OT Goals Patient Stated Goal: to go home  OT Goal Formulation: With patient Time For Goal Achievement: 08/01/17 Potential to Achieve Goals: Good  Plan Discharge plan remains appropriate    Co-evaluation                 AM-PAC PT "6 Clicks" Daily Activity     Outcome Measure   Help from another person eating meals?: None Help from another person taking care of personal grooming?: A Little Help from another person toileting, which includes using toliet, bedpan, or urinal?: A Little Help from another person bathing (including washing, rinsing, drying)?: A Little Help from another person to put on and taking off regular upper body clothing?: None Help from another person to put on and taking off regular lower body clothing?: A Little 6 Click Score: 20    End of Session Equipment Utilized During Treatment: Gait belt;Rolling walker  OT Visit Diagnosis: Unsteadiness on feet (R26.81);History of falling (Z91.81)   Activity Tolerance Patient tolerated treatment well   Patient  Left in bed;with call bell/phone within reach;with family/visitor present   Nurse Communication          Time: 8099-8338 OT Time Calculation (min): 13 min  Charges: OT General Charges $OT Visit: 1 Visit OT Treatments $Self Care/Home Management : 8-22 mins  07/20/2017 Nestor Lewandowsky, OTR/L Pager: [phone]   Werner Lean, Haze Boyden 07/20/2017, 2:27 PM

## 2017-07-26 ENCOUNTER — Telehealth: Payer: Self-pay

## 2017-07-26 DIAGNOSIS — M47812 Spondylosis without myelopathy or radiculopathy, cervical region: Secondary | ICD-10-CM | POA: Diagnosis not present

## 2017-07-26 DIAGNOSIS — Z9181 History of falling: Secondary | ICD-10-CM | POA: Diagnosis not present

## 2017-07-26 DIAGNOSIS — E119 Type 2 diabetes mellitus without complications: Secondary | ICD-10-CM | POA: Diagnosis not present

## 2017-07-26 DIAGNOSIS — Z8673 Personal history of transient ischemic attack (TIA), and cerebral infarction without residual deficits: Secondary | ICD-10-CM | POA: Diagnosis not present

## 2017-07-26 DIAGNOSIS — K922 Gastrointestinal hemorrhage, unspecified: Secondary | ICD-10-CM | POA: Diagnosis not present

## 2017-07-26 DIAGNOSIS — I10 Essential (primary) hypertension: Secondary | ICD-10-CM | POA: Diagnosis not present

## 2017-07-26 DIAGNOSIS — Z85828 Personal history of other malignant neoplasm of skin: Secondary | ICD-10-CM | POA: Diagnosis not present

## 2017-07-26 DIAGNOSIS — Z95810 Presence of automatic (implantable) cardiac defibrillator: Secondary | ICD-10-CM | POA: Diagnosis not present

## 2017-07-26 DIAGNOSIS — M810 Age-related osteoporosis without current pathological fracture: Secondary | ICD-10-CM | POA: Diagnosis not present

## 2017-07-26 DIAGNOSIS — I480 Paroxysmal atrial fibrillation: Secondary | ICD-10-CM | POA: Diagnosis not present

## 2017-07-26 DIAGNOSIS — M1990 Unspecified osteoarthritis, unspecified site: Secondary | ICD-10-CM | POA: Diagnosis not present

## 2017-07-26 DIAGNOSIS — D500 Iron deficiency anemia secondary to blood loss (chronic): Secondary | ICD-10-CM | POA: Diagnosis not present

## 2017-07-26 NOTE — Telephone Encounter (Signed)
Ronalee Belts- PT with [facility] calling for verbal orders to see pt 1x week for 1 week, 2x week for 2 weeks. Also requesting an OT evaluation. Call back number for VO [phone] Wallace Cullens, RN

## 2017-07-27 ENCOUNTER — Ambulatory Visit (INDEPENDENT_AMBULATORY_CARE_PROVIDER_SITE_OTHER): Payer: Medicare Other | Admitting: Family Medicine

## 2017-07-27 ENCOUNTER — Other Ambulatory Visit: Payer: Self-pay

## 2017-07-27 VITALS — BP 126/74 | HR 68 | Temp 98.1°F | Ht 61.0 in | Wt 132.0 lb

## 2017-07-27 DIAGNOSIS — D649 Anemia, unspecified: Secondary | ICD-10-CM

## 2017-07-27 DIAGNOSIS — D500 Iron deficiency anemia secondary to blood loss (chronic): Secondary | ICD-10-CM | POA: Diagnosis not present

## 2017-07-27 DIAGNOSIS — M25512 Pain in left shoulder: Secondary | ICD-10-CM

## 2017-07-27 NOTE — Telephone Encounter (Signed)
LVM for Brenda [name] to call office back to inform him of below. Katharina Caper, Tobey Schmelzle D, [place]

## 2017-07-27 NOTE — Assessment & Plan Note (Signed)
Likely from patient's fall a few weeks ago.  Advised patient to avoid NSAIDs but that she can take up to 3 grams of Tylenol per day.  She should contact our clinic if it fails to improve with time and with Tylenol.

## 2017-07-27 NOTE — Telephone Encounter (Signed)
White team, please give verbal orders for this per me. Thanks.

## 2017-07-27 NOTE — Progress Notes (Signed)
   Subjective:    Brenda [name] - 84 y.o. female MRN [id number]  Date of birth: 1932-01-09  HPI  Brenda [name] is here for hospital follow up after being admitted in late April for symptomatic anemia found to be likely due to [facility] esophageal ulcers.  She says she has felt better since being home from the hospital, but she does have L shoulder and back pain.  Her L shoulder pain is worse during the night, she thinks it is worsened by lying on her left side during sleep.  She denies bloody or black stools and has been taking Miralax for constipation.  Health Maintenance:  Health Maintenance Due  Topic Date Due  . OPHTHALMOLOGY EXAM  10/26/1943  . URINE MICROALBUMIN  10/26/1943  . TETANUS/TDAP  10/25/1952  . PNA vac Low Risk Adult (1 of 2 - PCV13) 10/26/1998  . HEMOGLOBIN A1C  11/02/2016    -  reports that she has never smoked. She has never used smokeless tobacco. - Review of Systems: Per HPI. - Past Medical History: Patient Active Problem List   Diagnosis Date Noted  . Acute pain of left shoulder 07/27/2017  . GERD (gastroesophageal reflux disease) 07/18/2017  . Gastrointestinal hemorrhage   . Symptomatic anemia 07/17/2017  . Iron deficiency anemia 05/09/2017  . Osteoporosis 11/24/2016  . Skin cancer of nose 11/23/2016  . 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 UTI (urinary tract infection) 10/26/2016  . Paroxysmal A-fib ([place]) 08/05/2016  . AKI (acute kidney injury) ([place]) 05/25/2016  . Essential hypertension 05/05/2016  . Type 2 diabetes mellitus without complication, without long-term current use of insulin ([place]) 05/05/2016  . Hiatal hernia 05/05/2016  . History of CVA (cerebrovascular accident) without residual deficits 05/05/2016  . Memory change 05/05/2016  . AICD (automatic cardioverter/defibrillator) present 05/05/2016  . Back pain 05/05/2016   - Medications: reviewed and updated   Objective:   Physical Exam BP 126/74   Pulse 68   Temp 98.1 F (36.7 C)  (Oral)   Ht 5\' 1"  (1.549 m)   Wt 132 lb (59.9 kg)   SpO2 92%   BMI 24.94 kg/m  Gen: NAD, alert, cooperative with exam HEENT: NCAT, clear conjunctiva, fading bruise on forehead CV: RRR, good S1/S2, no murmur, no edema Resp: CTABL, no wheezes, non-labored Abd: SNTND, BS present, no guarding or organomegaly Skin: no rashes, normal turgor  Neuro: no gross deficits.  Musculoskeletal: somewhat limited ROM on L shoulder flexion, extension, and internal rotation, but no pain on movement.  No tenderness to palpation. Psych: good insight, alert and oriented        Assessment & Plan:   Iron deficiency anemia Anemia panel was drawn, and patient was placed on oral Fe supplementation (she already has a bottle at home).  She was advised that iron can cause constipation, so she may need to increase her intake of Miralax.  Acute pain of left shoulder Likely from patient's fall a few weeks ago.  Advised patient to avoid NSAIDs but that she can take up to 3 grams of Tylenol per day.  She should contact our clinic if it fails to improve with time and with Tylenol.    Maia Breslow, M.D. 07/27/2017, 4:09 PM PGY-1, [place]

## 2017-07-27 NOTE — Patient Instructions (Addendum)
It was nice meeting you today Brenda [name]!  Today, we drew labs to see how your blood counts are doing after coming home from the hospital.  We also decided to restart oral iron supplementation.  Please take Tylenol for your shoulder and back and avoid medications such as Aleve, Motrin, and Advil.  We will call you if your labs are abnormal or if we need to change any of your medications.  If you have any questions or concerns, please feel free to call the clinic.   Be well,  Dr. Shan Levans  Iron Deficiency Anemia, Adult Iron deficiency anemia is a condition in which the concentration of red blood cells or hemoglobin in the blood is below normal because of too little iron. Hemoglobin is a substance in red blood cells that carries oxygen to the body's tissues. When the concentration of red blood cells or hemoglobin is too low, not enough oxygen reaches these tissues. Iron deficiency anemia is usually long-lasting (chronic) and it develops over time. It may or may not cause symptoms. It is a common type of anemia. What are the causes? This condition may be caused by:  Not enough iron in the diet.  Blood loss caused by bleeding in the intestine.  Blood loss from a gastrointestinal condition like Crohn disease.  Frequent blood draws, such as from blood donation.  Abnormal absorption in the gut.  Heavy menstrual periods in women.  Cancers of the gastrointestinal system, such as colon cancer.  What are the signs or symptoms? Symptoms of this condition may include:  Fatigue.  Headache.  Pale skin, lips, and nail beds.  Poor appetite.  Weakness.  Shortness of breath.  Dizziness.  Cold hands and feet.  Fast or irregular heartbeat.  Irritability. This is more common in severe anemia.  Rapid breathing. This is more common in severe anemia.  Mild anemia may not cause any symptoms. How is this diagnosed? This condition is diagnosed based on:  Your medical  history.  A physical exam.  Blood tests.  You may have additional tests to find the underlying cause of your anemia, such as:  Testing for blood in the stool (fecal occult blood test).  A procedure to see inside your colon and rectum (colonoscopy).  A procedure to see inside your esophagus and stomach (endoscopy).  A test in which cells are removed from bone marrow (bone marrow aspiration) or fluid is removed from the bone marrow to be examined (biopsy). This is rarely needed.  How is this treated? This condition is treated by correcting the cause of your iron deficiency. Treatment may involve:  Adding iron-rich foods to your diet.  Taking iron supplements. If you are pregnant or breastfeeding, you may need to take extra iron because your normal diet usually does not provide the amount of iron that you need.  Increasing vitamin C intake. Vitamin C helps your body absorb iron. Your health care provider may recommend that you take iron supplements along with a glass of orange juice or a vitamin C supplement.  Medicines to make heavy menstrual flow lighter.  Surgery.  You may need repeat blood tests to determine whether treatment is working. Depending on the underlying cause, the anemia should be corrected within 2 months of starting treatment. If the treatment does not seem to be working, you may need more testing. Follow these instructions at home: Medicines  Take over-the-counter and prescription medicines only as told by your health care provider. This includes iron supplements and vitamins.  If you cannot tolerate taking iron supplements by mouth, talk with your health care provider about taking them through a vein (intravenously) or an injection into a muscle.  For the best iron absorption, you should take iron supplements when your stomach is empty. If you cannot tolerate them on an empty stomach, you may need to take them with food.  Do not drink milk or take antacids at  the same time as your iron supplements. Milk and antacids may interfere with iron absorption.  Iron supplements can cause constipation. To prevent constipation, include fiber in your diet as told by your health care provider. A stool softener may also be recommended. Eating and drinking  Talk with your health care provider before changing your diet. He or she may recommend that you eat foods that contain a lot of iron, such as: ? Liver. ? Low-fat (lean) beef. ? Breads and cereals that have iron added to them (are fortified). ? Eggs. ? Dried fruit. ? Dark green, leafy vegetables.  To help your body use the iron from iron-rich foods, eat those foods at the same time as fresh fruits and vegetables that are high in vitamin C. Foods that are high in vitamin C include: ? Oranges. ? Peppers. ? Tomatoes. ? Mangoes.  Drinkenoughfluid to keep your urine clear or pale yellow. General instructions  Return to your normal activities as told by your health care provider. Ask your health care provider what activities are safe for you.  Practice good hygiene. Anemia can make you more prone to illness and infection.  Keep all follow-up visits as told by your health care provider. This is important. Contact a health care provider if:  You feel nauseous or you vomit.  You feel weak.  You have unexplained sweating.  You develop symptoms of constipation, such as: ? Having fewer than three bowel movements a week. ? Straining to have a bowel movement. ? Having stools that are hard, dry, or larger than normal. ? Feeling full or bloated. ? Pain in the lower abdomen. ? Not feeling relief after having a bowel movement. Get help right away if:  You faint. If this happens, do not drive yourself to the hospital. Call your local emergency services (911 in the [place]  You have chest pain.  You have shortness of breath that: ? Is severe. ? Gets worse with physical activity.  You have a rapid  heartbeat.  You become light-headed when getting up from a sitting or lying down position. This information is not intended to replace advice given to you by your health care provider. Make sure you discuss any questions you have with your health care provider. Document Released: 03/11/2000 Document Revised: 12/02/2015 Document Reviewed: 12/02/2015 Elsevier Interactive Patient Education  2018 [employer].

## 2017-07-27 NOTE — Assessment & Plan Note (Addendum)
Anemia panel was drawn, and patient was placed on oral Fe supplementation (she already has a bottle at home).  She was advised that iron can cause constipation, so she may need to increase her intake of Miralax.

## 2017-07-28 ENCOUNTER — Other Ambulatory Visit: Payer: Self-pay | Admitting: Family Medicine

## 2017-07-28 ENCOUNTER — Telehealth: Payer: Self-pay | Admitting: Family Medicine

## 2017-07-28 DIAGNOSIS — K922 Gastrointestinal hemorrhage, unspecified: Secondary | ICD-10-CM | POA: Diagnosis not present

## 2017-07-28 DIAGNOSIS — I10 Essential (primary) hypertension: Secondary | ICD-10-CM | POA: Diagnosis not present

## 2017-07-28 DIAGNOSIS — D649 Anemia, unspecified: Secondary | ICD-10-CM

## 2017-07-28 DIAGNOSIS — E119 Type 2 diabetes mellitus without complications: Secondary | ICD-10-CM | POA: Diagnosis not present

## 2017-07-28 DIAGNOSIS — M47812 Spondylosis without myelopathy or radiculopathy, cervical region: Secondary | ICD-10-CM | POA: Diagnosis not present

## 2017-07-28 DIAGNOSIS — I480 Paroxysmal atrial fibrillation: Secondary | ICD-10-CM | POA: Diagnosis not present

## 2017-07-28 DIAGNOSIS — D500 Iron deficiency anemia secondary to blood loss (chronic): Secondary | ICD-10-CM | POA: Diagnosis not present

## 2017-07-28 LAB — ANEMIA PANEL
Ferritin: 885 ng/mL — ABNORMAL HIGH (ref 15–150)
Folate, Hemolysate: 570.7 ng/mL
Folate, RBC: 1767 ng/mL (ref >498)
Hematocrit: 32.3 % — ABNORMAL LOW (ref 34.0–46.6)
Iron Saturation: 45 % (ref 15–55)
Iron: 122 ug/dL (ref 27–139)
Retic Ct Pct: 1.1 % (ref 0.6–2.6)
Total Iron Binding Capacity: 274 ug/dL (ref 250–450)
UIBC: 152 ug/dL (ref 118–369)
Vitamin B-12: 1598 pg/mL — ABNORMAL HIGH (ref 232–1245)

## 2017-07-28 NOTE — Telephone Encounter (Signed)
Called patient regarding her anemia panel and spoke with her daughter.  Since her anemia panel does not show iron deficiency, I advised that she stop taking iron supplementation.  She should come in to get a CBC in about a month, continue taking her PPI, and follow up with GI as scheduled.  Daughter was in agreement with this plan.

## 2017-07-31 DIAGNOSIS — E119 Type 2 diabetes mellitus without complications: Secondary | ICD-10-CM | POA: Diagnosis not present

## 2017-07-31 DIAGNOSIS — I480 Paroxysmal atrial fibrillation: Secondary | ICD-10-CM | POA: Diagnosis not present

## 2017-07-31 DIAGNOSIS — I10 Essential (primary) hypertension: Secondary | ICD-10-CM | POA: Diagnosis not present

## 2017-07-31 DIAGNOSIS — K922 Gastrointestinal hemorrhage, unspecified: Secondary | ICD-10-CM | POA: Diagnosis not present

## 2017-07-31 DIAGNOSIS — M47812 Spondylosis without myelopathy or radiculopathy, cervical region: Secondary | ICD-10-CM | POA: Diagnosis not present

## 2017-07-31 DIAGNOSIS — D500 Iron deficiency anemia secondary to blood loss (chronic): Secondary | ICD-10-CM | POA: Diagnosis not present

## 2017-08-01 NOTE — Telephone Encounter (Signed)
After contacting [place] the number that we had was incorrect.  The correct number for Brenda [name] with [facility] is [phone].  I contacted Brenda [name] and gave the verbal order he was requesting.  Brenda [name] stated that the order would come under Dr. Nori Riis cause that is who it was under. Katharina Caper, Lyndon Chapel D, [place]

## 2017-08-01 NOTE — Telephone Encounter (Signed)
Tried to contact Brenda [name] to give verbal order and a lady answered the phone and stated that this was not his phone number.  If Brenda [name] form [facility] calls back please give him the verbal order and get correct number. Katharina Caper, Morene Cecilio D, [place]

## 2017-08-02 DIAGNOSIS — I480 Paroxysmal atrial fibrillation: Secondary | ICD-10-CM | POA: Diagnosis not present

## 2017-08-02 DIAGNOSIS — I10 Essential (primary) hypertension: Secondary | ICD-10-CM | POA: Diagnosis not present

## 2017-08-02 DIAGNOSIS — M47812 Spondylosis without myelopathy or radiculopathy, cervical region: Secondary | ICD-10-CM | POA: Diagnosis not present

## 2017-08-02 DIAGNOSIS — E119 Type 2 diabetes mellitus without complications: Secondary | ICD-10-CM | POA: Diagnosis not present

## 2017-08-02 DIAGNOSIS — D500 Iron deficiency anemia secondary to blood loss (chronic): Secondary | ICD-10-CM | POA: Diagnosis not present

## 2017-08-02 DIAGNOSIS — K922 Gastrointestinal hemorrhage, unspecified: Secondary | ICD-10-CM | POA: Diagnosis not present

## 2017-08-03 ENCOUNTER — Ambulatory Visit (INDEPENDENT_AMBULATORY_CARE_PROVIDER_SITE_OTHER): Payer: Medicare Other | Admitting: *Deleted

## 2017-08-03 ENCOUNTER — Telehealth: Payer: Self-pay | Admitting: Cardiology

## 2017-08-03 DIAGNOSIS — I472 Ventricular tachycardia, unspecified: Secondary | ICD-10-CM

## 2017-08-03 NOTE — Progress Notes (Signed)
Remote ICD transmission.   

## 2017-08-03 NOTE — Telephone Encounter (Signed)
Confirmed remote transmission w/ pt son.    

## 2017-08-04 DIAGNOSIS — I10 Essential (primary) hypertension: Secondary | ICD-10-CM | POA: Diagnosis not present

## 2017-08-04 DIAGNOSIS — E119 Type 2 diabetes mellitus without complications: Secondary | ICD-10-CM | POA: Diagnosis not present

## 2017-08-04 DIAGNOSIS — K922 Gastrointestinal hemorrhage, unspecified: Secondary | ICD-10-CM | POA: Diagnosis not present

## 2017-08-04 DIAGNOSIS — I480 Paroxysmal atrial fibrillation: Secondary | ICD-10-CM | POA: Diagnosis not present

## 2017-08-04 DIAGNOSIS — D500 Iron deficiency anemia secondary to blood loss (chronic): Secondary | ICD-10-CM | POA: Diagnosis not present

## 2017-08-04 DIAGNOSIS — M47812 Spondylosis without myelopathy or radiculopathy, cervical region: Secondary | ICD-10-CM | POA: Diagnosis not present

## 2017-08-08 ENCOUNTER — Encounter: Payer: Self-pay | Admitting: Cardiology

## 2017-08-08 DIAGNOSIS — K922 Gastrointestinal hemorrhage, unspecified: Secondary | ICD-10-CM | POA: Diagnosis not present

## 2017-08-08 DIAGNOSIS — E119 Type 2 diabetes mellitus without complications: Secondary | ICD-10-CM | POA: Diagnosis not present

## 2017-08-08 DIAGNOSIS — I10 Essential (primary) hypertension: Secondary | ICD-10-CM | POA: Diagnosis not present

## 2017-08-08 DIAGNOSIS — D500 Iron deficiency anemia secondary to blood loss (chronic): Secondary | ICD-10-CM | POA: Diagnosis not present

## 2017-08-08 DIAGNOSIS — I480 Paroxysmal atrial fibrillation: Secondary | ICD-10-CM | POA: Diagnosis not present

## 2017-08-08 DIAGNOSIS — M47812 Spondylosis without myelopathy or radiculopathy, cervical region: Secondary | ICD-10-CM | POA: Diagnosis not present

## 2017-08-09 DIAGNOSIS — I10 Essential (primary) hypertension: Secondary | ICD-10-CM | POA: Diagnosis not present

## 2017-08-09 DIAGNOSIS — E119 Type 2 diabetes mellitus without complications: Secondary | ICD-10-CM | POA: Diagnosis not present

## 2017-08-09 DIAGNOSIS — K922 Gastrointestinal hemorrhage, unspecified: Secondary | ICD-10-CM | POA: Diagnosis not present

## 2017-08-09 DIAGNOSIS — M47812 Spondylosis without myelopathy or radiculopathy, cervical region: Secondary | ICD-10-CM | POA: Diagnosis not present

## 2017-08-09 DIAGNOSIS — D500 Iron deficiency anemia secondary to blood loss (chronic): Secondary | ICD-10-CM | POA: Diagnosis not present

## 2017-08-09 DIAGNOSIS — I480 Paroxysmal atrial fibrillation: Secondary | ICD-10-CM | POA: Diagnosis not present

## 2017-08-11 DIAGNOSIS — I10 Essential (primary) hypertension: Secondary | ICD-10-CM | POA: Diagnosis not present

## 2017-08-11 DIAGNOSIS — D500 Iron deficiency anemia secondary to blood loss (chronic): Secondary | ICD-10-CM | POA: Diagnosis not present

## 2017-08-11 DIAGNOSIS — K922 Gastrointestinal hemorrhage, unspecified: Secondary | ICD-10-CM | POA: Diagnosis not present

## 2017-08-11 DIAGNOSIS — E119 Type 2 diabetes mellitus without complications: Secondary | ICD-10-CM | POA: Diagnosis not present

## 2017-08-11 DIAGNOSIS — M47812 Spondylosis without myelopathy or radiculopathy, cervical region: Secondary | ICD-10-CM | POA: Diagnosis not present

## 2017-08-11 DIAGNOSIS — I480 Paroxysmal atrial fibrillation: Secondary | ICD-10-CM | POA: Diagnosis not present

## 2017-08-17 ENCOUNTER — Ambulatory Visit (INDEPENDENT_AMBULATORY_CARE_PROVIDER_SITE_OTHER): Payer: Medicare Other | Admitting: Gastroenterology

## 2017-08-17 ENCOUNTER — Other Ambulatory Visit (INDEPENDENT_AMBULATORY_CARE_PROVIDER_SITE_OTHER): Payer: Medicare Other

## 2017-08-17 ENCOUNTER — Encounter: Payer: Self-pay | Admitting: Gastroenterology

## 2017-08-17 VITALS — BP 134/64 | HR 80 | Ht 60.0 in | Wt 131.0 lb

## 2017-08-17 DIAGNOSIS — D509 Iron deficiency anemia, unspecified: Secondary | ICD-10-CM

## 2017-08-17 DIAGNOSIS — M47812 Spondylosis without myelopathy or radiculopathy, cervical region: Secondary | ICD-10-CM | POA: Diagnosis not present

## 2017-08-17 DIAGNOSIS — K5900 Constipation, unspecified: Secondary | ICD-10-CM

## 2017-08-17 DIAGNOSIS — K922 Gastrointestinal hemorrhage, unspecified: Secondary | ICD-10-CM | POA: Diagnosis not present

## 2017-08-17 DIAGNOSIS — E119 Type 2 diabetes mellitus without complications: Secondary | ICD-10-CM | POA: Diagnosis not present

## 2017-08-17 DIAGNOSIS — K449 Diaphragmatic hernia without obstruction or gangrene: Secondary | ICD-10-CM | POA: Diagnosis not present

## 2017-08-17 DIAGNOSIS — D500 Iron deficiency anemia secondary to blood loss (chronic): Secondary | ICD-10-CM | POA: Diagnosis not present

## 2017-08-17 DIAGNOSIS — I480 Paroxysmal atrial fibrillation: Secondary | ICD-10-CM | POA: Diagnosis not present

## 2017-08-17 DIAGNOSIS — I10 Essential (primary) hypertension: Secondary | ICD-10-CM | POA: Diagnosis not present

## 2017-08-17 LAB — CBC WITH DIFFERENTIAL/PLATELET
Basophils Absolute: 0.1 10*3/uL (ref 0.0–0.1)
Basophils Relative: 0.6 % (ref 0.0–3.0)
Eosinophils Absolute: 0.1 10*3/uL (ref 0.0–0.7)
Eosinophils Relative: 1.3 % (ref 0.0–5.0)
HCT: 30.8 % — ABNORMAL LOW (ref 36.0–46.0)
Hemoglobin: 10 g/dL — ABNORMAL LOW (ref 12.0–15.0)
Lymphocytes Relative: 23.1 % (ref 12.0–46.0)
Lymphs Abs: 2.1 10*3/uL (ref 0.7–4.0)
MCHC: 32.6 g/dL (ref 30.0–36.0)
MCV: 85.6 fl (ref 78.0–100.0)
Monocytes Absolute: 0.8 10*3/uL (ref 0.1–1.0)
Monocytes Relative: 8.9 % (ref 3.0–12.0)
Neutro Abs: 5.9 10*3/uL (ref 1.4–7.7)
Neutrophils Relative %: 66.1 % (ref 43.0–77.0)
Platelets: 171 10*3/uL (ref 150.0–400.0)
RBC: 3.6 Mil/uL — ABNORMAL LOW (ref 3.87–5.11)
RDW: 22.3 % — ABNORMAL HIGH (ref 11.5–15.5)
WBC: 8.9 10*3/uL (ref 4.0–10.5)

## 2017-08-17 NOTE — Progress Notes (Signed)
HPI :  84 year old female with a history of Xarelto use for atrial fibrillation, ICD implant stated history of ventricular tachycardia, history of iron deficiency anemia, here for a follow-up visit.  She had a history of dysphagia and had an endoscopy in December 2018 which showed hiatal hernia and distal esophageal stenosis which was dilated. She was unfortunately admitted to the hospital this past April with worsening iron deficiency anemia. Her hemoglobin nadired at 6.3 she received a few units of blood. She underwent an upper endoscopy and colonoscopy by Dr. Hilarie Fredrickson on April 24th. Colonoscopy failed to reveal any source of anemia. Her upper endoscopy was notable for a 5 cm hiatal hernia along with Lysbeth Galas lesions present. This was thought potentially be the source of her anemia in the setting of anticoagulation.  He had dose of omeprazole increased to 40 mg once a day and was given a dose of IV iron. It was also recommended that she continue oral iron at that time. She states she was told to stop oral iron by another provider. She was given MiraLAX is for constipation but states this may be too strong for her, she complains of loose stools when she uses it and is only using it as needed at this time. She states the higher dose of omeprazole is working to control her heartburn. She denies any dysphagia since the dilation. She denies any abdominal pains.  Prior endoscopic history: EGD 07/19/2017 - 5cm HH with Lysbeth Galas lesions, normal stomach, duodenal diverticulum Colonoscopy 07/19/2017 - nl TI, left sided diverticuli, no cause for iron deficiency    Past Medical History:  Diagnosis Date  . Anemia   . Arthritis   . Basal cell carcinoma    nose  . Diabetes mellitus without complication ([place])    pt denies diabetes  . GERD (gastroesophageal reflux disease)   . Hiatal hernia   . HLD (hyperlipidemia)   . Hypertension   . Seizure ([place]) 2011     Past Surgical History:  Procedure Laterality  Date  . CARDIAC DEFIBRILLATOR PLACEMENT    . CATARACT EXTRACTION Left 02/2001  . CHOLECYSTECTOMY    . COLONOSCOPY WITH PROPOFOL N/A 07/19/2017   Procedure: COLONOSCOPY WITH PROPOFOL;  Surgeon: Jerene Bears, MD;  Location: [place];  Service: Gastroenterology;  Laterality: N/A;  . ESOPHAGOGASTRODUODENOSCOPY (EGD) WITH PROPOFOL N/A 07/19/2017   Procedure: ESOPHAGOGASTRODUODENOSCOPY (EGD) WITH PROPOFOL;  Surgeon: Jerene Bears, MD;  Location: [facility] ENDOSCOPY;  Service: Gastroenterology;  Laterality: N/A;  . LUMBAR LAMINECTOMY  06/11/2001  . LUMBAR LAMINECTOMY  02/09/2006  . LUMBAR LAMINECTOMY  07/07/2006   with spianal cord stimulator  . SPINAL CORD STIMULATOR REMOVAL  14/2009  . TUBAL LIGATION     Family History  Problem Relation Age of Onset  . Diabetes Mother   . Heart failure Father   . Breast cancer Sister   . Heart failure Sister   . Diabetes Son   . Diabetes Sister   . Colon cancer Neg Hx   . Stomach cancer Neg Hx   . Rectal cancer Neg Hx   . Esophageal cancer Neg Hx    Social History   Tobacco Use  . Smoking status: Never Smoker  . Smokeless tobacco: Never Used  Substance Use Topics  . Alcohol use: No  . Drug use: No   Current Outpatient Medications  Medication Sig Dispense Refill  . atorvastatin (LIPITOR) 40 MG tablet Take 1 tablet (40 mg total) by mouth daily. 90 tablet 3  . Cholecalciferol (  VITAMIN D-3) 1000 units CAPS Take 1,000 Units by mouth daily.    . metoprolol tartrate (LOPRESSOR) 25 MG tablet Take 1 tablet (25 mg total) by mouth 2 (two) times daily. 30 tablet 0  . Multiple Vitamins-Minerals (ICAPS AREDS 2 PO) Take 1 capsule by mouth 2 (two) times daily.     Marland Kitchen omeprazole (PRILOSEC) 40 MG capsule Take 1 capsule (40 mg total) by mouth daily. 90 capsule 1  . polyethylene glycol (MIRALAX / GLYCOLAX) packet Take 17 g by mouth daily. 14 each 0  . Propylene Glycol (SYSTANE COMPLETE) 0.6 % SOLN Place 1 drop into both eyes daily.    . Rivaroxaban (XARELTO) 15 MG  TABS tablet Take 1 tablet (15 mg total) by mouth daily with supper. 90 tablet 1  . tolterodine (DETROL LA) 2 MG 24 hr capsule Take 1 capsule (2 mg total) by mouth daily. 90 capsule 0  . verapamil (CALAN-SR) 240 MG CR tablet Take 1 tablet (240 mg total) by mouth at bedtime. 30 tablet 1   No current facility-administered medications for this visit.    Allergies  Allergen Reactions  . Sotalol Other (See Comments)    Seizures   . Erythromycin Nausea Only  . Augmentin [Amoxicillin-Pot Clavulanate] Rash  . Crestor [Rosuvastatin Calcium] Rash  . Keflex [Cephalexin] Rash  . Septra [Sulfamethoxazole-Trimethoprim] Rash     Review of Systems: All systems reviewed and negative except where noted in HPI.   CBC Latest Ref Rng & Units 08/17/2017 07/27/2017 07/20/2017  WBC 4.0 - 10.5 K/uL 8.9 - 7.4  Hemoglobin 12.0 - 15.0 g/dL 10.0(L) - 9.2(L)  Hematocrit 36.0 - 46.0 % 30.8(L) 32.3(L) 29.2(L)  Platelets 150.0 - 400.0 K/uL 171.0 - 239     Physical Exam: BP 134/64 (BP Location: Left Arm, Patient Position: Sitting, Cuff Size: Normal)   Pulse 80   Ht 5' (1.524 m)   Wt 131 lb (59.4 kg)   BMI 25.58 kg/m  Constitutional: Pleasant,, female in no acute distress using walker HEENT: Normocephalic and atraumatic. Conjunctivae are normal. No scleral icterus. Neck supple.  Cardiovascular: Normal rate, regular rhythm.  Pulmonary/chest: Effort normal and breath sounds normal. No wheezing, rales or rhonchi. Abdominal: Soft, nondistended, nontender.  There are no masses palpable.  Extremities: no edema Lymphadenopathy: No cervical adenopathy noted. Neurological: Alert and oriented to person place and time. Skin: Skin is warm and dry. No rashes noted. Psychiatric: Normal mood and affect. Behavior is normal.   ASSESSMENT AND PLAN: 84 year old female with history as outlined above, on relative for atrial fibrillation, presenting for follow-up after hospitalization for worsening iron deficiency  anemia.  Her workup as above status post EGD and colonoscopy is remarkable for a large hiatal hernia with Lysbeth Galas lesions. I suspect this is the likely cause for her iron deficiency. She was given a dose of IV iron and her hemoglobin is rising, rechecked today and is 10. She stopped oral iron, I suspect are normal iron panel a few weeks ago is due to her IV iron administration recent blood cell transfusion. I recommend she resume oral iron and continue this to prevent worsening of her anemia in the setting of anticoagulation. I also strongly she works a capsule endoscopy at this time and unless anemia fails to improve on this regimen as I suspect the hiatal hernia with Lysbeth Galas lesions is the likely cause. She should continue to avoid all NSAIDs. She can take MiraLAX as needed, I suspect she may need this when she start oral iron supplementation  she'll be at risk for worsening constipation.  She agreed with the plan, we will need to keep a close eye on her hemoglobin moving forward, we'll check it in 3 weeks or so to ensure stable.  [place] Cellar, MD [facility]

## 2017-08-17 NOTE — Patient Instructions (Addendum)
If you are age 82 or older, your body mass index should be between 23-30. Your Body mass index is 25.58 kg/m. If this is out of the aforementioned range listed, please consider follow up with your Primary Care Provider.  If you are age 77 or younger, your body mass index should be between 19-25. Your Body mass index is 25.58 kg/m. If this is out of the aformentioned range listed, please consider follow up with your Primary Care Provider.   Please go to the lab in the basement of our building to have lab work done as you leave today.  Continue taking your Omeprazole.  Please take ferrous sulfate (iron) every day.  You can use Miralax daily, as needed.  Thank you for entrusting me with your care and for choosing [facility], Dr. [place] Cellar

## 2017-08-18 ENCOUNTER — Other Ambulatory Visit: Payer: Self-pay

## 2017-08-18 DIAGNOSIS — D509 Iron deficiency anemia, unspecified: Secondary | ICD-10-CM

## 2017-08-18 NOTE — Progress Notes (Signed)
Repeat cbc in 3 weeks.  See result note

## 2017-08-24 ENCOUNTER — Telehealth: Payer: Self-pay

## 2017-08-24 NOTE — Telephone Encounter (Signed)
Faxed orders received from [facility] for [place] and Plan of Care. Orders placed in PCP's box for signature. Please return to nurse clinic when complete. Danley Danker, RN [facility] RN)

## 2017-08-25 ENCOUNTER — Ambulatory Visit: Payer: Medicare Other | Admitting: Family Medicine

## 2017-08-28 NOTE — Telephone Encounter (Signed)
Returned to RN box.  

## 2017-08-29 NOTE — Telephone Encounter (Signed)
Signed orders faxed to [facility] at [phone]. Danley Danker, RN [facility] RN)

## 2017-08-31 ENCOUNTER — Telehealth: Payer: Self-pay | Admitting: *Deleted

## 2017-08-31 LAB — CUP PACEART REMOTE DEVICE CHECK
Battery Remaining Longevity: 42 mo
Battery Remaining Percentage: 53 %
Brady Statistic RA Percent Paced: 55 %
Brady Statistic RV Percent Paced: 0 %
Date Time Interrogation Session: 20190509193800
HighPow Impedance: 48 Ohm
Implantable Lead Implant Date: 20111110
Implantable Lead Implant Date: 20111110
Implantable Lead Location: 753859
Implantable Lead Location: 753860
Implantable Lead Model: 157
Implantable Lead Model: 4135
Implantable Lead Serial Number: 28803817
Implantable Lead Serial Number: 302861
Implantable Pulse Generator Implant Date: 20111110
Lead Channel Impedance Value: 432 Ohm
Lead Channel Impedance Value: 663 Ohm
Lead Channel Pacing Threshold Amplitude: 0.5 V
Lead Channel Pacing Threshold Amplitude: 1 V
Lead Channel Pacing Threshold Pulse Width: 0.4 ms
Lead Channel Pacing Threshold Pulse Width: 0.4 ms
Lead Channel Setting Pacing Amplitude: 2 V
Lead Channel Setting Pacing Amplitude: 2.4 V
Lead Channel Setting Pacing Pulse Width: 0.4 ms
Lead Channel Setting Sensing Sensitivity: 0.6 mV
Pulse Gen Serial Number: 169112

## 2017-08-31 NOTE — Telephone Encounter (Signed)
-----   Message from Will Meredith Leeds, MD sent at 08/31/2017  2:07 PM EDT ----- Abnormal device interrogation reviewed.  Lead parameters and battery status stable.  Multiple atrial high rate episodes. Increase metoprolol to 50 mg BID

## 2017-08-31 NOTE — Telephone Encounter (Signed)
Attempted call x 2 (HOME) - N/A, unable to LVM. Attempted call x 1 (Mobile) - line busy.

## 2017-09-01 ENCOUNTER — Ambulatory Visit (INDEPENDENT_AMBULATORY_CARE_PROVIDER_SITE_OTHER): Payer: Medicare Other | Admitting: Family Medicine

## 2017-09-01 ENCOUNTER — Telehealth: Payer: Self-pay | Admitting: *Deleted

## 2017-09-01 ENCOUNTER — Encounter: Payer: Self-pay | Admitting: Family Medicine

## 2017-09-01 ENCOUNTER — Other Ambulatory Visit: Payer: Self-pay | Admitting: Family Medicine

## 2017-09-01 ENCOUNTER — Other Ambulatory Visit: Payer: Self-pay

## 2017-09-01 VITALS — BP 170/86 | HR 110 | Temp 98.1°F | Ht 60.0 in | Wt 131.0 lb

## 2017-09-01 DIAGNOSIS — I480 Paroxysmal atrial fibrillation: Secondary | ICD-10-CM

## 2017-09-01 DIAGNOSIS — T888XXA Other specified complications of surgical and medical care, not elsewhere classified, initial encounter: Secondary | ICD-10-CM

## 2017-09-01 MED ORDER — METOPROLOL TARTRATE 25 MG PO TABS: 25.0000 mg | ORAL_TABLET | Freq: Two times a day (BID) | ORAL | 0 refills | Status: DC

## 2017-09-01 MED ORDER — VERAPAMIL HCL ER 240 MG PO TBCR: 240.0000 mg | EXTENDED_RELEASE_TABLET | Freq: Every day | ORAL | 0 refills | Status: DC

## 2017-09-01 MED ORDER — METOPROLOL TARTRATE 50 MG PO TABS: 50.0000 mg | ORAL_TABLET | Freq: Two times a day (BID) | ORAL | 3 refills | Status: DC

## 2017-09-01 NOTE — Telephone Encounter (Signed)
Tried to contact pt to inform her of the date and time of her CT and phone only rang and then said call could not be completed at this time.  If pt calls back please inform her that her appointment is scheduled for 09/06/17 @ [place]. Location is at the [place]. She is to arrive at 8:40am for a 9:00am appointment.  Also pt can only have liquids 4 hours prior.  Please inform pt of this if she calls back.  Katharina Caper, Xia Stohr D, [place]

## 2017-09-01 NOTE — Progress Notes (Signed)
Medication Samples have been provided to the patient.  Drug name: [name]: 14  LOT: [id number]  Exp.Date: 07/26/2019  The patient has been instructed regarding the correct time, dose, and frequency of taking this medication, including desired effects and most common side effects.   Brenda [name] 9:30AM 09/01/2017

## 2017-09-01 NOTE — Progress Notes (Signed)
CC: refills  HPI  Grief - husband passed away last week after somewhat abrupt onset of cardiomyopathy after CABG and was at [facility] when he passed. She is tearful today, daughter does live in town and is keeping up with her.  Unfortunately, he manages all of her medications including requesting refills when appropriate.  She is quite confused about her medication regimen today.    Refills - she has 1 week of xarelto left, states she has not gotten any new [facility]ill [facility]. Has NOT missed any doses.   Has been out of verapamil for a couple of weeks. Has felt some palpitations.   "Lump" on the back: She thinks is been present about a week, no fever, painful to lie on.  Daughter noticed it when she was helping her change closed.  No new weakness.  ROS: Denies CP, SOB, abdominal pain, dysuria, changes in BMs.   CC, SH/smoking status, and VS noted  Objective: BP (!) 170/86   Pulse (!) 110   Temp 98.1 F (36.7 C) (Oral)   Ht 5' (1.524 m)   Wt 131 lb (59.4 kg)   SpO2 98%   BMI 25.58 kg/m  *vitals measured after patient was tearful relating that husband had passed, manual HR recheck 89 on my exam Gen: NAD, alert, cooperative, and pleasant. HEENT: NCAT, EOMI, PERRL CV: RRR, no murmur Resp: CTAB, no wheezes, non-labored Abd: SNTND, BS present, no guarding or organomegaly.  3 cm x 3 cm fluid filled mass over thoracic spine, just proximal to surgical scar.  No overlying erythema or warmth.  Nontender to palpation. Ext: No edema, warm Neuro: Alert and oriented, Speech clear, No gross deficits  Assessment and plan:  Paroxysmal A-fib ([place]) Noted phone encounter from cards with transmission showing increased atrial rate, and suggesting increasing metoprolol dose.  Messaged Dr. Curt Bears and his nurse, explained that patient has been out of verapamil, he suggested that we refill verapamil and continue current course without increasing metoprolol.  Patient with 1 week  left of Xarelto, and person samples given today in order to avoid missing doses.  Coordination of care: Reach out to Dr. Everitt Amber to help with medication management in this patient with mild cognitive impairment and now decreased family help with medication management.  Scheduled her for his first available appointment and will request coordinated refills from [facility] in order to avoid confusion.  Attempted [facility] referral, however it appears patient is not a candidate for [facility].  Thoracic spine mass: Feels fluid-filled, no overlying suggestion of infection.  Question post surgical changes vs pseudomeningocele despite many years since surgery. Discussed with radiology given pacemaker and CKD, will attempt low-dose contrast for CT spine.  Grief - offered [facility], patient declines.   Orders Placed This Encounter  Procedures  . CT Lumbar Spine W Contrast    Epic order Draw labs @ GI Evaluate midline superficial fluid collection, on xarelto, no overlying skin findings, present about 1 week. Hx of back surgery. Please use reduced dose contrast. Wt 131 / diab / nkda to iodine/ct cm / no kidney issues / walker - minimal assistance Ins - mcr/tricare Bl/april @ [facility]    Standing Status:   Future    Standing Expiration Date:   12/03/2018    Order Specific Question:   If indicated for the ordered procedure, I authorize the administration of contrast media per Radiology protocol    Answer:   Yes    Order Specific Question:  Preferred imaging location?    Answer:   [facility]    Order Specific Question:   Radiology Contrast Protocol - do NOT remove file path    Answer:   [email or web address]    Meds ordered this encounter  Medications  . verapamil (CALAN-SR) 240 MG CR tablet    Sig: Take 1 tablet (240 mg total) by mouth at bedtime.    Dispense:  30 tablet    Refill:  0    Stopped Diltiazem  . DISCONTD: metoprolol tartrate (LOPRESSOR) 25 MG tablet    Sig: Take 1  tablet (25 mg total) by mouth 2 (two) times daily.    Dispense:  60 tablet    Refill:  0    Decreasing dosage    Ralene Ok, MD, PGY2 09/05/2017 8:19 AM

## 2017-09-01 NOTE — Telephone Encounter (Signed)
Contacted pt and informed her of the time and date of her CT and she stated that she had another appointment on that day.  I provided her with the number to [facility] so she can reschedule.  Katharina Caper, [place] Better D, [place]

## 2017-09-01 NOTE — Telephone Encounter (Signed)
Spoke with pt regarding increase of Lopressor to 50mg  ( 2 pills) twice a day pt voiced understanding, pt stated to send prescription into [facility]. Informed pt that she is due to see Dr. Ileana Ladd in August and she will get a letter in the mail to schedule that apt.

## 2017-09-01 NOTE — Patient Instructions (Signed)
It was a pleasure to see you today! Thank you for choosing [facility] for your primary care. Brenda [name] was seen for medication refills.   Our plans for today were:  Go to [facility] and get the two heart medications today. We will give you a sample of the Xarelto for the next couple of weeks.   Come back on 6/20 and [facility] EVERY MEDICINE FROM YOUR HOUSE.   Best,  Dr. Lindell Noe

## 2017-09-05 NOTE — Assessment & Plan Note (Addendum)
Noted phone encounter from cards with transmission showing increased atrial rate, and suggesting increasing metoprolol dose.  Messaged Dr. Curt Bears and his nurse, explained that patient has been out of verapamil, he suggested that we refill verapamil and continue current course without increasing metoprolol.  Patient with 1 week left of Xarelto, and person samples given today in order to avoid missing doses.

## 2017-09-06 ENCOUNTER — Other Ambulatory Visit (INDEPENDENT_AMBULATORY_CARE_PROVIDER_SITE_OTHER): Payer: Medicare Other

## 2017-09-06 ENCOUNTER — Other Ambulatory Visit: Payer: Medicare Other

## 2017-09-06 DIAGNOSIS — D509 Iron deficiency anemia, unspecified: Secondary | ICD-10-CM

## 2017-09-06 DIAGNOSIS — C44311 Basal cell carcinoma of skin of nose: Secondary | ICD-10-CM | POA: Diagnosis not present

## 2017-09-06 LAB — CBC WITH DIFFERENTIAL/PLATELET
Basophils Absolute: 0.1 10*3/uL (ref 0.0–0.1)
Basophils Relative: 0.7 % (ref 0.0–3.0)
Eosinophils Absolute: 0.1 10*3/uL (ref 0.0–0.7)
Eosinophils Relative: 0.9 % (ref 0.0–5.0)
HCT: 32.4 % — ABNORMAL LOW (ref 36.0–46.0)
Hemoglobin: 10.8 g/dL — ABNORMAL LOW (ref 12.0–15.0)
Lymphocytes Relative: 23.5 % (ref 12.0–46.0)
Lymphs Abs: 1.8 10*3/uL (ref 0.7–4.0)
MCHC: 33.4 g/dL (ref 30.0–36.0)
MCV: 89 fl (ref 78.0–100.0)
Monocytes Absolute: 0.5 10*3/uL (ref 0.1–1.0)
Monocytes Relative: 7 % (ref 3.0–12.0)
Neutro Abs: 5.1 10*3/uL (ref 1.4–7.7)
Neutrophils Relative %: 67.9 % (ref 43.0–77.0)
Platelets: 205 10*3/uL (ref 150.0–400.0)
RBC: 3.64 Mil/uL — ABNORMAL LOW (ref 3.87–5.11)
RDW: 25.4 % — ABNORMAL HIGH (ref 11.5–15.5)
WBC: 7.5 10*3/uL (ref 4.0–10.5)

## 2017-09-07 ENCOUNTER — Telehealth: Payer: Self-pay | Admitting: *Deleted

## 2017-09-07 NOTE — Telephone Encounter (Signed)
-----   Message from Will Meredith Leeds, MD sent at 09/01/2017  2:11 PM EDT ----- OK to hold off on increased dose if she was not taking her verapamil. Thanks for the update. I took care of her husband as well.   Will Camnitz ----- Message ----- From: Sela Hilding, MD Sent: 09/01/2017   9:36 AM To: Shiela Mayer, RN, #  Hi Team,  I am Mrs. Demetrius Charity PCP and she was seen today. She told me that her husband, Lynann Bologna, passed away last week (CAD with end stage cardiomyopathy). She is of course appropriately sad. Unfortunately, he managed all of her medications for her. While he was hospitalized and in hospice last month, she has run out of a few meds. I tried to help with this today, and I am having her bring all meds to see pharmacist in 1 week.  I saw Dr. Macky Lower note about elevated atrial rate, and she had been out of her verapamil for several weeks, likely during this time.  She did tell me that she received a call from your office this morning and asked them to call back as she was arriving here.  You may want to check with Dr. Curt Bears to see if he does still want to increase her metoprolol despite being out of her verapamil.  I gave her a one-month supply of both her verapamil and her metoprolol to a local pharmacy, as her insurance requires mail order prescription so that can frequently take a long time.  Additionally, I gave her a sample in person of her Xarelto as she only had 1 week left of this.  She is coming to see my pharmacist next week and we will try to refill everything at once so that she can use a pillbox and hopefully avoid medication confusion.  Wanted to update Dr. Curt Bears on her status given the recent pacemaker transmission. Thanks,  Anda Kraft

## 2017-09-07 NOTE — Telephone Encounter (Signed)
Spoke to patient regarding Dr.Camnitz's recommendation. Informed her that she was to continue taking the Lopressor 25mg  BID. Patient verbalized understanding.

## 2017-09-11 ENCOUNTER — Other Ambulatory Visit: Payer: Self-pay

## 2017-09-11 DIAGNOSIS — D649 Anemia, unspecified: Secondary | ICD-10-CM

## 2017-09-12 ENCOUNTER — Other Ambulatory Visit: Payer: Self-pay

## 2017-09-13 ENCOUNTER — Ambulatory Visit
Admission: RE | Admit: 2017-09-13 | Discharge: 2017-09-13 | Disposition: A | Discharge status: Home / Self Care | Payer: Medicare Other | Source: Ambulatory Visit | Attending: Family Medicine | Admitting: Family Medicine

## 2017-09-13 DIAGNOSIS — T888XXA Other specified complications of surgical and medical care, not elsewhere classified, initial encounter: Secondary | ICD-10-CM

## 2017-09-13 DIAGNOSIS — M545 Low back pain: Secondary | ICD-10-CM | POA: Diagnosis not present

## 2017-09-13 MED ORDER — IOPAMIDOL (ISOVUE-300) INJECTION 61%
100.0000 mL | Freq: Once | INTRAVENOUS | Status: AC | PRN
  Administered 2017-09-13: 80 mL via INTRAVENOUS

## 2017-09-14 ENCOUNTER — Ambulatory Visit (INDEPENDENT_AMBULATORY_CARE_PROVIDER_SITE_OTHER): Payer: Medicare Other | Admitting: Pharmacist

## 2017-09-14 ENCOUNTER — Encounter: Payer: Self-pay | Admitting: Pharmacist

## 2017-09-14 DIAGNOSIS — Z8673 Personal history of transient ischemic attack (TIA), and cerebral infarction without residual deficits: Secondary | ICD-10-CM

## 2017-09-14 MED ORDER — METOPROLOL TARTRATE 25 MG PO TABS: 25.0000 mg | ORAL_TABLET | Freq: Two times a day (BID) | ORAL | 3 refills | Status: DC

## 2017-09-14 MED ORDER — TOLTERODINE TARTRATE ER 2 MG PO CP24
2.0000 mg | ORAL_CAPSULE | Freq: Every day | ORAL | 1 refills | Status: DC
Start: 2017-09-14 — End: 2018-01-19

## 2017-09-14 NOTE — Progress Notes (Signed)
   S:    Patient arrives today in good spirits with daughter, Freada Bergeron. Presents to the clinic for medication management, organization and disposal of husband's old medications. Patient was referred by Dr. Lindell Noe on 09/01/2017.  Patient was last seen by Primary Care Provider on 09/01/2017. Last Rx Clinic visit was on 04/20/2017 - at that time patient was counseled on medication management, adherence, and storage of medications.  Patient reports adherence with medications. She presents with all medications and empty pill box. Patient endorses confusion about Xarelto dosing and she reports she has been taking Xarelto 15 mg BID for the last week. Denies falls in the last month. States that she has been out of tolterodine and has been taking oxybutynin. Reports tolterodine worked better but she ran out of refills.   O:  Physical Exam  Constitutional: She appears well-developed and well-nourished.  Cardiovascular: Normal rate.    Review of Systems  Gastrointestinal: Negative for blood in stool and constipation.  All other systems reviewed and are negative.   Last 3 Office BP readings: BP Readings from Last 3 Encounters:  09/14/17 134/60  09/01/17 (!) 170/86  08/17/17 134/64    A/P: Medication management and organization - Patient not using pill box and had some confusion over Xarelto dosing. Good support via daughter, Freada Bergeron, who agrees to help fill pill box in the future.  - Updated current medication list with appropriate doses, frequencies, and time of day.  - Clarified Xarelto dosing with patient, 15mg  once daily.  - Filled patient's weekly pill box with appropriate medications - D/c'ed oxybutynin 5mg  and continued tolterodine 2mg  daily - Disposed of deceased husband's medications.  Results reviewed and written information provided.   Total time in face-to-face counseling 30 minutes.   F/U Clinic Visit in a month.  Patient seen with Nida Boatman, PharmD, PGY1 Pharmacy Resident and  Deirdre Pippins, PharmD, BCPS, PGY2 Pharmacy Resident.

## 2017-09-14 NOTE — Patient Instructions (Signed)
Great to see you today.   STOP taking the oxybutynin.

## 2017-09-14 NOTE — Assessment & Plan Note (Signed)
Medication management and organization - Patient not using pill box and had some confusion over Xarelto dosing. Good support via daughter, Freada Bergeron, who agrees to help fill pill box in the future.  - Updated current medication list with appropriate doses, frequencies, and time of day.  - Clarified Xarelto dosing with patient, 15mg  once daily.  - Filled patient's weekly pill box with appropriate medications - D/c'ed oxybutynin 5mg  and continued tolterodine 2mg  daily

## 2017-09-14 NOTE — Progress Notes (Signed)
Patient ID: Brenda [name], female   DOB: 04/23/1931, 84 y.o.   MRN: [id number] Reviewed: Agree with Dr. Graylin Shiver documentation and management.

## 2017-09-19 ENCOUNTER — Telehealth (HOSPITAL_COMMUNITY): Payer: Self-pay | Admitting: Family Medicine

## 2017-09-19 DIAGNOSIS — T888XXA Other specified complications of surgical and medical care, not elsewhere classified, initial encounter: Secondary | ICD-10-CM

## 2017-09-19 DIAGNOSIS — M545 Other chronic pain: Secondary | ICD-10-CM

## 2017-09-19 DIAGNOSIS — G8929 Other chronic pain: Secondary | ICD-10-CM

## 2017-09-19 NOTE — Telephone Encounter (Signed)
Patient's imaging reviewed revealing subq fluid collection. Given anticoagulation, will not drain in the office. Will refer to back surgeon to evaluation and consider drainage. Discussed this with daughter, Donavan Burnet. Explained that reasons to be seen prior to neuro appt include worsening pain, drainage from site, new weakness, redness or warmth over the site.

## 2017-09-29 ENCOUNTER — Other Ambulatory Visit: Payer: Self-pay

## 2017-09-29 ENCOUNTER — Encounter: Payer: Self-pay | Admitting: Family Medicine

## 2017-09-29 ENCOUNTER — Ambulatory Visit (INDEPENDENT_AMBULATORY_CARE_PROVIDER_SITE_OTHER): Payer: Medicare Other | Admitting: Family Medicine

## 2017-09-29 VITALS — BP 138/78 | HR 78 | Temp 97.6°F | Ht 59.0 in | Wt 130.6 lb

## 2017-09-29 DIAGNOSIS — N390 Urinary tract infection, site not specified: Secondary | ICD-10-CM | POA: Diagnosis present

## 2017-09-29 MED ORDER — NITROFURANTOIN MONOHYD MACRO 100 MG PO CAPS: 100.0000 mg | ORAL_CAPSULE | Freq: Two times a day (BID) | ORAL | 0 refills | Status: AC

## 2017-09-29 NOTE — Progress Notes (Addendum)
   Subjective:   Patient ID: Brenda [name]    DOB: 1931-05-24, 84 y.o. female   MRN: [id number]  CC:  Dysuria and itching   HPI: Brenda [name] is a 84 y.o. female who presents to clinic today for burning itching.  Urinary burning/itching  Itching and burning with urination x 2 months.  Has not taken anything for it.  She has not had any urinary tract infections in the past.  No fevers, chills, nausea or vomiting.  No abdominal pain but sometimes pain in her suprapubic pain.  Has history of back pain, no flank pain noted.  Urine does not appear dark, it is clear per her report.  She denies frequency and urgency. No vaginal discharge.  She notes some foul smell about a month ago which has now resolved.   She has some trouble urinating and is unfortunately unable to give a sample for UA today.   She does not feel she has been drinking enough water.   Of note, she is allergic to keflex, sulfa drugs, erythromycin and augmentin.    ROS: No fever, chills, nausea, vomiting.  No flank pain.  +dysuria.   Social: pt is a never smoker   Medications reviewed. Objective:   BP 138/78   Pulse 78   Temp 97.6 F (36.4 C) (Oral)   Ht 4\' 11"  (1.499 m)   Wt 130 lb 9.6 oz (59.2 kg)   SpO2 98%   BMI 26.38 kg/m  Vitals and nursing note reviewed.  General: Elderly 84 year old female, NAD HEENT: NCAT, EOMI, PERRLA, MMM, oropharynx clear Neck: supple, nontender, normal range of motion CV: RRR no MRG, 2+ pedal pulses Lungs: CTA B, normal effort Abdomen: soft, nondistended, mild tenderness to palpation of her lower abdomen, no flank pain, +bs  Skin: warm, dry, no rash or lesions Extremities: warm and well perfused, normal tone  Assessment & Plan:   Urinary tract infection Patient unable to provide a urine sample today.  She does not exhibit any suprapubic tenderness or flank pain, however with mild tenderness in lower abdomen and symptoms of dysuria will treat as presumed urinary tract infection.   She is allergic to a number of antibiotics.  -Rx: Nitrofurantoin 100 mg twice daily for 5 days -Advised to stay well-hydrated -Return precautions discussed  Meds ordered this encounter  Medications  . nitrofurantoin, macrocrystal-monohydrate, (MACROBID) 100 MG capsule    Sig: Take 1 capsule (100 mg total) by mouth 2 (two) times daily for 5 days.    Dispense:  10 capsule    Refill:  0   Follow-up: If symptoms worsen or do not improve  Lovenia Kim, MD [place], PGY-3 10/05/2017 6:02 PM

## 2017-09-29 NOTE — Patient Instructions (Signed)
It was good to see you again today.  You were seen in clinic for urinary burning and itching which is most likely due to a urinary tract infection.  As we discussed, it is important that you drink plenty of water.  I am prescribing you a course of antibiotics and I have sent this to your pharmacy.  Please take this twice a day for the next 5 days.  I suspect your infection should clear over the next several days, however if you experience any fevers, chills, nausea, vomiting or worsening symptoms, please make an appointment to be seen by a provider.  Please call clinic if you have any questions.  Be well, Lovenia Kim MD

## 2017-10-11 ENCOUNTER — Other Ambulatory Visit: Payer: Self-pay

## 2017-10-11 NOTE — Telephone Encounter (Signed)
Pt called nurse line requesting a refill on pending medications, please advise.

## 2017-10-12 ENCOUNTER — Ambulatory Visit: Payer: Medicare Other | Admitting: Student in an Organized Health Care Education/Training Program

## 2017-10-12 MED ORDER — METOPROLOL TARTRATE 25 MG PO TABS: 25.0000 mg | ORAL_TABLET | Freq: Two times a day (BID) | ORAL | 3 refills | Status: DC

## 2017-10-12 MED ORDER — VERAPAMIL HCL ER 240 MG PO TBCR: 240.0000 mg | EXTENDED_RELEASE_TABLET | Freq: Every day | ORAL | 0 refills | Status: DC

## 2017-10-12 NOTE — Progress Notes (Deleted)
Subjective:    Brenda [name] - 84 y.o. female MRN [id number]  Date of birth: 1931-09-06  HPI  Brenda [name] is here for ***.   Health Maintenance:  - *** Health Maintenance Due  Topic Date Due  . OPHTHALMOLOGY EXAM  10/26/1943  . URINE MICROALBUMIN  10/26/1943  . TETANUS/TDAP  10/25/1952  . PNA vac Low Risk Adult (1 of 2 - PCV13) 10/26/1998  . HEMOGLOBIN A1C  11/02/2016    -  reports that she has never smoked. She has never used smokeless tobacco. - Review of Systems: Per HPI. - Past Medical History: Patient Active Problem List   Diagnosis Date Noted  . Acute pain of left shoulder 07/27/2017  . GERD (gastroesophageal reflux disease) 07/18/2017  . Gastrointestinal hemorrhage   . Symptomatic anemia 07/17/2017  . Iron deficiency anemia 05/09/2017  . Osteoporosis 11/24/2016  . Skin cancer of nose 11/23/2016  . 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 UTI (urinary tract infection) 10/26/2016  . Paroxysmal A-fib ([place]) 08/05/2016  . AKI (acute kidney injury) ([place]) 05/25/2016  . Essential hypertension 05/05/2016  . Type 2 diabetes mellitus without complication, without long-term current use of insulin ([place]) 05/05/2016  . Hiatal hernia 05/05/2016  . History of CVA (cerebrovascular accident) without residual deficits 05/05/2016  . Memory change 05/05/2016  . AICD (automatic cardioverter/defibrillator) present 05/05/2016  . Back pain 05/05/2016   - Medications: reviewed and updated Current Outpatient Medications  Medication Sig Dispense Refill  . atorvastatin (LIPITOR) 40 MG tablet TAKE 1 TABLET BY MOUTH DAILY 30 tablet 0  . Cholecalciferol (VITAMIN D-3) 1000 units CAPS Take 1,000 Units by mouth daily.    . ferrous sulfate 325 (65 FE) MG tablet Take 325 mg by mouth daily with breakfast.    . metoprolol tartrate (LOPRESSOR) 25 MG tablet Take 1 tablet (25 mg total) by mouth 2 (two) times daily. 180 tablet 3  . Multiple Vitamins-Minerals (ICAPS AREDS 2 PO) Take 1 capsule by  mouth 2 (two) times daily.     Marland Kitchen omeprazole (PRILOSEC) 40 MG capsule Take 1 capsule (40 mg total) by mouth daily. 90 capsule 1  . polyethylene glycol (MIRALAX / GLYCOLAX) packet Take 17 g by mouth daily. 14 each 0  . Propylene Glycol (SYSTANE COMPLETE) 0.6 % SOLN Place 1 drop into both eyes daily.    . Rivaroxaban (XARELTO) 15 MG TABS tablet Take 1 tablet (15 mg total) by mouth daily with supper. 90 tablet 1  . tolterodine (DETROL LA) 2 MG 24 hr capsule Take 1 capsule (2 mg total) by mouth daily. 90 capsule 1  . verapamil (CALAN-SR) 240 MG CR tablet Take 1 tablet (240 mg total) by mouth at bedtime. 30 tablet 0   No current facility-administered medications for this visit.     Review of Systems See HPI     Objective:   Physical Exam There were no vitals taken for this visit. Gen: NAD, alert, cooperative with exam, well-appearing *** HEENT: NCAT, PERRL, clear conjunctiva, oropharynx clear, supple neck CV: RRR, good S1/S2, no murmur, no edema, capillary refill brisk  Resp: CTABL, no wheezes, non-labored Abd: SNTND, BS present, no guarding or organomegaly Skin: no rashes, normal turgor  Neuro: no gross deficits.  Psych: good insight, alert and oriented     Assessment & Plan:   No problem-specific Assessment & Plan notes found for this encounter.   No orders of the defined types were placed in this encounter.   No orders of the defined types were placed  in this encounter.   Everrett Coombe, MD,MS,  PGY2 10/12/2017 10:39 AM

## 2017-10-17 ENCOUNTER — Other Ambulatory Visit: Payer: Self-pay

## 2017-10-17 ENCOUNTER — Ambulatory Visit (INDEPENDENT_AMBULATORY_CARE_PROVIDER_SITE_OTHER): Payer: Medicare Other | Admitting: Family Medicine

## 2017-10-17 VITALS — BP 132/68 | HR 54 | Temp 97.4°F | Wt 132.8 lb

## 2017-10-17 DIAGNOSIS — R399 Unspecified symptoms and signs involving the genitourinary system: Secondary | ICD-10-CM | POA: Diagnosis not present

## 2017-10-17 DIAGNOSIS — N898 Other specified noninflammatory disorders of vagina: Secondary | ICD-10-CM | POA: Diagnosis not present

## 2017-10-17 LAB — POCT URINALYSIS DIP (MANUAL ENTRY)
Bilirubin, UA: NEGATIVE
Blood, UA: NEGATIVE
Glucose, UA: NEGATIVE mg/dL
Ketones, POC UA: NEGATIVE mg/dL
Nitrite, UA: NEGATIVE
Protein Ur, POC: NEGATIVE mg/dL
Spec Grav, UA: 1.01 (ref 1.010–1.025)
Urobilinogen, UA: 0.2 E.U./dL
pH, UA: 5.5 (ref 5.0–8.0)

## 2017-10-17 MED ORDER — METOPROLOL TARTRATE 25 MG PO TABS: 25.0000 mg | ORAL_TABLET | Freq: Two times a day (BID) | ORAL | 3 refills | Status: DC

## 2017-10-17 MED ORDER — FLUCONAZOLE 150 MG PO TABS: 150.0000 mg | ORAL_TABLET | Freq: Once | ORAL | 0 refills | Status: AC

## 2017-10-17 NOTE — Patient Instructions (Signed)
You are seen in clinic today for burning with urination.  Your urine did not definitively show a urinary tract infection, however on physical exam it appears you have a yeast infection.  Is possible you developed this following your last course of antibiotics.  I  have prescribed 2 tablets of Diflucan to treat this.  Please take 1 tablet today followed by 1 on Thursday.  If you have any new or worsening symptoms, you will need to be seen by a provider.  Please call clinic if you have any questions.  Be well, Lovenia Kim MD

## 2017-10-17 NOTE — Progress Notes (Signed)
   Subjective:   Patient ID: Brenda [name]    DOB: [birth year], 84 y.o. female   MRN: [id number]  CC: vaginal irritation  HPI: Brenda [name] is a 84 y.o. female who presents to clinic today for vaginal burning and irritation.   Vaginal itching and irritation Patient endorses dysuria x1 months.  Associated with vaginal itching and irritation.  She took Macrobid recently for a UTI on 09/29/2017 and has completed this course of abx.  No new sexual partners.  No vaginal discharge or odor noted.   No urinary frequency or urgency. No fever, chills, abdominal or flank pain.  She notes some suprapubic tenderness which seems to be getting better.  No nausea, vomiting, diarrhea.  No blood in urine.    ROS: Fever, chills, nausea, vomiting.  No vaginal discharge or bleeding.  Social: pt is a never smoker.  Medications reviewed. Objective:   BP 132/68   Pulse (!) 54   Temp (!) 97.4 F (36.3 C) (Oral)   Wt 132 lb 12.8 oz (60.2 kg)   SpO2 94%   BMI 26.82 kg/m  Vitals and nursing note reviewed.  General: 84 year old elderly white female, NAD  HEENT: NCAT, EOMI, PERRL, MMM, oropharynx clear  Neck: supple, non-tender, normal ROM, no LAD  CV: RRR no MRG  Lungs: CTAB, normal effort  Abdomen: soft, NTND, no masses or organomegaly, +bs   Pelvic exam: VULVA: erythematous appearing vulva with no masses, tenderness or lesions, VAGINA: normal appearing vagina with normal color, erythema and small amount of thick white discharge  Skin: warm, dry, no rashes or lesions, cap refill < 2 seconds Extremities: warm and well perfused, normal tone Neuro: alert, oriented x3, no focal deficits   Assessment & Plan:   Vaginal irritation Speculum exam deferred - pelvic exam notable for vulvar erythema and small amount of thick white discharge c/w candidiasis.  Likely 2/2 recent use of antibiotics. UA with moderate leuks, no nitrite.  -Rx: diflucan -urine sent for cx  -return if symptoms worsen or do not  improve  Orders Placed This Encounter  Procedures  . Urine Culture  . POCT urinalysis dipstick   Meds ordered this encounter  Medications  . fluconazole (DIFLUCAN) 150 MG tablet    Sig: Take 1 tablet (150 mg total) by mouth once for 1 dose. Take one tablet today and one tablet in 2 days.    Dispense:  2 tablet    Refill:  0  . metoprolol tartrate (LOPRESSOR) 25 MG tablet    Sig: Take 1 tablet (25 mg total) by mouth 2 (two) times daily.    Dispense:  180 tablet    Refill:  3    Lovenia Kim, MD [place], PGY-3 10/23/2017 10:06 AM

## 2017-10-19 ENCOUNTER — Telehealth: Payer: Self-pay | Admitting: Family Medicine

## 2017-10-19 DIAGNOSIS — T888XXA Other specified complications of surgical and medical care, not elsewhere classified, initial encounter: Secondary | ICD-10-CM

## 2017-10-19 LAB — URINE CULTURE

## 2017-10-19 NOTE — Telephone Encounter (Signed)
Referral coordinator states referral did not come across correctly.  Will replace referral to neurosurgery for fluid collection around lumbar spine.

## 2017-10-23 DIAGNOSIS — N898 Other specified noninflammatory disorders of vagina: Secondary | ICD-10-CM | POA: Insufficient documentation

## 2017-10-23 NOTE — Assessment & Plan Note (Addendum)
Speculum exam deferred - pelvic exam notable for vulvar erythema and small amount of thick white discharge c/w candidiasis.  Likely 2/2 recent use of antibiotics. UA with moderate leuks, no nitrite.  -Rx: diflucan -urine sent for cx  -return if symptoms worsen or do not improve

## 2017-10-25 DIAGNOSIS — E119 Type 2 diabetes mellitus without complications: Secondary | ICD-10-CM | POA: Diagnosis not present

## 2017-11-02 ENCOUNTER — Ambulatory Visit (INDEPENDENT_AMBULATORY_CARE_PROVIDER_SITE_OTHER): Payer: Medicare Other | Admitting: *Deleted

## 2017-11-02 DIAGNOSIS — I472 Ventricular tachycardia, unspecified: Secondary | ICD-10-CM

## 2017-11-03 NOTE — Progress Notes (Signed)
Remote ICD transmission.   

## 2017-11-06 ENCOUNTER — Telehealth: Payer: Self-pay

## 2017-11-06 NOTE — Telephone Encounter (Signed)
-----   Message from Algernon Huxley, RN sent at 09/12/2017 11:31 AM EDT ----- Regarding: CBC Pt needs CBC done in 2 mths, order in epic

## 2017-11-06 NOTE — Telephone Encounter (Signed)
Spoke to patient, reminded her to come in for recheck CBC.

## 2017-11-09 DIAGNOSIS — I10 Essential (primary) hypertension: Secondary | ICD-10-CM | POA: Diagnosis not present

## 2017-11-09 DIAGNOSIS — T792XXA Traumatic secondary and recurrent hemorrhage and seroma, initial encounter: Secondary | ICD-10-CM | POA: Diagnosis not present

## 2017-11-13 ENCOUNTER — Telehealth: Payer: Self-pay | Admitting: Gastroenterology

## 2017-11-13 NOTE — Telephone Encounter (Signed)
Pt called confused wondering why her insurance did not cover her last labs, she received a bill from [facility] and she is under the impression that [facility] does not have her secondary insurance info but is not sure. Pt seemed very confused stating that it is very hard for her to figure it out by herself and is asking if we can help her since we are the ones that order labs. Pls call her.

## 2017-11-13 NOTE — Telephone Encounter (Signed)
Pt wants to know why Dr. Havery Moros ordered all the bloodwork that he ordered for pt. Pt called [facility] and was told that lab orders did not specify the reason why patient needed those bloodwork and that's why labs were not covered. Pls call pt.

## 2017-11-13 NOTE — Telephone Encounter (Signed)
Spoke to patient twice, she is getting billed from [facility] for CBC and states that Medicare did not pay for these tests, and wanted to know the reason. Let her know it was for anemia and follow up from hospital. I had her contact [facility] to ask them if they sent it to [facility]. Patient states that she told them to bill it to [facility]. They told her to appeal the decision.  Second phone call I have asked that patient put these bills to the side and give them to her daughter who helps her with these issues. She needs to contact Medicare and [facility] billing to see why they are denying these claims. If there is something we can do to help (office notes, diagnosis codes) I have asked to contact the office.

## 2017-11-13 NOTE — Telephone Encounter (Signed)
Spoke to patient, let her know that unfortunately I cannot help her with this issue. I did suggest that she look on the [facility] bill and find the phone number to contact their customer service representatives. She will call them, perhaps it's just that they did not file with her secondary insurance.

## 2017-11-14 DIAGNOSIS — T792XXA Traumatic secondary and recurrent hemorrhage and seroma, initial encounter: Secondary | ICD-10-CM | POA: Diagnosis not present

## 2017-11-14 DIAGNOSIS — I10 Essential (primary) hypertension: Secondary | ICD-10-CM | POA: Diagnosis not present

## 2017-11-16 ENCOUNTER — Other Ambulatory Visit: Payer: Self-pay | Admitting: Cardiology

## 2017-11-17 ENCOUNTER — Other Ambulatory Visit: Payer: Self-pay | Admitting: Family Medicine

## 2017-11-22 LAB — CUP PACEART REMOTE DEVICE CHECK
Date Time Interrogation Session: 20190828213843
Implantable Lead Implant Date: 20111110
Implantable Lead Implant Date: 20111110
Implantable Lead Location: 753859
Implantable Lead Location: 753860
Implantable Lead Model: 157
Implantable Lead Model: 4135
Implantable Lead Serial Number: 28803817
Implantable Lead Serial Number: 302861
Implantable Pulse Generator Implant Date: 20111110
Pulse Gen Serial Number: 169112

## 2017-12-11 ENCOUNTER — Emergency Department (HOSPITAL_COMMUNITY)
Admission: EM | Admit: 2017-12-11 | Discharge: 2017-12-11 | Disposition: A | Discharge status: Home / Self Care | Payer: Medicare Other | Attending: Emergency Medicine | Admitting: Emergency Medicine

## 2017-12-11 ENCOUNTER — Encounter (HOSPITAL_COMMUNITY): Payer: Self-pay | Admitting: *Deleted

## 2017-12-11 ENCOUNTER — Other Ambulatory Visit: Payer: Self-pay

## 2017-12-11 ENCOUNTER — Emergency Department (HOSPITAL_COMMUNITY): Payer: Medicare Other

## 2017-12-11 DIAGNOSIS — Z79899 Other long term (current) drug therapy: Secondary | ICD-10-CM | POA: Insufficient documentation

## 2017-12-11 DIAGNOSIS — M25532 Pain in left wrist: Secondary | ICD-10-CM | POA: Diagnosis not present

## 2017-12-11 DIAGNOSIS — I10 Essential (primary) hypertension: Secondary | ICD-10-CM | POA: Diagnosis not present

## 2017-12-11 DIAGNOSIS — M79642 Pain in left hand: Secondary | ICD-10-CM | POA: Diagnosis not present

## 2017-12-11 DIAGNOSIS — M7989 Other specified soft tissue disorders: Secondary | ICD-10-CM | POA: Diagnosis not present

## 2017-12-11 DIAGNOSIS — M130 Polyarthritis, unspecified: Secondary | ICD-10-CM | POA: Insufficient documentation

## 2017-12-11 DIAGNOSIS — M064 Inflammatory polyarthropathy: Secondary | ICD-10-CM

## 2017-12-11 DIAGNOSIS — M25512 Pain in left shoulder: Secondary | ICD-10-CM | POA: Diagnosis present

## 2017-12-11 DIAGNOSIS — E119 Type 2 diabetes mellitus without complications: Secondary | ICD-10-CM | POA: Insufficient documentation

## 2017-12-11 LAB — CBC WITH DIFFERENTIAL/PLATELET
Abs Immature Granulocytes: 0.1 10*3/uL (ref 0.0–0.1)
Basophils Absolute: 0 10*3/uL (ref 0.0–0.1)
Basophils Relative: 0 %
Eosinophils Absolute: 0 10*3/uL (ref 0.0–0.7)
Eosinophils Relative: 0 %
HCT: 34.8 % — ABNORMAL LOW (ref 36.0–46.0)
Hemoglobin: 11.3 g/dL — ABNORMAL LOW (ref 12.0–15.0)
Immature Granulocytes: 1 %
Lymphocytes Relative: 9 %
Lymphs Abs: 1.2 10*3/uL (ref 0.7–4.0)
MCH: 32.9 pg (ref 26.0–34.0)
MCHC: 32.5 g/dL (ref 30.0–36.0)
MCV: 101.5 fL — ABNORMAL HIGH (ref 78.0–100.0)
Monocytes Absolute: 0.7 10*3/uL (ref 0.1–1.0)
Monocytes Relative: 6 %
Neutro Abs: 10.9 10*3/uL — ABNORMAL HIGH (ref 1.7–7.7)
Neutrophils Relative %: 84 %
Platelets: 187 10*3/uL (ref 150–400)
RBC: 3.43 MIL/uL — ABNORMAL LOW (ref 3.87–5.11)
RDW: 12.1 % (ref 11.5–15.5)
WBC: 12.9 10*3/uL — ABNORMAL HIGH (ref 4.0–10.5)

## 2017-12-11 LAB — BASIC METABOLIC PANEL
Anion gap: 11 (ref 5–15)
BUN: 18 mg/dL (ref 8–23)
CO2: 24 mmol/L (ref 22–32)
Calcium: 9.3 mg/dL (ref 8.9–10.3)
Chloride: 103 mmol/L (ref 98–111)
Creatinine, Ser: 1.23 mg/dL — ABNORMAL HIGH (ref 0.44–1.00)
GFR calc Af Amer: 45 mL/min — ABNORMAL LOW (ref >60)
GFR calc non Af Amer: 39 mL/min — ABNORMAL LOW (ref >60)
Glucose, Bld: 182 mg/dL — ABNORMAL HIGH (ref 70–99)
Potassium: 4.7 mmol/L (ref 3.5–5.1)
Sodium: 138 mmol/L (ref 135–145)

## 2017-12-11 LAB — URIC ACID: Uric Acid, Serum: 5.3 mg/dL (ref 2.5–7.1)

## 2017-12-11 MED ORDER — ONDANSETRON 4 MG PO TBDP
4.0000 mg | ORAL_TABLET | Freq: Once | ORAL | Status: AC
  Administered 2017-12-11: 4 mg via ORAL
  Filled 2017-12-11: qty 1

## 2017-12-11 MED ORDER — TRAMADOL HCL 50 MG PO TABS: 25.0000 mg | ORAL_TABLET | Freq: Four times a day (QID) | ORAL | 0 refills | Status: DC | PRN

## 2017-12-11 MED ORDER — TRAMADOL HCL 50 MG PO TABS
50.0000 mg | ORAL_TABLET | Freq: Once | ORAL | Status: AC
  Administered 2017-12-11: 50 mg via ORAL
  Filled 2017-12-11: qty 1

## 2017-12-11 NOTE — ED Triage Notes (Signed)
Pt in c/o left shoulder pain and left thumb pain and swelling, first noted on Saturday, both are worse with movement and tender to touch, thumb joint noted to have redness and swelling

## 2017-12-11 NOTE — Discharge Instructions (Signed)
Please follow-up with Dr. Ihor Gully as soon as possible regarding your hand.  Apply ice to the affected area over a towel 20 minutes at a time up to 5 times a day.  You may take the medication I prescribed for pain.  Return for any new or worsening symptoms including shortness of breath, chest pain.

## 2017-12-11 NOTE — ED Provider Notes (Signed)
[facility] EMERGENCY DEPARTMENT Provider Note   CSN: [id number] Arrival date & time: 12/11/17  0753     History   Chief Complaint Chief Complaint  Patient presents with  . Arm Pain  . Hand Pain    HPI Brenda [name] is a 84 y.o. female with a past medical history of seizure, anemia, diabetes, reflux, hypertension, A. fib on chronic anticoagulation with Xarelto.  Patient presents with chief complaint of left shoulder, rib cage pain and left thumb pain.  The patient states that on Saturday she fell asleep on the left arm.  When she woke up she had pain in the left shoulder blade region and left side of her chest wall that is worse with movement of the shoulder and arm.  At the same time she started noticing some pain in her left first MCP joint which has progressively worsened, become erythematous and swollen.  She is able to move the joint however it hurts.  She is right-hand dominant.  She denies numbness or tingling.  She denies rashes, fevers, chills.  She has no history of gout.  She has no history of previous episodes of joint swelling.  HPI  Past Medical History:  Diagnosis Date  . Anemia   . Arthritis   . Basal cell carcinoma    nose  . Diabetes mellitus without complication ([place])    pt denies diabetes  . GERD (gastroesophageal reflux disease)   . Hiatal hernia   . HLD (hyperlipidemia)   . Hypertension   . Seizure [facility]) 2011    Patient Active Problem List   Diagnosis Date Noted  . Vaginal irritation 10/23/2017  . Acute pain of left shoulder 07/27/2017  . GERD (gastroesophageal reflux disease) 07/18/2017  . Gastrointestinal hemorrhage   . Symptomatic anemia 07/17/2017  . Iron deficiency anemia 05/09/2017  . Osteoporosis 11/24/2016  . Skin cancer of nose 11/23/2016  . 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 UTI (urinary tract infection) 10/26/2016  . Paroxysmal A-fib ([place]) 08/05/2016  . AKI (acute kidney injury) ([place]) 05/25/2016  . Essential  hypertension 05/05/2016  . Type 2 diabetes mellitus without complication, without long-term current use of insulin ([place]) 05/05/2016  . Hiatal hernia 05/05/2016  . History of CVA (cerebrovascular accident) without residual deficits 05/05/2016  . Memory change 05/05/2016  . AICD (automatic cardioverter/defibrillator) present 05/05/2016  . Back pain 05/05/2016    Past Surgical History:  Procedure Laterality Date  . CARDIAC DEFIBRILLATOR PLACEMENT    . CATARACT EXTRACTION Left 02/2001  . CHOLECYSTECTOMY    . COLONOSCOPY WITH PROPOFOL N/A 07/19/2017   Procedure: COLONOSCOPY WITH PROPOFOL;  Surgeon: Jerene Bears, MD;  Location: [facility];  Service: Gastroenterology;  Laterality: N/A;  . ESOPHAGOGASTRODUODENOSCOPY (EGD) WITH PROPOFOL N/A 07/19/2017   Procedure: ESOPHAGOGASTRODUODENOSCOPY (EGD) WITH PROPOFOL;  Surgeon: Jerene Bears, MD;  Location: [facility] ENDOSCOPY;  Service: Gastroenterology;  Laterality: N/A;  . LUMBAR LAMINECTOMY  06/11/2001  . LUMBAR LAMINECTOMY  02/09/2006  . LUMBAR LAMINECTOMY  07/07/2006   with spianal cord stimulator  . SPINAL CORD STIMULATOR REMOVAL  14/2009  . TUBAL LIGATION       OB History   None      Home Medications    Prior to Admission medications   Medication Sig Start Date End Date Taking? Authorizing Provider  atorvastatin (LIPITOR) 40 MG tablet TAKE 1 TABLET BY MOUTH DAILY 09/04/17   Sela Hilding, MD  Cholecalciferol (VITAMIN D-3) 1000 units CAPS Take 1,000 Units by mouth daily.  [provider]  ferrous sulfate 325 (65 FE) MG tablet Take 325 mg by mouth daily with breakfast.    [provider]  metoprolol tartrate (LOPRESSOR) 25 MG tablet Take 1 tablet (25 mg total) by mouth 2 (two) times daily. 10/17/17 01/15/18  Lovenia Kim, MD  Multiple Vitamins-Minerals (ICAPS AREDS 2 PO) Take 1 capsule by mouth 2 (two) times daily.     [provider]  omeprazole (PRILOSEC) 40 MG capsule Take 1 capsule (40 mg total) by mouth  daily. 05/20/16   Armbruster, Carlota Raspberry, MD  polyethylene glycol (MIRALAX / GLYCOLAX) packet Take 17 g by mouth daily. 07/21/17   Bonnita Hollow, MD  Propylene Glycol (SYSTANE COMPLETE) 0.6 % SOLN Place 1 drop into both eyes daily.    [provider]  Rivaroxaban (XARELTO) 15 MG TABS tablet Take 1 tablet (15 mg total) by mouth daily with supper. 06/08/17   Camnitz, Ocie Doyne, MD  tolterodine (DETROL LA) 2 MG 24 hr capsule Take 1 capsule (2 mg total) by mouth daily. 09/14/17   Zenia Resides, MD  verapamil (CALAN-SR) 240 MG CR tablet TAKE 1 TABLET BY MOUTH EVERY NIGHT AT BEDTIME 11/20/17   Sela Hilding, MD    Family History Family History  Problem Relation Age of Onset  . Diabetes Mother   . Heart failure Father   . Breast cancer Sister   . Heart failure Sister   . Diabetes Son   . Diabetes Sister   . Colon cancer Neg Hx   . Stomach cancer Neg Hx   . Rectal cancer Neg Hx   . Esophageal cancer Neg Hx     Social History Social History   Tobacco Use  . Smoking status: Never Smoker  . Smokeless tobacco: Never Used  Substance Use Topics  . Alcohol use: No  . Drug use: No     Allergies   Sotalol; Erythromycin; Augmentin [amoxicillin-pot clavulanate]; Crestor [rosuvastatin calcium]; Keflex [cephalexin]; and Septra [sulfamethoxazole-trimethoprim]   Review of Systems Review of Systems Ten systems reviewed and are negative for acute change, except as noted in the HPI.    Physical Exam Updated Vital Signs BP (!) 115/51 (BP Location: Right Arm)   Pulse 70   Temp 97.9 F (36.6 C) (Oral)   Resp 18   SpO2 100%   Physical Exam  Constitutional: She is oriented to person, place, and time. She appears well-developed and well-nourished. No distress.  HENT:  Head: Normocephalic and atraumatic.  Eyes: Conjunctivae are normal. No scleral icterus.  Neck: Normal range of motion.  Cardiovascular: Normal rate, regular rhythm and normal heart sounds. Exam reveals no  gallop and no friction rub.  No murmur heard. Pulmonary/Chest: Effort normal and breath sounds normal. No respiratory distress.      TTP along the left chest wall and the Left shoulder blade. No step off or crepitus. No rash.    Abdominal: Soft. Bowel sounds are normal. She exhibits no distension and no mass. There is no tenderness. There is no guarding.  Musculoskeletal: She exhibits edema and tenderness.  Left first MCP with swelling and erythema, tenderness at the joint.  She is able to mobilize it.  There is some erythema tracking along the extensor pollicis tendon.  Tender to palpation.  Normal pulse and cap refill  Neurological: She is alert and oriented to person, place, and time.  Skin: Skin is warm and dry. She is not diaphoretic.  Psychiatric: Her behavior is normal.  Nursing note and  vitals reviewed.            ED Treatments / Results  Labs (all labs ordered are listed, but only abnormal results are displayed) Labs Reviewed - No data to display  EKG None  Radiology No results found.  Procedures Procedures (including critical care time)  Medications Ordered in ED Medications - No data to display   Initial Impression / Assessment and Plan / ED Course  I have reviewed the triage vital signs and the nursing notes.  Pertinent labs & imaging results that were available during my care of the patient were reviewed by me and considered in my medical decision making (see chart for details).     Patient seen and shared visit with Dr. Alvino Chapel.  Patient has some sort of monoarticular arthropathy of the left thumb.  This is potentially gout or other inflammatory process.  She is able to move the joint and have very low suspicion for septic joint.  She also has some pain tracking along the extensor hallucis so it may represent a tenosynovitis.  Patient placed in thumb spica splint.  Pain improved with tramadol.  She will be given some at discharge.  Patient is  follow-up with orthopedic specialist.  Discussed return precautions.  The patient's chest pain and shoulder pain appears to be musculoskeletal and have no suspicion for any intrathoracic pathology.  Patient is breathing normally with normal oxygen saturations.  Patient appears appropriate for discharge at this time  Final Clinical Impressions(s) / ED Diagnoses   Final diagnoses:  None    ED Discharge Orders    None       Margarita Mail, PA-C 12/11/17 1658    Davonna Belling, MD 12/12/17 2023

## 2017-12-18 ENCOUNTER — Telehealth: Payer: Self-pay | Admitting: Cardiology

## 2017-12-18 NOTE — Telephone Encounter (Signed)
Remote transmission received. Informed pt that I would have Device Tech RN return her call after she reviews the transmission. Pt verbalized understanding.

## 2017-12-18 NOTE — Telephone Encounter (Signed)
Spoke with pt informed her that I had received the transmission and reviewed and she has not had any episodes where her device has treated her. Pt and husband voiced understanding

## 2017-12-21 ENCOUNTER — Encounter (HOSPITAL_COMMUNITY): Payer: Self-pay

## 2017-12-21 ENCOUNTER — Emergency Department (HOSPITAL_COMMUNITY): Payer: Medicare Other

## 2017-12-21 ENCOUNTER — Emergency Department (HOSPITAL_COMMUNITY)
Admission: EM | Admit: 2017-12-21 | Discharge: 2017-12-22 | Disposition: A | Discharge status: Home / Self Care | Payer: Medicare Other | Attending: Emergency Medicine | Admitting: Emergency Medicine

## 2017-12-21 ENCOUNTER — Other Ambulatory Visit: Payer: Self-pay

## 2017-12-21 DIAGNOSIS — R52 Pain, unspecified: Secondary | ICD-10-CM | POA: Diagnosis not present

## 2017-12-21 DIAGNOSIS — S322XXA Fracture of coccyx, initial encounter for closed fracture: Secondary | ICD-10-CM | POA: Insufficient documentation

## 2017-12-21 DIAGNOSIS — E119 Type 2 diabetes mellitus without complications: Secondary | ICD-10-CM | POA: Diagnosis not present

## 2017-12-21 DIAGNOSIS — N3001 Acute cystitis with hematuria: Secondary | ICD-10-CM | POA: Insufficient documentation

## 2017-12-21 DIAGNOSIS — N390 Urinary tract infection, site not specified: Secondary | ICD-10-CM

## 2017-12-21 DIAGNOSIS — R319 Hematuria, unspecified: Secondary | ICD-10-CM | POA: Diagnosis not present

## 2017-12-21 DIAGNOSIS — Y929 Unspecified place or not applicable: Secondary | ICD-10-CM | POA: Insufficient documentation

## 2017-12-21 DIAGNOSIS — Z7901 Long term (current) use of anticoagulants: Secondary | ICD-10-CM | POA: Insufficient documentation

## 2017-12-21 DIAGNOSIS — Y939 Activity, unspecified: Secondary | ICD-10-CM | POA: Insufficient documentation

## 2017-12-21 DIAGNOSIS — S3992XA Unspecified injury of lower back, initial encounter: Secondary | ICD-10-CM | POA: Diagnosis present

## 2017-12-21 DIAGNOSIS — I10 Essential (primary) hypertension: Secondary | ICD-10-CM | POA: Diagnosis not present

## 2017-12-21 DIAGNOSIS — Z79899 Other long term (current) drug therapy: Secondary | ICD-10-CM | POA: Insufficient documentation

## 2017-12-21 DIAGNOSIS — M47815 Spondylosis without myelopathy or radiculopathy, thoracolumbar region: Secondary | ICD-10-CM | POA: Diagnosis not present

## 2017-12-21 DIAGNOSIS — Y999 Unspecified external cause status: Secondary | ICD-10-CM | POA: Insufficient documentation

## 2017-12-21 DIAGNOSIS — S3210XA Unspecified fracture of sacrum, initial encounter for closed fracture: Secondary | ICD-10-CM | POA: Insufficient documentation

## 2017-12-21 DIAGNOSIS — W19XXXA Unspecified fall, initial encounter: Secondary | ICD-10-CM | POA: Insufficient documentation

## 2017-12-21 DIAGNOSIS — M5125 Other intervertebral disc displacement, thoracolumbar region: Secondary | ICD-10-CM | POA: Diagnosis not present

## 2017-12-21 DIAGNOSIS — M25512 Pain in left shoulder: Secondary | ICD-10-CM | POA: Diagnosis not present

## 2017-12-21 DIAGNOSIS — I959 Hypotension, unspecified: Secondary | ICD-10-CM | POA: Diagnosis not present

## 2017-12-21 DIAGNOSIS — S4992XA Unspecified injury of left shoulder and upper arm, initial encounter: Secondary | ICD-10-CM | POA: Diagnosis not present

## 2017-12-21 DIAGNOSIS — W19XXXD Unspecified fall, subsequent encounter: Secondary | ICD-10-CM

## 2017-12-21 DIAGNOSIS — M25511 Pain in right shoulder: Secondary | ICD-10-CM | POA: Insufficient documentation

## 2017-12-21 LAB — URINALYSIS, ROUTINE W REFLEX MICROSCOPIC
Bilirubin Urine: NEGATIVE
Glucose, UA: NEGATIVE mg/dL
Ketones, ur: NEGATIVE mg/dL
Nitrite: NEGATIVE
Protein, ur: NEGATIVE mg/dL
Specific Gravity, Urine: 1.008 (ref 1.005–1.030)
WBC, UA: >50 WBC/hpf — ABNORMAL HIGH (ref 0–5)
pH: 6 (ref 5.0–8.0)

## 2017-12-21 LAB — BASIC METABOLIC PANEL
Anion gap: 11 (ref 5–15)
BUN: 19 mg/dL (ref 8–23)
CO2: 26 mmol/L (ref 22–32)
Calcium: 9.2 mg/dL (ref 8.9–10.3)
Chloride: 101 mmol/L (ref 98–111)
Creatinine, Ser: 1.17 mg/dL — ABNORMAL HIGH (ref 0.44–1.00)
GFR calc Af Amer: 48 mL/min — ABNORMAL LOW (ref >60)
GFR calc non Af Amer: 42 mL/min — ABNORMAL LOW (ref >60)
Glucose, Bld: 128 mg/dL — ABNORMAL HIGH (ref 70–99)
Potassium: 4 mmol/L (ref 3.5–5.1)
Sodium: 138 mmol/L (ref 135–145)

## 2017-12-21 LAB — CBC
HCT: 34.5 % — ABNORMAL LOW (ref 36.0–46.0)
Hemoglobin: 11.6 g/dL — ABNORMAL LOW (ref 12.0–15.0)
MCH: 33.3 pg (ref 26.0–34.0)
MCHC: 33.6 g/dL (ref 30.0–36.0)
MCV: 99.1 fL (ref 78.0–100.0)
Platelets: 243 10*3/uL (ref 150–400)
RBC: 3.48 MIL/uL — ABNORMAL LOW (ref 3.87–5.11)
RDW: 12.8 % (ref 11.5–15.5)
WBC: 9.2 10*3/uL (ref 4.0–10.5)

## 2017-12-21 MED ORDER — CIPROFLOXACIN HCL 500 MG PO TABS: 500.0000 mg | ORAL_TABLET | Freq: Two times a day (BID) | ORAL | 0 refills | Status: AC

## 2017-12-21 MED ORDER — OXYCODONE-ACETAMINOPHEN 5-325 MG PO TABS: 1.0000 | ORAL_TABLET | Freq: Four times a day (QID) | ORAL | 0 refills | Status: AC | PRN

## 2017-12-21 MED ORDER — ONDANSETRON HCL 4 MG PO TABS: 4.0000 mg | ORAL_TABLET | Freq: Four times a day (QID) | ORAL | 0 refills | Status: DC

## 2017-12-21 MED ORDER — OXYCODONE-ACETAMINOPHEN 5-325 MG PO TABS
1.0000 | ORAL_TABLET | Freq: Once | ORAL | Status: AC
  Administered 2017-12-21: 1 via ORAL
  Filled 2017-12-21: qty 1

## 2017-12-21 MED ORDER — ONDANSETRON 4 MG PO TBDP
4.0000 mg | ORAL_TABLET | Freq: Once | ORAL | Status: AC
  Administered 2017-12-21: 4 mg via ORAL
  Filled 2017-12-21: qty 1

## 2017-12-21 NOTE — ED Triage Notes (Addendum)
Per EMS- Patient reported that she had a fall a week ago and fell on her tailbone. Patient reports that the tailbone pain is getting worse. At the time of the fall, patient denied hitting her head or having LOC.  Patient reported at the end of triage that her left shoulder was hurting rom the fall 2 weeks ago.

## 2017-12-21 NOTE — ED Notes (Signed)
Pt voided using bedpan, pt voided as well as had a smear of stool. Pt able to lift hips independently.

## 2017-12-21 NOTE — ED Notes (Signed)
Pt and family expressed that pt had a a shoulder x-ray but it was her tailbone that she fell on and should have been x-rayed.

## 2017-12-21 NOTE — Discharge Instructions (Addendum)
CT scan showed that you have a broken sacrum and coccyx. I have listed an orthopedic office, [facility], for you to follow-up with. Please call them in the morning.  You have no special restrictions. You may continue to walk as you normally do. I have prescribed you medication for pain to talk. You may place ice on your tailbone as well for additional relief.  Your urine also showed a UTI. Please take the full dose of antibiotics to ensure that it is treated.  Thank you for allowing [place] to take care of you today! I wish you a speedy healing. Be safe and mindful of falls.

## 2017-12-21 NOTE — ED Provider Notes (Addendum)
[facility] DEPT Provider Note  CSN: [id number] Arrival date & time: 12/21/17  1551    History   Chief Complaint Chief Complaint  Patient presents with  . Tailbone Pain  . Fall  . Shoulder Pain    HPI Brenda [name] is a 84 y.o. female with a medical history of seizures, CVA, HTN, HLD, GERD and Type 2 DM who presented to the ED 1 week following a fall. Patient states that she was ambulating in the kitchen when she fell from feeling weak. She reports following backward onto her buttocks. Denies any preceding symptoms of vision changes, chest pain, SOB, palpitations, dizziness/lightheadedness or paresthesias. At baseline, patient ambulates with a walker and has unilateral weakness from a stroke. Denies any head trauma, LOC or AMS. Patient currently endorses right shoulder and coccyx pain that is describe as aching and throbbing. No extremity weakness, paresthesias, decreased ROM or color/temperature changes. Coccyx pain is worse with ambulation. Patient has not taken any medications for relief prior to ED arrival. Patient has been ambulatory since the fall last week without issue.  Past Medical History:  Diagnosis Date  . Anemia   . Arthritis   . Basal cell carcinoma    nose  . Diabetes mellitus without complication ([place])    pt denies diabetes  . GERD (gastroesophageal reflux disease)   . Hiatal hernia   . HLD (hyperlipidemia)   . Hypertension   . Seizure [facility]) 2011    Patient Active Problem List   Diagnosis Date Noted  . Vaginal irritation 10/23/2017  . Acute pain of left shoulder 07/27/2017  . GERD (gastroesophageal reflux disease) 07/18/2017  . Gastrointestinal hemorrhage   . Symptomatic anemia 07/17/2017  . Iron deficiency anemia 05/09/2017  . Osteoporosis 11/24/2016  . Skin cancer of nose 11/23/2016  . 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 UTI (urinary tract infection) 10/26/2016  . Paroxysmal A-fib ([place]) 08/05/2016  . AKI (acute kidney  injury) ([place]) 05/25/2016  . Essential hypertension 05/05/2016  . Type 2 diabetes mellitus without complication, without long-term current use of insulin ([place]) 05/05/2016  . Hiatal hernia 05/05/2016  . History of CVA (cerebrovascular accident) without residual deficits 05/05/2016  . Memory change 05/05/2016  . AICD (automatic cardioverter/defibrillator) present 05/05/2016  . Back pain 05/05/2016    Past Surgical History:  Procedure Laterality Date  . CARDIAC DEFIBRILLATOR PLACEMENT    . CATARACT EXTRACTION Left 02/2001  . CHOLECYSTECTOMY    . COLONOSCOPY WITH PROPOFOL N/A 07/19/2017   Procedure: COLONOSCOPY WITH PROPOFOL;  Surgeon: Jerene Bears, MD;  Location: [facility];  Service: Gastroenterology;  Laterality: N/A;  . ESOPHAGOGASTRODUODENOSCOPY (EGD) WITH PROPOFOL N/A 07/19/2017   Procedure: ESOPHAGOGASTRODUODENOSCOPY (EGD) WITH PROPOFOL;  Surgeon: Jerene Bears, MD;  Location: [facility] ENDOSCOPY;  Service: Gastroenterology;  Laterality: N/A;  . LUMBAR LAMINECTOMY  06/11/2001  . LUMBAR LAMINECTOMY  02/09/2006  . LUMBAR LAMINECTOMY  07/07/2006   with spianal cord stimulator  . SPINAL CORD STIMULATOR REMOVAL  14/2009  . TUBAL LIGATION       OB History   None      Home Medications    Prior to Admission medications   Medication Sig Start Date End Date Taking? Authorizing Provider  atorvastatin (LIPITOR) 40 MG tablet TAKE 1 TABLET BY MOUTH DAILY 09/04/17  Yes Sela Hilding, MD  Cholecalciferol (VITAMIN D-3) 1000 units CAPS Take 1,000 Units by mouth daily.   Yes [provider]  ferrous sulfate 325 (65 FE) MG tablet Take 325 mg by mouth  daily with breakfast.   Yes [provider]  metoprolol tartrate (LOPRESSOR) 25 MG tablet Take 1 tablet (25 mg total) by mouth 2 (two) times daily. 10/17/17 01/15/18 Yes Lovenia Kim, MD  Multiple Vitamins-Minerals (ICAPS AREDS 2 PO) Take 1 capsule by mouth 2 (two) times daily.    Yes [provider]  omeprazole  (PRILOSEC) 40 MG capsule Take 1 capsule (40 mg total) by mouth daily. 05/20/16  Yes Armbruster, Carlota Raspberry, MD  polyethylene glycol (MIRALAX / GLYCOLAX) packet Take 17 g by mouth daily. 07/21/17  Yes Bonnita Hollow, MD  Propylene Glycol (SYSTANE COMPLETE) 0.6 % SOLN Place 1 drop into both eyes daily.   Yes [provider]  Rivaroxaban (XARELTO) 15 MG TABS tablet Take 1 tablet (15 mg total) by mouth daily with supper. 06/08/17  Yes Camnitz, Ocie Doyne, MD  tolterodine (DETROL LA) 2 MG 24 hr capsule Take 1 capsule (2 mg total) by mouth daily. 09/14/17  Yes Hensel, Jamal Collin, MD  verapamil (CALAN-SR) 240 MG CR tablet TAKE 1 TABLET BY MOUTH EVERY NIGHT AT BEDTIME 11/20/17  Yes Sela Hilding, MD  ciprofloxacin (CIPRO) 500 MG tablet Take 1 tablet (500 mg total) by mouth 2 (two) times daily for 7 days. 12/21/17 12/28/17  Bethann Qualley, Alvie Heidelberg I, PA-C  ondansetron (ZOFRAN) 4 MG tablet Take 1 tablet (4 mg total) by mouth every 6 (six) hours. 12/21/17   Lowella Kindley, Alvie Heidelberg I, PA-C  oxyCODONE-acetaminophen (PERCOCET/ROXICET) 5-325 MG tablet Take 1-2 tablets by mouth every 6 (six) hours as needed for up to 5 days for severe pain. 12/21/17 12/26/17  Jamirah Zelaya, Alvie Heidelberg I, PA-C  traMADol (ULTRAM) 50 MG tablet Take 0.5-1 tablets (25-50 mg total) by mouth every 6 (six) hours as needed. 12/11/17   Margarita Mail, PA-C    Family History Family History  Problem Relation Age of Onset  . Diabetes Mother   . Heart failure Father   . Breast cancer Sister   . Heart failure Sister   . Diabetes Son   . Diabetes Sister   . Colon cancer Neg Hx   . Stomach cancer Neg Hx   . Rectal cancer Neg Hx   . Esophageal cancer Neg Hx     Social History Social History   Tobacco Use  . Smoking status: Never Smoker  . Smokeless tobacco: Never Used  Substance Use Topics  . Alcohol use: No  . Drug use: No     Allergies   Sotalol; Erythromycin; Augmentin [amoxicillin-pot clavulanate]; Crestor [rosuvastatin calcium]; Keflex  [cephalexin]; and Septra [sulfamethoxazole-trimethoprim]   Review of Systems Review of Systems  Constitutional: Negative.   HENT: Negative.   Eyes: Negative.   Respiratory: Negative for chest tightness and shortness of breath.   Cardiovascular: Negative for chest pain and palpitations.  Musculoskeletal: Positive for arthralgias. Negative for back pain.  Skin: Negative.   Neurological: Positive for weakness. Negative for dizziness, light-headedness and numbness.       Residual weakness from stroke  Hematological: Negative.   Psychiatric/Behavioral: Negative.    Physical Exam Updated Vital Signs BP (!) 161/78   Pulse 85   Temp 98 F (36.7 C) (Oral)   Resp 18   Ht 5\' 1"  (1.549 m)   Wt 59 kg   SpO2 99%   BMI 24.56 kg/m   Physical Exam  Constitutional: She is cooperative.  Thin  HENT:  Head: Normocephalic and atraumatic.  Eyes: Pupils are equal, round, and reactive to light. Conjunctivae, EOM and lids are normal.  Neck:  Normal range of motion and full passive range of motion without pain. Neck supple. No spinous process tenderness and no muscular tenderness present. Normal range of motion present.  Cardiovascular: Normal rate, regular rhythm, normal heart sounds, intact distal pulses and normal pulses.  No murmur heard. Pulmonary/Chest: Breath sounds normal.  Musculoskeletal:       Right shoulder: She exhibits tenderness. She exhibits normal range of motion, no bony tenderness and no deformity.       Right hip: Normal.       Left hip: Normal.       Lumbar back: She exhibits decreased range of motion and tenderness. She exhibits no bony tenderness.       Back:  Bilateral lumbar muscular tenderness to palpation. Grimaces with movements. No midline tenderness. Unable to palpate sacrum completely due to patient positioning.   Neurological: She is alert. She displays no atrophy. No sensory deficit. She exhibits normal muscle tone.  Skin: Skin is warm and intact. Capillary  refill takes 2 to 3 seconds.  Nursing note and vitals reviewed.  ED Treatments / Results  Labs (all labs ordered are listed, but only abnormal results are displayed) Labs Reviewed  CBC - Abnormal; Notable for the following components:      Result Value   RBC 3.48 (*)    Hemoglobin 11.6 (*)    HCT 34.5 (*)    All other components within normal limits  URINALYSIS, ROUTINE W REFLEX MICROSCOPIC - Abnormal; Notable for the following components:   APPearance HAZY (*)    Hgb urine dipstick LARGE (*)    Leukocytes, UA LARGE (*)    WBC, UA >50 (*)    Bacteria, UA MANY (*)    All other components within normal limits  BASIC METABOLIC PANEL - Abnormal; Notable for the following components:   Glucose, Bld 128 (*)    Creatinine, Ser 1.17 (*)    GFR calc non Af Amer 42 (*)    GFR calc Af Amer 48 (*)    All other components within normal limits  URINE CULTURE    EKG None  Radiology Ct Lumbar Spine Wo Contrast  Result Date: 12/21/2017 CLINICAL DATA:  Fall 1 week ago with back pain. EXAM: CT LUMBAR SPINE WITHOUT CONTRAST TECHNIQUE: Multidetector CT imaging of the lumbar spine was performed without intravenous contrast administration. Multiplanar CT image reconstructions were also generated. COMPARISON:  Lumbar spine CT 09/13/2017 FINDINGS: Segmentation: 5 lumbar type vertebrae. Alignment: Grade 1 retrolisthesis at L1-L2 Vertebrae: There is posterior spinal fusion hardware extending from L2-S1. There are interbody spacers at all levels. There are anterior screws also at the L5-S1 level. Mild lucency along the course of the right L3 screw is unchanged. No other perihardware lucency. Post augmentation changes at T12 without further height loss. Compression deformity of L2 is unchanged, with mild retropulsion. There is posterior decompression of the spinal canal from L2-L5. No new compression fracture. Paraspinal and other soft tissues: Small posterior paraspinal fluid collection is unchanged. There is  calcific aortic atherosclerosis. Disc levels: T11-T12: Unremarkable. T12-L1: Partially calcified disc protrusion and mild facet hypertrophy with mild spinal canal stenosis. L1-L2: Posterior decompression of the spinal canal. No spinal canal stenosis. Neural foramina are widely patent. L2-L3: Posterior decompression with widely patent spinal canal. No neural foraminal stenosis. L3-L4: Posterior decompression with widely patent spinal canal and neural foramina. L4-L5: Posterior decompression with widely patent spinal canal. Unchanged appearance arthrodesis. L5-S1: No spinal canal stenosis or neural impingement. Unchanged appearance of arthrodesis. IMPRESSION:  1. Unchanged examination compared to 09/13/2017. No new fracture or increased vertebral body height loss. The augmented T12 fracture and mildly retropulsed L2 fracture are unchanged. 2. Unchanged appearance of small posterior paraspinal fluid collection. 3. Unchanged mild perihardware lucency along the course of the right L3 transpedicular screw. Electronically Signed   By: Ulyses Jarred M.D.   On: 12/21/2017 22:22   Ct Pelvis Wo Contrast  Result Date: 12/21/2017 CLINICAL DATA:  Worsening tailbone pain after fall last week. EXAM: CT PELVIS WITHOUT CONTRAST TECHNIQUE: Multidetector CT imaging of the pelvis was performed following the standard protocol without intravenous contrast. COMPARISON:  None. FINDINGS: Urinary Tract: Physiologically distended urinary bladder without focal mural thickening or calculus. Bowel: Scattered sigmoid diverticulosis without acute diverticulitis. No bowel obstruction. Vascular/Lymphatic: Aortoiliac atherosclerosis without aneurysm. No adenopathy. Reproductive:  Atrophic uterus.  No adnexal mass. Other:  No free air nor free fluid. Musculoskeletal: Posterior lumbar fusion of the included L4 through S1 vertebrae with laminectomy defects noted. Bone graft material is identified about the pedicle screws with donor site involving the  right posterior iliac. Subtle lucency is identified of the S4 segment of the sacrum (series 15/67) on the left and the uppermost coccygeal segment also on the left consistent with nondisplaced fractures, series 15/63. IMPRESSION: Acute nondisplaced fractures of the left S4 and left uppermost coccygeal segment segments of the spine. Lumbar spinal decompression and fusion with bone graft material as above stated. Electronically Signed   By: Ashley Royalty M.D.   On: 12/21/2017 22:27   Dg Shoulder Left  Result Date: 12/21/2017 CLINICAL DATA:  Left shoulder pain after fall 1 week ago. EXAM: LEFT SHOULDER - 2+ VIEW COMPARISON:  None. FINDINGS: Mild degenerate change of the [facility] joint. Glenohumeral joint is unremarkable. No evidence of acute fracture or dislocation. IMPRESSION: No acute findings. Electronically Signed   By: Marin Olp M.D.   On: 12/21/2017 17:12    Procedures Procedures (including critical care time)  Medications Ordered in ED Medications  oxyCODONE-acetaminophen (PERCOCET/ROXICET) 5-325 MG per tablet 1 tablet (1 tablet Oral Given 12/21/17 2110)  ondansetron (ZOFRAN-ODT) disintegrating tablet 4 mg (4 mg Oral Given 12/21/17 2110)   Initial Impression / Assessment and Plan / ED Course  Triage vital signs and the nursing notes have been reviewed.  Pertinent labs & imaging results that were available during care of the patient were reviewed and considered in medical decision making (see chart for details).  Patient presents 1 week following a fall. She was initially seen in the ED with upper extremity injuries following this accident on 12/11/17 with no mention of lower extremity pain. Currently complains of right shoulder and coccyx pain. Patient has been able to ambulate as she normally does and perform normal ROMs since the accident. Difficult to appreciate lower extremity bony tenderness due to patient position. Given pt's frail, osteopenic-like appearance, CT imaging of lumbar and pelvis  will be ordered for better evaluation of possible injuries.  Clinical Course as of Dec 22 2327  Thu Dec 21, 2017  1941 No fractures or dislocations seen on shoulder x-ray. Degenerative changes seen at [facility] joint.   [GM]  2244 No acute changes in lumbar spine on CT spine. Acute nondisplaced fractures of the left S4 and left uppermost coccygeal segment segments of the spine.  Case discussed with Dr. Duffy Bruce. Patient stable for discharge with ortho follow-up.   [GM]  2322 Patient later complained of urinary frequency with no other complaints. UA ordered to further evaluate for possible  UTI. UA suggestive of UTI. Allergy to Keflex and Bactrim will prescribe Cipro. Urine culture ordered for sensitivities.   [GM]    Clinical Course User Index [GM] Shellby Schlink, Jonelle Sports, PA-C    Final Clinical Impressions(s) / ED Diagnoses   Dispo: Home. After thorough clinical evaluation, this patient is determined to be medically stable and can be safely discharged with the previously mentioned treatment and/or outpatient follow-up/referral(s). At this time, there are no other apparent medical conditions that require further screening, evaluation or treatment.   Final diagnoses:  Fall, subsequent encounter  Closed fracture of sacrum and coccyx, initial encounter [facility])  Urinary tract infection with hematuria, site unspecified    ED Discharge Orders         Ordered    oxyCODONE-acetaminophen (PERCOCET/ROXICET) 5-325 MG tablet  Every 6 hours PRN     12/21/17 2248    ondansetron (ZOFRAN) 4 MG tablet  Every 6 hours     12/21/17 2248    ciprofloxacin (CIPRO) 500 MG tablet  2 times daily     12/21/17 2328            Maury Dus I, PA-C 12/21/17 2315    Emersen Carroll, [place] I, PA-C 12/21/17 2329    Duffy Bruce, MD 12/23/17 1216

## 2017-12-21 NOTE — ED Notes (Signed)
Attempted I&O cath of pt unsuccessful however pt was able to void into a bedpan and had been cleaned of stool prior to voiding.

## 2017-12-23 LAB — URINE CULTURE

## 2017-12-26 DIAGNOSIS — S32110A Nondisplaced Zone I fracture of sacrum, initial encounter for closed fracture: Secondary | ICD-10-CM | POA: Diagnosis not present

## 2018-01-16 ENCOUNTER — Encounter: Payer: Self-pay | Admitting: Cardiology

## 2018-01-16 ENCOUNTER — Ambulatory Visit (INDEPENDENT_AMBULATORY_CARE_PROVIDER_SITE_OTHER): Payer: Medicare Other | Admitting: Cardiology

## 2018-01-16 VITALS — BP 126/70 | HR 109 | Ht 61.0 in | Wt 130.0 lb

## 2018-01-16 DIAGNOSIS — I472 Ventricular tachycardia, unspecified: Secondary | ICD-10-CM

## 2018-01-16 DIAGNOSIS — I480 Paroxysmal atrial fibrillation: Secondary | ICD-10-CM

## 2018-01-16 DIAGNOSIS — I10 Essential (primary) hypertension: Secondary | ICD-10-CM | POA: Diagnosis not present

## 2018-01-16 MED ORDER — ATORVASTATIN CALCIUM 40 MG PO TABS: 40.0000 mg | ORAL_TABLET | Freq: Every day | ORAL | 2 refills | Status: DC

## 2018-01-16 MED ORDER — VERAPAMIL HCL ER 240 MG PO TBCR: 240.0000 mg | EXTENDED_RELEASE_TABLET | Freq: Every day | ORAL | 2 refills | Status: DC

## 2018-01-16 MED ORDER — RIVAROXABAN 15 MG PO TABS: 15.0000 mg | ORAL_TABLET | Freq: Every day | ORAL | 2 refills | Status: DC

## 2018-01-16 MED ORDER — METOPROLOL TARTRATE 50 MG PO TABS: 50.0000 mg | ORAL_TABLET | Freq: Two times a day (BID) | ORAL | 6 refills | Status: DC

## 2018-01-16 NOTE — Patient Instructions (Addendum)
Medication Instructions:  Your physician has recommended you make the following change in your medication:  1. INCREASE Metoprolol Tartrate to 50 mg twice a day  If you need a refill on your cardiac medications before your next appointment, please call your pharmacy.   Lab work: None ordered  Testing/Procedures: None ordered  Follow-Up: Remote monitoring is used to monitor your  ICD from home. This monitoring reduces the number of office visits required to check your device to one time per year. It allows [place] to keep an eye on the functioning of your device to ensure it is working properly. You are scheduled for a device check from home on 02/01/2018. You may send your transmission at any time that day. If you have a wireless device, the transmission will be sent automatically. After your physician reviews your transmission, you will receive a postcard with your next transmission date.  At [facility], you and your health needs are our priority.  As part of our continuing mission to provide you with exceptional heart care, we have created designated Provider Care Teams.  These Care Teams include your primary Cardiologist (physician) and Advanced Practice Providers (APPs -  Physician Assistants and Nurse Practitioners) who all work together to provide you with the care you need, when you need it. You will need a follow up appointment in 6 months.  Please call our office 2 months in advance to schedule this appointment.  You may see Dr. Curt Bears or one of the following Advanced Practice Providers on your designated Care Team:   Chanetta Marshall, NP . Tommye Standard, PA-C  Thank you for choosing [facility]!!   Trinidad Curet, RN [phone]

## 2018-01-16 NOTE — Addendum Note (Signed)
Addended by: Stanton Kidney on: 01/16/2018 11:58 AM   Modules accepted: Orders

## 2018-01-16 NOTE — Addendum Note (Signed)
Addended by: Marlis Edelson C on: 01/16/2018 04:14 PM   Modules accepted: Orders

## 2018-01-16 NOTE — Progress Notes (Signed)
Electrophysiology Office Note   Date:  01/16/2018   ID:  Brenda [name], DOB [birth year], MRN [id number]  PCP:  Brenda Hilding, MD  Primary Electrophysiologist:  Brenda Casimir Meredith Leeds, MD    No chief complaint on file.    History of Present Illness: Brenda [name] is a 84 y.o. female who presents today for electrophysiology evaluation.   She has a history of CVA, diabetes, and hypertension. She has a defibrillator in place, with note suggesting that it was due to history of ventricular tachycardia. She had an echo done in November 2017 that showed a preserved ejection fraction of 60%, moderate tricuspid regurgitation, and a severely enlarged left atrium.  According to her family, she was started on sotalol a few years ago. On the sotalol, she had, what her daughter calls seizures. They also describe that she had a flat line on her EKG. She had an ICD placed at that time. Sotalol was thus stopped.   She presented to the emergency room on 07/30/16 after multiple ICD shocks. She says that that day, she felt very fatigued and short of breath. She was not able to move around her house, and had difficulty even taking one step. On presentation to the emergency room, her device was interrogated which showed atrial fibrillation with rapid rates into the 190s.   Today, denies symptoms of palpitations, chest pain, shortness of breath, orthopnea, PND, lower extremity edema, claudication, dizziness, presyncope, syncope, bleeding, or neurologic sequela. The patient is tolerating medications without difficulties.  He is overall feeling well.  She is mourning the loss of her husband who died in 07-Sep-2022.  She is not happy about the hospitalization at the time that he died.  She feels like his care was mismanaged.  Past Medical History:  Diagnosis Date  . Anemia   . Arthritis   . Basal cell carcinoma    nose  . Diabetes mellitus without complication ([place])    pt denies diabetes  . GERD  (gastroesophageal reflux disease)   . Hiatal hernia   . HLD (hyperlipidemia)   . Hypertension   . Seizure ([place]) 2011   Past Surgical History:  Procedure Laterality Date  . CARDIAC DEFIBRILLATOR PLACEMENT    . CATARACT EXTRACTION Left 02/2001  . CHOLECYSTECTOMY    . COLONOSCOPY WITH PROPOFOL N/A 07/19/2017   Procedure: COLONOSCOPY WITH PROPOFOL;  Surgeon: Jerene Bears, MD;  Location: [facility];  Service: Gastroenterology;  Laterality: N/A;  . ESOPHAGOGASTRODUODENOSCOPY (EGD) WITH PROPOFOL N/A 07/19/2017   Procedure: ESOPHAGOGASTRODUODENOSCOPY (EGD) WITH PROPOFOL;  Surgeon: Jerene Bears, MD;  Location: [facility] ENDOSCOPY;  Service: Gastroenterology;  Laterality: N/A;  . LUMBAR LAMINECTOMY  06/11/2001  . LUMBAR LAMINECTOMY  02/09/2006  . LUMBAR LAMINECTOMY  07/07/2006   with spianal cord stimulator  . SPINAL CORD STIMULATOR REMOVAL  14/2009  . TUBAL LIGATION       Current Outpatient Medications  Medication Sig Dispense Refill  . atorvastatin (LIPITOR) 40 MG tablet TAKE 1 TABLET BY MOUTH DAILY 30 tablet 0  . Cholecalciferol (VITAMIN D-3) 1000 units CAPS Take 1,000 Units by mouth daily.    . ferrous sulfate 325 (65 FE) MG tablet Take 325 mg by mouth daily with breakfast.    . Multiple Vitamins-Minerals (ICAPS AREDS 2 PO) Take 1 capsule by mouth 2 (two) times daily.     Marland Kitchen omeprazole (PRILOSEC) 40 MG capsule Take 1 capsule (40 mg total) by mouth daily. 90 capsule 1  . ondansetron (ZOFRAN) 4 MG tablet Take 1 tablet (  4 mg total) by mouth every 6 (six) hours. 12 tablet 0  . polyethylene glycol (MIRALAX / GLYCOLAX) packet Take 17 g by mouth daily. 14 each 0  . Propylene Glycol (SYSTANE COMPLETE) 0.6 % SOLN Place 1 drop into both eyes daily.    . Rivaroxaban (XARELTO) 15 MG TABS tablet Take 1 tablet (15 mg total) by mouth daily with supper. 90 tablet 1  . tolterodine (DETROL LA) 2 MG 24 hr capsule Take 1 capsule (2 mg total) by mouth daily. 90 capsule 1  . traMADol (ULTRAM) 50 MG tablet Take  0.5-1 tablets (25-50 mg total) by mouth every 6 (six) hours as needed. 15 tablet 0  . verapamil (CALAN-SR) 240 MG CR tablet TAKE 1 TABLET BY MOUTH EVERY NIGHT AT BEDTIME 90 tablet 0  . metoprolol tartrate (LOPRESSOR) 25 MG tablet Take 1 tablet (25 mg total) by mouth 2 (two) times daily. 180 tablet 3   No current facility-administered medications for this visit.     Allergies:   Sotalol; Erythromycin; Augmentin [amoxicillin-pot clavulanate]; Crestor [rosuvastatin calcium]; Keflex [cephalexin]; and Septra [sulfamethoxazole-trimethoprim]   Social History:  The patient  reports that she has never smoked. She has never used smokeless tobacco. She reports that she does not drink alcohol or use drugs.   Family History:  The patient's family history includes Breast cancer in her sister; Diabetes in her mother, sister, and son; Heart failure in her father and sister.    ROS:  Please see the history of present illness.   Otherwise, review of systems is positive for Teague, leg pain, visual changes, constipation, anxiety, back pain.   All other systems are reviewed and negative.   PHYSICAL EXAM: VS:  BP 126/70   Pulse (!) 109   Ht 5\' 1"  (1.549 m)   Wt 130 lb (59 kg)   BMI 24.56 kg/m  , BMI Body mass index is 24.56 kg/m. GEN: Well nourished, well developed, in no acute distress  HEENT: normal  Neck: no JVD, carotid bruits, or masses Cardiac: RRR; no murmurs, rubs, or gallops,no edema  Respiratory:  clear to auscultation bilaterally, normal work of breathing GI: soft, nontender, nondistended, + BS MS: no deformity or atrophy  Skin: warm and dry, device site well healed Neuro:  Strength and sensation are intact Psych: euthymic mood, full affect  EKG:  EKG is ordered today. Personal review of the ekg ordered shows sinus tachycardia, rate 109  Personal review of the device interrogation today. Results in [place]: 04/20/2017: ALT 12 12/21/2017: BUN 19; Creatinine, Ser 1.17;  Hemoglobin 11.6; Platelets 243; Potassium 4.0; Sodium 138    Lipid Panel  No results found for: CHOL, TRIG, HDL, CHOLHDL, VLDL, LDLCALC, LDLDIRECT   Wt Readings from Last 3 Encounters:  01/16/18 130 lb (59 kg)  12/21/17 130 lb (59 kg)  10/17/17 132 lb 12.8 oz (60.2 kg)      Other studies Reviewed: Additional studies/ records that were reviewed today include: PCP notes  TTE 01/28/16 - Outside records reviewed EF 65%, LA moderately enlarged  ASSESSMENT AND PLAN:  1.  Hypertension: Well-controlled.  No changes.  2.  Inducible ventricular tachycardia: [employer] ICD in place.  Functioning appropriately.  No changes at this time.  3. Paroxysmal atrial fibrillation: On Xarelto.  Rate controlled with metoprolol.  Tolerating well.  He has had some short episodes of an atrial tachycardia.  Increase her beta-blocker today. This patients CHA2DS2-VASc Score and unadjusted Ischemic Stroke Rate (% per year)  is equal to 7.2 % stroke rate/year from a score of 5  Above score calculated as 1 point each if present [CHF, HTN, DM, Vascular=MI/PAD/Aortic Plaque, Age if 65-74, or Female] Above score calculated as 2 points each if present [Age > 75, or Stroke/TIA/TE]   Current medicines are reviewed at length with the patient today.   The patient does not have concerns regarding her medicines.  The following changes were made today: Increase metoprolol  Labs/ tests ordered today include:  No orders of the defined types were placed in this encounter.    Disposition:   FU with Marguetta Windish 6 months  Signed, Nidal Rivet Meredith Leeds, MD  01/16/2018 11:44 AM     [facility] [address] [place] [address] [phone] (office) [phone] (fax)

## 2018-01-16 NOTE — Addendum Note (Signed)
Addended by: Stanton Kidney on: 01/16/2018 12:55 PM   Modules accepted: Orders

## 2018-01-17 ENCOUNTER — Other Ambulatory Visit: Payer: Self-pay

## 2018-01-17 ENCOUNTER — Encounter: Payer: Self-pay | Admitting: Family Medicine

## 2018-01-17 ENCOUNTER — Ambulatory Visit (INDEPENDENT_AMBULATORY_CARE_PROVIDER_SITE_OTHER): Payer: Medicare Other | Admitting: Family Medicine

## 2018-01-17 VITALS — BP 120/58 | HR 72 | Temp 97.9°F | Ht 59.0 in | Wt 131.6 lb

## 2018-01-17 DIAGNOSIS — F4321 Adjustment disorder with depressed mood: Secondary | ICD-10-CM

## 2018-01-17 DIAGNOSIS — M810 Age-related osteoporosis without current pathological fracture: Secondary | ICD-10-CM | POA: Diagnosis not present

## 2018-01-17 DIAGNOSIS — L900 Lichen sclerosus et atrophicus: Secondary | ICD-10-CM | POA: Diagnosis not present

## 2018-01-17 LAB — CUP PACEART INCLINIC DEVICE CHECK
Date Time Interrogation Session: 20191023083501
Implantable Lead Implant Date: 20111110
Implantable Lead Implant Date: 20111110
Implantable Lead Location: 753859
Implantable Lead Location: 753860
Implantable Lead Model: 157
Implantable Lead Model: 4135
Implantable Lead Serial Number: 28803817
Implantable Lead Serial Number: 302861
Implantable Pulse Generator Implant Date: 20111110
Pulse Gen Serial Number: 169112

## 2018-01-17 MED ORDER — CLOBETASOL PROPIONATE 0.05 % EX OINT: 1.0000 "application " | TOPICAL_OINTMENT | Freq: Two times a day (BID) | CUTANEOUS | 2 refills | Status: DC

## 2018-01-17 NOTE — Patient Instructions (Signed)
It was a pleasure to see you today! Thank you for choosing [facility] for your primary care. Brenda [name] was seen for vaginal itching.   Our plans for today were:  You have something called lichen sclersosis, which is a skin change that is treated with steroid ointment. Use this as directed. It may take weeks to improve. Come back if this is no help at all.   Try not to scratch it - this will make it worse.   Best,  Dr. Jeanella Cara Sclerosus Lichen sclerosus is a skin problem. It can happen on any part of the body. It happens most often in the anal or genital areas. It can cause itching and discomfort. Treatment can help to control symptoms. This skin problem is not passed from one person to another (not contagious). The cause is not known. Follow these instructions at home:  Take over-the-counter and prescription medicines only as told by your doctor.  Use creams or ointments as told by your doctor.  Do not scratch the affected areas of skin.  Women should keep the vagina as clean and dry as they can.  Keep all follow-up visits as told by your doctor. This is important. Contact a doctor if:  Your redness, swelling, or pain gets worse.  You have fluid, blood, or pus coming from the area.  You have new patches (lesions) on your skin.  You have a fever.  You have pain during sex. This information is not intended to replace advice given to you by your health care provider. Make sure you discuss any questions you have with your health care provider. Document Released: 02/25/2008 Document Revised: 08/20/2015 Document Reviewed: 06/09/2014 Elsevier Interactive Patient Education  [employer].

## 2018-01-17 NOTE — Progress Notes (Signed)
   CC: vaginal itching, osteoporosis  HPI  Grief - going to counseling at hospice.   Vaginal itching - previously had yeast infection and treatment in July. Now recurrent itching x 1 year. Itching "on the inside." has noticed some bleeding with scratching. No discharge. Did have abx from ED earlier this month - reviewed culture - no significant growth. No real change in itching after UTI tx with cipro.   Osteoporosis - she wonders whether she should be on prolia again. She took this in [place] and can't recall why she isn't taking this anymore. She states her "tailbone" is feeling much better from fracture in ED last month.   ROS: Denies CP, SOB, abdominal pain, dysuria, changes in BMs.   CC, SH/smoking status, and VS noted  Objective: BP (!) 120/58   Pulse 72   Temp 97.9 F (36.6 C) (Oral)   Ht 4\' 11"  (1.499 m)   Wt 131 lb 9.6 oz (59.7 kg)   SpO2 99%   BMI 26.58 kg/m  Gen: NAD, alert, cooperative, and pleasant. HEENT: NCAT, EOMI, PERRL CV: RRR, no murmur Resp: CTAB, no wheezes, non-labored GU: hypopigmented smooth skin on labial minora with secondary excoriations and petechiae.  Ext: No edema, warm Neuro: Alert and oriented, Speech clear, No gross deficits  Assessment and plan:  Osteoporosis I will review patient's records and previous meds and call about prolia if able to restart. Certainly needs therapy given recent sacral fracture.  Lichen sclerosus Exam c/w this. Counseled patient that we will try steroids although this can be difficult to treat. Also counseled patient that ddx include vaginal cancer, but this is less likely and we will reassess if not responsive to steroids. Avoid scratching as able.  Grief reaction Undergoing counseling with hospice. Daughter with her today and they argue over family relations with son. She questions need for mood medication. Given that much of the stress comes from family relationships, I am not sure medication will solve this. we  will continue therapy at present and reassess.    No orders of the defined types were placed in this encounter.   Meds ordered this encounter  Medications  . clobetasol ointment (TEMOVATE) 0.05 %    Sig: Apply 1 application topically 2 (two) times daily.    Dispense:  30 g    Refill:  2     Ralene Ok, MD, PGY3 01/19/2018 11:59 AM

## 2018-01-18 ENCOUNTER — Telehealth: Payer: Self-pay

## 2018-01-18 DIAGNOSIS — M810 Age-related osteoporosis without current pathological fracture: Secondary | ICD-10-CM

## 2018-01-18 NOTE — Telephone Encounter (Signed)
Fax from pharmacy requesting 90 day supply of Oxybutynin. Not on current med list.  Danley Danker, RN [facility] RN)

## 2018-01-19 DIAGNOSIS — F4321 Adjustment disorder with depressed mood: Secondary | ICD-10-CM | POA: Insufficient documentation

## 2018-01-19 DIAGNOSIS — F4320 Adjustment disorder, unspecified: Secondary | ICD-10-CM | POA: Insufficient documentation

## 2018-01-19 DIAGNOSIS — L900 Lichen sclerosus et atrophicus: Secondary | ICD-10-CM | POA: Insufficient documentation

## 2018-01-19 MED ORDER — TOLTERODINE TARTRATE ER 2 MG PO CP24: 2.0000 mg | ORAL_CAPSULE | Freq: Every day | ORAL | 1 refills | Status: DC

## 2018-01-19 NOTE — Telephone Encounter (Signed)
Called patient - she is not taking the oxybutynin and is taking Detrol and needs refill. Sent refill of that. Also, I reviewed her chart and she does need to restart Prolia as she asked at our appointment. I'm not sure how we get this administered for her. I will call our pharmacy team and work on this and have someone call her back. Dr. Valentina Lucks, do you know how this is administered and where?

## 2018-01-19 NOTE — Assessment & Plan Note (Signed)
Exam c/w this. Counseled patient that we will try steroids although this can be difficult to treat. Also counseled patient that ddx include vaginal cancer, but this is less likely and we will reassess if not responsive to steroids. Avoid scratching as able.

## 2018-01-19 NOTE — Assessment & Plan Note (Addendum)
I will review patient's records and previous meds and call about prolia if able to restart. Certainly needs therapy given recent sacral fracture.

## 2018-01-19 NOTE — Assessment & Plan Note (Signed)
Undergoing counseling with hospice. Daughter with her today and they argue over family relations with son. She questions need for mood medication. Given that much of the stress comes from family relationships, I am not sure medication will solve this. we will continue therapy at present and reassess.

## 2018-01-22 NOTE — Telephone Encounter (Signed)
We do Boniva injection but not Prolia. I think we do Recalst as well. Tomasa Hosteller and I recently started working on creating Prolia work flow. However, we don't have anything concrete yet. I will discuss with her when she returns.

## 2018-02-01 ENCOUNTER — Ambulatory Visit (INDEPENDENT_AMBULATORY_CARE_PROVIDER_SITE_OTHER): Payer: Medicare Other | Admitting: *Deleted

## 2018-02-01 ENCOUNTER — Telehealth: Payer: Self-pay

## 2018-02-01 DIAGNOSIS — I472 Ventricular tachycardia, unspecified: Secondary | ICD-10-CM

## 2018-02-01 NOTE — Telephone Encounter (Signed)
LMOVM reminding pt to send remote transmission.   

## 2018-02-04 ENCOUNTER — Encounter: Payer: Self-pay | Admitting: Cardiology

## 2018-02-04 NOTE — Progress Notes (Signed)
Remote ICD transmission.   

## 2018-02-13 NOTE — Telephone Encounter (Signed)
I will place a referral to endocrinology to try to get prolia injections since we cannot do this at our office. Will you please let patient know, white team?

## 2018-02-13 NOTE — Addendum Note (Signed)
Addended by: Glenis Smoker on: 02/13/2018 04:02 PM   Modules accepted: Orders

## 2018-02-15 NOTE — Telephone Encounter (Signed)
Spoke to pt daughter and informed of below. Katharina Caper, Parnell Spieler D, [place]

## 2018-02-17 ENCOUNTER — Other Ambulatory Visit: Payer: Self-pay | Admitting: Family Medicine

## 2018-02-19 NOTE — Telephone Encounter (Signed)
Ok to refill under Dr. Curt Bears

## 2018-02-19 NOTE — Telephone Encounter (Signed)
I will forward this refill request to Dr. Macky Lower office. In the past I've tried to redirect the patient to have the pharmacy send refills to cardiology, but her dementia makes this frustrating for her.

## 2018-03-01 ENCOUNTER — Encounter: Payer: Self-pay | Admitting: Internal Medicine

## 2018-03-01 ENCOUNTER — Ambulatory Visit (INDEPENDENT_AMBULATORY_CARE_PROVIDER_SITE_OTHER): Payer: Medicare Other | Admitting: Internal Medicine

## 2018-03-01 VITALS — BP 130/80 | HR 90 | Ht 59.0 in | Wt 131.0 lb

## 2018-03-01 DIAGNOSIS — M810 Age-related osteoporosis without current pathological fracture: Secondary | ICD-10-CM | POA: Diagnosis not present

## 2018-03-01 LAB — VITAMIN D 25 HYDROXY (VIT D DEFICIENCY, FRACTURES): VITD: 83.19 ng/mL (ref 30.00–100.00)

## 2018-03-01 NOTE — Progress Notes (Addendum)
Patient ID: Brenda [name], female   DOB: 1932/03/10, 84 y.o.   MRN: [id number]    HPI  Brenda [name] is a 84 y.o.-year-old female, referred by her PCP, Dr. Lindell Noe, for management of osteoporosis. She moved here from [place] in 02/2016. Dr. Milus Mallick - [place], [place]  Pt was dx with OP in >10 years ago.  No available DXA scan reports to review.  She had several falls in [place] and another fall in 06/2017 >> no fractures. She had back Sx'es >> weakness in back and L leg.  Fractures: -  She had a recent fall in 11/2017: Fracture of both coccyx and sacrum.   She denies dizziness/vertigo but has poor vision.   Previous OP treatments:  - Prolia - 4 inj's - in [place] - last 2016  + h/o vitamin D deficiency. Reviewed available vit D levels: Lab Results  Component Value Date   VD25OH 59.2 04/20/2017   Pt is not on calcium; but she is on vitamin D supplements - 1000 iu daily.  No weight bearing exercises. She has pain in back after her several back surgeries >> she can barely walk now even with the walker.  She does not take high vitamin A doses.  Menopause was at ~84 y/o.   + FH of osteoporosis - mother broke her hip.  She does have a history of hyper/hypocalcemia but no hyperparathyroidism. No h/o kidney stones. Lab Results  Component Value Date   CALCIUM 9.2 12/21/2017   CALCIUM 9.3 12/11/2017   CALCIUM 8.7 (L) 07/20/2017   CALCIUM 8.4 (L) 07/19/2017   CALCIUM 8.7 (L) 07/18/2017   CALCIUM 8.6 (L) 07/17/2017   CALCIUM 10.6 (H) 04/20/2017   CALCIUM 10.8 (H) 04/20/2017   CALCIUM 8.8 (L) 07/30/2016   CALCIUM 9.0 05/25/2016   No h/o thyrotoxicosis. Reviewed TSH recent levels:  Lab Results  Component Value Date   TSH 3.643 07/30/2016   She has mild CKD. Last BUN/Cr: Lab Results  Component Value Date   BUN 19 12/21/2017   CREATININE 1.17 (H) 12/21/2017   Reviewed GFR levels: Lab Results  Component Value Date   GFRNONAA 42 (L) 12/21/2017    GFRNONAA 39 (L) 12/11/2017   GFRNONAA 33 (L) 07/20/2017   GFRNONAA 33 (L) 07/19/2017   GFRNONAA 37 (L) 07/18/2017   GFRNONAA 31 (L) 07/17/2017   GFRNONAA 30 (L) 04/20/2017   GFRNONAA 35 (L) 04/20/2017   GFRNONAA 31 (L) 07/30/2016   GFRNONAA 39 (L) 05/25/2016    ROS: Constitutional: + Weight loss, + decreased appetite, + fatigue, no subjective hyperthermia/hypothermia, + nocturia Eyes: no blurry vision, no xerophthalmia ENT: no sore throat, no nodules palpated in throat, no dysphagia/odynophagia, no hoarseness, + decreased hearing Cardiovascular: no CP/+ SOB/no palpitations/leg swelling Respiratory: no cough/+ SOB Gastrointestinal: no N/V/D/ + C/+ acid reflux Musculoskeletal: no muscle/joint aches Skin: no rashes, + easy bruising Neurological: no tremors/numbness/tingling/dizziness Psychiatric: + depression/+ anxiety - after her husband passed away  Past Medical History:  Diagnosis Date  . Anemia   . Arthritis   . Basal cell carcinoma    nose  . Diabetes mellitus without complication ([place])    pt denies diabetes  . GERD (gastroesophageal reflux disease)   . Hiatal hernia   . HLD (hyperlipidemia)   . Hypertension   . Seizure ([place]) 2011   Past Surgical History:  Procedure Laterality Date  . CARDIAC DEFIBRILLATOR PLACEMENT    . CATARACT EXTRACTION Left 02/2001  . CHOLECYSTECTOMY    . COLONOSCOPY WITH PROPOFOL N/A  07/19/2017   Procedure: COLONOSCOPY WITH PROPOFOL;  Surgeon: Jerene Bears, MD;  Location: [facility];  Service: Gastroenterology;  Laterality: N/A;  . ESOPHAGOGASTRODUODENOSCOPY (EGD) WITH PROPOFOL N/A 07/19/2017   Procedure: ESOPHAGOGASTRODUODENOSCOPY (EGD) WITH PROPOFOL;  Surgeon: Jerene Bears, MD;  Location: [facility] ENDOSCOPY;  Service: Gastroenterology;  Laterality: N/A;  . LUMBAR LAMINECTOMY  06/11/2001  . LUMBAR LAMINECTOMY  02/09/2006  . LUMBAR LAMINECTOMY  07/07/2006   with spianal cord stimulator  . SPINAL CORD STIMULATOR REMOVAL  14/2009  . TUBAL LIGATION      Social History   Socioeconomic History  . Marital status: Married    Spouse name: Not on file  . Number of children: 2  . Years of education: Not on file  . Highest education level: Not on file  Occupational History  . Occupation: retired  Tobacco Use  . Smoking status: Never Smoker  . Smokeless tobacco: Never Used  Substance and Sexual Activity  . Alcohol use: No  . Drug use: No   Current Outpatient Medications on File Prior to Visit  Medication Sig Dispense Refill  . atorvastatin (LIPITOR) 40 MG tablet Take 1 tablet (40 mg total) by mouth daily. 90 tablet 2  . Cholecalciferol (VITAMIN D-3) 1000 units CAPS Take 1,000 Units by mouth daily.    . clobetasol ointment (TEMOVATE) 6.72 % Apply 1 application topically 2 (two) times daily. 30 g 2  . ferrous sulfate 325 (65 FE) MG tablet Take 325 mg by mouth daily with breakfast.    . metoprolol tartrate (LOPRESSOR) 50 MG tablet Take 1 tablet (50 mg total) by mouth 2 (two) times daily. 60 tablet 6  . Multiple Vitamins-Minerals (ICAPS AREDS 2 PO) Take 1 capsule by mouth 2 (two) times daily.     Marland Kitchen omeprazole (PRILOSEC) 40 MG capsule Take 1 capsule (40 mg total) by mouth daily. 90 capsule 1  . ondansetron (ZOFRAN) 4 MG tablet Take 1 tablet (4 mg total) by mouth every 6 (six) hours. 12 tablet 0  . polyethylene glycol (MIRALAX / GLYCOLAX) packet Take 17 g by mouth daily. 14 each 0  . Propylene Glycol (SYSTANE COMPLETE) 0.6 % SOLN Place 1 drop into both eyes daily.    . Rivaroxaban (XARELTO) 15 MG TABS tablet Take 1 tablet (15 mg total) by mouth daily with supper. 90 tablet 2  . tolterodine (DETROL LA) 2 MG 24 hr capsule Take 1 capsule (2 mg total) by mouth daily. 90 capsule 1  . traMADol (ULTRAM) 50 MG tablet Take 0.5-1 tablets (25-50 mg total) by mouth every 6 (six) hours as needed. 15 tablet 0  . verapamil (CALAN-SR) 240 MG CR tablet Take 1 tablet (240 mg total) by mouth at bedtime. 90 tablet 2  . verapamil (CALAN-SR) 240 MG CR tablet TAKE  1 TABLET BY MOUTH EVERY NIGHT AT BEDTIME 90 tablet 0   No current facility-administered medications on file prior to visit.    Allergies  Allergen Reactions  . Sotalol Other (See Comments)    Seizures   . Erythromycin Nausea Only  . Augmentin [Amoxicillin-Pot Clavulanate] Rash  . Crestor [Rosuvastatin Calcium] Rash  . Keflex [Cephalexin] Rash  . Septra [Sulfamethoxazole-Trimethoprim] Rash   Family History  Problem Relation Age of Onset  . Diabetes Mother   . Heart failure Father   . Breast cancer Sister   . Heart failure Sister   . Diabetes Son   . Diabetes Sister   . Colon cancer Neg Hx   . Stomach cancer Neg  Hx   . Rectal cancer Neg Hx   . Esophageal cancer Neg Hx   Also, HTN and heart disease in son  PE: BP 130/80   Pulse 90   Ht 4\' 11"  (1.499 m)   Wt 131 lb (59.4 kg)   SpO2 98%   BMI 26.46 kg/m  Wt Readings from Last 3 Encounters:  03/01/18 131 lb (59.4 kg)  01/17/18 131 lb 9.6 oz (59.7 kg)  01/16/18 130 lb (59 kg)   Constitutional: Normal weight, in NAD.  + Significant kyphosis.  No pain at spine percussion Eyes: PERRLA, EOMI, no exophthalmos ENT: moist mucous membranes, no thyromegaly, no cervical lymphadenopathy Cardiovascular: RRR, No MRG Respiratory: CTA B Gastrointestinal: abdomen soft, NT, ND, BS+ Musculoskeletal: no deformities, strength intact in all 4 Skin: moist, warm, no rashes Neurological: no tremor with outstretched hands, DTR normal in all 4  Assessment: 1. Osteoporosis  Plan: 1. Osteoporosis - likely postmenopausal/age-related, she has FH of OP - I do not have previous bone density report to review, but patient tells me (daughter confirms) that she has had osteoporosis for approximately 10 years or more.  She was on Prolia for 2 years before moving from [place] in 2017.  She believes that the last dose of Prolia was in 2016. - Patient recently had a fall with a sacral and coccygeal fracture almost 3 months ago.  These have healed, but  she continues to have back pain which is chronic, after her back surgeries.  She can barely walk and only with a walker.  We discussed about the importance of doing some weightbearing exercises even if she does some sitting: For example lifting light weights or even using a stationary bike. - we also reviewed her dietary and supplemental calcium and vitamin D intake. I recommended to make sure she gets 1000-1200 mg of calcium daily preferentially from diet, but to supplement with at least 500 mg calcium if she does not feel that she gets that much from the diet.  I will check vit D today to see if she needs further supplementation.  Currently on 1000 units daily. - discussed fall precautions  -she has rails in the bathroom and leaves a night light on in the bathroom at night - given handout from [facility] Re: weight bearing exercises  - we discussed about maintaining a good amount of protein in her diet. The recommended daily protein intake is ~0.8 g per kilogram per day. I advised her to try to aim for this amount, since a diet low in proteins can exacerbate osteoporosis.  She has a decreased appetite after the death of her husband. Also, avoid smoking or >2 drinks of alcohol a day. - We discussed about the different medication classes, benefits and side effects (including atypical fractures and ONJ).  She is planning to have some dental work done: Education administrator, Scientist, product/process development and possible implants.  I recommended a dental office and advised her to schedule an appointment with them to find out exactly what work-up she needs.  If this is involving the jaw, I advised her to finish this before we start medication for osteoporosis. -They are interested in resuming Prolia.  We discussed about this -benefits and possible side effects and also mentioned bisphosphonates and PTH/PTH RP analogs.  I would not suggest bisphosphonates since her kidney function is low.  Also, will hold off using PTH/PTH  RP analogs for now and keep these as a last resort treatment -We discussed that if we restart  Prolia, she cannot miss or delay doses, as her bone density declines significantly after missed dose predisposing her for high risk for fracture - Will check the following labs today: Orders Placed This Encounter  Procedures  . DG Bone Density  . VITAMIN D 25 Hydroxy (Vit-D Deficiency, Fractures)  . BASIC METABOLIC PANEL WITH GFR  - if labs normal, will start a PA for Prolia - will check a new DXA scan now and in 2 years after starting Prolia -  the first indication that the treatment is working is her not having anymore fractures. DXA scan changes are secondary: unchanged or slightly higher T-scores are desirable -Patient also signed a release of information form to obtain her previous DXA scans from [place] - will see pt back in a year  Component     Latest Ref Rng & Units 03/01/2018          Glucose     65 - 99 mg/dL 129 (H)  BUN     7 - 25 mg/dL 24  Creatinine     0.60 - 0.88 mg/dL 1.29 (H)  GFR, Est Non African American     > OR = 60 mL/min/1.72m2 38 (L)  GFR, Est African American     > OR = 60 mL/min/1.15m2 44 (L)  BUN/Creatinine Ratio     6 - 22 (calc) 19  Sodium     135 - 146 mmol/L 135  Potassium     3.5 - 5.3 mmol/L 4.1  Chloride     98 - 110 mmol/L 102  CO2     20 - 32 mmol/L 22  Calcium     8.6 - 10.4 mg/dL 9.2  VITD     30.00 - 100.00 ng/mL 83.19   Creatinine slightly lower, but GFR >30 >> continue Prolia. Vit D excellent.  Will start the PA for Prolia.  Results: DXA ([place], 03/05/2018) Lumbar spine L1-L4 Femoral neck (FN) 33% distal radius  T-score  RFN: -2.9 LFN: -3.0 -4.0  Change in BMD from previous DXA test (%) n/a n/a n/a  (*) statistically significant  Confirmed OP >> will start Prolia.  Addendum: Received records from [facility] in [place]:  DXA scan  09/08/2011: RFN: n/a LFN: -2.4 33% distal L radius: -3.8  08/31/2009:  While she was on vitamin D and calcium RFN: -2.6 LFN: -2.1 UD L radius: -1.5 33% distal L radius: -2.3  12/20/2007: While patient was on Zometa and calcium: RFN: -2.4 LFN: -2.6  12/09/2005: While patient was on calcium and Actonel.  Spine could not be analyzed due to previous instrumentation. RFN: -3.5 LFN: -2.5  Unfortunately, there is no information about Prolia.  Philemon Kingdom, MD PhD [facility] Endocrinology

## 2018-03-01 NOTE — Patient Instructions (Signed)
Please make sure you are getting 1000-1200 mg calcium daily.  Continue 1000 units vitamin D daily.  Start weight bearing exercises.  We will schedule a new DXA scan for you.  Please come back for a follow-up appointment in 1 year.   Marland KitchenHow Can I Prevent Falls? Men and women with osteoporosis need to take care not to fall down. Falls can break bones. Some reasons people fall are: Poor vision  Poor balance  Certain diseases that affect how you walk  Some types of medicine, such as sleeping pills.  Some tips to help prevent falls outdoors are: Use a cane or walker  Wear rubber-soled shoes so you don't slip  Walk on grass when sidewalks are slippery  In winter, put salt or kitty litter on icy sidewalks.  Some ways to help prevent falls indoors are: Keep rooms free of clutter, especially on floors  Use plastic or carpet runners on slippery floors  Wear low-heeled shoes that provide good support  Do not walk in socks, stockings, or slippers  Be sure carpets and area rugs have skid-proof backs or are tacked to the floor  Be sure stairs are well lit and have rails on both sides  Put grab bars on bathroom walls near tub, shower, and toilet  Use a rubber bath mat in the shower or tub  Keep a flashlight next to your bed  Use a sturdy step stool with a handrail and wide steps  Add more lights in rooms (and night lights) Buy a cordless phone to keep with you so that you don't have to rush to the phone       when it rings and so that you can call for help if you fall.   (adapted from http://www.niams.NightlifePreviews.se)  Exercise for Strong Bones (from [place]) There are two types of exercises that are important for building and maintaining bone density:  weight-bearing and muscle-strengthening exercises. Weight-bearing Exercises These exercises include activities that make you move against gravity while staying upright.  Weight-bearing exercises can be high-impact or low-impact. High-impact weight-bearing exercises help build bones and keep them strong. If you have broken a bone due to osteoporosis or are at risk of breaking a bone, you may need to avoid high-impact exercises. If you're not sure, you should check with your healthcare provider. Examples of high-impact weight-bearing exercises are: . Dancing . Doing high-impact aerobics . Hiking . Jogging/running . Jumping Rope . Stair climbing . Tennis Low-impact weight-bearing exercises can also help keep bones strong and are a safe alternative if you cannot do high-impact exercises. Examples of low-impact weight-bearing exercises are: . Using elliptical training machines . Doing low-impact aerobics . Using stair-step machines . Fast walking on a treadmill or outside Muscle-Strengthening Exercises These exercises include activities where you move your body, a weight or some other resistance against gravity. They are also known as resistance exercises and include: . Lifting weights . Using elastic exercise bands . Using weight machines . Lifting your own body weight . Functional movements, such as standing and rising up on your toes Yoga and Pilates can also improve strength, balance and flexibility. However, certain positions may not be safe for people with osteoporosis or those at increased risk of broken bones. For example, exercises that have you bend forward may increase the chance of breaking a bone in the spine. A physical therapist should be able to help you learn which exercises are safe and appropriate for you. Non-Impact Exercises Non-impact exercises can help  you to improve balance, posture and how well you move in everyday activities. These exercises can also help to increase muscle strength and decrease the risk of falls and broken bones. Some of these exercises include: . Balance exercises that strengthen your legs and test your balance, such  as Tai Chi, can decrease your risk of falls. . Posture exercises that improve your posture and reduce rounded or "sloping" shoulders can help you decrease the chance of breaking a bone, especially in the spine. . Functional exercises that improve how well you move can help you with everyday activities and decrease your chance of falling and breaking a bone. For example, if you have trouble getting up from a chair or climbing stairs, you should do these activities as exercises. A physical therapist can teach you balance, posture and functional exercises. Starting a New Exercise Program If you haven't exercised regularly for a while, check with your healthcare provider before beginning a new exercise program-particularly if you have health problems such as heart disease, diabetes or high blood pressure. If you're at high risk of breaking a bone, you should work with a physical therapist to develop a safe exercise program. Once you have your healthcare provider's approval, start slowly. If you've already broken bones in the spine because of osteoporosis, be very careful to avoid activities that require reaching down, bending forward, rapid twisting motions, heavy lifting and those that increase your chance of a fall. As you get started, your muscles may feel sore for a day or two after you exercise. If soreness lasts longer, you may be working too hard and need to ease up. Exercises should be done in a pain-free range of motion. How Much Exercise Do You Need? Weight-bearing exercises 30 minutes on most days of the week. Do a 30-minutesession or multiple sessions spread out throughout the day. The benefits to your bones are the same.   Muscle-strengthening exercises Two to three days per week. If you don't have much time for strengthening/resistance training, do small amounts at a time. You can do just one body part each day. For example do arms one day, legs the next and trunk the next. You can also spread  these exercises out during your normal day.  Balance, posture and functional exercises Every day or as often as needed. You may want to focus on one area more than the others. If you have fallen or lose your balance, spend time doing balance exercises. If you are getting rounded shoulders, work more on posture exercises. If you have trouble climbing stairs or getting up from the couch, do more functional exercises. You can also perform these exercises at one time or spread them during your day. Work with a phyiscal therapist to learn the right exercises for you.

## 2018-03-02 LAB — BASIC METABOLIC PANEL WITH GFR
BUN/Creatinine Ratio: 19 (calc) (ref 6–22)
BUN: 24 mg/dL (ref 7–25)
CO2: 22 mmol/L (ref 20–32)
Calcium: 9.2 mg/dL (ref 8.6–10.4)
Chloride: 102 mmol/L (ref 98–110)
Creat: 1.29 mg/dL — ABNORMAL HIGH (ref 0.60–0.88)
GFR, Est African American: 44 mL/min/{1.73_m2} — ABNORMAL LOW (ref >=60)
GFR, Est Non African American: 38 mL/min/{1.73_m2} — ABNORMAL LOW (ref >=60)
Glucose, Bld: 129 mg/dL — ABNORMAL HIGH (ref 65–99)
Potassium: 4.1 mmol/L (ref 3.5–5.3)
Sodium: 135 mmol/L (ref 135–146)

## 2018-03-05 ENCOUNTER — Ambulatory Visit (INDEPENDENT_AMBULATORY_CARE_PROVIDER_SITE_OTHER)
Admission: RE | Admit: 2018-03-05 | Discharge: 2018-03-05 | Disposition: A | Discharge status: Home / Self Care | Payer: Medicare Other | Source: Ambulatory Visit | Attending: Internal Medicine | Admitting: Internal Medicine

## 2018-03-05 DIAGNOSIS — M810 Age-related osteoporosis without current pathological fracture: Secondary | ICD-10-CM

## 2018-03-06 ENCOUNTER — Telehealth: Payer: Self-pay

## 2018-03-06 NOTE — Telephone Encounter (Signed)
-----   Message from Philemon Kingdom, MD sent at 03/06/2018 12:31 PM EST ----- Lenna Sciara, can you please call pt: Labs are OK, with kidney fxn slightly lower, but we can continue with the plan to restart Prolia. Vit D excellent. I did not receive her records from [place] yet.  Lattie Haw, can we please submit her for Prolia? Thank you! C

## 2018-03-08 NOTE — Telephone Encounter (Signed)
This patient has been added to the portal

## 2018-03-08 NOTE — Telephone Encounter (Signed)
Notified patient of message from Dr. Cruzita Lederer, patient expressed understanding and agreement. No further questions.  Routing to [place].

## 2018-03-09 DIAGNOSIS — Z85828 Personal history of other malignant neoplasm of skin: Secondary | ICD-10-CM | POA: Diagnosis not present

## 2018-03-09 DIAGNOSIS — L905 Scar conditions and fibrosis of skin: Secondary | ICD-10-CM | POA: Diagnosis not present

## 2018-03-09 DIAGNOSIS — D485 Neoplasm of uncertain behavior of skin: Secondary | ICD-10-CM | POA: Diagnosis not present

## 2018-03-09 DIAGNOSIS — C4442 Squamous cell carcinoma of skin of scalp and neck: Secondary | ICD-10-CM | POA: Diagnosis not present

## 2018-03-13 ENCOUNTER — Telehealth: Payer: Self-pay

## 2018-03-13 NOTE — Telephone Encounter (Signed)
-----   Message from Philemon Kingdom, MD sent at 03/12/2018  8:25 AM EST ----- Lenna Sciara, can you please call pt: The new bone density shows confirmed osteoporosis.  We already sent paperwork to her insurance and we are waiting for approval to start Prolia.  We will let her know as soon as this is available.

## 2018-03-13 NOTE — Telephone Encounter (Signed)
Notified patient of message from Dr. Gherghe, patient expressed understanding and agreement. No further questions.  

## 2018-03-15 ENCOUNTER — Encounter: Payer: Self-pay | Admitting: Family Medicine

## 2018-03-15 ENCOUNTER — Other Ambulatory Visit: Payer: Self-pay

## 2018-03-15 ENCOUNTER — Ambulatory Visit (INDEPENDENT_AMBULATORY_CARE_PROVIDER_SITE_OTHER): Payer: Medicare Other | Admitting: Family Medicine

## 2018-03-15 VITALS — BP 136/60 | HR 68 | Temp 97.9°F | Ht 59.0 in | Wt 135.6 lb

## 2018-03-15 DIAGNOSIS — R4189 Other symptoms and signs involving cognitive functions and awareness: Secondary | ICD-10-CM

## 2018-03-15 NOTE — Progress Notes (Signed)
CC:   HPI  Vaginal itching - much improved, not completely gone. Applies the ointment PRN itching. She is pleased with this progress.   Grief - she again reminds me today that her husband passed away.  We have discussed this at each visit.  She continues to be sad, but continues hospice and palliative care grief counseling.  She states Thanksgiving and Christmas are particularly hard.  She is getting together with family.  Medication management: She states she is almost out of several of her medications, and did not know that there are refills waiting for her [facility].  She would prefer that her medications are dispensed via mail order pharmacy, which she was previously doing.  She says she is just taking medications from each bottle, not using a pillbox as we have previously loaded and recommended for her.  Her daughter "has other things going on, "and is thus no longer helping her with her medications.  Her son brings her today.  She states that she has a hard time getting to the pharmacy to pick up her medicines.  She would be amenable to someone from case management or home health helping with medication management.  She acknowledges that she frequently becomes confused, including about when to take her medications.  She was able to get to the endocrinology office, although she does not recall their phone message from 2 days ago letting her know that they are working on the prior Auth.  ROS: Denies CP, SOB, abdominal pain, dysuria, changes in BMs.   CC, SH/smoking status, and VS noted  Objective: BP 136/60   Pulse 68   Temp 97.9 F (36.6 C) (Oral)   Ht 4\' 11"  (1.499 m)   Wt 135 lb 9.6 oz (61.5 kg)   SpO2 94%   BMI 27.39 kg/m  Gen: NAD, alert, cooperative, and pleasant. HEENT: NCAT, EOMI, PERRL CV: RRR, no murmur Resp: CTAB, no wheezes, non-labored Neuro: Alert and oriented, Speech clear, No gross deficits  Assessment and plan:  Vaginal itching: Improved, consider further  work-up if recurrent.  Grief: Continue counseling.  Med management: Attempted [facility] referral, however patient is not eligible.  Also considered patient coming to see Dr. Everitt Amber to load a pillbox, however we tried this in the past and that was not adhered to at home.  Patient and her son were instructed to go to [facility] after her visit and pick up the several refills which should be waiting for them there.  Additionally, I placed a home health referral for both nursing and aide to go into the home and assess for locations and duplicates of medications, as well as loaded her pillbox for her.  Her cardiologist prescribes about half of her medicines, and we could consider coordinating with them to fill a bubble pack if her insurance would cover it.  Osteoporosis: Endocrinology is helping US obtain Prolia, prior Auth has begun.  Orders Placed This Encounter  Procedures  . AMB Referral to [place] Management    Referral Priority:   Routine    Referral Type:   Consultation    Referral Reason:   [facility]    Number of Visits Requested:   1  . Ambulatory referral to Home Health    Referral Priority:   Routine    Referral Type:   Home Health Care    Referral Reason:   Specialty Services Required    Requested Specialty:   [place]    Number of Visits Requested:  1    No orders of the defined types were placed in this encounter.   Ralene Ok, MD, PGY3 03/16/2018 8:52 AM

## 2018-03-15 NOTE — Patient Instructions (Signed)
It was a pleasure to see you today! Thank you for choosing [facility] for your primary care. Brenda [name] was seen for medical management.   Our plans for today were:  [facility] pharmacy team will call you.   [facility] and [facility]ills.   Best,  Dr. Lindell Noe

## 2018-03-18 ENCOUNTER — Other Ambulatory Visit: Payer: Self-pay | Admitting: Family Medicine

## 2018-04-05 ENCOUNTER — Telehealth: Payer: Self-pay

## 2018-04-05 NOTE — Telephone Encounter (Signed)
Called and gave verbal order for med review, RN to review plan of care. Gave clinic call back if needed.

## 2018-04-05 NOTE — Telephone Encounter (Signed)
Curt Bears, RN with [facility], calling for start of care orders:  Call back is [phone] - ok to leave message, this is a confidential voicemail.  Danley Danker, RN [facility] RN)

## 2018-04-07 LAB — CUP PACEART REMOTE DEVICE CHECK
Battery Remaining Longevity: 36 mo
Battery Remaining Percentage: 45 %
Brady Statistic RA Percent Paced: 56 %
Brady Statistic RV Percent Paced: 0 %
Date Time Interrogation Session: 20191107204200
HighPow Impedance: 50 Ohm
Implantable Lead Implant Date: 20111110
Implantable Lead Implant Date: 20111110
Implantable Lead Location: 753859
Implantable Lead Location: 753860
Implantable Lead Model: 157
Implantable Lead Model: 4135
Implantable Lead Serial Number: 28803817
Implantable Lead Serial Number: 302861
Implantable Pulse Generator Implant Date: 20111110
Lead Channel Impedance Value: 447 Ohm
Lead Channel Impedance Value: 719 Ohm
Lead Channel Pacing Threshold Amplitude: 0.3 V
Lead Channel Pacing Threshold Amplitude: 0.8 V
Lead Channel Pacing Threshold Pulse Width: 0.4 ms
Lead Channel Pacing Threshold Pulse Width: 0.4 ms
Lead Channel Setting Pacing Amplitude: 2 V
Lead Channel Setting Pacing Amplitude: 2.4 V
Lead Channel Setting Pacing Pulse Width: 0.4 ms
Lead Channel Setting Sensing Sensitivity: 0.6 mV
Pulse Gen Serial Number: 169112

## 2018-04-09 ENCOUNTER — Telehealth: Payer: Self-pay | Admitting: Family Medicine

## 2018-04-09 DIAGNOSIS — C4442 Squamous cell carcinoma of skin of scalp and neck: Secondary | ICD-10-CM | POA: Diagnosis not present

## 2018-04-09 DIAGNOSIS — L570 Actinic keratosis: Secondary | ICD-10-CM | POA: Diagnosis not present

## 2018-04-09 NOTE — Telephone Encounter (Signed)
[facility] team, can you clarify what info the PT needs? I'm on nights.

## 2018-04-09 NOTE — Telephone Encounter (Signed)
Dan Europe the Physical Therapy for Brenda [name] called and she needs information regarding to Physical Therapy orders. Please call her back at [phone]. ad

## 2018-04-11 NOTE — Telephone Encounter (Signed)
LVM for Dan Europe from [facility] to call back to clarify what information she is needing for PT orders. Katharina Caper, Aeneas Longsworth D, [place]

## 2018-04-16 NOTE — Telephone Encounter (Signed)
CAlled [name] with [employer].  She is requesting PT  2x for 2wks, 1x for 1wk, for generalized weakness, give fall prevention education, exercises, work on function ability. Please advise. Brenda [name], Brenda [name], [place]

## 2018-04-17 NOTE — Telephone Encounter (Signed)
Informed Dan Europe of below. Katharina Caper, Amitai Delaughter D, [place]

## 2018-04-17 NOTE — Telephone Encounter (Signed)
Please give verbal ok for this per me. I am at an outside office today.

## 2018-04-19 ENCOUNTER — Other Ambulatory Visit: Payer: Self-pay

## 2018-04-19 ENCOUNTER — Other Ambulatory Visit: Payer: Self-pay | Admitting: *Deleted

## 2018-04-19 ENCOUNTER — Other Ambulatory Visit: Payer: Self-pay | Admitting: Physician Assistant

## 2018-04-19 MED ORDER — TOLTERODINE TARTRATE ER 2 MG PO CP24: ORAL_CAPSULE | ORAL | 1 refills | Status: DC

## 2018-04-19 MED ORDER — OMEPRAZOLE 40 MG PO CPDR: 40.0000 mg | DELAYED_RELEASE_CAPSULE | Freq: Two times a day (BID) | ORAL | 3 refills | Status: DC

## 2018-04-20 ENCOUNTER — Other Ambulatory Visit: Payer: Self-pay | Admitting: *Deleted

## 2018-04-20 DIAGNOSIS — I480 Paroxysmal atrial fibrillation: Secondary | ICD-10-CM

## 2018-04-20 MED ORDER — RIVAROXABAN 15 MG PO TABS: 15.0000 mg | ORAL_TABLET | Freq: Every day | ORAL | 2 refills | Status: DC

## 2018-04-23 ENCOUNTER — Other Ambulatory Visit: Payer: Self-pay | Admitting: Cardiology

## 2018-04-23 MED ORDER — VERAPAMIL HCL ER 240 MG PO TBCR: 240.0000 mg | EXTENDED_RELEASE_TABLET | Freq: Every day | ORAL | 2 refills | Status: DC

## 2018-04-23 MED ORDER — METOPROLOL TARTRATE 50 MG PO TABS: 50.0000 mg | ORAL_TABLET | Freq: Two times a day (BID) | ORAL | 2 refills | Status: DC

## 2018-04-23 MED ORDER — ATORVASTATIN CALCIUM 40 MG PO TABS: 40.0000 mg | ORAL_TABLET | Freq: Every day | ORAL | 2 refills | Status: DC

## 2018-05-03 ENCOUNTER — Ambulatory Visit (INDEPENDENT_AMBULATORY_CARE_PROVIDER_SITE_OTHER): Payer: Medicare Other

## 2018-05-03 DIAGNOSIS — I472 Ventricular tachycardia, unspecified: Secondary | ICD-10-CM

## 2018-05-06 LAB — CUP PACEART REMOTE DEVICE CHECK
Battery Remaining Longevity: 36 mo
Battery Remaining Percentage: 43 %
Brady Statistic RA Percent Paced: 70 %
Brady Statistic RV Percent Paced: 0 %
Date Time Interrogation Session: 20200207035400
HighPow Impedance: 50 Ohm
Implantable Lead Implant Date: 20111110
Implantable Lead Implant Date: 20111110
Implantable Lead Location: 753859
Implantable Lead Location: 753860
Implantable Lead Model: 157
Implantable Lead Model: 4135
Implantable Lead Serial Number: 28803817
Implantable Lead Serial Number: 302861
Implantable Pulse Generator Implant Date: 20111110
Lead Channel Impedance Value: 439 Ohm
Lead Channel Impedance Value: 738 Ohm
Lead Channel Pacing Threshold Amplitude: 0.3 V
Lead Channel Pacing Threshold Amplitude: 0.8 V
Lead Channel Pacing Threshold Pulse Width: 0.4 ms
Lead Channel Pacing Threshold Pulse Width: 0.4 ms
Lead Channel Setting Pacing Amplitude: 2 V
Lead Channel Setting Pacing Amplitude: 2.4 V
Lead Channel Setting Pacing Pulse Width: 0.4 ms
Lead Channel Setting Sensing Sensitivity: 0.6 mV
Pulse Gen Serial Number: 169112

## 2018-05-14 NOTE — Progress Notes (Signed)
Remote ICD transmission.   

## 2018-07-05 ENCOUNTER — Telehealth: Payer: Self-pay | Admitting: *Deleted

## 2018-07-05 NOTE — Telephone Encounter (Signed)
Called patient to let them know due to recent [place] and Health Department Protocols, we are not seeing patients in the office. We are instead seeing if they would like to schedule this appointment as a Nurse, children's or Laptop. Unable to reach patient.  Phone continuously rings. No VM.

## 2018-07-10 NOTE — Telephone Encounter (Signed)
Called patient to let them know due to recent [place] and Health Department Protocols, we are not seeing patients in the office. We are instead seeing if they would like to schedule this appointment as a Nurse, children's or Laptop. Patient is aware if they decide to reschedule this appointment, they may not be seen or scheduled for the next 4-6 months. Patient at this time declines Virtual Visits. Patient does not have computer, internet, or smart phone access. Message sent scheduling and nurse.   Concerns and/or Complaints:                    Since your last visit or hospitalization:   1. Have you been having new or worsening chest pain?  Recent sharp pain that lasted only a few seconds. 2. Have you been having new or worsening shortness of breath? NO 3. Have you been having new or worsening leg swelling, wt gain, or increase in abdominal girth (pants fitting more tightly)? Has had some ankle swelling but goes down with rest.  4. Have you had any passing out spells? NO 5. Have you had any extreme tiredness or fatigue?    Was told by Insurance she was approved to start checking device every month. Had an issue with home transmitter on March 8th, called the after hours number and they told her her to unplug it and leave it alone. No one ever called her back to see what was wrong with it. A light on it stays blue but it has not done this in the past. She is not sure who she talked to and he would not give her his name. He said he was the only one there that night and did not give her a name. Never had a problem like this while [place]. This is the first time she has had an issue and no one has called her back she says and would like a call back to see what she should do with her device.  Will send message to Device Pool and Sherri for further advisement.

## 2018-07-11 NOTE — Telephone Encounter (Signed)
I spoke with the pt and she do not understand why her monitor blue light stay on constantly. I gave her the number to [facility] tech support to get further assistance. I also explained to her that the letter we send out is to let her know when the next transmission date. I explained to her that the letter states IF you are followed by Margarita Grizzle than continue to send monthly. I told her I do not see any indication that she is being followed by Sharman Cheek and she do not have to send monthly. Pt states she understood.

## 2018-07-13 NOTE — Telephone Encounter (Signed)
Virtual Visit Pre-Appointment Phone Call  Steps For Call:  1. Confirm consent - "In the setting of the current Covid19 crisis, you are scheduled for a (phone or video) visit with your provider on (date) at (time).  Just as we do with many in-office visits, in order for you to participate in this visit, we must obtain consent.  If you'd like, I can send this to your mychart (if signed up) or email for you to review.  Otherwise, I can obtain your verbal consent now.  All virtual visits are billed to your insurance company just like a normal visit would be.  By agreeing to a virtual visit, we'd like you to understand that the technology does not allow for your provider to perform an examination, and thus may limit your provider's ability to fully assess your condition. If your provider identifies any concerns that need to be evaluated in person, we will make arrangements to do so.  Finally, though the technology is pretty good, we cannot assure that it will always work on either your or our end, and in the setting of a video visit, we may have to convert it to a phone-only visit.  In either situation, we cannot ensure that we have a secure connection.  Are you willing to proceed?" STAFF: Did the patient verbally acknowledge consent to telehealth visit? Document YES/NO here: YES  2. Confirm the BEST phone number to call the day of the visit by including in appointment notes  3. Give patient instructions for WebEx/MyChart download to smartphone as below or Doximity/Doxy.me if video visit (depending on what platform provider is using)  4. Advise patient to be prepared with their blood pressure, heart rate, weight, any heart rhythm information, their current medicines, and a piece of paper and pen handy for any instructions they may receive the day of their visit  5. Inform patient they will receive a phone call 15 minutes prior to their appointment time (may be from unknown caller ID) so they should be  prepared to answer  6. Confirm that appointment type is correct in Epic appointment notes (VIDEO vs PHONE)     TELEPHONE CALL NOTE  Brenda [name] has been deemed a candidate for a follow-up tele-health visit to limit community exposure during the Covid-19 pandemic. I spoke with the patient via phone to ensure availability of phone/video source, confirm preferred email & phone number, and discuss instructions and expectations.  I reminded Brenda [name] to be prepared with any vital sign and/or heart rhythm information that could potentially be obtained via home monitoring, at the time of her visit. I reminded Brenda [name] to expect a phone call at the time of her visit if her visit.  Brenda Kidney, RN 07/13/2018 11:55 AM   INSTRUCTIONS FOR DOWNLOADING THE WEBEX APP TO SMARTPHONE  - If Apple, ask patient to go to [employer] and type in WebEx in the search bar. [place] [employer], the blue/green circle. If Android, go to [employer] and type in [employer] in the search bar. The app is free but as with any other app downloads, their phone may require them to verify saved payment information or Apple/Android password.  - The patient does NOT have to create an account. - On the day of the visit, the assist will walk the patient through joining the meeting with the meeting number/password.  INSTRUCTIONS FOR DOWNLOADING THE MYCHART APP TO SMARTPHONE  - The patient must first make sure to  have activated MyChart and know their login information - If Apple, go to [employer] and type in MyChart in the search bar and download the app. If Android, ask patient to go to [employer] and type in [place] in the search bar and download the app. The app is free but as with any other app downloads, their phone may require them to verify saved payment information or Apple/Android password.  - The patient will need to then log into the app with their MyChart username and password,  and select [facility] as their healthcare provider to link the account. When it is time for your visit, go to the MyChart app, find appointments, and click Begin Video Visit. Be sure to Select Allow for your device to access the Microphone and Camera for your visit. You will then be connected, and your provider will be with you shortly.  **If they have any issues connecting, or need assistance please contact MyChart service desk [phone])**  **If using a computer, in order to ensure the best quality for their visit they will need to use either of the following Internet Browsers: [employer], or Google Chrome**  IF USING DOXIMITY or DOXY.ME - The patient will receive a link just prior to their visit, either by text or email (to be determined day of appointment depending on if it's doxy.me or Doximity).     FULL LENGTH CONSENT FOR TELE-HEALTH VISIT   I hereby voluntarily request, consent and authorize [facility] and its employed or contracted physicians, physician assistants, nurse practitioners or other licensed health care professionals (the Practitioner), to provide me with telemedicine health care services (the "Services") as deemed necessary by the treating Practitioner. I acknowledge and consent to receive the Services by the Practitioner via telemedicine. I understand that the telemedicine visit will involve communicating with the Practitioner through live audiovisual communication technology and the disclosure of certain medical information by electronic transmission. I acknowledge that I have been given the opportunity to request an in-person assessment or other available alternative prior to the telemedicine visit and am voluntarily participating in the telemedicine visit.  I understand that I have the right to withhold or withdraw my consent to the use of telemedicine in the course of my care at any time, without affecting my right to future care or treatment, and  that the Practitioner or I may terminate the telemedicine visit at any time. I understand that I have the right to inspect all information obtained and/or recorded in the course of the telemedicine visit and may receive copies of available information for a reasonable fee.  I understand that some of the potential risks of receiving the Services via telemedicine include:  Marland Kitchen Delay or interruption in medical evaluation due to technological equipment failure or disruption; . Information transmitted may not be sufficient (e.g. poor resolution of images) to allow for appropriate medical decision making by the Practitioner; and/or  . In rare instances, security protocols could fail, causing a breach of personal health information.  Furthermore, I acknowledge that it is my responsibility to provide information about my medical history, conditions and care that is complete and accurate to the best of my ability. I acknowledge that Practitioner's advice, recommendations, and/or decision may be based on factors not within their control, such as incomplete or inaccurate data provided by me or distortions of diagnostic images or specimens that may result from electronic transmissions. I understand that the practice of medicine is not an exact science and  that Practitioner makes no warranties or guarantees regarding treatment outcomes. I acknowledge that I will receive a copy of this consent concurrently upon execution via email to the email address I last provided but may also request a printed copy by calling the office of [facility].    I understand that my insurance will be billed for this visit.   I have read or had this consent read to me. . I understand the contents of this consent, which adequately explains the benefits and risks of the Services being provided via telemedicine.  . I have been provided ample opportunity to ask questions regarding this consent and the Services and have had my questions  answered to my satisfaction. . I give my informed consent for the services to be provided through the use of telemedicine in my medical care  By participating in this telemedicine visit I agree to the above.

## 2018-07-16 ENCOUNTER — Telehealth: Payer: Medicare Other | Admitting: Family Medicine

## 2018-07-16 ENCOUNTER — Telehealth (INDEPENDENT_AMBULATORY_CARE_PROVIDER_SITE_OTHER): Payer: Medicare Other | Admitting: Cardiology

## 2018-07-16 ENCOUNTER — Encounter: Payer: Self-pay | Admitting: Cardiology

## 2018-07-16 ENCOUNTER — Other Ambulatory Visit: Payer: Self-pay

## 2018-07-16 DIAGNOSIS — I480 Paroxysmal atrial fibrillation: Secondary | ICD-10-CM | POA: Diagnosis not present

## 2018-07-16 DIAGNOSIS — K921 Melena: Secondary | ICD-10-CM

## 2018-07-16 DIAGNOSIS — K5900 Constipation, unspecified: Secondary | ICD-10-CM

## 2018-07-16 NOTE — Assessment & Plan Note (Signed)
Continue Miralax and Metamucil prn. Increase fiber diet.

## 2018-07-16 NOTE — Progress Notes (Signed)
Electrophysiology TeleHealth Note   Due to national recommendations of social distancing due to COVID 19, an audio/video telehealth visit is felt to be most appropriate for this patient at this time.  See Epic message for the patient's consent to telehealth for [facility].   Date:  07/16/2018   ID:  Brenda [name], DOB 05/05/1931, MRN [id number]  Location: patient's home  Provider location: [address] [place]  Evaluation Performed: Follow-up visit  PCP:  Sela Hilding, MD  Cardiologist:  Vontrell Pullman Meredith Leeds, MD  Electrophysiologist:  Dr Curt Bears  Chief Complaint:  VT  History of Present Illness:    Brenda [name] is a 85 y.o. female who presents via audio/video conferencing for a telehealth visit today.  Since last being seen in our clinic, the patient reports doing very well.  Today, she denies symptoms of palpitations, chest pain, shortness of breath,  lower extremity edema, dizziness, presyncope, or syncope.  The patient is otherwise without complaint today.  The patient denies symptoms of fevers, chills, cough, or new SOB worrisome for COVID 19.  Brenda [name] is a 85 y.o. female who is being seen today for the evaluation of VT at the request of Sela Hilding, MD. She has a history of CVA, diabetes, and hypertension.  She does have a defibrillator in place with a history showing that it is due to ventricular tachycardia.  Echo November 2017 showed a preserved ejection fraction.  She is currently on sotalol.  She has had rapid atrial fibrillation and has had shocks by her ICD.  Today, denies symptoms of palpitations, chest pain, shortness of breath, orthopnea, PND, lower extremity edema, claudication, dizziness, presyncope, syncope, bleeding, or neurologic sequela. The patient is tolerating medications without difficulties.  She is unaware of any arrhythmias.  Her main complaints today are of constipation, diarrhea, nausea, vomiting, and  decreased appetite.  She also says that she worries about just about everything in her life constantly.  She worries that she does not have enough money, and that her GI complaints Alonah Lineback be much more serious.  I have encouraged her to call her primary physician to discuss her multitude of GI complaints.  She may need referral to a gastroenterologist.  Past Medical History:  Diagnosis Date  . Anemia   . Arthritis   . Basal cell carcinoma    nose  . Diabetes mellitus without complication ([place])    pt denies diabetes  . GERD (gastroesophageal reflux disease)   . Hiatal hernia   . HLD (hyperlipidemia)   . Hypertension   . Seizure ([place]) 2011    Past Surgical History:  Procedure Laterality Date  . CARDIAC DEFIBRILLATOR PLACEMENT    . CATARACT EXTRACTION Left 02/2001  . CHOLECYSTECTOMY    . COLONOSCOPY WITH PROPOFOL N/A 07/19/2017   Procedure: COLONOSCOPY WITH PROPOFOL;  Surgeon: Jerene Bears, MD;  Location: [facility];  Service: Gastroenterology;  Laterality: N/A;  . ESOPHAGOGASTRODUODENOSCOPY (EGD) WITH PROPOFOL N/A 07/19/2017   Procedure: ESOPHAGOGASTRODUODENOSCOPY (EGD) WITH PROPOFOL;  Surgeon: Jerene Bears, MD;  [facility];  Service: Gastroenterology;  Laterality: N/A;  . LUMBAR LAMINECTOMY  06/11/2001  . LUMBAR LAMINECTOMY  02/09/2006  . LUMBAR LAMINECTOMY  07/07/2006   with spianal cord stimulator  . SPINAL CORD STIMULATOR REMOVAL  14/2009  . TUBAL LIGATION      Current Outpatient Medications  Medication Sig Dispense Refill  . atorvastatin (LIPITOR) 40 MG tablet Take 1 tablet (40 mg total) by mouth daily. 90 tablet  2  . Cholecalciferol (VITAMIN D-3) 1000 units CAPS Take 1,000 Units by mouth daily.    . clobetasol ointment (TEMOVATE) 4.31 % Apply 1 application topically 2 (two) times daily. 30 g 2  . ferrous sulfate 325 (65 FE) MG tablet Take 325 mg by mouth daily with breakfast.    . metoprolol tartrate (LOPRESSOR) 50 MG tablet Take 1 tablet (50 mg total) by mouth  2 (two) times daily. 180 tablet 2  . Multiple Vitamins-Minerals (ICAPS AREDS 2 PO) Take 1 capsule by mouth 2 (two) times daily.     Marland Kitchen omeprazole (PRILOSEC) 40 MG capsule Take 1 capsule (40 mg total) by mouth 2 (two) times daily. Take before breakfast and dinner. 180 capsule 3  . Propylene Glycol (SYSTANE COMPLETE) 0.6 % SOLN Place 1 drop into both eyes daily.    . Rivaroxaban (XARELTO) 15 MG TABS tablet Take 1 tablet (15 mg total) by mouth daily with supper. 90 tablet 2  . tolterodine (DETROL LA) 2 MG 24 hr capsule TAKE 1 CAPSULE(2 MG) BY MOUTH DAILY 90 capsule 1  . verapamil (CALAN-SR) 240 MG CR tablet Take 1 tablet (240 mg total) by mouth at bedtime. 90 tablet 2   No current facility-administered medications for this visit.     Allergies:   Sotalol; Erythromycin; Augmentin [amoxicillin-pot clavulanate]; Crestor [rosuvastatin calcium]; Keflex [cephalexin]; and Septra [sulfamethoxazole-trimethoprim]   Social History:  The patient  reports that she has never smoked. She has never used smokeless tobacco. She reports that she does not drink alcohol or use drugs.   Family History:  The patient's  family history includes Breast cancer in her sister; Diabetes in her mother, sister, and son; Heart failure in her father and sister.   ROS:  Please see the history of present illness.   All other systems are personally reviewed and negative.    Exam:    Vital Signs:  There were no vitals taken for this visit.  Well appearing, alert and conversant, regular work of breathing,  good skin color Eyes- anicteric, neuro- grossly intact, skin- no apparent rash or lesions or cyanosis, mouth- oral mucosa is pink   Labs/Other Tests and Data Reviewed:    Recent Labs: 12/21/2017: Hemoglobin 11.6; Platelets 243 03/01/2018: BUN 24; Creat 1.29; Potassium 4.1; Sodium 135   Wt Readings from Last 3 Encounters:  03/15/18 135 lb 9.6 oz (61.5 kg)  03/01/18 131 lb (59.4 kg)  01/17/18 131 lb 9.6 oz (59.7 kg)      Other studies personally reviewed: Additional studies/ records that were reviewed today include: ECG 01/16/2018 personally reviewed Review of the above records today demonstrates: Sinus tachycardia rate 109  Last device remote is reviewed from [facility] PDF dated 05/04/2018 which reveals normal device function, no arrhythmias    ASSESSMENT & PLAN:    1.  Hypertension: Blood pressure is well controlled.  No changes  2.  Inducible ventricular tachycardia: Status post [place].  Functioning appropriately.  No changes.  3.  Paroxysmal atrial fibrillation: Currently on Xarelto.  Rate control with metoprolol.  No obvious atrial fibrillation at this time.  No changes.  This patients CHA2DS2-VASc Score and unadjusted Ischemic Stroke Rate (% per year) is equal to 7.2 % stroke rate/year from a score of 5  Above score calculated as 1 point each if present [CHF, HTN, DM, Vascular=MI/PAD/Aortic Plaque, Age if 65-74, or Female] Above score calculated as 2 points each if present [Age > 75, or Stroke/TIA/TE]      COVID  19 screen The patient denies symptoms of COVID 19 at this time.  The importance of social distancing was discussed today.  Follow-up: 6 months Next remote: 08/09/2018  Current medicines are reviewed at length with the patient today.   The patient does not have concerns regarding her medicines.  The following changes were made today:  none  Labs/ tests ordered today include:  No orders of the defined types were placed in this encounter.    Patient Risk:  after full review of this patients clinical status, I feel that they are at moderate risk at this time.  Today, I have spent 20 minutes with the patient with telehealth technology discussing atrial fibrillation, ICD management, GI complaints.    Signed, Winnona Wargo Meredith Leeds, MD  07/16/2018 12:21 PM     [place] [address] [phone] (office) [phone]  (fax)

## 2018-07-16 NOTE — Assessment & Plan Note (Addendum)
Stool color ?? Due to ferrous sulphate vs concern for GI bleed due to previous hx and she is also on a blood thinner. I recommended office visit for CBC check and possible FOBT. She will benefit from GI referral given worsening of her GI symptoms. May need to hold blood thinner if GI bleed is confirmed. I advised her to contact her GI's office ASAP to schedule follow-up. Appointment offered for her to come to our office this Wednesday. However, her daughter is not able to bring her in till a day later. Appointment made. Red flag symptoms and indication for ED visit discussed. She and her daughter verbalized understanding and agreed with the plan.

## 2018-07-16 NOTE — Progress Notes (Addendum)
[place] Telemedicine Visit  Patient consented to have virtual visit. Method of visit:  Telephone  Encounter participants: Patient: Brenda [name] - located at home Provider: Andrena Mews - located at offsite Others (if applicable): Daughter [name]  Chief Complaint: Constipation/Dark stool  HPI:  Constipation  This is a chronic problem. Episode onset: STarted 5 years ago now worsening. The problem has been gradually worsening since onset. Her stool frequency is 2 to 3 times per week. The stool is described as formed (hard and dark black). The patient is not on a high fiber diet. She does not exercise regularly. There has not been adequate water intake. Associated symptoms include anorexia and nausea. Pertinent negatives include no abdominal pain, diarrhea, flatus, hematochezia or vomiting. Associated symptoms comments: Stool is black in color. She feels tired and dizzy.. Treatments tried: Metamucil and Miralax. The treatment provided moderate relief.  Gastroesophageal Reflux  She complains of belching and nausea. She reports no abdominal pain.     ROS: per HPI  Pertinent PMHx: Problem list reviewed  Exam:  Respiratory: No Resp Distress  Assessment/Plan:  Constipation Continue Miralax and Metamucil prn. Increase fiber diet.  [place] Stool color ?? Due to ferrous sulphate vs concern for GI bleed due to previous hx and she is also on a blood thinner. I recommended office visit for CBC check and possible FOBT. She will benefit from GI referral given worsening of her GI symptoms. May need to hold blood thinner if GI bleed is confirmed. I advised her to contact her GI's office ASAP to schedule follow-up. Appointment offered for her to come to our office this Wednesday. However, her daughter is not able to bring her in till a day later. Appointment made. Red flag symptoms and indication for ED visit discussed. She and her daughter verbalized  understanding and agreed with the plan.    Time spent during visit with patient: 26 minutes  No charge since patient will be seen in 3 days.

## 2018-07-19 ENCOUNTER — Ambulatory Visit (INDEPENDENT_AMBULATORY_CARE_PROVIDER_SITE_OTHER): Payer: Medicare Other | Admitting: Family Medicine

## 2018-07-19 ENCOUNTER — Other Ambulatory Visit: Payer: Self-pay

## 2018-07-19 VITALS — BP 130/60 | HR 85 | Temp 97.9°F | Ht 59.0 in | Wt 133.0 lb

## 2018-07-19 DIAGNOSIS — K921 Melena: Secondary | ICD-10-CM | POA: Diagnosis not present

## 2018-07-19 DIAGNOSIS — R58 Hemorrhage, not elsewhere classified: Secondary | ICD-10-CM

## 2018-07-19 LAB — POCT HEMOGLOBIN: Hemoglobin: 11.2 g/dL (ref 11–14.6)

## 2018-07-19 LAB — HEMOCCULT GUIAC POC 1CARD (OFFICE): Fecal Occult Blood, POC: NEGATIVE

## 2018-07-19 NOTE — Patient Instructions (Signed)
Based on your stable blood level and the test showing no blood in your stool, it is unlikely that you are experiencing another episode of gastrointestinal bleeding.  We do not need to start any new medications today.  It may be helpful to space out your iron supplements to every other day instead of every day.    If you think that your symptoms of GERD (reflux or heartburn) are getting worse.  It may be helpful to visit with you Gastrointestinal doctor.

## 2018-07-19 NOTE — Progress Notes (Signed)
    Subjective:  Brenda [name] is a 85 y.o. female who presents to the [facility] today with a chief complaint of black stool.   HPI: Concern for GI bleed Ms. Rua has a history of previous GI bleed and is currently coagulation.  She reports that she has been having dark stools for about 1 month. She reports that, 2 days ago she had the sensation in her stomach tearing/opening followed by a large black bowel movement.  She reports feeling dizzy/faint.  She denies Taunia Frasco blood in her stool, and hematemesis.  She is not currently having any abdominal pain.  She reports that she does occasionally have epigastric discomfort feels like her typical GERD symptoms.  She is afebrile in clinic today.  Chief Complaint noted Review of Symptoms - see HPI PMH - Smoking status noted.    Objective:  Physical Exam: BP 130/60   Pulse 85   Temp 97.9 F (36.6 C) (Oral)   Ht 4\' 11"  (1.499 m)   Wt 133 lb (60.3 kg)   SpO2 99%   BMI 26.86 kg/m    Gen: NAD, resting comfortably Respiratory: breathing comfortably on room air. Abdomen: non-tender to palpation Rectal: no Leathia Farnell blood in the rectum. No fissures or structural abnormalities noted on exam.  Stool obtained for assessment.  Results for orders placed or performed in visit on 07/19/18 (from the past 72 hour(s))  POCT hemoglobin     Status: None   Collection Time: 07/19/18  3:16 PM  Result Value Ref Range   Hemoglobin 11.2 11 - 14.6 g/dL  POCT occult blood stool     Status: None   Collection Time: 07/19/18  4:00 PM  Result Value Ref Range   Fecal Occult Blood, POC Negative Negative   Card #1 Date 07/19/2018      Assessment/Plan:  Black stool Her vitals today appears stable with blood pressure of 106, heart rate 65.  Her lab results are notable for a hemoglobin of 11.2 (from 11.6 months ago), stool occult negative.  The most likely cause of dark stools at this time from her iron supplement possibly dietary contribution.  She was informed  that it is unlikely that she is experiencing another GI bleed at this time.  She was encouraged to follow-up with GI for her ongoing GERD complaints.

## 2018-07-19 NOTE — Assessment & Plan Note (Signed)
Her vitals today appears stable with blood pressure of 106, heart rate 65.  Her lab results are notable for a hemoglobin of 11.2 (from 11.6 months ago), stool occult negative.  The most likely cause of dark stools at this time from her iron supplement possibly dietary contribution.  She was informed that it is unlikely that she is experiencing another GI bleed at this time.  She was encouraged to follow-up with GI for her ongoing GERD complaints.

## 2018-07-24 ENCOUNTER — Other Ambulatory Visit: Payer: Self-pay

## 2018-07-24 ENCOUNTER — Telehealth: Payer: Self-pay

## 2018-07-24 ENCOUNTER — Ambulatory Visit (INDEPENDENT_AMBULATORY_CARE_PROVIDER_SITE_OTHER): Payer: Medicare Other | Admitting: Gastroenterology

## 2018-07-24 DIAGNOSIS — R1310 Dysphagia, unspecified: Secondary | ICD-10-CM | POA: Diagnosis not present

## 2018-07-24 DIAGNOSIS — K219 Gastro-esophageal reflux disease without esophagitis: Secondary | ICD-10-CM

## 2018-07-24 DIAGNOSIS — K449 Diaphragmatic hernia without obstruction or gangrene: Secondary | ICD-10-CM | POA: Diagnosis not present

## 2018-07-24 DIAGNOSIS — K5900 Constipation, unspecified: Secondary | ICD-10-CM

## 2018-07-24 DIAGNOSIS — D649 Anemia, unspecified: Secondary | ICD-10-CM | POA: Diagnosis not present

## 2018-07-24 MED ORDER — SENNOSIDES 15 MG PO TABS: 15.0000 mg | ORAL_TABLET | Freq: Every day | ORAL | 1 refills | Status: DC

## 2018-07-24 MED ORDER — BISACODYL 5 MG PO TBEC: 5.0000 mg | DELAYED_RELEASE_TABLET | Freq: Every day | ORAL | 1 refills | Status: DC | PRN

## 2018-07-24 MED ORDER — SUCRALFATE 1 GM/10ML PO SUSP: 1.0000 g | Freq: Four times a day (QID) | ORAL | 1 refills | Status: DC | PRN

## 2018-07-24 NOTE — Patient Instructions (Addendum)
If you are age 83 or older, your body mass index should be between 23-30. Your There is no height or weight on file to calculate BMI. If this is out of the aforementioned range listed, please consider follow up with your Primary Care Provider.  If you are age 79 or younger, your body mass index should be between 19-25. Your There is no height or weight on file to calculate BMI. If this is out of the aformentioned range listed, please consider follow up with your Primary Care Provider.   Increase your acid reflux medication, omeprazole (Protonix), to twice a day before meals.  We have sent the following medications to [facility] pharmacy:  Carafate suspension : Take 29mls every 6 hours as needed   Senna 15mg : Take 1 to 2 tablets every day for constipation  Dulcolax 5mg : Take 1 tablet daily, as needed for constipation    Stop taking Miralax and Metamucil   Please call us back in a couple of weeks if you are not feeling better.  Thank you for entrusting me with your care and for choosing [facility], Dr. [place] Cellar

## 2018-07-24 NOTE — Telephone Encounter (Signed)
Scheduled patient for Prolia injection- she owes $63

## 2018-07-24 NOTE — Progress Notes (Signed)
THIS ENCOUNTER IS A VIRTUAL VISIT DUE TO COVID-19 - PATIENT WAS NOT SEEN IN THE OFFICE. PATIENT HAS CONSENTED TO VIRTUAL VISIT / TELEMEDICINE VISIT. PATIENT WAS NOT ABLE TO USE APP FOR VISUAL CAPABILITY DUE TO TECHNICAL DIFFICULTIES, ONLY AUDIO USED   Location of patient: home Location of provider: office Persons participating: myself, patient Time spent on call: 25 minutes   HPI :  85 year old female with a history of Xarelto use for atrial fibrillation, ICD implant for history of ventricular tachycardia, history of iron deficiency anemia, here for a follow-up visit.  Her iron deficiency anemia has been attributed to large hiatal hernia and Lysbeth Galas lesions based on workup done when admitted for anemia in April 2019. She has been taking iron supplementation and had previously been omprazole 40mg  twice daily. Has more recently been on omprazole 40mg  once daily. She remains on Xarelto.   Heartburn bothering her, throat burns a lot with hoarse voice. She thinks hoarseness for 3 months. She is taking omeprazole 40mg  once daily. Heartburn bothers her periodically, she thinks at least twice per week she has some breakthrough bothersome symptoms, sometimes more frequent. She is very careful how she eats due to dysphagia which has recurred, she thinks worse over 4 weeks, mostly to solids. 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 Last dilation was in 2018. She has been staying on foods that are soft to minimize symptoms. She has had benefit from dysphagia from prior dilation.   She has some ongoing constipation due to iron use. She reports on April 22 was her last bowel movement. One BM every week to several days. She thought she had a dark bowel movement in recent weeks, tested negative for blood and Hgb stable. She thinks stools are normally dark and hard. She has been taking iron supplementation, has tried reducing it to every other  day but constipation persists. She is taking metamucil for her bowels as well as Miralax which she thinks can be too strong at times and she has a hard time controlling her bowel movements with the Miralax, does not like taking it. She has been having some lower abdominal discomfort when constipated, relieved with a bowel movements.   Hgb on 07/19/18 was 11.2 (prior baseline 11.6 one year ago). Stool was negative for occult blood on 4/23.  She also complains of urination at night, worsening increased frequency over the past month. No dysuria or fevers.   Prior endoscopic history: EGD 07/19/2017 - 5cm HH with Lysbeth Galas lesions, normal stomach, duodenal diverticulum Colonoscopy 07/19/2017 - nl TI, left sided diverticuli, no cause for iron deficiency EGD 02/27/2017 - GEJ stricture dilated to 73mm, duodenal diverticulum, 5cm HH EGD 05/23/16 - GEJ stricture, dilated to 84mm, duodenal diverticulum, 5cm HH    Past Medical History:  Diagnosis Date  . Anemia   . Arthritis   . Basal cell carcinoma    nose  . Diabetes mellitus without complication ([place])    pt denies diabetes  . GERD (gastroesophageal reflux disease)   . Hiatal hernia   . HLD (hyperlipidemia)   . Hypertension   . Seizure ([place]) 2011     Past Surgical History:  Procedure Laterality Date  . CARDIAC DEFIBRILLATOR PLACEMENT    . CATARACT EXTRACTION Left 02/2001  . CHOLECYSTECTOMY    . COLONOSCOPY WITH PROPOFOL N/A 07/19/2017   Procedure: COLONOSCOPY WITH PROPOFOL;  Surgeon: Jerene Bears, MD;  Location: [facility]  ENDOSCOPY;  Service: Gastroenterology;  Laterality: N/A;  . ESOPHAGOGASTRODUODENOSCOPY (EGD) WITH PROPOFOL N/A 07/19/2017   Procedure: ESOPHAGOGASTRODUODENOSCOPY (EGD) WITH PROPOFOL;  Surgeon: Jerene Bears, MD;  Location: [facility] ENDOSCOPY;  Service: Gastroenterology;  Laterality: N/A;  . LUMBAR LAMINECTOMY  06/11/2001  . LUMBAR LAMINECTOMY  02/09/2006  . LUMBAR LAMINECTOMY  07/07/2006   with spianal cord stimulator  . SPINAL CORD  STIMULATOR REMOVAL  14/2009  . TUBAL LIGATION     Family History  Problem Relation Age of Onset  . Diabetes Mother   . Heart failure Father   . Breast cancer Sister   . Heart failure Sister   . Diabetes Son   . Diabetes Sister   . Colon cancer Neg Hx   . Stomach cancer Neg Hx   . Rectal cancer Neg Hx   . Esophageal cancer Neg Hx    Social History   Tobacco Use  . Smoking status: Never Smoker  . Smokeless tobacco: Never Used  Substance Use Topics  . Alcohol use: No  . Drug use: No   Current Outpatient Medications  Medication Sig Dispense Refill  . atorvastatin (LIPITOR) 40 MG tablet Take 1 tablet (40 mg total) by mouth daily. 90 tablet 2  . Cholecalciferol (VITAMIN D-3) 1000 units CAPS Take 1,000 Units by mouth daily.    . clobetasol ointment (TEMOVATE) 4.48 % Apply 1 application topically 2 (two) times daily. (Patient taking differently: Apply 1 application topically as needed. ) 30 g 2  . ferrous sulfate 325 (65 FE) MG tablet Take 325 mg by mouth daily with breakfast.    . methylcellulose oral powder Take by mouth daily as needed.    . metoprolol tartrate (LOPRESSOR) 50 MG tablet Take 1 tablet (50 mg total) by mouth 2 (two) times daily. 180 tablet 2  . Multiple Vitamins-Minerals (ICAPS AREDS 2 PO) Take 1 capsule by mouth 2 (two) times daily.     Marland Kitchen omeprazole (PRILOSEC) 40 MG capsule Take 1 capsule (40 mg total) by mouth 2 (two) times daily. Take before breakfast and dinner. (Patient taking differently: Take 40 mg by mouth daily. Take before breakfast and dinner.) 180 capsule 3  . Propylene Glycol (SYSTANE COMPLETE) 0.6 % SOLN Place 1 drop into both eyes daily.    . Rivaroxaban (XARELTO) 15 MG TABS tablet Take 1 tablet (15 mg total) by mouth daily with supper. 90 tablet 2  . tolterodine (DETROL LA) 2 MG 24 hr capsule TAKE 1 CAPSULE(2 MG) BY MOUTH DAILY 90 capsule 1  . verapamil (CALAN-SR) 240 MG CR tablet Take 1 tablet (240 mg total) by mouth at bedtime. 90 tablet 2   No  current facility-administered medications for this visit.    Allergies  Allergen Reactions  . Sotalol Other (See Comments)    Seizures   . Erythromycin Nausea Only  . Augmentin [Amoxicillin-Pot Clavulanate] Rash  . Crestor [Rosuvastatin Calcium] Rash  . Keflex [Cephalexin] Rash  . Septra [Sulfamethoxazole-Trimethoprim] Rash     Review of Systems: All systems reviewed and negative except where noted in HPI.   Lab Results  Component Value Date   WBC 9.2 12/21/2017   HGB 11.2 07/19/2018   HCT 34.5 (L) 12/21/2017   MCV 99.1 12/21/2017   PLT 243 12/21/2017    Lab Results  Component Value Date   CREATININE 1.29 (H) 03/01/2018   BUN 24 03/01/2018   NA 135 03/01/2018   K 4.1 03/01/2018   CL 102 03/01/2018   CO2 22 03/01/2018  Lab Results  Component Value Date   ALT 12 04/20/2017   AST 14 04/20/2017   ALKPHOS 113 04/20/2017   BILITOT <0.2 04/20/2017    CBC Latest Ref Rng & Units 07/19/2018 12/21/2017 12/11/2017  WBC 4.0 - 10.5 K/uL - 9.2 12.9(H)  Hemoglobin 11 - 14.6 g/dL 11.2 11.6(L) 11.3(L)  Hematocrit 36.0 - 46.0 % - 34.5(L) 34.8(L)  Platelets 150 - 400 K/uL - 243 187     Physical Exam: NA   ASSESSMENT AND PLAN:  85 y/o female here for reassessment of the following issues:  GERD / Dysphagia / hiatal hernia - some worsening reflux symptoms lately with some hoarseness of the voice which could be related. Also some recurrent dysphagia likely due to known GEJ stricture. Discussed options with the patient. Recommend she increase her omeprazole to 40mg  twice daily in light of her recurrent reflux, and update me in 2 weeks to see if this has helped. Will also give some liquid carafate to use PRN for breakthrough. Unfortunately her large hiatal hernia is likely driving this, but would not recommend surgical repair of the hernia in light of age / co morbidities unless she has refractory symptoms / anemia from the hernia. Regarding the dysphagia, seems mild at this time,  would see how she does with more aggressive treatment of reflux. Ultimately she may warrant repeat EGD with dilation however at her age and comorbidities she is higher risk for coronavirus right now, and would need to hold her Xarelto. She will touch base with me in a few weeks, if dysphagia is progressing we will need to bring her in for EGD with dilation, but she agreed to hold off right now and stay on soft diet.  Anemia - previously due to [facility] lesions / hiatal hernia based on prior workup. Hgb stable, dark stools due to iron use  Constipation - likely due to iron use. Dark stools likely due to iron, does not sound like she had melena. She does not like using Miralax and wants to stop it. Recommend she stop metamucil, it is not working. Will try senna 2 tabs / day and can use dulcolax PRN. If no benefit she can call for reassessment. Colonoscopy up to date.   I recommend she touch base with her PCP about urinary symptoms if these persist and bother her, she agreed.  [place] Cellar, MD [facility]

## 2018-07-27 ENCOUNTER — Emergency Department (HOSPITAL_COMMUNITY): Payer: Medicare Other

## 2018-07-27 ENCOUNTER — Other Ambulatory Visit: Payer: Self-pay

## 2018-07-27 ENCOUNTER — Emergency Department (HOSPITAL_COMMUNITY)
Admission: EM | Admit: 2018-07-27 | Discharge: 2018-07-27 | Disposition: A | Discharge status: Home / Self Care | Payer: Medicare Other | Attending: Emergency Medicine | Admitting: Emergency Medicine

## 2018-07-27 DIAGNOSIS — Y92009 Unspecified place in unspecified non-institutional (private) residence as the place of occurrence of the external cause: Secondary | ICD-10-CM | POA: Diagnosis not present

## 2018-07-27 DIAGNOSIS — Z85828 Personal history of other malignant neoplasm of skin: Secondary | ICD-10-CM | POA: Insufficient documentation

## 2018-07-27 DIAGNOSIS — E119 Type 2 diabetes mellitus without complications: Secondary | ICD-10-CM | POA: Insufficient documentation

## 2018-07-27 DIAGNOSIS — R2689 Other abnormalities of gait and mobility: Secondary | ICD-10-CM | POA: Insufficient documentation

## 2018-07-27 DIAGNOSIS — M545 Low back pain: Secondary | ICD-10-CM | POA: Diagnosis present

## 2018-07-27 DIAGNOSIS — W1830XA Fall on same level, unspecified, initial encounter: Secondary | ICD-10-CM | POA: Diagnosis not present

## 2018-07-27 DIAGNOSIS — S300XXA Contusion of lower back and pelvis, initial encounter: Secondary | ICD-10-CM

## 2018-07-27 DIAGNOSIS — Y999 Unspecified external cause status: Secondary | ICD-10-CM | POA: Diagnosis not present

## 2018-07-27 DIAGNOSIS — I4891 Unspecified atrial fibrillation: Secondary | ICD-10-CM | POA: Insufficient documentation

## 2018-07-27 DIAGNOSIS — Z7901 Long term (current) use of anticoagulants: Secondary | ICD-10-CM | POA: Diagnosis not present

## 2018-07-27 DIAGNOSIS — I10 Essential (primary) hypertension: Secondary | ICD-10-CM | POA: Insufficient documentation

## 2018-07-27 DIAGNOSIS — Y9301 Activity, walking, marching and hiking: Secondary | ICD-10-CM | POA: Diagnosis not present

## 2018-07-27 DIAGNOSIS — R296 Repeated falls: Secondary | ICD-10-CM

## 2018-07-27 LAB — BASIC METABOLIC PANEL
Anion gap: 10 (ref 5–15)
BUN: 23 mg/dL (ref 8–23)
CO2: 20 mmol/L — ABNORMAL LOW (ref 22–32)
Calcium: 8.4 mg/dL — ABNORMAL LOW (ref 8.9–10.3)
Chloride: 105 mmol/L (ref 98–111)
Creatinine, Ser: 1.17 mg/dL — ABNORMAL HIGH (ref 0.44–1.00)
GFR calc Af Amer: 50 mL/min — ABNORMAL LOW (ref >60)
GFR calc non Af Amer: 43 mL/min — ABNORMAL LOW (ref >60)
Glucose, Bld: 181 mg/dL — ABNORMAL HIGH (ref 70–99)
Potassium: 3.8 mmol/L (ref 3.5–5.1)
Sodium: 135 mmol/L (ref 135–145)

## 2018-07-27 LAB — URINALYSIS, ROUTINE W REFLEX MICROSCOPIC
Bilirubin Urine: NEGATIVE
Glucose, UA: NEGATIVE mg/dL
Hgb urine dipstick: NEGATIVE
Ketones, ur: NEGATIVE mg/dL
Leukocytes,Ua: NEGATIVE
Nitrite: NEGATIVE
Protein, ur: NEGATIVE mg/dL
Specific Gravity, Urine: 1.013 (ref 1.005–1.030)
pH: 5 (ref 5.0–8.0)

## 2018-07-27 LAB — CBC WITH DIFFERENTIAL/PLATELET
Abs Immature Granulocytes: 0.21 K/uL — ABNORMAL HIGH (ref 0.00–0.07)
Basophils Absolute: 0.1 K/uL (ref 0.0–0.1)
Basophils Relative: 0 %
Eosinophils Absolute: 0.2 K/uL (ref 0.0–0.5)
Eosinophils Relative: 1 %
HCT: 33.5 % — ABNORMAL LOW (ref 36.0–46.0)
Hemoglobin: 11.5 g/dL — ABNORMAL LOW (ref 12.0–15.0)
Immature Granulocytes: 1 %
Lymphocytes Relative: 8 %
Lymphs Abs: 1.5 K/uL (ref 0.7–4.0)
MCH: 33.9 pg (ref 26.0–34.0)
MCHC: 34.3 g/dL (ref 30.0–36.0)
MCV: 98.8 fL (ref 80.0–100.0)
Monocytes Absolute: 1.1 K/uL — ABNORMAL HIGH (ref 0.1–1.0)
Monocytes Relative: 6 %
Neutro Abs: 14.9 K/uL — ABNORMAL HIGH (ref 1.7–7.7)
Neutrophils Relative %: 84 %
Platelets: 217 K/uL (ref 150–400)
RBC: 3.39 MIL/uL — ABNORMAL LOW (ref 3.87–5.11)
RDW: 12.9 % (ref 11.5–15.5)
WBC: 18 K/uL — ABNORMAL HIGH (ref 4.0–10.5)
nRBC: 0 % (ref 0.0–0.2)

## 2018-07-27 MED ORDER — ATORVASTATIN CALCIUM 40 MG PO TABS: 40.0000 mg | ORAL_TABLET | Freq: Every day | ORAL | Status: DC

## 2018-07-27 MED ORDER — FESOTERODINE FUMARATE ER 4 MG PO TB24: 4.0000 mg | ORAL_TABLET | Freq: Every day | ORAL | Status: DC

## 2018-07-27 MED ORDER — SODIUM CHLORIDE 0.9 % IV BOLUS
500.0000 mL | Freq: Once | INTRAVENOUS | Status: AC
  Administered 2018-07-27: 08:00:00 500 mL via INTRAVENOUS

## 2018-07-27 MED ORDER — BISACODYL 5 MG PO TBEC: 5.0000 mg | DELAYED_RELEASE_TABLET | Freq: Every day | ORAL | Status: DC | PRN

## 2018-07-27 MED ORDER — SENNOSIDES 15 MG PO TABS: 15.0000 mg | ORAL_TABLET | Freq: Every day | ORAL | Status: DC

## 2018-07-27 MED ORDER — METOPROLOL TARTRATE 25 MG PO TABS: 50.0000 mg | ORAL_TABLET | Freq: Two times a day (BID) | ORAL | Status: DC

## 2018-07-27 MED ORDER — VERAPAMIL HCL ER 240 MG PO TBCR: 240.0000 mg | EXTENDED_RELEASE_TABLET | Freq: Every day | ORAL | Status: DC

## 2018-07-27 MED ORDER — FERROUS SULFATE 325 (65 FE) MG PO TABS: 325.0000 mg | ORAL_TABLET | Freq: Every day | ORAL | Status: DC

## 2018-07-27 MED ORDER — PANTOPRAZOLE SODIUM 40 MG PO TBEC: 40.0000 mg | DELAYED_RELEASE_TABLET | Freq: Every day | ORAL | Status: DC

## 2018-07-27 MED ORDER — PROPYLENE GLYCOL 0.6 % OP SOLN: 1.0000 [drp] | Freq: Every day | OPHTHALMIC | Status: DC

## 2018-07-27 NOTE — ED Provider Notes (Signed)
85 year old female that I received in signout from Dr. Melina Copa.  Briefly the patient is a 85 year old female that had a fall and now is having too much pain to stand or ambulate.  Evaluation including orthopedic consult and CT scan of the pelvis is negative for acute fracture.  Plan is to have social work evaluate for possible placement.  The patient and family did not feel comfortable staying in the emergency department with the current coronavirus pandemic.  They are requesting to be discharged home and have home health come out to try and provide assistance.  Social work will provide this for them.  Orders are placed.  Discharge home.   Deno Etienne, DO 07/27/18 [id number]

## 2018-07-27 NOTE — Discharge Instructions (Signed)
Follow up with your family doc.  Return for any worsening symptoms or if you feel you are not doing well at home.

## 2018-07-27 NOTE — Progress Notes (Signed)
CSW spoke to the pt who stated she would like to go home.  CSW reviewed pt's options with the pt.  1. Pt can await placement in the ED if pt does not feel safe returning home if EDP is agreeable that pt is not safe to D/C home.  2.  Pt can await placement feom home pending [facility] Medicare's decision on authorizing payment for the pt's SNF rehab.  3. Pt can refuse SNF placement and return home to rehabilitate utilizing [facility] PT/OT.  Of note: Pt states she cannot ambulate.  Pt is requesting CSW explain situation to adult children who live with her and is requesting that pt's children call her back via the RN's phone at ph: [phone] as pt cannot hear well without her hearing aids which were left at her home when pt was BIB EMS.  CSW to call pt's children to explain situation and request they call pt back.  CSW will continue to follow for D/C needs.  Alphonse Guild. Desirre Eickhoff, LCSW, LCAS, CSI Transitions of Care Clinical Social Worker Care Coordination Department Ph: [phone]

## 2018-07-27 NOTE — ED Provider Notes (Signed)
[place] EMERGENCY DEPARTMENT Provider Note   CSN: [id number] Arrival date & time: 07/27/18  6767    History   Chief Complaint Chief Complaint  Patient presents with  . Fall    HPI Brenda [name] is a 85 y.o. female.  She is on anticoagulation.  She presents by EMS after a ground-level fall at home today.  She said she was walking back from the bathroom to the bedroom when her legs became weak and she fell onto her tailbone.  She is complaining of tailbone pain.  She denies striking her head or losing consciousness.  She said she has had a couple falls over the past few days and she says it is because her legs give out on her.  She thinks she has been eating and drinking okay but she said she does not urinate much.  Denies any fevers or chills cough chest pain shortness of breath abdominal pain diarrhea or urinary symptoms.     The history is provided by the patient.  Fall  This is a recurrent problem. The current episode started 1 to 2 hours ago. The problem has not changed since onset.Pertinent negatives include no chest pain, no abdominal pain, no headaches and no shortness of breath. Nothing aggravates the symptoms. Nothing relieves the symptoms. She has tried nothing for the symptoms. The treatment provided no relief.    Past Medical History:  Diagnosis Date  . Anemia   . Arthritis   . Basal cell carcinoma    nose  . Diabetes mellitus without complication ([place])    pt denies diabetes  . GERD (gastroesophageal reflux disease)   . Hiatal hernia   . HLD (hyperlipidemia)   . Hypertension   . Seizure [facility]) 2011    Patient Active Problem List   Diagnosis Date Noted  . Black stool 07/19/2018  . Constipation 07/16/2018  . Melena 07/16/2018  . Lichen sclerosus 20/94/7096  . Grief reaction 01/19/2018  . Vaginal irritation 10/23/2017  . Acute pain of left shoulder 07/27/2017  . GERD (gastroesophageal reflux disease) 07/18/2017  . Gastrointestinal  hemorrhage   . Symptomatic anemia 07/17/2017  . Iron deficiency anemia 05/09/2017  . Osteoporosis 11/24/2016  . Skin cancer of nose 11/23/2016  . 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 UTI (urinary tract infection) 10/26/2016  . Paroxysmal A-fib ([place]) 08/05/2016  . AKI (acute kidney injury) ([place]) 05/25/2016  . Essential hypertension 05/05/2016  . Type 2 diabetes mellitus without complication, without long-term current use of insulin ([place]) 05/05/2016  . Hiatal hernia 05/05/2016  . History of CVA (cerebrovascular accident) without residual deficits 05/05/2016  . Memory change 05/05/2016  . AICD (automatic cardioverter/defibrillator) present 05/05/2016  . Back pain 05/05/2016    Past Surgical History:  Procedure Laterality Date  . CARDIAC DEFIBRILLATOR PLACEMENT    . CATARACT EXTRACTION Left 02/2001  . CHOLECYSTECTOMY    . COLONOSCOPY WITH PROPOFOL N/A 07/19/2017   Procedure: COLONOSCOPY WITH PROPOFOL;  Surgeon: Jerene Bears, MD;  Location: [place];  Service: Gastroenterology;  Laterality: N/A;  . ESOPHAGOGASTRODUODENOSCOPY (EGD) WITH PROPOFOL N/A 07/19/2017   Procedure: ESOPHAGOGASTRODUODENOSCOPY (EGD) WITH PROPOFOL;  Surgeon: Jerene Bears, MD;  Location: [facility] ENDOSCOPY;  Service: Gastroenterology;  Laterality: N/A;  . LUMBAR LAMINECTOMY  06/11/2001  . LUMBAR LAMINECTOMY  02/09/2006  . LUMBAR LAMINECTOMY  07/07/2006   with spianal cord stimulator  . SPINAL CORD STIMULATOR REMOVAL  14/2009  . TUBAL LIGATION       OB History   No obstetric  history on file.      Home Medications    Prior to Admission medications   Medication Sig Start Date End Date Taking? Authorizing Provider  atorvastatin (LIPITOR) 40 MG tablet Take 1 tablet (40 mg total) by mouth daily. 04/23/18   Camnitz, Ocie Doyne, MD  bisacodyl (DULCOLAX) 5 MG EC tablet Take 1 tablet (5 mg total) by mouth daily as needed for moderate constipation. 07/24/18   Armbruster, Carlota Raspberry, MD  Cholecalciferol (VITAMIN D-3) 1000  units CAPS Take 1,000 Units by mouth daily.    [provider]  clobetasol ointment (TEMOVATE) 4.25 % Apply 1 application topically 2 (two) times daily. Patient taking differently: Apply 1 application topically as needed.  01/17/18   Sela Hilding, MD  ferrous sulfate 325 (65 FE) MG tablet Take 325 mg by mouth daily with breakfast.    [provider]  methylcellulose oral powder Take by mouth daily as needed.    [provider]  metoprolol tartrate (LOPRESSOR) 50 MG tablet Take 1 tablet (50 mg total) by mouth 2 (two) times daily. 04/23/18   Camnitz, Ocie Doyne, MD  Multiple Vitamins-Minerals (ICAPS AREDS 2 PO) Take 1 capsule by mouth 2 (two) times daily.     [provider]  omeprazole (PRILOSEC) 40 MG capsule Take 1 capsule (40 mg total) by mouth 2 (two) times daily. Take before breakfast and dinner. 07/24/18   Armbruster, Carlota Raspberry, MD  Propylene Glycol (SYSTANE COMPLETE) 0.6 % SOLN Place 1 drop into both eyes daily.    [provider]  Rivaroxaban (XARELTO) 15 MG TABS tablet Take 1 tablet (15 mg total) by mouth daily with supper. 04/20/18   Camnitz, Will Hassell Done, MD  Sennosides 15 MG TABS Take 1-2 tablets (15-30 mg total) by mouth daily. 07/24/18   Armbruster, Carlota Raspberry, MD  sucralfate (CARAFATE) 1 GM/10ML suspension Take 10 mLs (1 g total) by mouth every 6 (six) hours as needed. 07/24/18   Armbruster, Carlota Raspberry, MD  tolterodine (DETROL LA) 2 MG 24 hr capsule TAKE 1 CAPSULE(2 MG) BY MOUTH DAILY 04/19/18   Sela Hilding, MD  verapamil (CALAN-SR) 240 MG CR tablet Take 1 tablet (240 mg total) by mouth at bedtime. 04/23/18   Camnitz, Ocie Doyne, MD    Family History Family History  Problem Relation Age of Onset  . Diabetes Mother   . Heart failure Father   . Breast cancer Sister   . Heart failure Sister   . Diabetes Son   . Diabetes Sister   . Colon cancer Neg Hx   . Stomach cancer Neg Hx   . Rectal cancer Neg Hx   . Esophageal cancer Neg Hx      Social History Social History   Tobacco Use  . Smoking status: Never Smoker  . Smokeless tobacco: Never Used  Substance Use Topics  . Alcohol use: No  . Drug use: No     Allergies   Sotalol; Erythromycin; Augmentin [amoxicillin-pot clavulanate]; Crestor [rosuvastatin calcium]; Keflex [cephalexin]; and Septra [sulfamethoxazole-trimethoprim]   Review of Systems Review of Systems  Constitutional: Negative for fever.  HENT: Negative for sore throat.   Eyes: Negative for visual disturbance.  Respiratory: Negative for shortness of breath.   Cardiovascular: Negative for chest pain.  Gastrointestinal: Negative for abdominal pain.  Genitourinary: Negative for dysuria.  Musculoskeletal: Negative for neck pain.  Skin: Positive for wound (left wrist). Negative for rash.  Neurological: Negative for headaches.     Physical Exam Updated Vital Signs BP Marland Kitchen)  122/59   Pulse 90   Temp 98.5 F (36.9 C) (Oral)   Resp 14   Ht 4\' 11"  (1.499 m)   Wt 60.3 kg   SpO2 99%   BMI 26.86 kg/m   Physical Exam Vitals signs and nursing note reviewed.  Constitutional:      General: She is not in acute distress.    Appearance: She is well-developed.  HENT:     Head: Normocephalic and atraumatic.  Eyes:     Conjunctiva/sclera: Conjunctivae normal.  Neck:     Musculoskeletal: Neck supple.  Cardiovascular:     Rate and Rhythm: Normal rate and regular rhythm.     Heart sounds: No murmur.  Pulmonary:     Effort: Pulmonary effort is normal. No respiratory distress.     Breath sounds: Normal breath sounds.  Abdominal:     Palpations: Abdomen is soft.     Tenderness: There is no abdominal tenderness.  Musculoskeletal: Normal range of motion.        General: No deformity.     Right lower leg: No edema.     Left lower leg: No edema.     Comments: Tender lower back lumbar and sacral/coccyx.   Skin:    General: Skin is warm and dry.     Capillary Refill: Capillary refill takes less than 2  seconds.  Neurological:     General: No focal deficit present.     Mental Status: She is alert and oriented to person, place, and time.     Sensory: No sensory deficit.     Motor: No weakness.      ED Treatments / Results  Labs (all labs ordered are listed, but only abnormal results are displayed) Labs Reviewed  CBC WITH DIFFERENTIAL/PLATELET - Abnormal; Notable for the following components:      Result Value   WBC 18.0 (*)    RBC 3.39 (*)    Hemoglobin 11.5 (*)    HCT 33.5 (*)    Neutro Abs 14.9 (*)    Monocytes Absolute 1.1 (*)    Abs Immature Granulocytes 0.21 (*)    All other components within normal limits  BASIC METABOLIC PANEL - Abnormal; Notable for the following components:   CO2 20 (*)    Glucose, Bld 181 (*)    Creatinine, Ser 1.17 (*)    Calcium 8.4 (*)    GFR calc non Af Amer 43 (*)    GFR calc Af Amer 50 (*)    All other components within normal limits  URINALYSIS, ROUTINE W REFLEX MICROSCOPIC    EKG EKG Interpretation  Date/Time:  Friday Jul 27 2018 07:34:23 EDT Ventricular Rate:  89 PR Interval:    QRS Duration: 94 QT Interval:  381 QTC Calculation: 464 R Axis:   27 Text Interpretation:  Sinus rhythm nonspecific st/ts similar to prior 4/19 Confirmed by Aletta Edouard [phone]) on 07/27/2018 7:48:35 AM   Radiology Dg Chest 1 View  Result Date: 07/27/2018 CLINICAL DATA:  Fall while walking to the bathroom this morning with 3 falls over the past 2 days. Pelvic fracture 2 years ago. EXAM: CHEST  1 VIEW COMPARISON:  07/17/2017 FINDINGS: Left-sided pacemaker is unchanged. Lungs are hypoinflated without consolidation or effusion. Cardiomediastinal silhouette and remainder of the exam is unchanged. IMPRESSION: No active disease. Electronically Signed   By: Marin Olp M.D.   On: 07/27/2018 08:10   Dg Pelvis 1-2 Views  Result Date: 07/27/2018 CLINICAL DATA:  Fall while walking to bathroom  this morning. Three falls over the past 2 days. Pelvic fractures years  ago. EXAM: PELVIS - 1-2 VIEW COMPARISON:  07/17/2017 and CT 12/21/2017 FINDINGS: There is diffuse osteopenia. Mild degenerative changes of the hips. Old left inferior pubic ramus fracture. No evidence of acute fracture or dislocation. Hardware intact and unchanged over the lumbosacral spine. Mild degenerate change of the sacroiliac joints. IMPRESSION: No acute findings. Old left inferior pubic ramus fracture. Osteopenia. Mild degenerative change of the hips. Electronically Signed   By: Marin Olp M.D.   On: 07/27/2018 08:16   Ct Head Wo Contrast  Result Date: 07/27/2018 CLINICAL DATA:  Head injury after fall. EXAM: CT HEAD WITHOUT CONTRAST TECHNIQUE: Contiguous axial images were obtained from the base of the skull through the vertex without intravenous contrast. COMPARISON:  CT scan of July 17, 2017. FINDINGS: Brain: Mild diffuse cortical atrophy. Mild chronic ischemic white matter disease. No mass effect or midline shift is noted. Ventricular size is within normal limits. There is no evidence of mass lesion, hemorrhage or acute infarction. Vascular: No hyperdense vessel or unexpected calcification. Skull: Normal. Negative for fracture or focal lesion. Sinuses/Orbits: No acute finding. Other: None. IMPRESSION: Mild diffuse cortical atrophy. Mild chronic ischemic white matter disease. No acute intracranial abnormality seen. Electronically Signed   By: Marijo Conception M.D.   On: 07/27/2018 07:58   Ct Lumbar Spine Wo Contrast  Result Date: 07/27/2018 CLINICAL DATA:  Low back pain radiating to both hips left worse than right. Fell today. EXAM: CT LUMBAR SPINE WITHOUT CONTRAST TECHNIQUE: Multidetector CT imaging of the lumbar spine was performed without intravenous contrast administration. Multiplanar CT image reconstructions were also generated. COMPARISON:  09/13/2017 FINDINGS: Segmentation: 5 lumbar type vertebral bodies as numbered previously. Alignment: No traumatic malalignment. Vertebrae: Old compression  fracture at T12 treated with augmentation. Chronic unhealed component at the superior endplate. No evidence of progression since June of last year. Old compression fracture at L2 with retropulsion. No change since last year. Other levels do not show a fracture. See results of pelvic study for description of sacral fractures. Paraspinal and other soft tissues: Negative Disc levels: Previous fusion procedure from L2 to the sacrum. Fusion appears solid within that region in there is no evidence of hardware complication. IMPRESSION: No acute fracture in the region from inferior T10 through L5-S1. Old augmented fracture at T12 which has not progressed since June of last year. There remains an unhealed component at the superior endplate. Old fracture at L2 with retropulsion. No hardware complication. Sacral fractures, described completely on the pelvic CT. Electronically Signed   By: Nelson Chimes M.D.   On: 07/27/2018 12:50   Ct Pelvis Wo Contrast  Result Date: 07/27/2018 CLINICAL DATA:  Fall, history of pelvic fractures in the past, trauma. EXAM: CT PELVIS WITHOUT CONTRAST TECHNIQUE: Multidetector CT imaging of the pelvis was performed following the standard protocol without intravenous contrast. COMPARISON:  12/21/2017 FINDINGS: Bones: Graft harvest site from the right posterior iliac bone. Lower lumbar laminectomies with posterolateral rod and pedicle screw fixators in addition to anterior fusion at L5-S1. Abnormal cortical irregularity anteriorly in both sacral ala with associated vertically oriented indistinctly marginated bands of sclerosis favoring sacral insufficiency fracture. There is some new mild anterior convexity and irregularity in the S2 vertebral suspicious for fracture, as well as sclerosis and cortical irregularity anteriorly in the upper portion of the S3 vertebra which is increased from prior and may also represent a transverse component of sacral insufficiency fracture. Old healed deformities  of  the left pubic rami noted. No proximal femoral fracture is identified. No hip joint effusion noted. Musculature: Mild regional muscular atrophy. Small amount of density in the left gluteus maximus muscle measuring about 4.7 by 2.1 by 3.2 cm (volume = 17 cm^3), favoring hematoma. Other: Sigmoid colon diverticulosis. Pelvic floor laxity. Aortoiliac atherosclerotic vascular disease. IMPRESSION: 1. Vertical bands of sclerosis in the sacral ala with transverse bands of sclerosis at S2 and S3, probably from chronic sacral insufficiency fracture. I doubt that this is acute from a fall. 2. Approximately 17 cubic cm of high density favoring hematoma in the left gluteus maximus muscle, likely posttraumatic. 3. Postoperative findings in the lumbar spine. Old healed fractures of the left pubic rami. 4. Pelvic floor laxity. 5. Sigmoid colon diverticulosis. 6.  Aortic Atherosclerosis (ICD10-I70.0). Electronically Signed   By: Van Clines M.D.   On: 07/27/2018 12:55    Procedures Procedures (including critical care time)  Medications Ordered in ED Medications  atorvastatin (LIPITOR) tablet 40 mg (has no administration in time range)  bisacodyl (DULCOLAX) EC tablet 5 mg (has no administration in time range)  ferrous sulfate tablet 325 mg (has no administration in time range)  metoprolol tartrate (LOPRESSOR) tablet 50 mg (has no administration in time range)  pantoprazole (PROTONIX) EC tablet 40 mg (has no administration in time range)  Propylene Glycol 0.6 % SOLN 1 drop (has no administration in time range)  Sennosides TABS 15-30 mg (has no administration in time range)  fesoterodine (TOVIAZ) tablet 4 mg (has no administration in time range)  verapamil (CALAN-SR) CR tablet 240 mg (has no administration in time range)  sodium chloride 0.9 % bolus 500 mL (0 mLs Intravenous Stopped 07/27/18 0930)     Initial Impression / Assessment and Plan / ED Course  I have reviewed the triage vital signs and the nursing  notes.  Pertinent labs & imaging results that were available during my care of the patient were reviewed by me and considered in my medical decision making (see chart for details).  Clinical Course as of Jul 26 1732  Fri Jul 27, 2018  0738 Called patient's daughter Brenda [name] who she lives with.  She said the patient has had back surgery 1 year ago and since then has been weak and more prone to fall.  She was just in the doctor's office a few days ago for a skin cancer surgery on her wrist.  She uses a walker at baseline.  She has had 3 ground-level falls in the past 2 days.  She said the last 2 times they were able to get her up but today she was just dead weight and would not try to get up.  Otherwise she denies any recent illness symptoms for her.   [MB]  7821 85 year old female on anticoagulation here after 3 falls in the last few days.  Differential diagnosis includes head bleed, pelvic fracture, anemia, metabolic derangement, deconditioning, UTI.   [MB]  1102 Patient's lab work shows an elevated WBC but no other significant abnormalities.  Her hemoglobin is about baseline.  Creatinine is also slightly up but at baseline.  Urinalysis does not show any signs of infection.  Will ambulate here and see if it is safe to discharge home.   [MB]  5277 Nurse tried to get the patient up but she was having too much pain in her back and her pelvis to even attempt to stand much less walk.  We will put her in for a CT  of her L-spine and pelvis.   [MB]  1607 Reviewed imaging with orthopedic PA Jeffries who said that there would not be any operative intervention.  Patient can be weightbearing as tolerated.  Have placed a call into social work regarding potential rehab candidate.   [MB]    Clinical Course User Index [MB] Hayden Rasmussen, MD        Final Clinical Impressions(s) / ED Diagnoses   Final diagnoses:  Contusion of buttock, initial encounter  Frequent falls    ED Discharge Orders    None        Hayden Rasmussen, MD 07/27/18 1736

## 2018-07-27 NOTE — Progress Notes (Addendum)
CSW called pt's listed cell phone number and reached pt's daughter Viveca, Beckstrom at ph: [phone] [facility]) and was told this was the pt's home phone number and that the pt has a cell phone but that it was left at the pt's home.  Pt's daughter did not seem interested in pt's disposition but seemed more upset at "losing a whole day of work over this".  CXW called pt's RN Elmyra Ricks via [facility] ED switchboard and spoke to pt via the room phone.    CSW per pt, she had two falls this week and pt did not come to hospital for the first fall.  Per the pt she had the second fall today.  Per the pt she could not hear the CSW over the phone.  CSW attempted to call pt's RN Elmyra Ricks at [phone] but the phone was busy.  CSW will attempt to call RN again.  CSW will continue to follow for D/C needs.  Alphonse Guild. Chinmayi Rumer, LCSW, LCAS, CSI Transitions of Care Clinical Social Worker Care Coordination Department Ph: [phone]

## 2018-07-27 NOTE — Progress Notes (Signed)
CSW reviewed chart and notes pt was discharged.  CSW called pt's EDP and EPD who will place a consult forTOC RN CM with an order for face-to-face and [facility] services for RN/Aide/PT/OT and Social Work.  EDP placed thew above.  Please reconsult if future social work needs arise.  CSW signing off, as social work intervention is no longer needed.  Alphonse Guild. Ryland Tungate, LCSW, LCAS, CSI Transitions of Care Clinical Social Worker Care Coordination Department Ph: [phone]

## 2018-07-27 NOTE — Progress Notes (Signed)
CSW called pt's daughter Brylyn, Novakovich at ph: [phone] and CSW explained pt's options.  Pt's daughter stated that if it were not for COVID-19 the pt's daughter would be agreeable to SNF placement or attempting SNF placement from the ED or home but at this time pt's daughter would like to have pt D/C home where pt lives with her adult and adult daughter to attempt PT/OT through [facility] services.  Per pt's daughter pt has utilized [facility] services for PT/OT in the past.  Per pt's daughter pt has multiple walkers and a wheelchair already in the home should pt want to D/C home.  Per pt's daughter one year ago today pt's husband was admitted into the hospital and that pt's husband had passed away before [facility] and that that memory coupled with today's events has been traumatic for the pt/pt's family and they wish for this reason as well as due to fears of possible COVID exposure to assist the pt at home after D/C.  Pt's daughter states she will call the pt's RN's phone and discuss this with the pt.  CSW will continue to follow for D/C needs.  Alphonse Guild. Shandrika Ambers, LCSW, LCAS, CSI Transitions of Care Clinical Social Worker Care Coordination Department Ph: [phone]

## 2018-07-27 NOTE — ED Notes (Signed)
Patient is crying and states that she does not want to go to a nursing home, nor does she want to lose her home. Her daughter called and stated that she would come and pick her up and case mgmt will arrange for a [facility] consult for PT

## 2018-07-27 NOTE — ED Notes (Signed)
Attempted to ambulate patient. Pt unable to stand at side of the bed without maximum assistance.

## 2018-07-27 NOTE — ED Triage Notes (Signed)
Pt BIB [facility]. Pt had a ground level fall this morning while ambulating from the bathroom. Per EMS patient has had about 3 falls in the past two days. Pt has a history of a fractured pelvis about 2 years ago. Pt lives at home with family and typically uses a walker to ambulate. Pt is alert and oriented to person, place, and time. No loss consciousness per EMS. Pt complaining of tailbone pain at present time.

## 2018-07-27 NOTE — Progress Notes (Signed)
CSW received consult for patient due to her frequent falls and inability to ambulate with the possibility of admission to a facility for rehab. CSW reviewed chart, patient came to [facility] ED from home this morning via EMS. CSW continue following patient and will complete assessment.    Madilyn Fireman, MSW, [place] Clinical Social Worker Emergency Department [employer] [phone]

## 2018-07-27 NOTE — ED Notes (Signed)
Patient transported to X-ray 

## 2018-07-27 NOTE — Progress Notes (Signed)
CSW reviewed chart and is responding to consult which was received with questions about placement being an option.  CSW spoke to EDP who is awaiting more collateral info from CSW.  CSW will continue to follow for D/C needs.  Alphonse Guild. Marvia Troost, LCSW, LCAS, CSI Transitions of Care Clinical Social Worker Care Coordination Department Ph: [phone]

## 2018-07-28 NOTE — TOC Initial Note (Signed)
Transition of Care [facility]) - Initial/Assessment Note    Patient Details  Name: Brenda [name] MRN: [id number] Date of Birth: 09/18/1931  Transition of Care [facility]) CM/SW Contact:    Erenest Rasher, RN Phone Number: 07/28/2018, 3:27 PM  Clinical Narrative:                  ED discharge on 07/27/2018 with [facility] needs. Contacted via phone pt and gave permission to speak to son or dtr. States she prefers to be at home and declines SNF. Spoke to son, states she had [facility] in the past. She has RW, cane and wheelchair at home. Contacted dtr, Dee and states pt does have Long Term Care/Short Term Care policies. States they plan on hiring a private duty aide to assist at home as needed. Offered choice for [facility] Dtr requested [place]. Contacted rep with new referral.   Expected Discharge Plan: [place] Barriers to Discharge: No Barriers Identified   Patient Goals and CMS Choice Patient states their goals for this hospitalization and ongoing recovery are:: want to stay home CMS Medicare.gov Compare Post Acute Care list provided to:: Patient Choice offered to / list presented to : Adult Children  Expected Discharge Plan and Services Expected Discharge Plan: [place] In-house Referral: Clinical Social Work Discharge Planning Services: CM Consult Post Acute Care Choice: [place] arrangements for the past 2 months: Single Family Home                 DME Arranged: N/A DME Agency: NA       HH Arranged: RN, PT, OT, Nurse's Aide, Social Work, Refused SNF [place] Agency: [place] ([place]) Date Campus: 07/28/18 Time [place]: 1526 Representative spoke with at [place]: Floydene Flock  Prior Living Arrangements/Services Living arrangements for the past 2 months: [place] with:: Adult Children   Do you feel safe going back to the place where you live?: Yes      Need for Family Participation  in Patient Care: Yes (Comment) Care giver support system in place?: Yes (comment) Current home services: DME(rolling walker, cane, wheelchair) Criminal Activity/Legal Involvement Pertinent to Current Situation/Hospitalization: No - Comment as needed  Activities of Daily Living      Permission Sought/Granted Permission sought to share information with : Case Manager, PCP, Family Supports Permission granted to share information with : Yes, Verbal Permission Granted  Share Information with NAME: Allen Kell, [name] Dorena Bodo  Permission granted to share info w AGENCY: [place] granted to share info w Relationship: son, daughter  Permission granted to share info w Contact Information: [phone]  Emotional Assessment       Orientation: : Oriented to Self, Oriented to Place, Oriented to  Time, Oriented to Situation   Psych Involvement: No (comment)  Admission diagnosis:  Fall; on thinners Patient Active Problem List   Diagnosis Date Noted  . Black stool 07/19/2018  . Constipation 07/16/2018  . Melena 07/16/2018  . Lichen sclerosus 40/81/4481  . Grief reaction 01/19/2018  . Vaginal irritation 10/23/2017  . Acute pain of left shoulder 07/27/2017  . GERD (gastroesophageal reflux disease) 07/18/2017  . Gastrointestinal hemorrhage   . Symptomatic anemia 07/17/2017  . Iron deficiency anemia 05/09/2017  . Osteoporosis 11/24/2016  . Skin cancer of nose 11/23/2016  . 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 UTI (urinary tract infection) 10/26/2016  . Paroxysmal A-fib ([place]) 08/05/2016  . AKI (  acute kidney injury) ([place]) 05/25/2016  . Essential hypertension 05/05/2016  . Type 2 diabetes mellitus without complication, without long-term current use of insulin ([place]) 05/05/2016  . Hiatal hernia 05/05/2016  . History of CVA (cerebrovascular accident) without residual deficits 05/05/2016  . Memory change 05/05/2016  . AICD (automatic  cardioverter/defibrillator) present 05/05/2016  . Back pain 05/05/2016   PCP:  Sela Hilding, MD Pharmacy:   [facility] [place], [place] - [address] [place] [address] Phone: [phone] Fax: [phone]  [facility] [place], [place] [address] [place] [address] Phone: [phone] Fax: [phone]     Social Determinants of Health (SDOH) Interventions    Readmission Risk Interventions No flowsheet data found.

## 2018-08-01 ENCOUNTER — Telehealth: Payer: Self-pay | Admitting: Family Medicine

## 2018-08-01 ENCOUNTER — Telehealth: Payer: Self-pay | Admitting: Cardiology

## 2018-08-01 ENCOUNTER — Telehealth: Payer: Self-pay | Admitting: *Deleted

## 2018-08-01 NOTE — Telephone Encounter (Signed)
Advised HHRN, Brenda [name], to call PCP for orders as Dr. Curt Bears is not following her contusion or recent hospital visit.  He only follows her ICD and AFib. Malachy Mood verbalized understanding and will reach out to primary MD to obtain orders needed for pt.

## 2018-08-01 NOTE — Telephone Encounter (Signed)
New Message    Brenda [name] is calling for verbal orders for home health for 2x week for 3 weeks.  Also she is needed a prescription for a hospital bed and a bedside commode   [phone]

## 2018-08-01 NOTE — Telephone Encounter (Signed)
Cheryl from [facility] calling for RN verbal orders as follows:  2 time(s) weekly for 2 week(s), then 1 time(s) weekly for 1 week(s) for hip pain, gait due to hip pain and her sore on her back ( from fall)  She will apply foam dressing to that.   You can leave verbal orders on confidential voicemail.  Christen Bame, CMA

## 2018-08-01 NOTE — Telephone Encounter (Signed)
St[facility] with [facility] is calling and would like verbal orders. PT 2 x week 3 weeks  Erline Levine would also like to have a prescription for a bedside commode and hospital bed faxed to [phone].  The best call back number is [phone]

## 2018-08-01 NOTE — Telephone Encounter (Signed)
Brenda [name], called from [facility] about an order, for home health 2 wk one, 3wk two.  For nursing 2 wk one and 1 wk one.   Patient has a sore on her back, she believes it happened when she was in the hospital, they are going to keep a foam dressing over it to keep it protected.  She also needs a bedside commode. Brenda [name] has order her one.

## 2018-08-01 NOTE — Telephone Encounter (Signed)
Left message informing Stacy to contact pt's PCP for [facility] orders. Advised to call back if she had further questions.

## 2018-08-02 NOTE — Telephone Encounter (Signed)
Ok to give verbal order under me.

## 2018-08-02 NOTE — Telephone Encounter (Signed)
Verbal orders given to [facility] with [facility]. Dr. Lindell Noe faxing Rx for bedside commode and hospital bed. Ottis Stain, CMA

## 2018-08-02 NOTE — Telephone Encounter (Signed)
Verbals given  

## 2018-08-02 NOTE — Telephone Encounter (Signed)
Ok to give verbal order.

## 2018-08-03 NOTE — Telephone Encounter (Signed)
DME orders written and placed in fax box.

## 2018-08-07 ENCOUNTER — Telehealth: Payer: Self-pay

## 2018-08-07 NOTE — Telephone Encounter (Signed)
[facility] OT calling for verbal orders as follows:  2 time(s) weekly for 3 week(s)   You can leave verbal orders on confidential voicemail.  [phone]

## 2018-08-08 ENCOUNTER — Telehealth (INDEPENDENT_AMBULATORY_CARE_PROVIDER_SITE_OTHER): Payer: Medicare Other | Admitting: Family Medicine

## 2018-08-08 ENCOUNTER — Other Ambulatory Visit: Payer: Self-pay

## 2018-08-08 DIAGNOSIS — F4321 Adjustment disorder with depressed mood: Secondary | ICD-10-CM | POA: Diagnosis not present

## 2018-08-08 DIAGNOSIS — R413 Other amnesia: Secondary | ICD-10-CM

## 2018-08-08 DIAGNOSIS — R296 Repeated falls: Secondary | ICD-10-CM

## 2018-08-08 NOTE — Telephone Encounter (Signed)
Ok to give verbal under me

## 2018-08-08 NOTE — Progress Notes (Addendum)
[place] Telemedicine Visit  Patient consented to have virtual visit. Method of visit: Video  Encounter participants: Patient: Brenda [name] - located at home with her daughter Provider: Ralene Ok - located at [facility] Others (if applicable): daughter   Chief Complaint: fell   HPI:  [place] on 4/29 after leaving the skin doctor for procedure. [place] again last Thursday, Friday. They are receiving HHPT and OT. Daughter was off last week and caring for her, but has to go back this week. PT is coming three times per week. She is using adult diapers. Is having bowel movements three or four times per day. She doesn't feel herself pooping. She does feel when she pees.   She also had a headache on the R side. Daughter initially thought it was in the last two days, but when we asked patient several times she couldn't recall what we were talking about and denied pain at the time. Daughter thought she had said her vision was blurry, but patient says she doesn't know what daughter was talking about and could see as normal today. They didn't try tylenol for the headache.   Her son is home w her during the day and gets her breakfast and helps her brush her teeth.   Her daughter manages all of her medications.   They think her ICD may have deployed on 5/2. Her children cannot get her back into the bed on their own.   They have not been using metamucil or miralax. Currently she is taking iron pills every MWF. Not taking the new stool softener from GI.   The first PT episode she couldn't move because she was sore. W PT yesterday they stood with walker and walked.    Told daughter that she wanted a pill to go to sleep and stay asleep. Used to take zoloft when they lived in [place] but daughter didn't think that helped. No plan today. No feelings of this today. Sad due to loss of her husband and anniversary of his death this month.   ROS: per HPI  Pertinent PMHx:  paroxysymal Afib, frequent falls, constipation  Exam:  Respiratory: breathing comfortably on video.   Assessment/Plan:  ?ICD firing - messaged Dr. Macky Lower team to see if they would like to interrogate her ICD or have a virtual visit to discuss this.   Frequent BM s- continue to monitor, avoid miralax and metamucil. Continue iron.  They had not started the senna that her GI doctor had recommended, encouraged him not to do this currently.  I am hesitant to use any GI motility slowing agents as I have would worry about constipation since this has been in her history.  Call if worsening.  Grief - they will call [facility] to talk about virtual counseling visits as the upcoming anniversary of her husband's death is weighing on her. Would also consider remeron as the next step if patient and family desires.  Some passive suicidal ideation recently.  Falls - continue home health PT/OT. Due to chronic sacral insufficiency fracture, patient requires frequent changes in body position and has an immediate need for change in body position. The patient has a medical condition requiring positioning of hte body in ways not feasible with an ordinary bed to alleviate pain.  I have written orders for bedside commode as well as a hospital bed.  Faxed further clarifying documentation today.  HA - unclear etiology. Patient has dementia and cannot further elaborate on hx or additional sxs despite several  questions. Daughter isn't sure whether she had vision changes or not. Had recent head CT in the ED which is somewhat reassuring. Use APAP PRN for headache, if no improvement or worsening or additional symptoms, call or go to ED.   Time spent during visit with patient: 21 minutes

## 2018-08-08 NOTE — Telephone Encounter (Signed)
Verbals given on Bruce's confidential VM.

## 2018-08-09 ENCOUNTER — Ambulatory Visit (INDEPENDENT_AMBULATORY_CARE_PROVIDER_SITE_OTHER): Payer: Medicare Other | Admitting: *Deleted

## 2018-08-09 ENCOUNTER — Telehealth: Payer: Self-pay | Admitting: Family Medicine

## 2018-08-09 ENCOUNTER — Other Ambulatory Visit: Payer: Self-pay

## 2018-08-09 ENCOUNTER — Ambulatory Visit: Payer: Medicare Other

## 2018-08-09 ENCOUNTER — Telehealth: Payer: Self-pay | Admitting: *Deleted

## 2018-08-09 DIAGNOSIS — I472 Ventricular tachycardia, unspecified: Secondary | ICD-10-CM

## 2018-08-09 LAB — CUP PACEART REMOTE DEVICE CHECK
Battery Remaining Longevity: 36 mo
Battery Remaining Percentage: 41 %
Brady Statistic RA Percent Paced: 69 %
Brady Statistic RV Percent Paced: 0 %
Date Time Interrogation Session: 20200514161500
HighPow Impedance: 49 Ohm
Implantable Lead Implant Date: 20111110
Implantable Lead Implant Date: 20111110
Implantable Lead Location: 753859
Implantable Lead Location: 753860
Implantable Lead Model: 157
Implantable Lead Model: 4135
Implantable Lead Serial Number: 28803817
Implantable Lead Serial Number: 302861
Implantable Pulse Generator Implant Date: 20111110
Lead Channel Impedance Value: 441 Ohm
Lead Channel Impedance Value: 696 Ohm
Lead Channel Setting Pacing Amplitude: 2 V
Lead Channel Setting Pacing Amplitude: 2.4 V
Lead Channel Setting Pacing Pulse Width: 0.4 ms
Lead Channel Setting Sensing Sensitivity: 0.6 mV
Pulse Gen Serial Number: 169112

## 2018-08-09 NOTE — Telephone Encounter (Signed)
Transmission received. No treated episodes. 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 Full report awaiting processing by [facility].  Spoke with son. He is aware of transmission results (no shocks). He reports pt had an episode of chest pain last night and this is why they thought she was possibly shocked. Unsure of time or duration. No episodes to correlate on transmission. Advised I will route to Dr. Curt Bears and Venida Jarvis, RN, to determine if any further recommendations. Pt's son is agreeable and thanked me for my assistance.

## 2018-08-09 NOTE — Telephone Encounter (Signed)
Spoke with patient's son to request transmission. Pt is actually scheduled for 91 day remote today, but it has not yet been received. Pt's son reports he just hit the button prior to my call. Pt is sitting within 9ft of monitor. Advised I will call back in about 54min to confirm receipt. He is agreeable.

## 2018-08-09 NOTE — Telephone Encounter (Signed)
Spoke with pt's son. Attempted to troubleshoot monitor. Monitor is ~3ft from pt. Advised troubleshooting directions recommend moving it within 52ft of pt. Pt is currently bed-bound due to recent falls. Pt's son has a surge protector extension cord and will try to setup monitor closer. Will also contact [employer] if additional troubleshooting necessary.

## 2018-08-09 NOTE — Telephone Encounter (Signed)
Attempted to call pt's son back--transmission has still not been received. No answer, phone rang continuously so unable to LM.

## 2018-08-09 NOTE — Telephone Encounter (Signed)
-----   Message from Stanton Kidney, RN sent at 08/09/2018  7:59 AM EDT ----- Regarding: possible ICD therapy? Forwarding to device clinic about transmission. ----- Message ----- From: Sela Hilding, MD Sent: 08/08/2018   4:02 PM EDT To: Stanton Kidney, RN  Hi Sherri,  I am this patient's PCP. She sees Dr. Curt Bears for Afib and ICD. They report that they think her ICD may have fired the other day. She was on the video visit today doing as well as she can (recent falls was her reason to be seen), but I told her and her daughter that I would message to see if Dr. Curt Bears would like to remotely interrogate her ICD or have a virtual visit with her to check on this. Let me know what y'all think.  Thanks! Ralene Ok

## 2018-08-09 NOTE — Telephone Encounter (Signed)
Erline Levine from [facility] called about bedside commode for pt. Please give a call back. Fax number is [phone].

## 2018-08-10 MED ORDER — METOPROLOL TARTRATE 100 MG PO TABS: 100.0000 mg | ORAL_TABLET | Freq: Two times a day (BID) | ORAL | 2 refills | Status: DC

## 2018-08-10 NOTE — Telephone Encounter (Signed)
Spoke to pt and her son. Advised to increase Lopressor to 100 mg BID per Dr. Curt Bears Rx sent to mail order pharmacy per request Advised to call if issues after increase. Son agreeable to plan

## 2018-08-10 NOTE — Telephone Encounter (Signed)
Spoke to [place]. She will double check to see if the order has made it to the right person. If not, Erline Levine will call us and ask [place] to refax the forms. Ottis Stain, CMA

## 2018-08-10 NOTE — Telephone Encounter (Signed)
Can anyone clarify what she needs? I filled out and faxed the forms for the bedside commode back to them on Weds.

## 2018-08-15 ENCOUNTER — Telehealth: Payer: Self-pay | Admitting: Family Medicine

## 2018-08-15 ENCOUNTER — Encounter: Payer: Self-pay | Admitting: Cardiology

## 2018-08-15 NOTE — Progress Notes (Signed)
Remote ICD transmission.   

## 2018-08-15 NOTE — Telephone Encounter (Signed)
Rn team,  We are having trouble getting this patient her bedside commode and hospital bed. I have received multiple requests from [employer] to have someone who is PECOS enrolled (an attending) sign these orders. When I faxed a paper script 2 weeks ago for these two items, this was not adequate. They needed a detailed form order, which I filled out last weds. Thus, I called Erline Levine this morning and asked her to refax the paper form that I filled out last week so that we can have an attending cosign it. I had to leave a VM and left our number if she had questions. Routing in case she calls today or you get the fax.

## 2018-08-17 NOTE — Telephone Encounter (Signed)
Called [name] back, we discussed that the hangup seems to be with [employer] and their lack of response to my call on Wednesday and requesting the follow up paperwork. Brenda [name] is going to follow up with her supervisor. To contact [employer], we should dial [phone] ext [phone]. I have called them again, and after waiting on hold for some time, they stated they can't tell what paperwork is missing and will send a message to another department to see what is needed. They will call the RN voicemail and let us know what is needed.

## 2018-08-17 NOTE — Telephone Encounter (Signed)
[name] - a RN with [facility] - called checking the status of this request for bedside commode and hospital bed. [employer] told [name] that they canceled the order/request since they never received the froms back signed. Read off [place] message to [facility] regarding this and said the forms have been sent multiple times but they just seem to be asking for more information/corrections each time. [name]'s number is [phone] if you have any questions, but I did tell her that Brenda [name] has been trying to get this taken care of.

## 2018-08-17 NOTE — Telephone Encounter (Signed)
Spoke to the patient today and she stated she has fallen two times and can not get out to come in for injection-patient will call us when she is feeling better to schedule nurse visit to get Prolia- she owes $0 and can get it at any time

## 2018-08-21 ENCOUNTER — Telehealth: Payer: Self-pay | Admitting: *Deleted

## 2018-08-21 NOTE — Telephone Encounter (Signed)
Verbal order given to [facility] per Dr. Lindell Noe. Sussie Minor Zimmerman Rumple, CMA

## 2018-08-21 NOTE — Telephone Encounter (Signed)
[place] team, please leave verbal orders for such.

## 2018-08-21 NOTE — Telephone Encounter (Signed)
Stacy from [facility] calling for PT verbal orders as follows:  2 time(s) weekly for 2 week(s)  You can leave verbal orders on confidential voicemail.  Elinore Shults, CMA  

## 2018-08-22 NOTE — Telephone Encounter (Signed)
T[place]ania from [facility] left 2 messages on RN line.  1st call - to ask for a PECOS certified provider.  2nd call - was to inform us that the "patient is not longer interested in hospital bed or commode".   Will forward to MD. Christen Bame, CMA

## 2018-08-23 NOTE — Telephone Encounter (Signed)
Ok, I am happy to get an attending to sign off on these if they change their mind.

## 2018-09-05 ENCOUNTER — Telehealth: Payer: Self-pay

## 2018-09-05 NOTE — Telephone Encounter (Signed)
[place], with [place], called nurse line stating the patients bedside commode order needs to be refaxed with an attending signature, in order for her to receive. I didn't see where one has been ordered recently?   Fax number: [phone]

## 2018-09-05 NOTE — Telephone Encounter (Signed)
I would need another form to do this. They have a long form w lots of specifications for the bedside commode, which I filled out previously. I have asked them in a VM before to fax another form, as the one I completed is in batch scanning and has not been scanned yet. They did not send as far as I know. I tried to call the listed number back and did not get a real person. Could someone else try to call them as well and ask them to fax [place] a new form please? I will happily get an attending to sign off on this.

## 2018-09-06 NOTE — Telephone Encounter (Signed)
[place] and she is resending form to be completed. Please keep an eye out for this.  Please be sure to have PECOS certified doctor to sign form once completed. Cully Luckow Zimmerman Rumple, CMA

## 2018-09-07 ENCOUNTER — Other Ambulatory Visit: Payer: Self-pay

## 2018-09-07 ENCOUNTER — Emergency Department (HOSPITAL_COMMUNITY)
Admission: EM | Admit: 2018-09-07 | Discharge: 2018-09-07 | Disposition: A | Discharge status: Home / Self Care | Payer: Medicare Other | Attending: Emergency Medicine | Admitting: Emergency Medicine

## 2018-09-07 ENCOUNTER — Emergency Department (HOSPITAL_COMMUNITY): Payer: Medicare Other

## 2018-09-07 ENCOUNTER — Encounter (HOSPITAL_COMMUNITY): Payer: Self-pay

## 2018-09-07 DIAGNOSIS — Y999 Unspecified external cause status: Secondary | ICD-10-CM | POA: Insufficient documentation

## 2018-09-07 DIAGNOSIS — S40011A Contusion of right shoulder, initial encounter: Secondary | ICD-10-CM | POA: Diagnosis not present

## 2018-09-07 DIAGNOSIS — I10 Essential (primary) hypertension: Secondary | ICD-10-CM | POA: Insufficient documentation

## 2018-09-07 DIAGNOSIS — Z85828 Personal history of other malignant neoplasm of skin: Secondary | ICD-10-CM | POA: Insufficient documentation

## 2018-09-07 DIAGNOSIS — W1830XA Fall on same level, unspecified, initial encounter: Secondary | ICD-10-CM | POA: Insufficient documentation

## 2018-09-07 DIAGNOSIS — Y939 Activity, unspecified: Secondary | ICD-10-CM | POA: Insufficient documentation

## 2018-09-07 DIAGNOSIS — Y929 Unspecified place or not applicable: Secondary | ICD-10-CM | POA: Diagnosis not present

## 2018-09-07 DIAGNOSIS — Z79899 Other long term (current) drug therapy: Secondary | ICD-10-CM | POA: Insufficient documentation

## 2018-09-07 DIAGNOSIS — S7001XA Contusion of right hip, initial encounter: Secondary | ICD-10-CM | POA: Diagnosis not present

## 2018-09-07 DIAGNOSIS — R296 Repeated falls: Secondary | ICD-10-CM | POA: Insufficient documentation

## 2018-09-07 DIAGNOSIS — Z7901 Long term (current) use of anticoagulants: Secondary | ICD-10-CM | POA: Diagnosis not present

## 2018-09-07 DIAGNOSIS — W19XXXA Unspecified fall, initial encounter: Secondary | ICD-10-CM

## 2018-09-07 DIAGNOSIS — M25511 Pain in right shoulder: Secondary | ICD-10-CM | POA: Diagnosis present

## 2018-09-07 LAB — URINALYSIS, ROUTINE W REFLEX MICROSCOPIC
Bilirubin Urine: NEGATIVE
Glucose, UA: NEGATIVE mg/dL
Hgb urine dipstick: NEGATIVE
Ketones, ur: NEGATIVE mg/dL
Leukocytes,Ua: NEGATIVE
Nitrite: NEGATIVE
Protein, ur: NEGATIVE mg/dL
Specific Gravity, Urine: 1.011 (ref 1.005–1.030)
pH: 5 (ref 5.0–8.0)

## 2018-09-07 NOTE — ED Triage Notes (Signed)
Pt from home via ems; mechanical fall 2 days ago, injured R shoulder; mechanical fall today, c/o R shoulder pain; bruise on R hip; pt on xarelto; denies hitting head, denies loc; pt has hx dementia, hip fracture, afib, pt has defibrillator; alert and oriented with ems  136/66 P 94 RR 18 94% RA CBG 214 T 98.6

## 2018-09-07 NOTE — Discharge Instructions (Signed)
There were no serious injuries found from the fall.  The defibrillator is functioning normally.  Her pain should gradually improve.  Treat the pain with Tylenol, 650 mg every 4 hours as needed.  Follow-up with her primary care doctor for checkup in 1 to 2 weeks and as needed for problems.

## 2018-09-07 NOTE — ED Notes (Signed)
Spoke with pt's Daughter, [name], regarding pt's stauts and discharge instructions

## 2018-09-07 NOTE — ED Notes (Signed)
Patient transported to X-ray 

## 2018-09-07 NOTE — ED Notes (Signed)
Patient verbalizes understanding of discharge instructions. Opportunity for questioning and answers were provided. Pt discharged from ED. 

## 2018-09-07 NOTE — Telephone Encounter (Signed)
Had [place] sign and placed in the fax box.

## 2018-09-07 NOTE — ED Provider Notes (Signed)
[facility] EMERGENCY DEPARTMENT Provider Note   CSN: [id number] Arrival date & time: 09/07/18  1247    History   Chief Complaint Chief Complaint  Patient presents with  . Fall    HPI Brenda [name] is a 85 y.o. female.     HPI   She complains of right shoulder pain, injured in a fall 2 days ago then possibly reinjured today and another fall.  She also was told by her daughter that she has a bruise on her right hip.  Patient is a moderately poor historian.  Level 5 caveat-poor historian    Past Medical History:  Diagnosis Date  . Anemia   . Arthritis   . Basal cell carcinoma    nose  . Diabetes mellitus without complication ([place])    pt denies diabetes  . GERD (gastroesophageal reflux disease)   . Hiatal hernia   . HLD (hyperlipidemia)   . Hypertension   . Seizure [facility]) 2011    Patient Active Problem List   Diagnosis Date Noted  . Black stool 07/19/2018  . Constipation 07/16/2018  . Melena 07/16/2018  . Lichen sclerosus 40/98/1191  . Grief reaction 01/19/2018  . Vaginal irritation 10/23/2017  . Acute pain of left shoulder 07/27/2017  . GERD (gastroesophageal reflux disease) 07/18/2017  . Gastrointestinal hemorrhage   . Symptomatic anemia 07/17/2017  . Iron deficiency anemia 05/09/2017  . Osteoporosis 11/24/2016  . Skin cancer of nose 11/23/2016  . 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 UTI (urinary tract infection) 10/26/2016  . Paroxysmal A-fib ([place]) 08/05/2016  . AKI (acute kidney injury) ([place]) 05/25/2016  . Essential hypertension 05/05/2016  . Type 2 diabetes mellitus without complication, without long-term current use of insulin ([place]) 05/05/2016  . Hiatal hernia 05/05/2016  . History of CVA (cerebrovascular accident) without residual deficits 05/05/2016  . Memory change 05/05/2016  . AICD (automatic cardioverter/defibrillator) present 05/05/2016  . Back pain 05/05/2016    Past Surgical History:  Procedure Laterality Date  .  CARDIAC DEFIBRILLATOR PLACEMENT    . CATARACT EXTRACTION Left 02/2001  . CHOLECYSTECTOMY    . COLONOSCOPY WITH PROPOFOL N/A 07/19/2017   Procedure: COLONOSCOPY WITH PROPOFOL;  Surgeon: Jerene Bears, MD;  Location: [facility];  Service: Gastroenterology;  Laterality: N/A;  . ESOPHAGOGASTRODUODENOSCOPY (EGD) WITH PROPOFOL N/A 07/19/2017   Procedure: ESOPHAGOGASTRODUODENOSCOPY (EGD) WITH PROPOFOL;  Surgeon: Jerene Bears, MD;  Location: [facility] ENDOSCOPY;  Service: Gastroenterology;  Laterality: N/A;  . LUMBAR LAMINECTOMY  06/11/2001  . LUMBAR LAMINECTOMY  02/09/2006  . LUMBAR LAMINECTOMY  07/07/2006   with spianal cord stimulator  . SPINAL CORD STIMULATOR REMOVAL  14/2009  . TUBAL LIGATION       OB History   No obstetric history on file.      Home Medications    Prior to Admission medications   Medication Sig Start Date End Date Taking? Authorizing Provider  acetaminophen (TYLENOL) 500 MG tablet Take 500-1,000 mg by mouth every 6 (six) hours as needed for mild pain.    [provider]  atorvastatin (LIPITOR) 40 MG tablet Take 1 tablet (40 mg total) by mouth daily. 04/23/18   Camnitz, Ocie Doyne, MD  bisacodyl (DULCOLAX) 5 MG EC tablet Take 1 tablet (5 mg total) by mouth daily as needed for moderate constipation. 07/24/18   Armbruster, Carlota Raspberry, MD  Cholecalciferol (VITAMIN D-3) 1000 units CAPS Take 1,000 Units by mouth daily.    [provider]  clobetasol ointment (TEMOVATE) 4.78 % Apply 1 application topically  2 (two) times daily. Patient not taking: Reported on 07/27/2018 01/17/18   Sela Hilding, MD  ferrous sulfate 325 (65 FE) MG tablet Take 325 mg by mouth 3 (three) times a week. Monday, Wed, Fri.    [provider]  methylcellulose oral powder Take by mouth daily as needed.    [provider]  metoprolol tartrate (LOPRESSOR) 100 MG tablet Take 1 tablet (100 mg total) by mouth 2 (two) times daily. 08/10/18 11/08/18  Camnitz, Ocie Doyne, MD   Multiple Vitamins-Minerals (ICAPS AREDS 2 PO) Take 1 capsule by mouth 2 (two) times daily.     [provider]  omeprazole (PRILOSEC) 40 MG capsule Take 1 capsule (40 mg total) by mouth 2 (two) times daily. Take before breakfast and dinner. 07/24/18   Armbruster, Carlota Raspberry, MD  Propylene Glycol (SYSTANE COMPLETE) 0.6 % SOLN Place 1 drop into both eyes daily.    [provider]  Rivaroxaban (XARELTO) 15 MG TABS tablet Take 1 tablet (15 mg total) by mouth daily with supper. 04/20/18   Camnitz, Will Hassell Done, MD  Sennosides 15 MG TABS Take 1-2 tablets (15-30 mg total) by mouth daily. Patient not taking: Reported on 07/27/2018 07/24/18   Yetta Flock, MD  sucralfate (CARAFATE) 1 GM/10ML suspension Take 10 mLs (1 g total) by mouth every 6 (six) hours as needed. Patient not taking: Reported on 07/27/2018 07/24/18   Yetta Flock, MD  tolterodine (DETROL LA) 2 MG 24 hr capsule TAKE 1 CAPSULE(2 MG) BY MOUTH DAILY 04/19/18   Sela Hilding, MD  verapamil (CALAN-SR) 240 MG CR tablet Take 1 tablet (240 mg total) by mouth at bedtime. 04/23/18   Camnitz, Ocie Doyne, MD    Family History Family History  Problem Relation Age of Onset  . Diabetes Mother   . Heart failure Father   . Breast cancer Sister   . Heart failure Sister   . Diabetes Son   . Diabetes Sister   . Colon cancer Neg Hx   . Stomach cancer Neg Hx   . Rectal cancer Neg Hx   . Esophageal cancer Neg Hx     Social History Social History   Tobacco Use  . Smoking status: Never Smoker  . Smokeless tobacco: Never Used  Substance Use Topics  . Alcohol use: No  . Drug use: No     Allergies   Sotalol, Erythromycin, Augmentin [amoxicillin-pot clavulanate], Crestor [rosuvastatin calcium], Keflex [cephalexin], and Septra [sulfamethoxazole-trimethoprim]   Review of Systems Review of Systems  Unable to perform ROS: Mental status change     Physical Exam Updated Vital Signs BP 135/61   Pulse 76   Temp  97.8 F (36.6 C) (Oral)   Resp 17   Ht 5\' 1"  (1.549 m)   Wt 59.4 kg   SpO2 100%   BMI 24.75 kg/m   Physical Exam Vitals signs and nursing note reviewed.  Constitutional:      General: She is not in acute distress.    Appearance: Normal appearance. She is well-developed. She is not ill-appearing, toxic-appearing or diaphoretic.     Comments: Elderly, frail  HENT:     Head: Normocephalic and atraumatic.     Right Ear: External ear normal.     Left Ear: External ear normal.     Mouth/Throat:     Mouth: Mucous membranes are moist.     Pharynx: No oropharyngeal exudate or posterior oropharyngeal erythema.  Eyes:     Conjunctiva/sclera: Conjunctivae normal.  Pupils: Pupils are equal, round, and reactive to light.  Neck:     Musculoskeletal: Normal range of motion and neck supple.     Trachea: Phonation normal.  Cardiovascular:     Rate and Rhythm: Normal rate and regular rhythm.     Heart sounds: Normal heart sounds.  Pulmonary:     Effort: Pulmonary effort is normal.     Breath sounds: Normal breath sounds.  Chest:     Chest wall: No tenderness.  Abdominal:     Palpations: Abdomen is soft.     Tenderness: There is no abdominal tenderness.  Musculoskeletal:     Comments: Right shoulder mildly tender without deformity.  Shoulder active and passive range of motion of the right shoulder.  Normal range of motion legs, bilaterally.  Skin:    General: Skin is warm and dry.  Neurological:     Mental Status: She is alert.     Cranial Nerves: No cranial nerve deficit.     Sensory: No sensory deficit.     Motor: No abnormal muscle tone.     Coordination: Coordination normal.  Psychiatric:        Mood and Affect: Mood normal.        Behavior: Behavior normal.      ED Treatments / Results  Labs (all labs ordered are listed, but only abnormal results are displayed) Labs Reviewed  URINALYSIS, ROUTINE W REFLEX MICROSCOPIC    EKG None  Radiology Dg Pelvis 1-2 Views   Result Date: 09/07/2018 CLINICAL DATA:  Fall. EXAM: PELVIS - 1-2 VIEW COMPARISON:  CT pelvis dated Jul 27, 2018. FINDINGS: No acute fracture or dislocation. Old healed fractures of the left puboacetabular junction and left inferior pubic ramus. The pubic symphysis and sacroiliac joints are intact. Hip joint spaces are relatively preserved. Osteopenia. Prior lower lumbar fusion. Vascular calcifications. Soft tissues are unremarkable. IMPRESSION: 1.  No acute osseous abnormality. 2. Old left pubic rami fractures. Electronically Signed   By: Titus Dubin M.D.   On: 09/07/2018 14:14   Dg Shoulder Right  Result Date: 09/07/2018 CLINICAL DATA:  Right shoulder pain after fall. EXAM: RIGHT SHOULDER - 2+ VIEW COMPARISON:  None. FINDINGS: No acute fracture or dislocation. Mild-to-moderate acromioclavicular osteoarthritis. Osteopenia. Soft tissues are unremarkable. Old right fifth rib fracture. IMPRESSION: No acute osseous abnormality. Electronically Signed   By: Titus Dubin M.D.   On: 09/07/2018 14:11    Procedures Procedures (including critical care time)  Medications Ordered in ED Medications - No data to display   Initial Impression / Assessment and Plan / ED Course  I have reviewed the triage vital signs and the nursing notes.  Pertinent labs & imaging results that were available during my care of the patient were reviewed by me and considered in my medical decision making (see chart for details).  Clinical Course as of Sep 07 1606  Fri Sep 07, 2018  1357 I was able to reach the patient's daughter DD, on the phone.  She describes a fall today, and another one,  2 days ago when she was going to the bathroom.  Patient has had multiple falls in the last 5 weeks, 1 of which was possibly associated with defibrillator firing.  Overall the patient has been doing better in the last couple of weeks, ambulating more, being more functional with her ADLs at home.  Patient has waxing and waning confusion,  and is sometimes "grumpy."  The patient's daughter is her primary caregiver.   [EW]  1525 Normal  Urinalysis, Routine w reflex microscopic [EW]  1525 Diagnostic images, pelvis and right shoulder, negative for fracture, images reviewed by me.   [EW]  1604 I discussed the findings with the patient's daughter, Freada Bergeron.  All questions answered.  She will come and get the patient, now.   [EW]    Clinical Course User Index [EW] Daleen Bo, MD        Patient Vitals for the past 24 hrs:  BP Temp Temp src Pulse Resp SpO2 Height Weight  09/07/18 1559 135/61 - - 76 17 100 % - -  09/07/18 1530 - - - 70 17 99 % - -  09/07/18 1430 135/64 - - 70 16 100 % - -  09/07/18 1330 (!) 132/48 - - 70 14 98 % - -  09/07/18 1303 (!) 151/71 97.8 F (36.6 C) Oral 71 (!) 22 97 % - -  09/07/18 1300 - - - - - - 5\' 1"  (1.549 m) 59.4 kg    4:05 PM Reevaluation with update and discussion. After initial assessment and treatment, an updated evaluation reveals no change in clinical status, findings discussed and questions answered. Daleen Bo   Medical Decision Making: Patient with multiple falls, without serious injuries anticipated.  Patient had her defibrillator interrogated about a month ago, at that time she had multiple episodes of nonsustained supraventricular tachycardia.  There were no treatment shocks given.  Patient is debilitated, has frequent falls, and is being managed at home with close assistance of family members.  No clinical evidence for suspected illness.  Screening lab/images ordered here today.  No fracture.  Defibrillator interrogated, no treated events.  Device is functional.  CRITICAL CARE-no Performed by: Daleen Bo  Nursing Notes Reviewed/ Care Coordinated Applicable Imaging Reviewed Interpretation of Laboratory Data incorporated into ED treatment  The patient appears reasonably screened and/or stabilized for discharge and I doubt any other medical condition or other [facility]  requiring further screening, evaluation, or treatment in the ED at this time prior to discharge.  Plan: Home Medications-continue usual; Home Treatments-rest, fluids, regular diet; return here if the recommended treatment, does not improve the symptoms; Recommended follow up-PCP follow-up for checkup in 1 to 2 weeks and as needed.   Final Clinical Impressions(s) / ED Diagnoses   Final diagnoses:  Contusion of right shoulder, initial encounter  Contusion of right hip, initial encounter  Fall, initial encounter    ED Discharge Orders    None       Daleen Bo, MD 09/07/18 [phone]

## 2018-09-07 NOTE — ED Notes (Signed)
Spoke with Sam, rep from [employer] - will fax interrogation results to ED

## 2018-09-12 ENCOUNTER — Encounter: Payer: Self-pay | Admitting: Family Medicine

## 2018-09-14 ENCOUNTER — Telehealth: Payer: Self-pay | Admitting: Cardiology

## 2018-09-14 NOTE — Telephone Encounter (Signed)
New Message     Carlyon Shadow is calling and says the  Pt has had multiple falls, she says she tells the family she needs to not be walking around by herself without assistance   Please call

## 2018-09-14 NOTE — Telephone Encounter (Signed)
Left detailed message on [facility] voicemail advising them to f/u w/ pt's PCP to discuss further. Asked to call back if she needs to discuss anything else.  Will make Dr. Curt Bears aware of falls d/t anticoagulation.

## 2018-09-27 ENCOUNTER — Emergency Department (HOSPITAL_BASED_OUTPATIENT_CLINIC_OR_DEPARTMENT_OTHER): Payer: Medicare Other

## 2018-09-27 ENCOUNTER — Telehealth (INDEPENDENT_AMBULATORY_CARE_PROVIDER_SITE_OTHER): Payer: Medicare Other | Admitting: Family Medicine

## 2018-09-27 ENCOUNTER — Emergency Department (HOSPITAL_COMMUNITY)
Admission: EM | Admit: 2018-09-27 | Discharge: 2018-09-28 | Disposition: A | Discharge status: Home / Self Care | Payer: Medicare Other | Attending: Emergency Medicine | Admitting: Emergency Medicine

## 2018-09-27 ENCOUNTER — Other Ambulatory Visit: Payer: Self-pay

## 2018-09-27 ENCOUNTER — Encounter (HOSPITAL_COMMUNITY): Payer: Self-pay

## 2018-09-27 ENCOUNTER — Emergency Department (HOSPITAL_COMMUNITY): Payer: Medicare Other

## 2018-09-27 DIAGNOSIS — I10 Essential (primary) hypertension: Secondary | ICD-10-CM | POA: Insufficient documentation

## 2018-09-27 DIAGNOSIS — R0602 Shortness of breath: Secondary | ICD-10-CM | POA: Diagnosis not present

## 2018-09-27 DIAGNOSIS — L539 Erythematous condition, unspecified: Secondary | ICD-10-CM | POA: Diagnosis not present

## 2018-09-27 DIAGNOSIS — E119 Type 2 diabetes mellitus without complications: Secondary | ICD-10-CM | POA: Diagnosis not present

## 2018-09-27 DIAGNOSIS — R2243 Localized swelling, mass and lump, lower limb, bilateral: Secondary | ICD-10-CM | POA: Insufficient documentation

## 2018-09-27 DIAGNOSIS — Z85828 Personal history of other malignant neoplasm of skin: Secondary | ICD-10-CM | POA: Insufficient documentation

## 2018-09-27 DIAGNOSIS — M7989 Other specified soft tissue disorders: Secondary | ICD-10-CM

## 2018-09-27 DIAGNOSIS — R350 Frequency of micturition: Secondary | ICD-10-CM | POA: Insufficient documentation

## 2018-09-27 DIAGNOSIS — R52 Pain, unspecified: Secondary | ICD-10-CM | POA: Diagnosis not present

## 2018-09-27 NOTE — ED Triage Notes (Signed)
Patient complains of right calf pain x 4 days. Swelling and tenderness to same. Here from family practice for doppler. NAD

## 2018-09-27 NOTE — Assessment & Plan Note (Signed)
Concern for DVT given hx of unilateral leg swelling with unilateral redness, heat and tenderness. Patient with h/o of a fib on xarelto. She denis any SOB or cough. Advised to go to the ED for [place] of RLE and evaluation in person. Daughter states that OT is coming in next 30 minutes and can assist patient into the car.  -Patient and daughter voiced understanding of plan

## 2018-09-27 NOTE — Progress Notes (Signed)
[facility] Telemedicine Visit  Patient consented to have virtual visit. Method of visit: Video  Encounter participants: Patient: Brenda [name] - located at home Provider: [place] Alethia Melendrez - located at home Others (if applicable): Daughter, [name]  Chief Complaint: R calf pain  HPI:  Right leg has been swelling since Monday. Left leg weaker than right leg due to previous back surgeries. Daughter reports that the swelling started 3 days ago with some redness and heat in the leg on Monday. She reports the heat and redness have improved. Patient is currently on Xarelto 15mg  daily for atrial fibrillation. She denies any SOB, cough, fevers, chills, known exposure to [place]. Patient is worried she may have a blood clot. She has never hd any swelling in her leg like this before. She states that it is very painful, especially on her calf.   ROS: per HPI  Pertinent PMHx: A fib on Xarelto, HTN, osteoporosis, GERD, h/o CVA  Exam:  General: well-appearing, able to lay down in bed and sit up with minimal assistance Respiratory: breathing comfortably Extremities: swelling noted on R leg more than left leg through video with no notable pitting edema with daughter performing test. No significant redness noted, but patient with tenderness on R calf with daughter's exam. Some mild bruising noted around R knee.   Assessment/Plan:  Swollen leg Concern for DVT given hx of unilateral leg swelling with unilateral redness, heat and tenderness. Patient with h/o of a fib on xarelto. She denis any SOB or cough. Advised to go to the ED for [place] of RLE and evaluation in person. Daughter states that OT is coming in next 30 minutes and can assist patient into the car.  -Patient and daughter voiced understanding of plan    Time spent during visit with patient: 10 minutes  [place] Harley Fitzwater, DO PGY-2, [place]

## 2018-09-27 NOTE — Progress Notes (Signed)
Lower extremity venous has been completed.   Preliminary results in CV Proc.    Results given to [facility].  Abram Sander 09/27/2018 6:07 PM

## 2018-09-28 DIAGNOSIS — R2243 Localized swelling, mass and lump, lower limb, bilateral: Secondary | ICD-10-CM | POA: Diagnosis not present

## 2018-09-28 LAB — COMPREHENSIVE METABOLIC PANEL
ALT: 17 U/L (ref 0–44)
AST: 24 U/L (ref 15–41)
Albumin: 2.8 g/dL — ABNORMAL LOW (ref 3.5–5.0)
Alkaline Phosphatase: 155 U/L — ABNORMAL HIGH (ref 38–126)
Anion gap: 9 (ref 5–15)
BUN: 21 mg/dL (ref 8–23)
CO2: 20 mmol/L — ABNORMAL LOW (ref 22–32)
Calcium: 9 mg/dL (ref 8.9–10.3)
Chloride: 105 mmol/L (ref 98–111)
Creatinine, Ser: 1.22 mg/dL — ABNORMAL HIGH (ref 0.44–1.00)
GFR calc Af Amer: 47 mL/min — ABNORMAL LOW (ref >60)
GFR calc non Af Amer: 41 mL/min — ABNORMAL LOW (ref >60)
Glucose, Bld: 159 mg/dL — ABNORMAL HIGH (ref 70–99)
Potassium: 5 mmol/L (ref 3.5–5.1)
Sodium: 134 mmol/L — ABNORMAL LOW (ref 135–145)
Total Bilirubin: 0.8 mg/dL (ref 0.3–1.2)
Total Protein: 6.5 g/dL (ref 6.5–8.1)

## 2018-09-28 LAB — CBC WITH DIFFERENTIAL/PLATELET
Abs Immature Granulocytes: 0.03 10*3/uL (ref 0.00–0.07)
Basophils Absolute: 0 10*3/uL (ref 0.0–0.1)
Basophils Relative: 0 %
Eosinophils Absolute: 0.1 10*3/uL (ref 0.0–0.5)
Eosinophils Relative: 1 %
HCT: 32 % — ABNORMAL LOW (ref 36.0–46.0)
Hemoglobin: 10.5 g/dL — ABNORMAL LOW (ref 12.0–15.0)
Immature Granulocytes: 0 %
Lymphocytes Relative: 14 %
Lymphs Abs: 1.2 10*3/uL (ref 0.7–4.0)
MCH: 33.9 pg (ref 26.0–34.0)
MCHC: 32.8 g/dL (ref 30.0–36.0)
MCV: 103.2 fL — ABNORMAL HIGH (ref 80.0–100.0)
Monocytes Absolute: 0.9 10*3/uL (ref 0.1–1.0)
Monocytes Relative: 10 %
Neutro Abs: 6.8 10*3/uL (ref 1.7–7.7)
Neutrophils Relative %: 75 %
Platelets: 247 10*3/uL (ref 150–400)
RBC: 3.1 MIL/uL — ABNORMAL LOW (ref 3.87–5.11)
RDW: 14 % (ref 11.5–15.5)
WBC: 9.1 10*3/uL (ref 4.0–10.5)
nRBC: 0 % (ref 0.0–0.2)

## 2018-09-28 LAB — BRAIN NATRIURETIC PEPTIDE: B Natriuretic Peptide: 255.2 pg/mL — ABNORMAL HIGH (ref 0.0–100.0)

## 2018-09-28 MED ORDER — FUROSEMIDE 20 MG PO TABS: 20.0000 mg | ORAL_TABLET | Freq: Once | ORAL | Status: DC

## 2018-09-28 MED ORDER — FUROSEMIDE 20 MG PO TABS: 20.0000 mg | ORAL_TABLET | Freq: Every day | ORAL | 0 refills | Status: DC

## 2018-09-28 NOTE — ED Provider Notes (Signed)
Emergency Department Provider Note   I have reviewed the triage vital signs and the nursing notes.   HISTORY  Chief Complaint possible DVT   HPI Brenda [name] is a 85 y.o. female who presents the ER with her daughter for complaints of leg swelling.  Patient states that the right leg was warm and red a couple days ago with swelling and has progressively worsened.  Now she started swelling her left leg as well.  This is new for her.  She talked to her doctor on the phone and because the patient's decreased mobility is concerned about DVTs is a here for further evaluation.  Patient states that she is just pain at this time but the redness seems to have improved.  She does have some shortness of breath when she lies flat and has increased frequency of urination at night as well.  No chest pain.  No fevers or cough specifically.  No history of the same.  No trauma.   No other associated or modifying symptoms.    Past Medical History:  Diagnosis Date   Anemia    Arthritis    Basal cell carcinoma    nose   Diabetes mellitus without complication ([place])    pt denies diabetes   GERD (gastroesophageal reflux disease)    Hiatal hernia    HLD (hyperlipidemia)    Hypertension    Seizure ([place]) 2011    Patient Active Problem List   Diagnosis Date Noted   Swollen leg 09/27/2018   Black stool 07/19/2018   Constipation 07/16/2018   Melena 26/94/8546   Lichen sclerosus 27/05/5007   Grief reaction 01/19/2018   Acute pain of left shoulder 07/27/2017   GERD (gastroesophageal reflux disease) 07/18/2017   Gastrointestinal hemorrhage    Symptomatic anemia 07/17/2017   Iron deficiency anemia 05/09/2017   Osteoporosis 11/24/2016   Skin cancer of nose 11/23/2016   Detrusor instability 11/23/2016   Paroxysmal A-fib ([place]) 08/05/2016   Essential hypertension 05/05/2016   Type 2 diabetes mellitus without complication, without long-term current use of insulin ([place])  05/05/2016   Hiatal hernia 05/05/2016   History of CVA (cerebrovascular accident) without residual deficits 05/05/2016   Memory change 05/05/2016   AICD (automatic cardioverter/defibrillator) present 05/05/2016   Back pain 05/05/2016    Past Surgical History:  Procedure Laterality Date   CARDIAC DEFIBRILLATOR PLACEMENT     CATARACT EXTRACTION Left 02/2001   CHOLECYSTECTOMY     COLONOSCOPY WITH PROPOFOL N/A 07/19/2017   Procedure: COLONOSCOPY WITH PROPOFOL;  Surgeon: Jerene Bears, MD;  Location: [facility] ENDOSCOPY;  Service: Gastroenterology;  Laterality: N/A;   ESOPHAGOGASTRODUODENOSCOPY (EGD) WITH PROPOFOL N/A 07/19/2017   Procedure: ESOPHAGOGASTRODUODENOSCOPY (EGD) WITH PROPOFOL;  Surgeon: Jerene Bears, MD;  Location: [facility] ENDOSCOPY;  Service: Gastroenterology;  Laterality: N/A;   LUMBAR LAMINECTOMY  06/11/2001   LUMBAR LAMINECTOMY  02/09/2006   LUMBAR LAMINECTOMY  07/07/2006   with spianal cord stimulator   SPINAL CORD STIMULATOR REMOVAL  14/2009   TUBAL LIGATION      Current Outpatient Rx   Order #: [id number] Class: Historical Med   Order #: [id number] Class: Normal   Order #: [id number] Class: Normal   Order #: [id number] Class: Historical Med   Order #: [id number] Class: Historical Med   Order #: [id number] Class: Normal   Order #: [id number] Class: Historical Med   Order #: [id number] Class: Historical Med   Order #: [id number] Class: Historical Med   Order #: [id number] Class: Normal   Order #: [id number] Class: Normal   Order #: [id number] Class: Normal  Order #: [id number] Class: Normal    Allergies Sotalol, Erythromycin, Augmentin [amoxicillin-pot clavulanate], Crestor [rosuvastatin calcium], Keflex [cephalexin], and Septra [sulfamethoxazole-trimethoprim]  Family History  Problem Relation Age of Onset   Diabetes Mother    Heart failure Father    Breast cancer Sister    Heart failure Sister    Diabetes Son    Diabetes Sister    Colon cancer Neg Hx     Stomach cancer Neg Hx    Rectal cancer Neg Hx    Esophageal cancer Neg Hx     Social History Social History   Tobacco Use   Smoking status: Never Smoker   Smokeless tobacco: Never Used  Substance Use Topics   Alcohol use: No   Drug use: No    Review of Systems  All other systems negative except as documented in the HPI. All pertinent positives and negatives as reviewed in the HPI. ____________________________________________   PHYSICAL EXAM:  VITAL SIGNS: ED Triage Vitals  Enc Vitals Group     BP 09/27/18 1740 125/68     Pulse Rate 09/27/18 1740 80     Resp 09/27/18 1740 16     Temp 09/27/18 1740 97.9 F (36.6 C)     Temp Source 09/27/18 1740 Oral     SpO2 09/27/18 1740 100 %     Weight 09/28/18 0015 131 lb 2.8 oz (59.5 kg)     Height 09/28/18 0015 5\' 1"  (1.549 m)     Head Circumference --      Peak Flow --      Pain Score 09/27/18 1739 5     Pain Loc --      Pain Edu? --      Excl. in [place]? --     Constitutional: Alert and oriented. Well appearing and in no acute distress. Eyes: Conjunctivae are normal. PERRL. EOMI. Head: Atraumatic. Nose: No congestion/rhinnorhea. Mouth/Throat: Mucous membranes are moist.  Oropharynx non-erythematous. Neck: No stridor.  No meningeal signs.   Cardiovascular: Normal rate, regular rhythm. Feet slightly cool but dopplerable pulses. Grossly normal heart sounds.   Respiratory: Normal respiratory effort.  No retractions. Lungs CTAB. Gastrointestinal: Soft and nontender. No distention.  Musculoskeletal: No lower extremity tenderness nor edema. No gross deformities of extremities. Neurologic:  Normal speech and language. No gross focal neurologic deficits are appreciated.  Skin:  Skin is warm, dry and intact. No rash noted.  ____________________________________________   LABS (all labs ordered are listed, but only abnormal results are displayed)  Labs Reviewed  CBC WITH DIFFERENTIAL/PLATELET - Abnormal; Notable for the  following components:      Result Value   RBC 3.10 (*)    Hemoglobin 10.5 (*)    HCT 32.0 (*)    MCV 103.2 (*)    All other components within normal limits  COMPREHENSIVE METABOLIC PANEL - Abnormal; Notable for the following components:   Sodium 134 (*)    CO2 20 (*)    Glucose, Bld 159 (*)    Creatinine, Ser 1.22 (*)    Albumin 2.8 (*)    Alkaline Phosphatase 155 (*)    GFR calc non Af Amer 41 (*)    GFR calc Af Amer 47 (*)    All other components within normal limits  BRAIN NATRIURETIC PEPTIDE - Abnormal; Notable for the following components:   B Natriuretic Peptide 255.2 (*)    All other components within normal limits   ____________________________________________  EKG   EKG Interpretation  Date/Time:  Friday September 28 2018 00:14:16 EDT Ventricular Rate:  95 PR Interval:    QRS Duration: 89 QT Interval:  386 QTC Calculation: 486 R Axis:   -18 Text Interpretation:  Sinus rhythm Atrial premature complex Borderline left axis deviation Borderline repolarization abnormality Borderline prolonged QT interval improved nonspecific ST changes since Confirmed by Merrily Pew [phone]) on 09/28/2018 1:40:14 AM       ____________________________________________  RADIOLOGY  Dg Chest Portable 1 View  Result Date: 09/28/2018 CLINICAL DATA:  Right leg swelling EXAM: PORTABLE CHEST 1 VIEW COMPARISON:  07/27/2018 FINDINGS: Left AICD remains in place, unchanged. Heart is borderline in size. No confluent opacities or effusions. No acute bony abnormality. IMPRESSION: Borderline cardiomegaly.  No active disease. Electronically Signed   By: Rolm Baptise M.D.   On: 09/28/2018 00:10   Vas [place] Lower Extremity Venous (dvt) (only [facility])  Result Date: 09/27/2018  Lower Venous Study Indications: Pain.  Comparison Study: No prior Performing Technologist: Abram Sander RVS  Examination Guidelines: A complete evaluation includes B-mode imaging, spectral Doppler, color Doppler, and power Doppler as needed  of all accessible portions of each vessel. Bilateral testing is considered an integral part of a complete examination. Limited examinations for reoccurring indications may be performed as noted.  +---------+---------------+---------+-----------+----------+-------------------+  RIGHT     Compressibility Phasicity Spontaneity Properties Summary              +---------+---------------+---------+-----------+----------+-------------------+  CFV       Full            Yes       Yes                                         +---------+---------------+---------+-----------+----------+-------------------+  SFJ       Full                                                                  +---------+---------------+---------+-----------+----------+-------------------+  FV Prox   Full                                                                  +---------+---------------+---------+-----------+----------+-------------------+  FV Mid    Full                                                                  +---------+---------------+---------+-----------+----------+-------------------+  FV Distal Full                                                                  +---------+---------------+---------+-----------+----------+-------------------+  PFV       Full                                                                  +---------+---------------+---------+-----------+----------+-------------------+  POP       Full            Yes       Yes                                         +---------+---------------+---------+-----------+----------+-------------------+  PTV                                                        not visualized well  +---------+---------------+---------+-----------+----------+-------------------+  PERO                                                       not visualized well  +---------+---------------+---------+-----------+----------+-------------------+     Summary: Right: There is no evidence of deep  vein thrombosis in the lower extremity. No cystic structure found in the popliteal fossa.  *See table(s) above for measurements and observations.    Preliminary     ____________________________________________   INITIAL IMPRESSION / ASSESSMENT AND PLAN / ED COURSE  Suspect edema. No evidence of arterial or venous occlusion. No evidence of cellulitis. Will start short course of lasix with cardiology follow up.   Pertinent labs & imaging results that were available during my care of the patient were reviewed by me and considered in my medical decision making (see chart for details).  A medical screening exam was performed and I feel the patient has had an appropriate workup for their chief complaint at this time and likelihood of emergent condition existing is low. They have been counseled on decision, discharge, follow up and which symptoms necessitate immediate return to the emergency department. They or their family verbally stated understanding and agreement with plan and discharged in stable condition.   ____________________________________________  FINAL CLINICAL IMPRESSION(S) / ED DIAGNOSES  Final diagnoses:  Leg swelling     MEDICATIONS GIVEN DURING THIS VISIT:  Medications - No data to display   NEW OUTPATIENT MEDICATIONS STARTED DURING THIS VISIT:  New Prescriptions   FUROSEMIDE (LASIX) 20 MG TABLET    Take 1 tablet (20 mg total) by mouth daily for 7 days.    Note:  This note was prepared with assistance of Dragon voice recognition software. Occasional wrong-word or sound-a-like substitutions may have occurred due to the inherent limitations of voice recognition software.   Brady Schiller, Corene Cornea, MD 09/28/18 [phone]

## 2018-10-04 ENCOUNTER — Telehealth (INDEPENDENT_AMBULATORY_CARE_PROVIDER_SITE_OTHER): Payer: Medicare Other | Admitting: Cardiology

## 2018-10-04 ENCOUNTER — Other Ambulatory Visit: Payer: Self-pay

## 2018-10-04 DIAGNOSIS — I480 Paroxysmal atrial fibrillation: Secondary | ICD-10-CM | POA: Diagnosis not present

## 2018-10-04 NOTE — Progress Notes (Signed)
Electrophysiology TeleHealth Note   Due to national recommendations of social distancing due to COVID 19, an audio/video telehealth visit is felt to be most appropriate for this patient at this time.  See Epic message for the patient's consent to telehealth for [facility].   Date:  10/04/2018   ID:  Brenda [name], DOB 1931-07-12, MRN [id number]  Location: patient's home  Provider location: [address] [place]  Evaluation Performed: Follow-up visit  PCP:  Benay Pike, MD  Cardiologist:  Kush Farabee Meredith Leeds, MD  Electrophysiologist:  Dr Curt Bears  Chief Complaint:  SVT, leg swelling  History of Present Illness:    Brenda [name] is a 85 y.o. female who presents via audio/video conferencing for a telehealth visit today.  Since last being seen in our clinic, the patient reports doing very well.  Today, she denies symptoms of palpitations, chest pain, shortness of breath,  lower extremity edema, dizziness, presyncope, or syncope.  The patient is otherwise without complaint today.  The patient denies symptoms of fevers, chills, cough, or new SOB worrisome for COVID 19.  She has a history of CVA, diabetes, and hypertension.  She does have a history of ventricular tachycardia and has a Chemical engineer ICD in place.  She is currently on sotalol.  She has had rapid atrial fibrillation and has had shocks from her ICD.  Today, denies symptoms of palpitations, chest pain, shortness of breath, orthopnea, PND, claudication, dizziness, presyncope, syncope, bleeding, or neurologic sequela. The patient is tolerating medications without difficulties.  She presented emergency room 1 week ago with lower extremity swelling.  She was also having some redness in her right lower extremity.  It was not thought that this was due to cellulitis.  Low extremity Doppler showed no evidence of DVT.  She was put on 20 mg of Lasix to take a day for the next week.  Her lower extremity edema  has improved but has not fully gone away.  Past Medical History:  Diagnosis Date  . Anemia   . Arthritis   . Basal cell carcinoma    nose  . Diabetes mellitus without complication ([place])    pt denies diabetes  . GERD (gastroesophageal reflux disease)   . Hiatal hernia   . HLD (hyperlipidemia)   . Hypertension   . Seizure ([place]) 2011    Past Surgical History:  Procedure Laterality Date  . CARDIAC DEFIBRILLATOR PLACEMENT    . CATARACT EXTRACTION Left 02/2001  . CHOLECYSTECTOMY    . COLONOSCOPY WITH PROPOFOL N/A 07/19/2017   Procedure: COLONOSCOPY WITH PROPOFOL;  Surgeon: Jerene Bears, MD;  Location: [facility];  Service: Gastroenterology;  Laterality: N/A;  . ESOPHAGOGASTRODUODENOSCOPY (EGD) WITH PROPOFOL N/A 07/19/2017   Procedure: ESOPHAGOGASTRODUODENOSCOPY (EGD) WITH PROPOFOL;  Surgeon: Jerene Bears, MD;  Location: [facility] ENDOSCOPY;  Service: Gastroenterology;  Laterality: N/A;  . LUMBAR LAMINECTOMY  06/11/2001  . LUMBAR LAMINECTOMY  02/09/2006  . LUMBAR LAMINECTOMY  07/07/2006   with spianal cord stimulator  . SPINAL CORD STIMULATOR REMOVAL  14/2009  . TUBAL LIGATION      Current Outpatient Medications  Medication Sig Dispense Refill  . acetaminophen (TYLENOL) 500 MG tablet Take 500-1,000 mg by mouth every 6 (six) hours as needed for mild pain.    Marland Kitchen atorvastatin (LIPITOR) 40 MG tablet Take 1 tablet (40 mg total) by mouth daily. 90 tablet 2  . bisacodyl (DULCOLAX) 5 MG EC tablet Take 1 tablet (5 mg total) by mouth daily as  needed for moderate constipation. 90 tablet 1  . Cholecalciferol (VITAMIN D-3) 1000 units CAPS Take 1,000 Units by mouth daily.    . ferrous sulfate 325 (65 FE) MG tablet Take 325 mg by mouth 3 (three) times a week. Monday, Wed, Fri.    . furosemide (LASIX) 20 MG tablet Take 1 tablet (20 mg total) by mouth daily for 7 days. 7 tablet 0  . metoprolol tartrate (LOPRESSOR) 100 MG tablet Take 1 tablet (100 mg total) by mouth 2 (two) times daily. 180 tablet 2  .  Multiple Vitamins-Minerals (ICAPS AREDS 2 PO) Take 1 capsule by mouth 2 (two) times daily.     Marland Kitchen omeprazole (PRILOSEC) 40 MG capsule Take 1 capsule (40 mg total) by mouth 2 (two) times daily. Take before breakfast and dinner. 180 capsule 3  . Propylene Glycol (SYSTANE COMPLETE) 0.6 % SOLN Place 1 drop into both eyes daily.    . Rivaroxaban (XARELTO) 15 MG TABS tablet Take 1 tablet (15 mg total) by mouth daily with supper. 90 tablet 2  . tolterodine (DETROL LA) 2 MG 24 hr capsule TAKE 1 CAPSULE(2 MG) BY MOUTH DAILY (Patient taking differently: Take 2 mg by mouth daily. ) 90 capsule 1  . verapamil (CALAN-SR) 240 MG CR tablet Take 1 tablet (240 mg total) by mouth at bedtime. 90 tablet 2   No current facility-administered medications for this visit.     Allergies:   Sotalol, Erythromycin, Augmentin [amoxicillin-pot clavulanate], Crestor [rosuvastatin calcium], Keflex [cephalexin], and Septra [sulfamethoxazole-trimethoprim]   Social History:  The patient  reports that she has never smoked. She has never used smokeless tobacco. She reports that she does not drink alcohol or use drugs.   Family History:  The patient's  family history includes Breast cancer in her sister; Diabetes in her mother, sister, and son; Heart failure in her father and sister.   ROS:  Please see the history of present illness.   All other systems are personally reviewed and negative.    Exam:    Vital Signs:  There were no vitals taken for this visit.  Well appearing, alert and conversant, regular work of breathing,  good skin color Eyes- anicteric, neuro- grossly intact, skin- no apparent rash or lesions or cyanosis, mouth- oral mucosa is pink   Labs/Other Tests and Data Reviewed:    Recent Labs: 09/28/2018: ALT 17; B Natriuretic Peptide 255.2; BUN 21; Creatinine, Ser 1.22; Hemoglobin 10.5; Platelets 247; Potassium 5.0; Sodium 134   Wt Readings from Last 3 Encounters:  09/28/18 131 lb 2.8 oz (59.5 kg)  09/07/18 131  lb (59.4 kg)  07/27/18 133 lb (60.3 kg)     Other studies personally reviewed: Additional studies/ records that were reviewed today include: ECG 09/28/2018 personally reviewed  Review of the above records today demonstrates: Sinus rhythm, rate 95   Last device remote is reviewed from [place] PDF dated 08/09/2018 which reveals normal device function, SVT noted   ASSESSMENT & PLAN:    1.  Paroxysmal atrial fibrillation: Currently on Xarelto and metoprolol.  Minimally symptomatic.  Continue with rate control.  This patients CHA2DS2-VASc Score and unadjusted Ischemic Stroke Rate (% per year) is equal to 7.2 % stroke rate/year from a score of 5  Above score calculated as 1 point each if present [CHF, HTN, DM, Vascular=MI/PAD/Aortic Plaque, Age if 65-74, or Female] Above score calculated as 2 points each if present [Age > 75, or Stroke/TIA/TE]   2.  Inducible ventricular tachycardia: Status post [employer]  ICD.  Functioning appropriately.    3.  Hypertension: Has been well controlled in the past.  4.  Lower extremity edema: This is improved with her Lasix dose prescribed by the emergency room.  I have told her to continue taking Lasix over the next 2 days.  I have also told her that we Cynthie Garmon call in 20 mg tabs of Lasix to take on an as-needed basis for lower extremity swelling.  COVID 19 screen The patient denies symptoms of COVID 19 at this time.  The importance of social distancing was discussed today.  Follow-up: 6 months  Current medicines are reviewed at length with the patient today.   The patient does not have concerns regarding her medicines.  The following changes were made today:  none  Labs/ tests ordered today include:  No orders of the defined types were placed in this encounter.    Patient Risk:  after full review of this patients clinical status, I feel that they are at moderate risk at this time.  Today, I have spent 9 minutes with the patient with telehealth  technology discussing lower extremity swelling.    Signed, Jaklyn Alen Meredith Leeds, MD  10/04/2018 10:12 AM     [facility] [address] [place] [address] [phone] (office) [phone] (fax)

## 2018-10-08 ENCOUNTER — Telehealth: Payer: Self-pay | Admitting: Cardiology

## 2018-10-08 MED ORDER — FUROSEMIDE 20 MG PO TABS: 20.0000 mg | ORAL_TABLET | Freq: Every day | ORAL | 9 refills | Status: DC | PRN

## 2018-10-08 NOTE — Telephone Encounter (Signed)
Yes, OK for refill

## 2018-10-08 NOTE — Telephone Encounter (Signed)
Pt's medication was sent to pt's pharmacy as requested. Confirmation received.  °

## 2018-10-08 NOTE — Telephone Encounter (Signed)
Pt requesting a refill on furosemide 20 mg tablet. This medication is supposed to be taken for 7 days. Would Dr. Curt Bears like to refill this medication? Please address

## 2018-10-08 NOTE — Telephone Encounter (Signed)
New message   Pt c/o medication issue:  1. Name of Medication: furosemide (LASIX) 20 MG tablet(Expired)  2. How are you currently taking this medication (dosage and times per day)? n/a  3. Are you having a reaction (difficulty breathing--STAT)? n/a  4. What is your medication issue? Patient's daughter states that needs a new prescription for this medication sent [facility] on [address] [phone] Fax# [phone]

## 2018-10-31 ENCOUNTER — Other Ambulatory Visit: Payer: Self-pay | Admitting: Cardiology

## 2018-10-31 MED ORDER — METOPROLOL TARTRATE 100 MG PO TABS: 100.0000 mg | ORAL_TABLET | Freq: Two times a day (BID) | ORAL | 3 refills | Status: DC

## 2018-11-08 ENCOUNTER — Ambulatory Visit (INDEPENDENT_AMBULATORY_CARE_PROVIDER_SITE_OTHER): Payer: Medicare Other | Admitting: *Deleted

## 2018-11-08 DIAGNOSIS — I472 Ventricular tachycardia, unspecified: Secondary | ICD-10-CM

## 2018-11-09 LAB — CUP PACEART REMOTE DEVICE CHECK
Battery Remaining Longevity: 30 mo
Battery Remaining Percentage: 36 %
Brady Statistic RA Percent Paced: 72 %
Brady Statistic RV Percent Paced: 0 %
Date Time Interrogation Session: 20200814142300
HighPow Impedance: 46 Ohm
Implantable Lead Implant Date: 20111110
Implantable Lead Implant Date: 20111110
Implantable Lead Location: 753859
Implantable Lead Location: 753860
Implantable Lead Model: 157
Implantable Lead Model: 4135
Implantable Lead Serial Number: 28803817
Implantable Lead Serial Number: 302861
Implantable Pulse Generator Implant Date: 20111110
Lead Channel Impedance Value: 436 Ohm
Lead Channel Impedance Value: 682 Ohm
Lead Channel Pacing Threshold Amplitude: 0.3 V
Lead Channel Pacing Threshold Amplitude: 0.8 V
Lead Channel Pacing Threshold Pulse Width: 0.4 ms
Lead Channel Pacing Threshold Pulse Width: 0.4 ms
Lead Channel Setting Pacing Amplitude: 2 V
Lead Channel Setting Pacing Amplitude: 2.4 V
Lead Channel Setting Pacing Pulse Width: 0.4 ms
Lead Channel Setting Sensing Sensitivity: 0.6 mV
Pulse Gen Serial Number: 169112

## 2018-11-19 ENCOUNTER — Encounter: Payer: Self-pay | Admitting: Cardiology

## 2018-11-19 NOTE — Progress Notes (Signed)
Remote ICD transmission.   

## 2018-11-20 ENCOUNTER — Other Ambulatory Visit: Payer: Self-pay

## 2018-11-20 ENCOUNTER — Encounter (HOSPITAL_COMMUNITY): Payer: Self-pay

## 2018-11-20 ENCOUNTER — Emergency Department (HOSPITAL_COMMUNITY)
Admission: EM | Admit: 2018-11-20 | Discharge: 2018-11-20 | Disposition: A | Discharge status: Home / Self Care | Payer: Medicare Other | Attending: Emergency Medicine | Admitting: Emergency Medicine

## 2018-11-20 DIAGNOSIS — Z79899 Other long term (current) drug therapy: Secondary | ICD-10-CM | POA: Diagnosis not present

## 2018-11-20 DIAGNOSIS — N3001 Acute cystitis with hematuria: Secondary | ICD-10-CM | POA: Diagnosis not present

## 2018-11-20 DIAGNOSIS — Z8673 Personal history of transient ischemic attack (TIA), and cerebral infarction without residual deficits: Secondary | ICD-10-CM | POA: Diagnosis not present

## 2018-11-20 DIAGNOSIS — Z95810 Presence of automatic (implantable) cardiac defibrillator: Secondary | ICD-10-CM | POA: Insufficient documentation

## 2018-11-20 DIAGNOSIS — E119 Type 2 diabetes mellitus without complications: Secondary | ICD-10-CM | POA: Diagnosis not present

## 2018-11-20 DIAGNOSIS — R319 Hematuria, unspecified: Secondary | ICD-10-CM | POA: Diagnosis present

## 2018-11-20 DIAGNOSIS — Z85828 Personal history of other malignant neoplasm of skin: Secondary | ICD-10-CM | POA: Insufficient documentation

## 2018-11-20 DIAGNOSIS — I10 Essential (primary) hypertension: Secondary | ICD-10-CM | POA: Insufficient documentation

## 2018-11-20 LAB — URINALYSIS, ROUTINE W REFLEX MICROSCOPIC
Glucose, UA: NEGATIVE mg/dL
Ketones, ur: NEGATIVE mg/dL
Nitrite: NEGATIVE
Protein, ur: >300 mg/dL — AB
Specific Gravity, Urine: 1.02 (ref 1.005–1.030)
pH: 5.5 (ref 5.0–8.0)

## 2018-11-20 LAB — BASIC METABOLIC PANEL
Anion gap: 11 (ref 5–15)
BUN: 24 mg/dL — ABNORMAL HIGH (ref 8–23)
CO2: 22 mmol/L (ref 22–32)
Calcium: 9 mg/dL (ref 8.9–10.3)
Chloride: 101 mmol/L (ref 98–111)
Creatinine, Ser: 1.38 mg/dL — ABNORMAL HIGH (ref 0.44–1.00)
GFR calc Af Amer: 40 mL/min — ABNORMAL LOW (ref >60)
GFR calc non Af Amer: 35 mL/min — ABNORMAL LOW (ref >60)
Glucose, Bld: 192 mg/dL — ABNORMAL HIGH (ref 70–99)
Potassium: 4.5 mmol/L (ref 3.5–5.1)
Sodium: 134 mmol/L — ABNORMAL LOW (ref 135–145)

## 2018-11-20 LAB — CBC
HCT: 32 % — ABNORMAL LOW (ref 36.0–46.0)
Hemoglobin: 10.5 g/dL — ABNORMAL LOW (ref 12.0–15.0)
MCH: 33.7 pg (ref 26.0–34.0)
MCHC: 32.8 g/dL (ref 30.0–36.0)
MCV: 102.6 fL — ABNORMAL HIGH (ref 80.0–100.0)
Platelets: 232 10*3/uL (ref 150–400)
RBC: 3.12 MIL/uL — ABNORMAL LOW (ref 3.87–5.11)
RDW: 13.6 % (ref 11.5–15.5)
WBC: 8.6 10*3/uL (ref 4.0–10.5)
nRBC: 0 % (ref 0.0–0.2)

## 2018-11-20 LAB — URINALYSIS, MICROSCOPIC (REFLEX): RBC / HPF: >50 RBC/hpf (ref 0–5)

## 2018-11-20 LAB — POC OCCULT BLOOD, ED: Fecal Occult Bld: NEGATIVE

## 2018-11-20 MED ORDER — NITROFURANTOIN MONOHYD MACRO 100 MG PO CAPS: 100.0000 mg | ORAL_CAPSULE | Freq: Two times a day (BID) | ORAL | 0 refills | Status: AC

## 2018-11-20 MED ORDER — ONDANSETRON 4 MG PO TBDP: 4.0000 mg | ORAL_TABLET | Freq: Three times a day (TID) | ORAL | 0 refills | Status: DC | PRN

## 2018-11-20 MED ORDER — NITROFURANTOIN MONOHYD MACRO 100 MG PO CAPS
100.0000 mg | ORAL_CAPSULE | Freq: Once | ORAL | Status: AC
  Administered 2018-11-20: 100 mg via ORAL
  Filled 2018-11-20: qty 1

## 2018-11-20 NOTE — ED Notes (Signed)
pts daughter [name] [phone]

## 2018-11-20 NOTE — Discharge Instructions (Addendum)
You have been seen in the Emergency Department (ED) today for blood in urine.  Your workup today suggests that you have a urinary tract infection (UTI).  Please take your antibiotic as prescribed and over-the-counter pain medication (Tylenol) as needed, but no more than recommended on the label instructions.  Drink PLENTY of fluids.  Call your regular doctor to schedule the next available appointment to follow up on todays ED visit, or return immediately to the ED if your pain worsens, you have decreased urine production, develop fever, persistent vomiting, or other symptoms that concern you.

## 2018-11-20 NOTE — ED Notes (Signed)
Patient verbalizes understanding of discharge instructions. Opportunity for questioning and answers were provided. Armband removed by staff, pt discharged from ED.  

## 2018-11-20 NOTE — ED Triage Notes (Signed)
Pt presents to ED with hematuria noted in [facility] by family this AM.  Pt c/o nausea, lower back pain and loss of appetite.  Denies vomiting. No known hx of kidney stones or kidney  Infection.  Pt has hx of dementia.  Pt is AXOx4 with intermittent confusion.  Denies vomiting or diarrhea.

## 2018-11-20 NOTE — ED Provider Notes (Signed)
[facility] EMERGENCY DEPARTMENT Provider Note   CSN: [id number] Arrival date & time: 11/20/18  1134     History   Chief Complaint Chief Complaint  Patient presents with  . Hematuria    HPI Brenda [name] is a 85 y.o. female with past medical history of hypertension, hyperlipidemia, seizures presents emergency department today with chief complaint of hematuria.  Onset was today.  Patient's daughter reports when she went to check on the patient she noticed bloody urine in the bedside commode as well as on the floor.  Pt is reporting associated dysuria, suprapubic pain and nausea without emesis. Denies fever, chills, shortness of breath, blood in stool, diarrhea. Pt is not anticoagulated. Daughter is at the bedside and contributes to history.She reports patient mentioned chest pain to EMS on their arrival.  Saying is on the left side of her chest radiating down her arm.  She also thinks that her defibrillator fired. She is unsure if this is true. She also mentions 1 episode of dark stool last week.  Chart review shows patient has taken nitrofurantoin in the past for UTIs, her most recent was in July 2019.   Past Medical History:  Diagnosis Date  . Anemia   . Arthritis   . Basal cell carcinoma    nose  . Diabetes mellitus without complication ([place])    pt denies diabetes  . GERD (gastroesophageal reflux disease)   . Hiatal hernia   . HLD (hyperlipidemia)   . Hypertension   . Seizure [facility]) 2011    Patient Active Problem List   Diagnosis Date Noted  . Swollen leg 09/27/2018  . Black stool 07/19/2018  . Constipation 07/16/2018  . Melena 07/16/2018  . Lichen sclerosus 99991111  . Grief reaction 01/19/2018  . Acute pain of left shoulder 07/27/2017  . GERD (gastroesophageal reflux disease) 07/18/2017  . Gastrointestinal hemorrhage   . Symptomatic anemia 07/17/2017  . Iron deficiency anemia 05/09/2017  . Osteoporosis 11/24/2016  . Skin cancer of nose  11/23/2016  . 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 Paroxysmal A-fib ([place]) 08/05/2016  . Essential hypertension 05/05/2016  . Type 2 diabetes mellitus without complication, without long-term current use of insulin ([place]) 05/05/2016  . Hiatal hernia 05/05/2016  . History of CVA (cerebrovascular accident) without residual deficits 05/05/2016  . Memory change 05/05/2016  . AICD (automatic cardioverter/defibrillator) present 05/05/2016  . Back pain 05/05/2016    Past Surgical History:  Procedure Laterality Date  . CARDIAC DEFIBRILLATOR PLACEMENT    . CATARACT EXTRACTION Left 02/2001  . CHOLECYSTECTOMY    . COLONOSCOPY WITH PROPOFOL N/A 07/19/2017   Procedure: COLONOSCOPY WITH PROPOFOL;  Surgeon: Jerene Bears, MD;  Location: [facility];  Service: Gastroenterology;  Laterality: N/A;  . ESOPHAGOGASTRODUODENOSCOPY (EGD) WITH PROPOFOL N/A 07/19/2017   Procedure: ESOPHAGOGASTRODUODENOSCOPY (EGD) WITH PROPOFOL;  Surgeon: Jerene Bears, MD;  Location: [facility] ENDOSCOPY;  Service: Gastroenterology;  Laterality: N/A;  . LUMBAR LAMINECTOMY  06/11/2001  . LUMBAR LAMINECTOMY  02/09/2006  . LUMBAR LAMINECTOMY  07/07/2006   with spianal cord stimulator  . SPINAL CORD STIMULATOR REMOVAL  14/2009  . TUBAL LIGATION       OB History   No obstetric history on file.      Home Medications    Prior to Admission medications   Medication Sig Start Date End Date Taking? Authorizing Provider  acetaminophen (TYLENOL) 500 MG tablet Take 500-1,000 mg by mouth every 6 (six) hours as needed for mild pain.    [provider]  atorvastatin (LIPITOR) 40 MG tablet TAKE 1 TABLET(40 MG) BY MOUTH DAILY 10/31/18   Camnitz, Ocie Doyne, MD  bisacodyl (DULCOLAX) 5 MG EC tablet Take 1 tablet (5 mg total) by mouth daily as needed for moderate constipation. 07/24/18   Armbruster, Carlota Raspberry, MD  Cholecalciferol (VITAMIN D-3) 1000 units CAPS Take 1,000 Units by mouth daily.    [provider]  ferrous sulfate 325 (65  FE) MG tablet Take 325 mg by mouth 3 (three) times a week. Monday, Wed, Fri.    [provider]  furosemide (LASIX) 20 MG tablet Take 1 tablet (20 mg total) by mouth daily as needed. 10/08/18   Camnitz, Ocie Doyne, MD  metoprolol tartrate (LOPRESSOR) 100 MG tablet Take 1 tablet (100 mg total) by mouth 2 (two) times daily. 10/31/18   Camnitz, Ocie Doyne, MD  Multiple Vitamins-Minerals (ICAPS AREDS 2 PO) Take 1 capsule by mouth 2 (two) times daily.     [provider]  nitrofurantoin, macrocrystal-monohydrate, (MACROBID) 100 MG capsule Take 1 capsule (100 mg total) by mouth 2 (two) times daily for 7 days. 11/20/18 11/27/18  Kinjal Neitzke E, PA-C  omeprazole (PRILOSEC) 40 MG capsule Take 1 capsule (40 mg total) by mouth 2 (two) times daily. Take before breakfast and dinner. 07/24/18   Armbruster, Carlota Raspberry, MD  ondansetron (ZOFRAN ODT) 4 MG disintegrating tablet Take 1 tablet (4 mg total) by mouth every 8 (eight) hours as needed for nausea or vomiting. 11/20/18   Daltyn Degroat, Verline Lema E, PA-C  Propylene Glycol (SYSTANE COMPLETE) 0.6 % SOLN Place 1 drop into both eyes daily.    [provider]  Rivaroxaban (XARELTO) 15 MG TABS tablet Take 1 tablet (15 mg total) by mouth daily with supper. 04/20/18   Camnitz, Ocie Doyne, MD  tolterodine (DETROL LA) 2 MG 24 hr capsule TAKE 1 CAPSULE(2 MG) BY MOUTH DAILY Patient taking differently: Take 2 mg by mouth daily.  04/19/18   Glenis Smoker, MD  verapamil (CALAN-SR) 240 MG CR tablet Take 1 tablet (240 mg total) by mouth at bedtime. 04/23/18   Camnitz, Ocie Doyne, MD  sucralfate (CARAFATE) 1 GM/10ML suspension Take 10 mLs (1 g total) by mouth every 6 (six) hours as needed. Patient not taking: Reported on 07/27/2018 07/24/18 09/28/18  Armbruster, Carlota Raspberry, MD    Family History Family History  Problem Relation Age of Onset  . Diabetes Mother   . Heart failure Father   . Breast cancer Sister   . Heart failure Sister   . Diabetes Son   .  Diabetes Sister   . Colon cancer Neg Hx   . Stomach cancer Neg Hx   . Rectal cancer Neg Hx   . Esophageal cancer Neg Hx     Social History Social History   Tobacco Use  . Smoking status: Never Smoker  . Smokeless tobacco: Never Used  Substance Use Topics  . Alcohol use: No  . Drug use: No     Allergies   Sotalol, Erythromycin, Augmentin [amoxicillin-pot clavulanate], Crestor [rosuvastatin calcium], Keflex [cephalexin], and Septra [sulfamethoxazole-trimethoprim]   Review of Systems Review of Systems  Constitutional: Negative for chills and fever.  HENT: Negative for congestion, ear discharge, ear pain, sinus pressure, sinus pain and sore throat.   Eyes: Negative for pain and redness.  Respiratory: Negative for cough and shortness of breath.   Cardiovascular: Positive for chest pain.  Gastrointestinal: Negative for abdominal pain, constipation, diarrhea, nausea and vomiting.  Genitourinary: Positive for dysuria  and hematuria.  Musculoskeletal: Negative for back pain and neck pain.  Skin: Negative for wound.  Neurological: Negative for weakness, numbness and headaches.     Physical Exam Updated Vital Signs BP 125/81   Pulse 71   Temp 98.4 F (36.9 C) (Rectal)   Resp 16   Ht 5\' 1"  (1.549 m)   Wt 56.7 kg   SpO2 100%   BMI 23.62 kg/m   Physical Exam Vitals signs and nursing note reviewed.  Constitutional:      General: She is not in acute distress.    Appearance: She is not ill-appearing.  HENT:     Head: Normocephalic and atraumatic.     Right Ear: Tympanic membrane and external ear normal.     Left Ear: Tympanic membrane and external ear normal.     Nose: Nose normal.     Mouth/Throat:     Mouth: Mucous membranes are moist.     Pharynx: Oropharynx is clear.  Eyes:     General: No scleral icterus.       Right eye: No discharge.        Left eye: No discharge.     Extraocular Movements: Extraocular movements intact.     Conjunctiva/sclera: Conjunctivae  normal.     Pupils: Pupils are equal, round, and reactive to light.  Neck:     Musculoskeletal: Normal range of motion.     Vascular: No JVD.  Cardiovascular:     Rate and Rhythm: Normal rate and regular rhythm.     Pulses: Normal pulses.          Radial pulses are 2+ on the right side and 2+ on the left side.     Heart sounds: Normal heart sounds.  Pulmonary:     Comments: Lungs clear to auscultation in all fields. Symmetric chest rise. No wheezing, rales, or rhonchi. Chest:     Chest wall: No tenderness.  Abdominal:     Tenderness: There is no right CVA tenderness or left CVA tenderness.     Comments: Abdomen is soft, non-distended. Mild suprapubic tenderness. No rigidity, no guarding. No peritoneal signs.  Genitourinary:    Comments: Chaperone Vallery Ridge present for exam. Digital Rectal Exam reveals sphincter with good tone. No external hemorrhoids. No masses or fissures. Stool color is brown with no overt blood. No gross melena.  Musculoskeletal: Normal range of motion.  Skin:    General: Skin is warm and dry.     Capillary Refill: Capillary refill takes less than 2 seconds.  Neurological:     Mental Status: She is oriented to person, place, and time.     GCS: GCS eye subscore is 4. GCS verbal subscore is 5. GCS motor subscore is 6.     Comments: Fluent speech, no facial droop. Pt is mostly alert and oriented x4. She has moments of confusion but is easily redirected.  Psychiatric:        Behavior: Behavior normal.      ED Treatments / Results  Labs (all labs ordered are listed, but only abnormal results are displayed) Labs Reviewed  BASIC METABOLIC PANEL - Abnormal; Notable for the following components:      Result Value   Sodium 134 (*)    Glucose, Bld 192 (*)    BUN 24 (*)    Creatinine, Ser 1.38 (*)    GFR calc non Af Amer 35 (*)    GFR calc Af Amer 40 (*)    All other components  within normal limits  CBC - Abnormal; Notable for the following components:   RBC  3.12 (*)    Hemoglobin 10.5 (*)    HCT 32.0 (*)    MCV 102.6 (*)    All other components within normal limits  URINALYSIS, ROUTINE W REFLEX MICROSCOPIC - Abnormal; Notable for the following components:   Color, Urine AMBER (*)    APPearance CLOUDY (*)    Hgb urine dipstick LARGE (*)    Bilirubin Urine SMALL (*)    Protein, ur >300 (*)    Leukocytes,Ua MODERATE (*)    All other components within normal limits  URINALYSIS, MICROSCOPIC (REFLEX) - Abnormal; Notable for the following components:   Bacteria, UA MANY (*)    All other components within normal limits  URINE CULTURE  POC OCCULT BLOOD, ED    EKG EKG Interpretation  Date/Time:  Tuesday November 20 2018 13:31:56 EDT Ventricular Rate:  70 PR Interval:    QRS Duration: 93 QT Interval:  421 QTC Calculation: 455 R Axis:   -12 Text Interpretation:  Sinus rhythm Confirmed by Virgel Manifold [phone]) on 11/20/2018 2:21:01 PM   Radiology No results found.  Procedures Procedures (including critical care time)  Medications Ordered in ED Medications  nitrofurantoin (macrocrystal-monohydrate) (MACROBID) capsule 100 mg (100 mg Oral Given 11/20/18 1429)     Initial Impression / Assessment and Plan / ED Course  I have reviewed the triage vital signs and the nursing notes.  Pertinent labs & imaging results that were available during my care of the patient were reviewed by me and considered in my medical decision making (see chart for details).  Pt has been diagnosed with a UTI, urine culture sent. Pt is afebrile, mild suprapubic tenderness without peritoneal signs, no CVA tenderness, normotensive, and denies N/V. Hemoglobin appears to be at her baseline, stable at 10.5. Fecal occult negative, no gross melena on exam.  She has cephalosporin allergies. Will treat with macrobid x1 week. Daughter reports pt mentioned CP and defibrillator firing today. When pt describes this to me she has moments of confusion. She denies pain on exam.  Defibrillator interrogated without findings. EKG without ischemic changes.  The patient appears reasonably screened and/or stabilized for discharge and I doubt any other medical condition or other [facility] requiring further screening, evaluation, or treatment in the ED at this time prior to discharge. The patient is safe for discharge with strict return precautions discussed. Recommend pcp follow up if symptoms persist. Findings and plan of care discussed with supervising physician Dr. Wilson Singer.  This note was prepared using Dragon voice recognition software and may include unintentional dictation errors due to the inherent limitations of voice recognition software.    Final Clinical Impressions(s) / ED Diagnoses   Final diagnoses:  Acute cystitis with hematuria    ED Discharge Orders         Ordered    nitrofurantoin, macrocrystal-monohydrate, (MACROBID) 100 MG capsule  2 times daily     11/20/18 1551    ondansetron (ZOFRAN ODT) 4 MG disintegrating tablet  Every 8 hours PRN     11/20/18 1551           Flint Melter 11/20/18 2300    Virgel Manifold, MD 11/22/18 1138

## 2018-11-21 LAB — URINE CULTURE

## 2018-11-28 ENCOUNTER — Emergency Department (HOSPITAL_COMMUNITY)
Admission: EM | Admit: 2018-11-28 | Discharge: 2018-11-28 | Disposition: A | Discharge status: Home / Self Care | Payer: Medicare Other | Attending: Emergency Medicine | Admitting: Emergency Medicine

## 2018-11-28 ENCOUNTER — Encounter (HOSPITAL_COMMUNITY): Payer: Self-pay | Admitting: Emergency Medicine

## 2018-11-28 ENCOUNTER — Emergency Department (HOSPITAL_COMMUNITY): Payer: Medicare Other

## 2018-11-28 ENCOUNTER — Other Ambulatory Visit: Payer: Self-pay

## 2018-11-28 DIAGNOSIS — E1122 Type 2 diabetes mellitus with diabetic chronic kidney disease: Secondary | ICD-10-CM | POA: Diagnosis not present

## 2018-11-28 DIAGNOSIS — N183 Chronic kidney disease, stage 3 (moderate): Secondary | ICD-10-CM | POA: Insufficient documentation

## 2018-11-28 DIAGNOSIS — I129 Hypertensive chronic kidney disease with stage 1 through stage 4 chronic kidney disease, or unspecified chronic kidney disease: Secondary | ICD-10-CM | POA: Diagnosis not present

## 2018-11-28 DIAGNOSIS — Z7901 Long term (current) use of anticoagulants: Secondary | ICD-10-CM | POA: Insufficient documentation

## 2018-11-28 DIAGNOSIS — R739 Hyperglycemia, unspecified: Secondary | ICD-10-CM

## 2018-11-28 DIAGNOSIS — I4891 Unspecified atrial fibrillation: Secondary | ICD-10-CM | POA: Insufficient documentation

## 2018-11-28 DIAGNOSIS — Z85828 Personal history of other malignant neoplasm of skin: Secondary | ICD-10-CM | POA: Insufficient documentation

## 2018-11-28 DIAGNOSIS — N3001 Acute cystitis with hematuria: Secondary | ICD-10-CM | POA: Insufficient documentation

## 2018-11-28 DIAGNOSIS — Z79899 Other long term (current) drug therapy: Secondary | ICD-10-CM | POA: Diagnosis not present

## 2018-11-28 DIAGNOSIS — K625 Hemorrhage of anus and rectum: Secondary | ICD-10-CM | POA: Diagnosis present

## 2018-11-28 DIAGNOSIS — E1165 Type 2 diabetes mellitus with hyperglycemia: Secondary | ICD-10-CM | POA: Diagnosis not present

## 2018-11-28 LAB — COMPREHENSIVE METABOLIC PANEL
ALT: 17 U/L (ref 0–44)
AST: 22 U/L (ref 15–41)
Albumin: 2.6 g/dL — ABNORMAL LOW (ref 3.5–5.0)
Alkaline Phosphatase: 116 U/L (ref 38–126)
Anion gap: 13 (ref 5–15)
BUN: 28 mg/dL — ABNORMAL HIGH (ref 8–23)
CO2: 20 mmol/L — ABNORMAL LOW (ref 22–32)
Calcium: 8.5 mg/dL — ABNORMAL LOW (ref 8.9–10.3)
Chloride: 101 mmol/L (ref 98–111)
Creatinine, Ser: 1.52 mg/dL — ABNORMAL HIGH (ref 0.44–1.00)
GFR calc Af Amer: 36 mL/min — ABNORMAL LOW (ref >60)
GFR calc non Af Amer: 31 mL/min — ABNORMAL LOW (ref >60)
Glucose, Bld: 178 mg/dL — ABNORMAL HIGH (ref 70–99)
Potassium: 4.4 mmol/L (ref 3.5–5.1)
Sodium: 134 mmol/L — ABNORMAL LOW (ref 135–145)
Total Bilirubin: 0.7 mg/dL (ref 0.3–1.2)
Total Protein: 6.3 g/dL — ABNORMAL LOW (ref 6.5–8.1)

## 2018-11-28 LAB — CBC
HCT: 30.8 % — ABNORMAL LOW (ref 36.0–46.0)
Hemoglobin: 9.6 g/dL — ABNORMAL LOW (ref 12.0–15.0)
MCH: 33.1 pg (ref 26.0–34.0)
MCHC: 31.2 g/dL (ref 30.0–36.0)
MCV: 106.2 fL — ABNORMAL HIGH (ref 80.0–100.0)
Platelets: 280 10*3/uL (ref 150–400)
RBC: 2.9 MIL/uL — ABNORMAL LOW (ref 3.87–5.11)
RDW: 13.5 % (ref 11.5–15.5)
WBC: 8.2 10*3/uL (ref 4.0–10.5)
nRBC: 0 % (ref 0.0–0.2)

## 2018-11-28 LAB — URINALYSIS, ROUTINE W REFLEX MICROSCOPIC
Bilirubin Urine: NEGATIVE
Glucose, UA: NEGATIVE mg/dL
Ketones, ur: NEGATIVE mg/dL
Nitrite: NEGATIVE
Protein, ur: 100 mg/dL — AB
RBC / HPF: >50 RBC/hpf — ABNORMAL HIGH (ref 0–5)
Specific Gravity, Urine: 1.012 (ref 1.005–1.030)
WBC, UA: >50 WBC/hpf — ABNORMAL HIGH (ref 0–5)
pH: 7 (ref 5.0–8.0)

## 2018-11-28 LAB — POC OCCULT BLOOD, ED: Fecal Occult Bld: NEGATIVE

## 2018-11-28 MED ORDER — CIPROFLOXACIN HCL 500 MG PO TABS: 500.0000 mg | ORAL_TABLET | Freq: Two times a day (BID) | ORAL | 0 refills | Status: AC

## 2018-11-28 MED ORDER — CIPROFLOXACIN IN D5W 400 MG/200ML IV SOLN
400.0000 mg | Freq: Once | INTRAVENOUS | Status: AC
  Administered 2018-11-28: 400 mg via INTRAVENOUS
  Filled 2018-11-28: qty 200

## 2018-11-28 MED ORDER — CIPROFLOXACIN HCL 500 MG PO TABS: 500.0000 mg | ORAL_TABLET | Freq: Two times a day (BID) | ORAL | 0 refills | Status: DC

## 2018-11-28 MED ORDER — IOHEXOL 300 MG/ML  SOLN
100.0000 mL | Freq: Once | INTRAMUSCULAR | Status: AC | PRN
  Administered 2018-11-28: 100 mL via INTRAVENOUS

## 2018-11-28 MED ORDER — FLUCONAZOLE 200 MG PO TABS
200.0000 mg | ORAL_TABLET | Freq: Once | ORAL | Status: AC
  Administered 2018-11-28: 200 mg via ORAL
  Filled 2018-11-28: qty 1

## 2018-11-28 MED ORDER — SODIUM CHLORIDE 0.9 % IV BOLUS
500.0000 mL | Freq: Once | INTRAVENOUS | Status: AC
  Administered 2018-11-28: 500 mL via INTRAVENOUS

## 2018-11-28 NOTE — ED Notes (Signed)
Pt is tearful and confused. Believes her family abandoned her. Pt is trying to wander the waiting room. Family called and asked to wait with pt. Family is here now.

## 2018-11-28 NOTE — Discharge Instructions (Signed)
Take the antibiotics as prescribed.  Follow-up with PCP in 3 days.  Return for new, worsening symptoms to include vomiting, severe pain, fevers or chills.

## 2018-11-28 NOTE — ED Triage Notes (Signed)
Pt arrives back to ED with c/o of rectal bleeding since 8/25 when she was seen in eD for same. Pt states she is still having bright red bleeding with lower abd pain. Pt is alert and ox4.

## 2018-11-28 NOTE — ED Provider Notes (Signed)
[facility] EMERGENCY DEPARTMENT Provider Note   CSN: [id number] Arrival date & time: 11/28/18  1102   History   Chief Complaint Chief Complaint  Patient presents with   Rectal Bleeding   HPI Brenda [name] is a 85 y.o. female with past medical history significant for anemia, diabetes, GERD, hypertension, hyperlipidemia, seizures, chronic anticoagulation who presents for evaluation of bleeding.  Patient's daughter in room provides majority of history.  They have known patient has had persistent hematuria since she was treated for UTI proximally 1 week ago.  Has also had persistent lower abdominal pain.  He also noticed blood after she uses the commode and they were unsure if it is rectal or vaginal bleeding.  Patient states she has bowel movements approximately 1-2 times daily.  Family has not noticed any fevers, chills, altered mental status.  She has not any coughing, rashes or lesions.  Family states she has had normal p.o. intake at home.  She is at baseline mentation.  She is on Eliquis for anticoagulation.  He is also had some mild dysuria.  Denies additional aggravating or alleviating factors.  Has not taken anything for pain.  Per chart review UTI on 8/25 with multiple species a suggested recollection.  She was prescribed Macrobid at that time.  History obtained from patient and past medical records.  No interpreter was used.     HPI  Past Medical History:  Diagnosis Date   Anemia    Arthritis    Basal cell carcinoma    nose   Diabetes mellitus without complication ([place])    pt denies diabetes   GERD (gastroesophageal reflux disease)    Hiatal hernia    HLD (hyperlipidemia)    Hypertension    Seizure ([place]) 2011    Patient Active Problem List   Diagnosis Date Noted   Swollen leg 09/27/2018   Black stool 07/19/2018   Constipation 07/16/2018   Melena 99991111   Lichen sclerosus 99991111   Grief reaction 01/19/2018   Acute  pain of left shoulder 07/27/2017   GERD (gastroesophageal reflux disease) 07/18/2017   Gastrointestinal hemorrhage    Symptomatic anemia 07/17/2017   Iron deficiency anemia 05/09/2017   Osteoporosis 11/24/2016   Skin cancer of nose 11/23/2016   Detrusor instability 11/23/2016   Paroxysmal A-fib ([place]) 08/05/2016   Essential hypertension 05/05/2016   Type 2 diabetes mellitus without complication, without long-term current use of insulin ([place]) 05/05/2016   Hiatal hernia 05/05/2016   History of CVA (cerebrovascular accident) without residual deficits 05/05/2016   Memory change 05/05/2016   AICD (automatic cardioverter/defibrillator) present 05/05/2016   Back pain 05/05/2016    Past Surgical History:  Procedure Laterality Date   CARDIAC DEFIBRILLATOR PLACEMENT     CATARACT EXTRACTION Left 02/2001   CHOLECYSTECTOMY     COLONOSCOPY WITH PROPOFOL N/A 07/19/2017   Procedure: COLONOSCOPY WITH PROPOFOL;  Surgeon: Jerene Bears, MD;  Location: [facility] ENDOSCOPY;  Service: Gastroenterology;  Laterality: N/A;   ESOPHAGOGASTRODUODENOSCOPY (EGD) WITH PROPOFOL N/A 07/19/2017   Procedure: ESOPHAGOGASTRODUODENOSCOPY (EGD) WITH PROPOFOL;  Surgeon: Jerene Bears, MD;  Location: [facility] ENDOSCOPY;  Service: Gastroenterology;  Laterality: N/A;   LUMBAR LAMINECTOMY  06/11/2001   LUMBAR LAMINECTOMY  02/09/2006   LUMBAR LAMINECTOMY  07/07/2006   with spianal cord stimulator   SPINAL CORD STIMULATOR REMOVAL  14/2009   TUBAL LIGATION       OB History   No obstetric history on file.      Home Medications  Prior to Admission medications   Medication Sig Start Date End Date Taking? Authorizing Provider  acetaminophen (TYLENOL) 500 MG tablet Take 500-1,000 mg by mouth every 6 (six) hours as needed for mild pain.    [provider]  atorvastatin (LIPITOR) 40 MG tablet TAKE 1 TABLET(40 MG) BY MOUTH DAILY 10/31/18   Camnitz, Ocie Doyne, MD  bisacodyl (DULCOLAX) 5 MG EC tablet Take  1 tablet (5 mg total) by mouth daily as needed for moderate constipation. 07/24/18   Armbruster, Carlota Raspberry, MD  Cholecalciferol (VITAMIN D-3) 1000 units CAPS Take 1,000 Units by mouth daily.    [provider]  ciprofloxacin (CIPRO) 500 MG tablet Take 1 tablet (500 mg total) by mouth every 12 (twelve) hours for 7 days. 11/28/18 12/05/18  Hassen Bruun A, PA-C  ferrous sulfate 325 (65 FE) MG tablet Take 325 mg by mouth 3 (three) times a week. Monday, Wed, Fri.    [provider]  furosemide (LASIX) 20 MG tablet Take 1 tablet (20 mg total) by mouth daily as needed. 10/08/18   Camnitz, Ocie Doyne, MD  metoprolol tartrate (LOPRESSOR) 100 MG tablet Take 1 tablet (100 mg total) by mouth 2 (two) times daily. 10/31/18   Camnitz, Ocie Doyne, MD  Multiple Vitamins-Minerals (ICAPS AREDS 2 PO) Take 1 capsule by mouth 2 (two) times daily.     [provider]  omeprazole (PRILOSEC) 40 MG capsule Take 1 capsule (40 mg total) by mouth 2 (two) times daily. Take before breakfast and dinner. 07/24/18   Armbruster, Carlota Raspberry, MD  ondansetron (ZOFRAN ODT) 4 MG disintegrating tablet Take 1 tablet (4 mg total) by mouth every 8 (eight) hours as needed for nausea or vomiting. 11/20/18   Albrizze, Verline Lema E, PA-C  Propylene Glycol (SYSTANE COMPLETE) 0.6 % SOLN Place 1 drop into both eyes daily.    [provider]  Rivaroxaban (XARELTO) 15 MG TABS tablet Take 1 tablet (15 mg total) by mouth daily with supper. 04/20/18   Camnitz, Ocie Doyne, MD  tolterodine (DETROL LA) 2 MG 24 hr capsule TAKE 1 CAPSULE(2 MG) BY MOUTH DAILY Patient taking differently: Take 2 mg by mouth daily.  04/19/18   Glenis Smoker, MD  verapamil (CALAN-SR) 240 MG CR tablet Take 1 tablet (240 mg total) by mouth at bedtime. 04/23/18   Camnitz, Ocie Doyne, MD  sucralfate (CARAFATE) 1 GM/10ML suspension Take 10 mLs (1 g total) by mouth every 6 (six) hours as needed. Patient not taking: Reported on 07/27/2018 07/24/18 09/28/18   Armbruster, Carlota Raspberry, MD    Family History Family History  Problem Relation Age of Onset   Diabetes Mother    Heart failure Father    Breast cancer Sister    Heart failure Sister    Diabetes Son    Diabetes Sister    Colon cancer Neg Hx    Stomach cancer Neg Hx    Rectal cancer Neg Hx    Esophageal cancer Neg Hx    Social History Social History   Tobacco Use   Smoking status: Never Smoker   Smokeless tobacco: Never Used  Substance Use Topics   Alcohol use: No   Drug use: No    Allergies   Sotalol, Erythromycin, Augmentin [amoxicillin-pot clavulanate], Crestor [rosuvastatin calcium], Keflex [cephalexin], and Septra [sulfamethoxazole-trimethoprim]  Review of Systems Review of Systems  Constitutional: Negative.   HENT: Negative.   Respiratory: Negative.   Cardiovascular: Negative.   Gastrointestinal: Positive for abdominal pain and blood in stool. Negative  for abdominal distention, anal bleeding, constipation, diarrhea, nausea, rectal pain and vomiting.  Genitourinary: Positive for dysuria and hematuria.  Musculoskeletal: Negative.   Skin: Negative.   Neurological: Negative.   All other systems reviewed and are negative.    Physical Exam Updated Vital Signs BP 124/64    Pulse 83    Temp 98.8 F (37.1 C) (Oral)    Resp 18    SpO2 91%   Physical Exam Vitals signs and nursing note reviewed.  Constitutional:      General: She is not in acute distress.    Appearance: She is well-developed. She is not ill-appearing, toxic-appearing or diaphoretic.  HENT:     Head: Normocephalic and atraumatic.     Nose: Nose normal.     Mouth/Throat:     Mouth: Mucous membranes are moist.     Pharynx: Oropharynx is clear.  Eyes:     Pupils: Pupils are equal, round, and reactive to light.  Neck:     Musculoskeletal: Normal range of motion.  Cardiovascular:     Rate and Rhythm: Normal rate.     Pulses: Normal pulses.     Heart sounds: Normal heart sounds.    Pulmonary:     Effort: Pulmonary effort is normal. No respiratory distress.     Breath sounds: Normal breath sounds.  Abdominal:     General: Bowel sounds are normal. There is no distension.     Palpations: Abdomen is soft.     Tenderness: There is abdominal tenderness in the right lower quadrant, suprapubic area and left lower quadrant. There is no right CVA tenderness, left CVA tenderness, guarding or rebound.     Hernia: No hernia is present.     Comments: Mild tenderness palpation to diffuse lower quadrant.  No rebound or guarding.  No evidence abdominal wall skin changes. No abd wall herniations.  Musculoskeletal: Normal range of motion.     Comments: Is all 4 extremities without difficulty.  Skin:    General: Skin is warm and dry.  Neurological:     Mental Status: She is alert.    ED Treatments / Results  Labs (all labs ordered are listed, but only abnormal results are displayed) Labs Reviewed  COMPREHENSIVE METABOLIC PANEL - Abnormal; Notable for the following components:      Result Value   Sodium 134 (*)    CO2 20 (*)    Glucose, Bld 178 (*)    BUN 28 (*)    Creatinine, Ser 1.52 (*)    Calcium 8.5 (*)    Total Protein 6.3 (*)    Albumin 2.6 (*)    GFR calc non Af Amer 31 (*)    GFR calc Af Amer 36 (*)    All other components within normal limits  CBC - Abnormal; Notable for the following components:   RBC 2.90 (*)    Hemoglobin 9.6 (*)    HCT 30.8 (*)    MCV 106.2 (*)    All other components within normal limits  URINALYSIS, ROUTINE W REFLEX MICROSCOPIC - Abnormal; Notable for the following components:   Color, Urine AMBER (*)    APPearance CLOUDY (*)    Hgb urine dipstick LARGE (*)    Protein, ur 100 (*)    Leukocytes,Ua SMALL (*)    RBC / HPF >50 (*)    WBC, UA >50 (*)    Bacteria, UA MANY (*)    All other components within normal limits  URINE CULTURE  POC  OCCULT BLOOD, ED    EKG EKG Interpretation  Date/Time:  Wednesday November 28 2018  21:15:53 EDT Ventricular Rate:  79 PR Interval:    QRS Duration: 89 QT Interval:  412 QTC Calculation: 473 R Axis:   -24 Text Interpretation:  Sinus rhythm Low voltage, precordial leads Abnormal R-wave progression, late transition LVH by voltage No significant change since last tracing Confirmed by Deno Etienne [phone]) on 11/28/2018 9:20:42 PM   Radiology Ct Abdomen Pelvis W Contrast  Result Date: 11/28/2018 CLINICAL DATA:  Abdominal pain EXAM: CT ABDOMEN AND PELVIS WITH CONTRAST TECHNIQUE: Multidetector CT imaging of the abdomen and pelvis was performed using the standard protocol following bolus administration of intravenous contrast. CONTRAST:  186mL OMNIPAQUE IOHEXOL 300 MG/ML  SOLN COMPARISON:  None. FINDINGS: Lower chest: Lung bases demonstrate no acute consolidation or effusion. Partially visualized intracardiac pacing leads. Heart size is normal. Moderate to large hiatal hernia. Hepatobiliary: No focal liver abnormality is seen. Status post cholecystectomy. No biliary dilatation. Pancreas: Very atrophic.  No inflammatory changes. Spleen: Normal in size without focal abnormality. Adrenals/Urinary Tract: Adrenal glands are within normal limits. Mild right hydronephrosis and hydroureter without definitive stone seen. Slightly thick-walled appearance of the urinary bladder Stomach/Bowel: Stomach nonenlarged. No dilated small bowel. Probable duodenal diverticulum. No bowel wall thickening. Sigmoid colon diverticula without acute inflammatory change. Appendix not well identified but no right lower quadrant inflammatory process Vascular/Lymphatic: Nonaneurysmal aorta. Moderate aortic atherosclerosis. Subcentimeter lymph nodes adjacent to the right: Reproductive: Uterus and bilateral adnexa are unremarkable. Other: Negative for free air or free fluid Musculoskeletal: Posterior spinal rods and fixating screws L2 through S1. Treated compression fracture at T12. Old left inferior pubic ramus fracture. Bone  defect in the right posterior iliac bone felt surgical. IMPRESSION: 1. Mild right hydronephrosis and hydroureter, but no ureteral stone identified. 2. Moderate hiatal hernia 3. Sigmoid colon diverticular disease without acute inflammatory change Electronically Signed   By: Donavan Foil M.D.   On: 11/28/2018 22:16   Procedures Procedures (including critical care time)  Medications Ordered in ED Medications  sodium chloride 0.9 % bolus 500 mL (0 mLs Intravenous Stopped 11/28/18 2248)  ciprofloxacin (CIPRO) IVPB 400 mg (0 mg Intravenous Stopped 11/28/18 2248)  iohexol (OMNIPAQUE) 300 MG/ML solution 100 mL (100 mLs Intravenous Contrast Given 11/28/18 2150)  fluconazole (DIFLUCAN) tablet 200 mg (200 mg Oral Given 11/28/18 2256)   Initial Impression / Assessment and Plan / ED Course  I have reviewed the triage vital signs and the nursing notes.  Pertinent labs & imaging results that were available during my care of the patient were reviewed by me and considered in my medical decision making (see chart for details).  85 year old female appears otherwise well presents for evaluation of persistent hematuria.  Was diagnosed 1 week with UTI and prescribed Macrobid.  Afebrile, nonseptic, non-ill-appearing.  She still has 3 days left of her Macrobid at home.  Abdomen soft with generalized tenderness palpation to lower quadrants.  Rectal exam with good tone.  Light brown hard stool in vault however no impaction.  Does have some mild irritation to the labia, consistent with yeast. Given diflucan. Family was unsure if she also had blood in her stool however her occult here is negative. Labs appear at baseline.  Appears overall well.  CBC without leukocytosis, hemoglobin stable.  Metabolic panel at baseline.   Urinalysis with blood, positive for infection.  Will culture.  Patient has not want anything for pain at this time.    CT scan  with hydroureter without evidence of stone.    EKG without ST/T changes.  She  was given IV Cipro in ED due to allergy to penicillins.  Patient also given small fluid bolus.  Has been able to tolerated p.o. intake without difficulty. Shared decision making for recurrent UTI despite outpatient abx. Family would like trial of additional abx outpatient. Discussed follow up with PCP or to return to ED for new or worsening symptoms..  Patient is nontoxic, nonseptic appearing, in no apparent distress.  Patient's pain and other symptoms adequately managed in emergency department.  Fluid bolus given.  Labs, imaging and vitals reviewed.  Patient does not meet the SIRS or Sepsis criteria.  On repeat exam patient does not have a surgical abdomin and there are no peritoneal signs.  No indication of appendicitis, bowel obstruction, bowel perforation, cholecystitis, diverticulitis.   The patient has been appropriately medically screened and/or stabilized in the ED. I have low suspicion for any other emergent medical condition which would require further screening, evaluation or treatment in the ED or require inpatient management.  Patient is hemodynamically stable and in no acute distress.  Patient able to ambulate in department prior to ED.  Evaluation does not show acute pathology that would require ongoing or additional emergent interventions while in the emergency department or further inpatient treatment.  I have discussed the diagnosis with the patient and answered all questions.  Pain is been managed while in the emergency department and patient has no further complaints prior to discharge.  Patient is comfortable with plan discussed in room and is stable for discharge at this time.  I have discussed strict return precautions for returning to the emergency department.  Patient was encouraged to follow-up with PCP/specialist refer to at discharge.  Patient has been seen and evaluated by attending physician, Dr. Tyrone Nine with agrees with above treatment, plan and disposition. .     Final  Clinical Impressions(s) / ED Diagnoses   Final diagnoses:  Acute cystitis with hematuria  Blood glucose elevated    ED Discharge Orders         Ordered    ciprofloxacin (CIPRO) 500 MG tablet  Every 12 hours,   Status:  Discontinued     11/28/18 2232    ciprofloxacin (CIPRO) 500 MG tablet  Every 12 hours     11/28/18 2244           Leverne Amrhein A, PA-C 11/28/18 2306    Deno Etienne, DO 11/28/18 2309

## 2018-12-01 LAB — URINE CULTURE: Culture: >=100000 — AB

## 2018-12-02 ENCOUNTER — Telehealth: Payer: Self-pay

## 2018-12-02 NOTE — Telephone Encounter (Signed)
Post ED Visit - Positive Culture Follow-up  Culture report reviewed by antimicrobial stewardship pharmacist: [facility] Team []  Elenor Quinones, Pharm.D. []  Heide Guile, Pharm.D., BCPS AQ-ID []  Parks Neptune, Pharm.D., BCPS []  Alycia Rossetti, Pharm.D., BCPS []  [place], Pharm.D., BCPS, AAHIVP []  Legrand Como, Pharm.D., BCPS, AAHIVP []  Salome Arnt, PharmD, BCPS []  Johnnette Gourd, PharmD, BCPS [x]  Hughes Better, PharmD, BCPS []  Leeroy Cha, PharmD []  Laqueta Linden, PharmD, BCPS []  Albertina Parr, PharmD  [facility] Team []  Leodis Sias, PharmD []  Lindell Spar, PharmD []  Royetta Asal, PharmD []  Graylin Shiver, Rph []  Rema Fendt) Glennon Mac, PharmD []  Arlyn Dunning, PharmD []  Netta Cedars, PharmD []  Dia Sitter, PharmD []  Leone Haven, PharmD []  Gretta Arab, PharmD []  Theodis Shove, PharmD []  Peggyann Juba, PharmD []  Reuel Boom, PharmD   Positive urine culture Treated with Cipro, organism sensitive to the same and no further patient follow-up is required at this time.  Genia Del 12/02/2018, 10:43 AM

## 2018-12-31 ENCOUNTER — Other Ambulatory Visit: Payer: Self-pay

## 2018-12-31 ENCOUNTER — Other Ambulatory Visit: Payer: Self-pay | Admitting: Cardiology

## 2018-12-31 DIAGNOSIS — I480 Paroxysmal atrial fibrillation: Secondary | ICD-10-CM

## 2018-12-31 NOTE — Telephone Encounter (Signed)
Xarelto 15mg  refill request received; pt is 85 years old, weight- 60.3kg, Crea-1.52 on 11/28/2018, last seen by Dr. Curt Bears on 10/04/2018, Diagnosis-Afib, CrCl-25.65ml/min; Dose is appropriate based on dosing criteria. Will send in refill to requested pharmacy.

## 2019-01-01 ENCOUNTER — Other Ambulatory Visit: Payer: Self-pay

## 2019-01-01 ENCOUNTER — Observation Stay (HOSPITAL_COMMUNITY)
Admission: EM | Admit: 2019-01-01 | Discharge: 2019-01-03 | Disposition: A | Discharge status: Home / Self Care | Payer: Medicare Other | Attending: Family Medicine | Admitting: Family Medicine

## 2019-01-01 ENCOUNTER — Encounter (HOSPITAL_COMMUNITY): Payer: Self-pay | Admitting: Emergency Medicine

## 2019-01-01 ENCOUNTER — Emergency Department (HOSPITAL_COMMUNITY): Payer: Medicare Other

## 2019-01-01 ENCOUNTER — Observation Stay (HOSPITAL_COMMUNITY): Payer: Medicare Other

## 2019-01-01 ENCOUNTER — Ambulatory Visit (INDEPENDENT_AMBULATORY_CARE_PROVIDER_SITE_OTHER): Payer: Medicare Other | Admitting: Family Medicine

## 2019-01-01 VITALS — BP 110/60 | HR 128 | Ht 61.0 in | Wt 123.0 lb

## 2019-01-01 DIAGNOSIS — I700 Atherosclerosis of aorta: Secondary | ICD-10-CM | POA: Diagnosis not present

## 2019-01-01 DIAGNOSIS — M810 Age-related osteoporosis without current pathological fracture: Secondary | ICD-10-CM | POA: Insufficient documentation

## 2019-01-01 DIAGNOSIS — Z95810 Presence of automatic (implantable) cardiac defibrillator: Secondary | ICD-10-CM | POA: Insufficient documentation

## 2019-01-01 DIAGNOSIS — R2689 Other abnormalities of gait and mobility: Secondary | ICD-10-CM | POA: Insufficient documentation

## 2019-01-01 DIAGNOSIS — D509 Iron deficiency anemia, unspecified: Secondary | ICD-10-CM | POA: Diagnosis not present

## 2019-01-01 DIAGNOSIS — R0602 Shortness of breath: Secondary | ICD-10-CM | POA: Diagnosis present

## 2019-01-01 DIAGNOSIS — I480 Paroxysmal atrial fibrillation: Secondary | ICD-10-CM | POA: Insufficient documentation

## 2019-01-01 DIAGNOSIS — J9811 Atelectasis: Secondary | ICD-10-CM | POA: Diagnosis not present

## 2019-01-01 DIAGNOSIS — Z634 Disappearance and death of family member: Secondary | ICD-10-CM | POA: Insufficient documentation

## 2019-01-01 DIAGNOSIS — Z9114 Patient's other noncompliance with medication regimen: Secondary | ICD-10-CM | POA: Diagnosis not present

## 2019-01-01 DIAGNOSIS — R64 Cachexia: Secondary | ICD-10-CM | POA: Diagnosis not present

## 2019-01-01 DIAGNOSIS — H9190 Unspecified hearing loss, unspecified ear: Secondary | ICD-10-CM | POA: Insufficient documentation

## 2019-01-01 DIAGNOSIS — R000 Tachycardia, unspecified: Secondary | ICD-10-CM | POA: Diagnosis not present

## 2019-01-01 DIAGNOSIS — Z85828 Personal history of other malignant neoplasm of skin: Secondary | ICD-10-CM | POA: Insufficient documentation

## 2019-01-01 DIAGNOSIS — J9601 Acute respiratory failure with hypoxia: Secondary | ICD-10-CM | POA: Diagnosis not present

## 2019-01-01 DIAGNOSIS — Z9181 History of falling: Secondary | ICD-10-CM | POA: Insufficient documentation

## 2019-01-01 DIAGNOSIS — R0902 Hypoxemia: Secondary | ICD-10-CM | POA: Diagnosis present

## 2019-01-01 DIAGNOSIS — E785 Hyperlipidemia, unspecified: Secondary | ICD-10-CM | POA: Insufficient documentation

## 2019-01-01 DIAGNOSIS — Z6824 Body mass index (BMI) 24.0-24.9, adult: Secondary | ICD-10-CM | POA: Diagnosis not present

## 2019-01-01 DIAGNOSIS — Z7901 Long term (current) use of anticoagulants: Secondary | ICD-10-CM | POA: Insufficient documentation

## 2019-01-01 DIAGNOSIS — Z880 Allergy status to penicillin: Secondary | ICD-10-CM | POA: Insufficient documentation

## 2019-01-01 DIAGNOSIS — N1832 Chronic kidney disease, stage 3b: Secondary | ICD-10-CM | POA: Diagnosis not present

## 2019-01-01 DIAGNOSIS — Z888 Allergy status to other drugs, medicaments and biological substances status: Secondary | ICD-10-CM | POA: Insufficient documentation

## 2019-01-01 DIAGNOSIS — Z79899 Other long term (current) drug therapy: Secondary | ICD-10-CM | POA: Insufficient documentation

## 2019-01-01 DIAGNOSIS — K219 Gastro-esophageal reflux disease without esophagitis: Secondary | ICD-10-CM | POA: Diagnosis not present

## 2019-01-01 DIAGNOSIS — I1310 Hypertensive heart and chronic kidney disease without heart failure, with stage 1 through stage 4 chronic kidney disease, or unspecified chronic kidney disease: Secondary | ICD-10-CM | POA: Diagnosis not present

## 2019-01-01 DIAGNOSIS — N3281 Overactive bladder: Secondary | ICD-10-CM | POA: Diagnosis not present

## 2019-01-01 DIAGNOSIS — I083 Combined rheumatic disorders of mitral, aortic and tricuspid valves: Secondary | ICD-10-CM | POA: Insufficient documentation

## 2019-01-01 DIAGNOSIS — Z8249 Family history of ischemic heart disease and other diseases of the circulatory system: Secondary | ICD-10-CM | POA: Insufficient documentation

## 2019-01-01 DIAGNOSIS — E441 Mild protein-calorie malnutrition: Secondary | ICD-10-CM | POA: Diagnosis not present

## 2019-01-01 DIAGNOSIS — I088 Other rheumatic multiple valve diseases: Secondary | ICD-10-CM | POA: Diagnosis not present

## 2019-01-01 DIAGNOSIS — Z8673 Personal history of transient ischemic attack (TIA), and cerebral infarction without residual deficits: Secondary | ICD-10-CM

## 2019-01-01 DIAGNOSIS — Z881 Allergy status to other antibiotic agents status: Secondary | ICD-10-CM | POA: Insufficient documentation

## 2019-01-01 DIAGNOSIS — Z66 Do not resuscitate: Secondary | ICD-10-CM | POA: Insufficient documentation

## 2019-01-01 DIAGNOSIS — Z20828 Contact with and (suspected) exposure to other viral communicable diseases: Secondary | ICD-10-CM | POA: Insufficient documentation

## 2019-01-01 DIAGNOSIS — K449 Diaphragmatic hernia without obstruction or gangrene: Secondary | ICD-10-CM | POA: Diagnosis not present

## 2019-01-01 DIAGNOSIS — I2510 Atherosclerotic heart disease of native coronary artery without angina pectoris: Secondary | ICD-10-CM | POA: Insufficient documentation

## 2019-01-01 DIAGNOSIS — I482 Chronic atrial fibrillation, unspecified: Secondary | ICD-10-CM

## 2019-01-01 HISTORY — DX: Unspecified atrial fibrillation: I48.91

## 2019-01-01 LAB — CBC WITH DIFFERENTIAL/PLATELET
Abs Immature Granulocytes: 0.07 10*3/uL (ref 0.00–0.07)
Basophils Absolute: 0 10*3/uL (ref 0.0–0.1)
Basophils Relative: 0 %
Eosinophils Absolute: 0 10*3/uL (ref 0.0–0.5)
Eosinophils Relative: 0 %
HCT: 34 % — ABNORMAL LOW (ref 36.0–46.0)
Hemoglobin: 11.1 g/dL — ABNORMAL LOW (ref 12.0–15.0)
Immature Granulocytes: 1 %
Lymphocytes Relative: 19 %
Lymphs Abs: 2 10*3/uL (ref 0.7–4.0)
MCH: 33.9 pg (ref 26.0–34.0)
MCHC: 32.6 g/dL (ref 30.0–36.0)
MCV: 104 fL — ABNORMAL HIGH (ref 80.0–100.0)
Monocytes Absolute: 0.8 10*3/uL (ref 0.1–1.0)
Monocytes Relative: 8 %
Neutro Abs: 7.6 10*3/uL (ref 1.7–7.7)
Neutrophils Relative %: 72 %
Platelets: 277 10*3/uL (ref 150–400)
RBC: 3.27 MIL/uL — ABNORMAL LOW (ref 3.87–5.11)
RDW: 13.8 % (ref 11.5–15.5)
WBC: 10.6 10*3/uL — ABNORMAL HIGH (ref 4.0–10.5)
nRBC: 0 % (ref 0.0–0.2)

## 2019-01-01 LAB — COMPREHENSIVE METABOLIC PANEL
ALT: 16 U/L (ref 0–44)
AST: 26 U/L (ref 15–41)
Albumin: 3.3 g/dL — ABNORMAL LOW (ref 3.5–5.0)
Alkaline Phosphatase: 119 U/L (ref 38–126)
Anion gap: 14 (ref 5–15)
BUN: 21 mg/dL (ref 8–23)
CO2: 20 mmol/L — ABNORMAL LOW (ref 22–32)
Calcium: 9.1 mg/dL (ref 8.9–10.3)
Chloride: 100 mmol/L (ref 98–111)
Creatinine, Ser: 1.43 mg/dL — ABNORMAL HIGH (ref 0.44–1.00)
GFR calc Af Amer: 39 mL/min — ABNORMAL LOW (ref >60)
GFR calc non Af Amer: 33 mL/min — ABNORMAL LOW (ref >60)
Glucose, Bld: 128 mg/dL — ABNORMAL HIGH (ref 70–99)
Potassium: 4.4 mmol/L (ref 3.5–5.1)
Sodium: 134 mmol/L — ABNORMAL LOW (ref 135–145)
Total Bilirubin: 0.9 mg/dL (ref 0.3–1.2)
Total Protein: 7.3 g/dL (ref 6.5–8.1)

## 2019-01-01 LAB — URINALYSIS, ROUTINE W REFLEX MICROSCOPIC
Bilirubin Urine: NEGATIVE
Glucose, UA: NEGATIVE mg/dL
Hgb urine dipstick: NEGATIVE
Ketones, ur: NEGATIVE mg/dL
Nitrite: NEGATIVE
Protein, ur: NEGATIVE mg/dL
Specific Gravity, Urine: 1.01 (ref 1.005–1.030)
pH: 5 (ref 5.0–8.0)

## 2019-01-01 LAB — SARS CORONAVIRUS 2 BY RT PCR (HOSPITAL ORDER, PERFORMED IN ~~LOC~~ HOSPITAL LAB): SARS Coronavirus 2: NEGATIVE

## 2019-01-01 LAB — BRAIN NATRIURETIC PEPTIDE: B Natriuretic Peptide: 229.4 pg/mL — ABNORMAL HIGH (ref 0.0–100.0)

## 2019-01-01 LAB — TROPONIN I (HIGH SENSITIVITY)
Troponin I (High Sensitivity): 15 ng/L (ref <18)
Troponin I (High Sensitivity): 15 ng/L

## 2019-01-01 LAB — PROTIME-INR
INR: 2.6 — ABNORMAL HIGH (ref 0.8–1.2)
Prothrombin Time: 27.1 seconds — ABNORMAL HIGH (ref 11.4–15.2)

## 2019-01-01 LAB — LACTIC ACID, PLASMA
Lactic Acid, Venous: 2.5 mmol/L (ref 0.5–1.9)
Lactic Acid, Venous: 1.4 mmol/L (ref 0.5–1.9)

## 2019-01-01 LAB — APTT: aPTT: 37 s — ABNORMAL HIGH (ref 24–36)

## 2019-01-01 MED ORDER — SODIUM CHLORIDE 0.9 % IV BOLUS
1000.0000 mL | Freq: Once | INTRAVENOUS | Status: AC
  Administered 2019-01-01: 1000 mL via INTRAVENOUS

## 2019-01-01 MED ORDER — METOPROLOL TARTRATE 25 MG PO TABS
100.0000 mg | ORAL_TABLET | Freq: Once | ORAL | Status: AC
  Administered 2019-01-01: 100 mg via ORAL
  Filled 2019-01-01: qty 4

## 2019-01-01 MED ORDER — IOHEXOL 350 MG/ML SOLN
60.0000 mL | Freq: Once | INTRAVENOUS | Status: AC | PRN
  Administered 2019-01-01: 60 mL via INTRAVENOUS

## 2019-01-01 NOTE — ED Provider Notes (Addendum)
[place] EMERGENCY DEPARTMENT Provider Note   CSN: [id number] Arrival date & time: 01/01/19  [place]     History   Chief Complaint Chief Complaint  Patient presents with  . Tachycardia  . Shortness of Breath    HPI Brenda [name] is a 85 y.o. female past medical history significant for paroxysmal A. fib, anemia, hyperlipidemia, hypertension, [place] defibrillator, seizures presents emergency department today with chief complaint of tachycardia and shortness of breath. She is on xarelto and reports compliance.  Patient was at her primary care doctor today and when she had her vitals checked her heart rate was in the 130s and her O2 was 82% on room air.  Patient does not wear oxygen at baseline. Patient's daughter is at the bedside and is contributing historian.  She states her mother has been reportedly not feeling well for the last 4 days.  She has a home health nurse that checks on her daily.  Per the nurse the patient has not been wanting to get out of bed lately and has had decreased appetite.  She has become increasingly short of breath since symptom onset.  Patient also has been out of her metoprolol tartrate for the last 5 days but did not let her daughter know until today. Patient denies any fever, chills, chest pain, cough, congestion, abdominal pain, nausea, vomiting, urinary symptoms, diarrhea.  She denies any recent fall.  Denies any sick contacts or known contact with someone positive for COVID-19.History provided by patient with additional history obtained from chart review.       Past Medical History:  Diagnosis Date  . A-fib ([place])   . Anemia   . Arthritis   . Basal cell carcinoma    nose  . Diabetes mellitus without complication ([place])    pt denies diabetes  . GERD (gastroesophageal reflux disease)   . Hiatal hernia   . HLD (hyperlipidemia)   . Hypertension   . Seizure [facility]) 2011    Patient Active Problem List   Diagnosis Date Noted  .  Hypoxia 01/01/2019  . Swollen leg 09/27/2018  . Black stool 07/19/2018  . Constipation 07/16/2018  . Melena 07/16/2018  . Lichen sclerosus 99991111  . Grief reaction 01/19/2018  . GERD (gastroesophageal reflux disease) 07/18/2017  . Gastrointestinal hemorrhage   . Symptomatic anemia 07/17/2017  . Iron deficiency anemia 05/09/2017  . Osteoporosis 11/24/2016  . Skin cancer of nose 11/23/2016  . 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 Paroxysmal A-fib ([place]) 08/05/2016  . Essential hypertension 05/05/2016  . Type 2 diabetes mellitus without complication, without long-term current use of insulin ([place]) 05/05/2016  . Hiatal hernia 05/05/2016  . History of CVA (cerebrovascular accident) without residual deficits 05/05/2016  . Memory change 05/05/2016  . AICD (automatic cardioverter/defibrillator) present 05/05/2016  . Back pain 05/05/2016    Past Surgical History:  Procedure Laterality Date  . CARDIAC DEFIBRILLATOR PLACEMENT    . CARDIAC DEFIBRILLATOR PLACEMENT    . CATARACT EXTRACTION Left 02/2001  . CHOLECYSTECTOMY    . COLONOSCOPY WITH PROPOFOL N/A 07/19/2017   Procedure: COLONOSCOPY WITH PROPOFOL;  Surgeon: Jerene Bears, MD;  Location: [facility];  Service: Gastroenterology;  Laterality: N/A;  . ESOPHAGOGASTRODUODENOSCOPY (EGD) WITH PROPOFOL N/A 07/19/2017   Procedure: ESOPHAGOGASTRODUODENOSCOPY (EGD) WITH PROPOFOL;  Surgeon: Jerene Bears, MD;  Location: [facility] ENDOSCOPY;  Service: Gastroenterology;  Laterality: N/A;  . LUMBAR LAMINECTOMY  06/11/2001  . LUMBAR LAMINECTOMY  02/09/2006  . LUMBAR LAMINECTOMY  07/07/2006   with spianal cord  stimulator  . SPINAL CORD STIMULATOR REMOVAL  14/2009  . TUBAL LIGATION       OB History   No obstetric history on file.      Home Medications    Prior to Admission medications   Medication Sig Start Date End Date Taking? Authorizing Provider  acetaminophen (TYLENOL) 500 MG tablet Take 500-1,000 mg by mouth every 6 (six) hours as needed for  mild pain.    [provider]  atorvastatin (LIPITOR) 40 MG tablet TAKE 1 TABLET(40 MG) BY MOUTH DAILY 10/31/18   Camnitz, Ocie Doyne, MD  bisacodyl (DULCOLAX) 5 MG EC tablet Take 1 tablet (5 mg total) by mouth daily as needed for moderate constipation. 07/24/18   Armbruster, Carlota Raspberry, MD  Cholecalciferol (VITAMIN D-3) 1000 units CAPS Take 1,000 Units by mouth daily.    [provider]  ferrous sulfate 325 (65 FE) MG tablet Take 325 mg by mouth 3 (three) times a week. Monday, Wed, Fri.    [provider]  furosemide (LASIX) 20 MG tablet Take 1 tablet (20 mg total) by mouth daily as needed. 10/08/18   Camnitz, Ocie Doyne, MD  metoprolol tartrate (LOPRESSOR) 100 MG tablet Take 1 tablet (100 mg total) by mouth 2 (two) times daily. 10/31/18   Camnitz, Ocie Doyne, MD  Multiple Vitamins-Minerals (ICAPS AREDS 2 PO) Take 1 capsule by mouth 2 (two) times daily.     [provider]  omeprazole (PRILOSEC) 40 MG capsule Take 1 capsule (40 mg total) by mouth 2 (two) times daily. Take before breakfast and dinner. 07/24/18   Armbruster, Carlota Raspberry, MD  ondansetron (ZOFRAN ODT) 4 MG disintegrating tablet Take 1 tablet (4 mg total) by mouth every 8 (eight) hours as needed for nausea or vomiting. 11/20/18   Laddie Naeem, Verline Lema E, PA-C  Propylene Glycol (SYSTANE COMPLETE) 0.6 % SOLN Place 1 drop into both eyes daily.    [provider]  tolterodine (DETROL LA) 2 MG 24 hr capsule TAKE 1 CAPSULE(2 MG) BY MOUTH DAILY Patient taking differently: Take 2 mg by mouth daily.  04/19/18   Glenis Smoker, MD  verapamil (CALAN-SR) 240 MG CR tablet Take 1 tablet (240 mg total) by mouth at bedtime. 04/23/18   Camnitz, Will Hassell Done, MD  XARELTO 15 MG TABS tablet TAKE 1 TABLET DAILY WITH SUPPER 12/31/18   Camnitz, Ocie Doyne, MD  sucralfate (CARAFATE) 1 GM/10ML suspension Take 10 mLs (1 g total) by mouth every 6 (six) hours as needed. Patient not taking: Reported on 07/27/2018 07/24/18 09/28/18   Armbruster, Carlota Raspberry, MD    Family History Family History  Problem Relation Age of Onset  . Diabetes Mother   . Heart failure Father   . Breast cancer Sister   . Heart failure Sister   . Diabetes Son   . Diabetes Sister   . Colon cancer Neg Hx   . Stomach cancer Neg Hx   . Rectal cancer Neg Hx   . Esophageal cancer Neg Hx     Social History Social History   Tobacco Use  . Smoking status: Never Smoker  . Smokeless tobacco: Never Used  Substance Use Topics  . Alcohol use: No  . Drug use: No     Allergies   Sotalol, Erythromycin, Augmentin [amoxicillin-pot clavulanate], Crestor [rosuvastatin calcium], Keflex [cephalexin], and Septra [sulfamethoxazole-trimethoprim]   Review of Systems Review of Systems  Constitutional: Positive for appetite change and fatigue. Negative for chills, fever and unexpected weight change.  HENT: Negative for  congestion, ear discharge, ear pain, sinus pressure, sinus pain and sore throat.   Eyes: Negative for pain and redness.  Respiratory: Positive for shortness of breath. Negative for cough.   Cardiovascular: Negative for chest pain.  Gastrointestinal: Negative for abdominal pain, constipation, diarrhea, nausea and vomiting.  Genitourinary: Negative for dysuria and hematuria.  Musculoskeletal: Negative for back pain and neck pain.  Skin: Negative for wound.  Neurological: Negative for weakness, numbness and headaches.     Physical Exam Updated Vital Signs BP (!) 139/112 (BP Location: Right Arm)   Pulse (!) 145   Temp 97.6 F (36.4 C) (Oral)   Resp 16   Ht 5' (1.524 m)   Wt 55.8 kg   SpO2 97%   BMI 24.02 kg/m   Physical Exam Vitals signs and nursing note reviewed.  Constitutional:      General: She is not in acute distress.    Appearance: She is not ill-appearing.  HENT:     Head: Normocephalic and atraumatic.     Right Ear: Tympanic membrane and external ear normal.     Left Ear: Tympanic membrane and external ear normal.      Nose: Nose normal.     Mouth/Throat:     Mouth: Mucous membranes are moist.     Pharynx: Oropharynx is clear.  Eyes:     General: No scleral icterus.       Right eye: No discharge.        Left eye: No discharge.     Extraocular Movements: Extraocular movements intact.     Conjunctiva/sclera: Conjunctivae normal.     Pupils: Pupils are equal, round, and reactive to light.  Neck:     Musculoskeletal: Normal range of motion.     Vascular: No JVD.  Cardiovascular:     Rate and Rhythm: Regular rhythm. Tachycardia present.     Pulses: Normal pulses.          Radial pulses are 2+ on the right side and 2+ on the left side.     Heart sounds: Normal heart sounds.  Pulmonary:     Effort: Tachypnea present. No accessory muscle usage.     Breath sounds: Normal breath sounds.     Comments: Lungs clear to auscultation in all fields with symmetric chest rise.  Patient is hypoxic to 85% on room air, improved to 95% on 3L [place].  She is speaking in full sentences, no accessory muscle use, no stridor or tripoding.  She is intermittently tachypneic, mild respiratory distress. Abdominal:     Comments: Abdomen is soft, non-distended, and non-tender in all quadrants. No rigidity, no guarding. No peritoneal signs.  Musculoskeletal: Normal range of motion.     Right lower leg: No edema.     Left lower leg: No edema.  Skin:    General: Skin is warm and dry.     Capillary Refill: Capillary refill takes less than 2 seconds.  Neurological:     Mental Status: She is oriented to person, place, and time.     GCS: GCS eye subscore is 4. GCS verbal subscore is 5. GCS motor subscore is 6.     Comments: Fluent speech, no facial droop.  Psychiatric:        Behavior: Behavior normal.      ED Treatments / Results  Labs (all labs ordered are listed, but only abnormal results are displayed) Labs Reviewed  LACTIC ACID, PLASMA - Abnormal; Notable for the following components:  Result Value   Lactic Acid,  Venous 2.5 (*)    All other components within normal limits  COMPREHENSIVE METABOLIC PANEL - Abnormal; Notable for the following components:   Sodium 134 (*)    CO2 20 (*)    Glucose, Bld 128 (*)    Creatinine, Ser 1.43 (*)    Albumin 3.3 (*)    GFR calc non Af Amer 33 (*)    GFR calc Af Amer 39 (*)    All other components within normal limits  CBC WITH DIFFERENTIAL/PLATELET - Abnormal; Notable for the following components:   WBC 10.6 (*)    RBC 3.27 (*)    Hemoglobin 11.1 (*)    HCT 34.0 (*)    MCV 104.0 (*)    All other components within normal limits  APTT - Abnormal; Notable for the following components:   aPTT 37 (*)    All other components within normal limits  PROTIME-INR - Abnormal; Notable for the following components:   Prothrombin Time 27.1 (*)    INR 2.6 (*)    All other components within normal limits  BRAIN NATRIURETIC PEPTIDE - Abnormal; Notable for the following components:   B Natriuretic Peptide 229.4 (*)    All other components within normal limits  SARS CORONAVIRUS 2 (HOSPITAL ORDER, [place] [facility])  CULTURE, BLOOD (ROUTINE X 2)  CULTURE, BLOOD (ROUTINE X 2)  URINE CULTURE  LACTIC ACID, PLASMA  URINALYSIS, ROUTINE W REFLEX MICROSCOPIC  TSH  TROPONIN I (HIGH SENSITIVITY)  TROPONIN I (HIGH SENSITIVITY)    EKG EKG Interpretation  Date/Time:  Tuesday January 01 2019 16:55:33 EDT Ventricular Rate:  140 PR Interval:    QRS Duration: 80 QT Interval:  353 QTC Calculation: 539 R Axis:   -20 Text Interpretation:  Junctional tachycardia Borderline left axis deviation Nonspecific T abnormalities, lateral leads Prolonged QT interval No significant change since last tracing Confirmed by Deno Etienne [phone]) on 01/01/2019 7:08:44 PM   Radiology Dg Chest Port 1 View  Result Date: 01/01/2019 CLINICAL DATA:  Shortness of breath, hypoxia EXAM: PORTABLE CHEST 1 VIEW COMPARISON:  09/28/2018 FINDINGS: Lungs are clear. No pleural effusion or  pneumothorax. The heart is normal in size. Left subclavian ICD. Moderate hiatal hernia. Old right posterior 5th rib fracture deformity. Lumbar spine fixation hardware, incompletely visualized. IMPRESSION: No evidence of acute cardiopulmonary disease. Electronically Signed   By: Julian Hy M.D.   On: 01/01/2019 17:35    Procedures .Critical Care Performed by: Cherre Robins, PA-C Authorized by: Cherre Robins, PA-C   Critical care provider statement:    Critical care time (minutes):  37   Critical care time was exclusive of:  Separately billable procedures and treating other patients and teaching time   Critical care was necessary to treat or prevent imminent or life-threatening deterioration of the following conditions:  Respiratory failure   Critical care was time spent personally by me on the following activities:  Development of treatment plan with patient or surrogate, discussions with primary provider, evaluation of patient's response to treatment, examination of patient, obtaining history from patient or surrogate, ordering and performing treatments and interventions, ordering and review of laboratory studies, ordering and review of radiographic studies, pulse oximetry, re-evaluation of patient's condition and review of old charts   (including critical care time)  Medications Ordered in ED Medications  metoprolol tartrate (LOPRESSOR) tablet 100 mg (100 mg Oral Given 01/01/19 1754)  sodium chloride 0.9 % bolus 1,000 mL (1,000 mLs  Intravenous New Bag/Given 01/01/19 1848)     Initial Impression / Assessment and Plan / ED Course  I have reviewed the triage vital signs and the nursing notes.  Pertinent labs & imaging results that were available during my care of the patient were reviewed by me and considered in my medical decision making (see chart for details).  Patient seen and examined.  Patient was at an appointment with family medicine clinic just prior to arrival  where she was found to be hypoxic to 83% tachycardic with a heart rate in the 130s.  Her EKG here shows junctional tachycardia, no evidence of A. fib.  On my exam patient is wearing nasal cannula 3 L with an O2 saturation of 95%.  She continues to be tachycardic to 136 and is intermittently tachypneic.  Her blood pressure is located 139/112.  Patient's daughter at the bedside reports compliance with her Xarelto, we feel this unlikely be PE.  Patient has been out of her Lopressor for the last 4 days possibly contributing to her elevated blood pressures today. Labs today show CBC with very mild leukocytosis of 10.6.  Hemoglobin of 11.1 seems to be close to her baseline on chart review.  CMP with creatinine of 1.43 again close to patient's baseline.  Lactic acid ptosis of 2.5, IV fluids given.  Second lactic is within normal range.  BNP of 229.4.  COVID swab is negative.  Chest x-ray viewed by me is without signs of infiltrate or infection.  Home dose of Lopressor given with slight improvement in blood pressure.  She has a history of urinary tract infections, UA ordered.  Blood cultures are also pending.  First troponin is 15, second is pending. This case was discussed with Dr. Tyrone Nine  who has seen the patient and agrees with plan to admit for hypoxia. Spoke with family medicine service who agrees to assume care of patient and bring into the hospital for further evaluation and management.    CTA chest ordered to evaluate for possible PE as cause of hypoxia at the request of family medicine service.  They will follow results.      Portions of this note were generated with [employer]. Dictation errors may occur despite best attempts at proofreading.    Final Clinical Impressions(s) / ED Diagnoses   Final diagnoses:  Hypoxia    ED Discharge Orders    None       Cherre Robins, PA-C 01/01/19 2017    Grethel Zenk, Harley Hallmark, PA-C 01/01/19 2018    Deno Etienne, DO 01/01/19 2027

## 2019-01-01 NOTE — Assessment & Plan Note (Addendum)
Patient's heart rate in the 130s. Does appear to be in regular rhythm.  Patient with AICD in place. Daughter states that Brenda [name] has not had new prescription of metoprolol. She has been out of this medication completely for the past 4 days. She normally takes metoprolol tartrate 100 mg twice daily. Patient is currently not having any chest pain, shortness of breath. EKG obtained in office with sinus tachycardia.  Patient escorted to the ED for further evaluation.

## 2019-01-01 NOTE — ED Triage Notes (Signed)
Pt went to MD office today for feeling SOB/ gittery all weekend. At the office, they noticed her HR 140's, O2 83% on room air. Pt sent here from MD office.

## 2019-01-01 NOTE — Assessment & Plan Note (Addendum)
Patient hypoxic to 83% here in office, although overall well-appearing.  Patient wheeled over to the emergency room for further evaluation.  She is not having any active respiratory symptoms, denies any shortness of breath or chest pain.  No history of lung disease.  No cough, no known contacts to COVID-19.    Unable to place patient on oxygen in the office prior to transportation to the ED however did accompany patient and she was well-appearing throughout trip and on arrival to the ED where she was immediately triaged.

## 2019-01-01 NOTE — ED Notes (Signed)
Pt placed on hospital bed

## 2019-01-01 NOTE — H&P (Addendum)
[facility] Admission History and Physical Service Pager: [phone]  Patient name: Brenda [name] Medical record number: [id number] Date of birth: 1931-09-21 Age: 85 y.o. Gender: female  Primary Care Provider: Benay Pike, MD Consultants: cardiology Code Status: DNR (confirmed with daughter and previous PCP notes) Preferred Emergency Contact: [name] (daughter) -cell [phone] hm [phone]  Chief Complaint: low oxygen saturations in clinic   Assessment and Plan: Brenda [name] is a 85 y.o. female presenting from clinic with SOB and palpitations consistent with acute hypoxic respiratory failure. PMH is significant for afib (Xarelto), seizures, HTN, HLD, GERD, anemia and arthritis.   Acute Hypoxic Respiratory Failure  Patient reports malaise over the weekend and found to have hypoxia in clinic as low as 83%. Hypoxia improved to 99%  with 3L via [place] in ED. Now satting 90-100 on 3L, baseline no O2 use. Per daughter's report, respiratory status appears to have been progressively worsening over the weekend but was unable to see PCP till Tuesday. Patient with negative CTA for PE. CXR showed clear lungs and no cardiomegaly.  Significant labs showed elevated lactic acid of 2.5 that improved to 1.4 after IVF in addition to elevated BNP of 229. Troponins neg and trended at 15>15.  Unclear etiology for patient's recent onset of hypoxic respiratory failure. Could consider cardiac causes given patient's CT findings of cardiomegaly with left atrial enlargement along with mild aortic valve calcification. Could consider echocardiogram to evaluate for potential new onset CHF in setting of elevated BNP but less likely given no evidence of extensive fluid overload on physical exam and only minimal increase in BNP. Can consider SOB 2/2 tachycardia, however, tachycardia has improved with metoprolol while hypoxia did not improve. CXR wnl, but can consider possible early  pneumonia that has not presented on imaging yet. CT did show atelectasis so can consider this as possible source. Wells score 6 (h/o sedentary lifestyle, tachycardia with hypoxia), however compliant on Xarelto and CTA negative. COVID negative on admission. Likely 2/2 tachycardia from missing metoprolol dose.  Will monitor CBC for any signs of infectious process as potential contributor to patient's hypoxia but given CXR without evidence of infiltrates it is less likely pulmonary cause.  -admit to medical tele, attending Dr. Andria Frames  -continuous cardiac monitoring -continuous pulse oximetry -vitals per floor protocol  -supplemental Oxygen PRN, wean as tolerated  -PT/OT for evaluation  -Tylenol 650mg  PRN  -incentive spirometry -can consider outpatient PFTs   -obtain echo  Tachycardia with h/o Atrial Fibrillation   HR 128 on presentation, improved to 70 with metoprolol. Patient has been out of home meds for 3-4 days now. EKG showing junctional tachycardia. Tachycardia likely 2/2 medication non compliance. Patient on home Xarelto 15mg , also on home Lopressor 100mg  BID. Will evaluate TSH as patient presenting with tachycardia. Given recent worsening renal function, as well as age and weight can consider switching to coumadin.  -continue home Xarelto  -will monitor patient on tele while admitted  -TSH - AM EKG  CKD IIIb Patient with recent worsening renal function. Appears to be at baseline. GFR 33 and Cr 1.43.  -avoid nephrotoxic drugs -consider switching Xarelto to Coumadin as described above  Recent h/o falls Per patient's daughter, patient has been falling more frequently. Because of that is mainly sedentary now. Unclear if patient has assistive device.  -fall precautions -PT/OT eval   HTN Patient on home Metoprolol 100mg  BID and verapamil 240 mg nightly.  BP in ED initially elevated at 139/112 but improved to 130/67.   -  continue home metoprolol and verapamil  -continue to monitor with  vitals   HLD Patient on home Atorvastatin 40mg  daily  -continue home meds  GERD Patient on home omeprazole 40mg  BID.  -Will start protonix while inpatient, 80mg .   Anemia Patient with reported history of anemia per chart review. Last hemoglobin of 11.1. Previous hemoglobin ranges from 9.6-10.5. Will continue to monitor for any decrease in hemoglobin.  -CBC in AM   ?Hx of T2DM without use of insulin Patient's chart shows history of DM but comments that patient denies DM. Glucose is elevated at 128 on initially lab values upon admission. Previously on Januvia, Last hemoglobin A1C per chart review is 5.7 in 2018 and Januvia was discontinued at that time. Patient has no record of current diabetes medication.   -Will recheck hemoglobin A1c.   -AM BMP   Overactive Bladder Patient on home Detrol 2mg  daily -Will substitute with Toviaz 4mg  while inpatient.  FEN/GI: heart healthy diet  Prophylaxis: Lovenox 40  Disposition: admit to medical tele floor   History of Present Illness:  Brenda [name] is a 85 y.o. female presenting with palpitations and hypoxic respiratory failure.  Patient is hard of hearing and normally wears hearing aids. Batteries are dead at the time of interview, per patient's daughter so her daughter provides most of history for this encounter. Patient's daughter reports that the patient had reported "feeling bad" all weekend. Patient's daughter states that her mother was out of her lopressor all weekend due to confusion with [facility] scripts being sent to pharmacy. Last lopressor use was Saturday AM. Attempted to get same day appointment with [facility] but next available appointment was on 10/6. During this appointment, patient was found to have hypoxia to 83% and was tachycardic 103. Patient was then advised to report to the ED for further evaluation.   On review of systems, patient reports SOB with exertion and does report intermittent HA, none today. Patient also reports  needing new glasses, so she has been experiencing blurry vision for some time. Daughter states that her mother as a h/o mulitple falls since April of 2020 and has been very sedentary since then.   ED Course:  In the ED, patient received 100 mg of Lopressor and 1 liter bolus of NS. Her BP improved from 139/112 to most recent BP 130/67 and tachycardia improved from 145 to 83. CXR was unremarkable and CTA chest was negative for PE. LA was initially 2.5 and decreased to 1.4, U/A with rare bacteria and small leukocytes, urine cultures obtained. Troponins unremarkable at 15 with elevated BNP of 229.4.    Review Of Systems: Per HPI with the following additions:   Review of Systems  Constitutional: Negative for chills and fever.  HENT: Positive for hearing loss.   Eyes: Negative for blurred vision.  Respiratory: Positive for shortness of breath.   Cardiovascular: Positive for palpitations.  Gastrointestinal: Negative for abdominal pain and vomiting.  Neurological: Negative for headaches.    Patient Active Problem List   Diagnosis Date Noted  . Hypoxia 01/01/2019  . Swollen leg 09/27/2018  . Black stool 07/19/2018  . Constipation 07/16/2018  . Melena 07/16/2018  . Lichen sclerosus 99991111  . Grief reaction 01/19/2018  . GERD (gastroesophageal reflux disease) 07/18/2017  . Gastrointestinal hemorrhage   . Symptomatic anemia 07/17/2017  . Iron deficiency anemia 05/09/2017  . Osteoporosis 11/24/2016  . Skin cancer of nose 11/23/2016  . 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 Paroxysmal A-fib ([place]) 08/05/2016  . Essential hypertension 05/05/2016  .  Type 2 diabetes mellitus without complication, without long-term current use of insulin ([place]) 05/05/2016  . Hiatal hernia 05/05/2016  . History of CVA (cerebrovascular accident) without residual deficits 05/05/2016  . Memory change 05/05/2016  . AICD (automatic cardioverter/defibrillator) present 05/05/2016  . Back pain 05/05/2016    Past  Medical History: Past Medical History:  Diagnosis Date  . A-fib ([place])   . Anemia   . Arthritis   . Basal cell carcinoma    nose  . Diabetes mellitus without complication ([place])    pt denies diabetes  . GERD (gastroesophageal reflux disease)   . Hiatal hernia   . HLD (hyperlipidemia)   . Hypertension   . Seizure ([place]) 2011    Past Surgical History: Past Surgical History:  Procedure Laterality Date  . CARDIAC DEFIBRILLATOR PLACEMENT    . CARDIAC DEFIBRILLATOR PLACEMENT    . CATARACT EXTRACTION Left 02/2001  . CHOLECYSTECTOMY    . COLONOSCOPY WITH PROPOFOL N/A 07/19/2017   Procedure: COLONOSCOPY WITH PROPOFOL;  Surgeon: Jerene Bears, MD;  Location: [place];  Service: Gastroenterology;  Laterality: N/A;  . ESOPHAGOGASTRODUODENOSCOPY (EGD) WITH PROPOFOL N/A 07/19/2017   Procedure: ESOPHAGOGASTRODUODENOSCOPY (EGD) WITH PROPOFOL;  Surgeon: Jerene Bears, MD;  Location: [facility];  Service: Gastroenterology;  Laterality: N/A;  . LUMBAR LAMINECTOMY  06/11/2001  . LUMBAR LAMINECTOMY  02/09/2006  . LUMBAR LAMINECTOMY  07/07/2006   with spianal cord stimulator  . SPINAL CORD STIMULATOR REMOVAL  14/2009  . TUBAL LIGATION      Social History: Social History   Tobacco Use  . Smoking status: Never Smoker  . Smokeless tobacco: Never Used  Substance Use Topics  . Alcohol use: No  . Drug use: No    Family History: Family History  Problem Relation Age of Onset  . Diabetes Mother   . Heart failure Father   . Breast cancer Sister   . Heart failure Sister   . Diabetes Son   . Diabetes Sister   . Colon cancer Neg Hx   . Stomach cancer Neg Hx   . Rectal cancer Neg Hx   . Esophageal cancer Neg Hx     Allergies and Medications: Allergies  Allergen Reactions  . Sotalol Other (See Comments)    Seizures   . Erythromycin Nausea Only  . Augmentin [Amoxicillin-Pot Clavulanate] Rash  . Crestor [Rosuvastatin Calcium] Rash  . Keflex [Cephalexin] Rash  . Septra  [Sulfamethoxazole-Trimethoprim] Rash   No current facility-administered medications on file prior to encounter.    Current Outpatient Medications on File Prior to Encounter  Medication Sig Dispense Refill  . acetaminophen (TYLENOL) 500 MG tablet Take 500-1,000 mg by mouth every 6 (six) hours as needed for mild pain.    Marland Kitchen atorvastatin (LIPITOR) 40 MG tablet TAKE 1 TABLET(40 MG) BY MOUTH DAILY (Patient taking differently: Take 40 mg by mouth daily. ) 90 tablet 3  . ferrous sulfate 325 (65 FE) MG tablet Take 325 mg by mouth 3 (three) times a week. Monday, Wed, Fri.    . furosemide (LASIX) 20 MG tablet Take 1 tablet (20 mg total) by mouth daily as needed. 30 tablet 9  . metoprolol tartrate (LOPRESSOR) 100 MG tablet Take 1 tablet (100 mg total) by mouth 2 (two) times daily. 180 tablet 3  . omeprazole (PRILOSEC) 40 MG capsule Take 1 capsule (40 mg total) by mouth 2 (two) times daily. Take before breakfast and dinner. 180 capsule 3  . Propylene Glycol (SYSTANE COMPLETE) 0.6 % SOLN Place 1  drop into both eyes daily.    Marland Kitchen tolterodine (DETROL LA) 2 MG 24 hr capsule TAKE 1 CAPSULE(2 MG) BY MOUTH DAILY (Patient taking differently: Take 2 mg by mouth daily. ) 90 capsule 1  . verapamil (CALAN-SR) 240 MG CR tablet Take 1 tablet (240 mg total) by mouth at bedtime. 90 tablet 2  . VITAMIN D PO Take 1 capsule by mouth daily.    Alveda Reasons 15 MG TABS tablet TAKE 1 TABLET DAILY WITH SUPPER (Patient taking differently: Take 15 mg by mouth daily with supper. ) 90 tablet 2  . bisacodyl (DULCOLAX) 5 MG EC tablet Take 1 tablet (5 mg total) by mouth daily as needed for moderate constipation. (Patient not taking: Reported on 01/01/2019) 90 tablet 1  . ondansetron (ZOFRAN ODT) 4 MG disintegrating tablet Take 1 tablet (4 mg total) by mouth every 8 (eight) hours as needed for nausea or vomiting. (Patient not taking: Reported on 01/01/2019) 8 tablet 0  . [DISCONTINUED] sucralfate (CARAFATE) 1 GM/10ML suspension Take 10 mLs (1 g  total) by mouth every 6 (six) hours as needed. (Patient not taking: Reported on 07/27/2018) 2700 mL 1    Objective: BP 130/67   Pulse 83   Temp 97.6 F (36.4 C) (Oral)   Resp 18   Ht 5' (1.524 m)   Wt 55.8 kg   SpO2 100%   BMI 24.02 kg/m   Exam: General: pale appearing elderly female in NAD, sitting up in bed, Hard of hearing and does not have hearing aids in place  Eyes: no scleral icterus of conjunctival injection  Cardiovascular: no murmurs appreciated, RRR without friction rubs or gallops  Respiratory: CTAB without wheezing nor crackles, patient breathing comfortably on 3 Liters via [place]  Gastrointestinal: soft, NT, bowel sounds present  MSK: patient able to move extremities with normal range of motion  Derm: no lesions or rashes appreciated, patient overall pale complexion  Neuro: alert and oriented, able to follow commands, and answer questions appropriately   Labs and Imaging: CBC BMET  Recent Labs  Lab 01/01/19 1710  WBC 10.6*  HGB 11.1*  HCT 34.0*  PLT 277   Recent Labs  Lab 01/01/19 1710  NA 134*  K 4.4  CL 100  CO2 20*  BUN 21  CREATININE 1.43*  GLUCOSE 128*  CALCIUM 9.1      Ref. Range 01/01/2019 17:10 01/01/2019 17:12 01/01/2019 19:30  Troponin I (High Sensitivity) Latest Ref Range: <18 ng/L 15  15    Ref. Range 01/01/2019 17:12  B Natriuretic Peptide Latest Ref Range: 0.0 - 100.0 pg/mL 229.4 (H)   EKG: tachycardia   Ct Angio Chest Pe W And/or Wo Contrast  Result Date: 01/01/2019 CLINICAL DATA:  85 year old with three-day history of shortness of breath, found to be tachycardic and hypoxemic at her primary provider's office earlier today. EXAM: CT ANGIOGRAPHY CHEST WITH CONTRAST TECHNIQUE: Multidetector CT imaging of the chest was performed using the standard protocol during bolus administration of intravenous contrast. Multiplanar CT image reconstructions and MIPs were obtained to evaluate the vascular anatomy. CONTRAST:  72mL OMNIPAQUE IOHEXOL 350 MG/ML  IV. COMPARISON:  None. FINDINGS: Cardiovascular: Contrast opacification of the pulmonary arteries is very good. No filling defects within either main pulmonary artery or their segmental branches in either lung to suggest pulmonary embolism. Heart mildly to moderately enlarged with LEFT atrial enlargement in particular. Pacemaker lead tips in the RIGHT atrial appendage and at the RV apex. Moderate to severe three-vessel coronary atherosclerosis. Mild aortic  valvular calcification. Dense mitral annular calcification. No pericardial effusion. Moderate atherosclerosis involving the thoracic and proximal abdominal aorta without evidence of aneurysm. Mediastinum/Nodes: Large hiatal hernia. Normal appearing gas-filled esophagus. No pathologic lymphadenopathy. Normal-appearing thyroid gland. Lungs/Pleura: Dependent atelectasis posteriorly in both lungs. No confluent or ground-glass airspace consolidation. No parenchymal nodules or masses. No pleural effusions. Central airways patent with calcified tracheobronchial cartilages and mild bronchial wall thickening. Upper Abdomen: Surgically absent gallbladder. Atrophic pancreas. Diverticulosis involving the visualized descending colon. No acute findings. Musculoskeletal: Exaggeration of the usual thoracic kyphosis. Osseous demineralization. Prior T12 vertebral body augmentation. Degenerative disc disease and spondylosis involving the lower cervical spine. Degenerative disc disease throughout the thoracic spine. Remote sternal fracture. No acute findings. Review of the MIP images confirms the above findings. IMPRESSION: 1. No evidence of pulmonary embolism. 2. Dependent atelectasis posteriorly in both lungs. No acute cardiopulmonary disease otherwise. 3. Cardiomegaly with LEFT atrial enlargement. Three-vessel coronary atherosclerosis. Mild aortic valvular calcification. 4. Large hiatal hernia. Aortic Atherosclerosis (ICD10-I70.0). Electronically Signed   By: Evangeline Dakin M.D.    On: 01/01/2019 21:02   Dg Chest Port 1 View  Result Date: 01/01/2019 CLINICAL DATA:  Shortness of breath, hypoxia EXAM: PORTABLE CHEST 1 VIEW COMPARISON:  09/28/2018 FINDINGS: Lungs are clear. No pleural effusion or pneumothorax. The heart is normal in size. Left subclavian ICD. Moderate hiatal hernia. Old right posterior 5th rib fracture deformity. Lumbar spine fixation hardware, incompletely visualized. IMPRESSION: No evidence of acute cardiopulmonary disease. Electronically Signed   By: Julian Hy M.D.   On: 01/01/2019 17:35    Stark Klein, MD 01/02/2019, 1:50 AM PGY-1, [place] Intern pager: [phone], text pages welcome   FPTS Upper-Level Resident Addendum   I have independently interviewed and examined the patient. I have discussed the above with the original author and agree with their documentation. My edits for correction/addition/clarification are in blue. Please see also any attending notes.   Caroline More, DO PGY-3, [facility] 01/02/2019 2:49 AM  FPTS Service pager: [phone] (text pages welcome through [facility])

## 2019-01-01 NOTE — Progress Notes (Addendum)
Subjective:  Patient ID: Elynn Bertch  DOB: 1931-04-13 MRN: [id number]  Chaela Quintela is a 85 y.o. female with a PMH of T2DM, paroxysmal A. fib on Xarelto, anemia, H/O CVA, GERD, hypertension, s/p AICD placement, here today for generalized fatigue.   HPI:  Generalized fatigue: -Patient states that overall she is not feeling well.  She says she does not know exactly what is happening. -She states that overall her body feels "nervous" on the inside but not on the outside.  She states she is having some low back pain and feels like her left side is "running away from her".   -Daughter states she has been out of her Lopressor for a few days because it was not sent to her mail-in pharmacy. -Daughter thinks that her defibrillator may have gone off, they have not been notified of this. -States that she was previously treated for UTIs back in early September and since then feels as if her UTI has cleared up.  She denies any dysuria.  They deny any fevers.  States that she has had chills periodically. -she denies any chest pain, shortness of breath -She denies any palpitations.    ROS: As mentioned in HPI  Social hx: Denies use of illicit drugs, alcohol use Smoking status reviewed  Patient Active Problem List   Diagnosis Date Noted  . Hypoxia 01/01/2019  . Swollen leg 09/27/2018  . Black stool 07/19/2018  . Constipation 07/16/2018  . Melena 07/16/2018  . Lichen sclerosus 99991111  . Grief reaction 01/19/2018  . GERD (gastroesophageal reflux disease) 07/18/2017  . Gastrointestinal hemorrhage   . Symptomatic anemia 07/17/2017  . Iron deficiency anemia 05/09/2017  . Osteoporosis 11/24/2016  . Skin cancer of nose 11/23/2016  . 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 Paroxysmal A-fib ([place]) 08/05/2016  . Essential hypertension 05/05/2016  . Type 2 diabetes mellitus without complication, without long-term current use of insulin ([place]) 05/05/2016  . Hiatal hernia 05/05/2016  . History  of CVA (cerebrovascular accident) without residual deficits 05/05/2016  . Memory change 05/05/2016  . AICD (automatic cardioverter/defibrillator) present 05/05/2016  . Back pain 05/05/2016     Objective:  BP 110/60   Pulse (!) 128   Ht 5\' 1"  (1.549 m)   Wt 123 lb (55.8 kg)   SpO2 (!) 83%   BMI 23.24 kg/m   Vitals and nursing note reviewed  General: NAD, pleasant, in wheelchair Pulm: normal effort, CTAB Cardiac: Tachycardic, regular rhythm, normal heart sounds, no m/r/g Extremities: no edema or cyanosis. WWP. Skin: warm and dry, no rashes noted Neuro: alert and oriented, no focal deficits Psych: normal affect, normal thought content, tearful regarding being sent to ED  Assessment & Plan:   Hypoxia Patient hypoxic to 83% here in office, although overall well-appearing.  Patient wheeled over to the emergency room for further evaluation.  She is not having any active respiratory symptoms, denies any shortness of breath or chest pain.  No history of lung disease.  No cough, no known contacts to COVID-19.    Unable to place patient on oxygen in the office prior to transportation to the ED however did accompany patient and she was well-appearing throughout trip and on arrival to the ED where she was immediately triaged.   Paroxysmal A-fib (HCC) Patient's heart rate in the 130s. Does appear to be in regular rhythm.  Patient with AICD in place. Daughter states that [place] has not had new prescription of metoprolol. She has been out of this medication completely for the  past 4 days. She normally takes metoprolol tartrate 100 mg twice daily. Patient is currently not having any chest pain, shortness of breath. EKG obtained in office with sinus tachycardia.  Patient escorted to the ED for further evaluation.   [place] Yannis Gumbs, DO Family Medicine Resident PGY-3

## 2019-01-02 ENCOUNTER — Other Ambulatory Visit: Payer: Self-pay

## 2019-01-02 ENCOUNTER — Other Ambulatory Visit: Payer: Self-pay | Admitting: Gastroenterology

## 2019-01-02 ENCOUNTER — Observation Stay (HOSPITAL_COMMUNITY): Payer: Medicare Other

## 2019-01-02 ENCOUNTER — Ambulatory Visit (HOSPITAL_COMMUNITY)
Admission: RE | Admit: 2019-01-02 | Discharge: 2019-01-02 | Disposition: A | Discharge status: Home / Self Care | Payer: Medicare Other | Source: Ambulatory Visit | Attending: Family Medicine | Admitting: Family Medicine

## 2019-01-02 ENCOUNTER — Observation Stay (HOSPITAL_BASED_OUTPATIENT_CLINIC_OR_DEPARTMENT_OTHER): Payer: Medicare Other

## 2019-01-02 DIAGNOSIS — R0602 Shortness of breath: Secondary | ICD-10-CM | POA: Insufficient documentation

## 2019-01-02 DIAGNOSIS — Z66 Do not resuscitate: Secondary | ICD-10-CM | POA: Diagnosis not present

## 2019-01-02 DIAGNOSIS — R0902 Hypoxemia: Secondary | ICD-10-CM | POA: Diagnosis not present

## 2019-01-02 DIAGNOSIS — F4329 Adjustment disorder with other symptoms: Secondary | ICD-10-CM

## 2019-01-02 DIAGNOSIS — I361 Nonrheumatic tricuspid (valve) insufficiency: Secondary | ICD-10-CM | POA: Diagnosis not present

## 2019-01-02 DIAGNOSIS — Z20828 Contact with and (suspected) exposure to other viral communicable diseases: Secondary | ICD-10-CM | POA: Diagnosis not present

## 2019-01-02 DIAGNOSIS — I480 Paroxysmal atrial fibrillation: Secondary | ICD-10-CM | POA: Diagnosis not present

## 2019-01-02 DIAGNOSIS — F4321 Adjustment disorder with depressed mood: Secondary | ICD-10-CM | POA: Insufficient documentation

## 2019-01-02 DIAGNOSIS — I482 Chronic atrial fibrillation, unspecified: Secondary | ICD-10-CM | POA: Insufficient documentation

## 2019-01-02 DIAGNOSIS — I10 Essential (primary) hypertension: Secondary | ICD-10-CM | POA: Insufficient documentation

## 2019-01-02 DIAGNOSIS — I340 Nonrheumatic mitral (valve) insufficiency: Secondary | ICD-10-CM | POA: Diagnosis not present

## 2019-01-02 DIAGNOSIS — R000 Tachycardia, unspecified: Secondary | ICD-10-CM | POA: Insufficient documentation

## 2019-01-02 DIAGNOSIS — J9601 Acute respiratory failure with hypoxia: Secondary | ICD-10-CM | POA: Diagnosis not present

## 2019-01-02 DIAGNOSIS — R64 Cachexia: Secondary | ICD-10-CM | POA: Insufficient documentation

## 2019-01-02 LAB — CBC
HCT: 28.6 % — ABNORMAL LOW (ref 36.0–46.0)
Hemoglobin: 9.2 g/dL — ABNORMAL LOW (ref 12.0–15.0)
MCH: 34.2 pg — ABNORMAL HIGH (ref 26.0–34.0)
MCHC: 32.2 g/dL (ref 30.0–36.0)
MCV: 106.3 fL — ABNORMAL HIGH (ref 80.0–100.0)
Platelets: 215 10*3/uL (ref 150–400)
RBC: 2.69 MIL/uL — ABNORMAL LOW (ref 3.87–5.11)
RDW: 14 % (ref 11.5–15.5)
WBC: 6.4 10*3/uL (ref 4.0–10.5)
nRBC: 0 % (ref 0.0–0.2)

## 2019-01-02 LAB — BASIC METABOLIC PANEL
Anion gap: 10 (ref 5–15)
BUN: 17 mg/dL (ref 8–23)
CO2: 23 mmol/L (ref 22–32)
Calcium: 8.5 mg/dL — ABNORMAL LOW (ref 8.9–10.3)
Chloride: 104 mmol/L (ref 98–111)
Creatinine, Ser: 1.22 mg/dL — ABNORMAL HIGH (ref 0.44–1.00)
GFR calc Af Amer: 47 mL/min — ABNORMAL LOW (ref >60)
GFR calc non Af Amer: 40 mL/min — ABNORMAL LOW (ref >60)
Glucose, Bld: 96 mg/dL (ref 70–99)
Potassium: 4.1 mmol/L (ref 3.5–5.1)
Sodium: 137 mmol/L (ref 135–145)

## 2019-01-02 LAB — TSH: TSH: 5.031 u[IU]/mL — ABNORMAL HIGH (ref 0.350–4.500)

## 2019-01-02 LAB — HEMOGLOBIN A1C
Hgb A1c MFr Bld: 5.8 % — ABNORMAL HIGH (ref 4.8–5.6)
Mean Plasma Glucose: 119.76 mg/dL

## 2019-01-02 LAB — ECHOCARDIOGRAM COMPLETE
Height: 60 in
Weight: 2045.87 oz

## 2019-01-02 MED ORDER — FESOTERODINE FUMARATE ER 4 MG PO TB24
4.0000 mg | ORAL_TABLET | Freq: Every day | ORAL | Status: DC
  Administered 2019-01-02 – 2019-01-03 (×2): 4 mg via ORAL
  Filled 2019-01-02 (×2): qty 1

## 2019-01-02 MED ORDER — RIVAROXABAN 15 MG PO TABS: 15.0000 mg | ORAL_TABLET | Freq: Every day | ORAL | Status: DC

## 2019-01-02 MED ORDER — VERAPAMIL HCL ER 240 MG PO TBCR
240.0000 mg | EXTENDED_RELEASE_TABLET | Freq: Every day | ORAL | Status: DC
  Administered 2019-01-02: 240 mg via ORAL
  Filled 2019-01-02 (×2): qty 1

## 2019-01-02 MED ORDER — RIVAROXABAN 15 MG PO TABS
15.0000 mg | ORAL_TABLET | Freq: Every day | ORAL | Status: AC
  Administered 2019-01-02: 15 mg via ORAL
  Filled 2019-01-02: qty 1

## 2019-01-02 MED ORDER — TOLTERODINE TARTRATE ER 2 MG PO CP24: ORAL_CAPSULE | ORAL | 1 refills | Status: DC

## 2019-01-02 MED ORDER — PANTOPRAZOLE SODIUM 40 MG PO TBEC
80.0000 mg | DELAYED_RELEASE_TABLET | Freq: Every day | ORAL | Status: DC
  Administered 2019-01-02 – 2019-01-03 (×2): 80 mg via ORAL
  Filled 2019-01-02 (×2): qty 2

## 2019-01-02 MED ORDER — ACETAMINOPHEN 650 MG RE SUPP: 650.0000 mg | Freq: Four times a day (QID) | RECTAL | Status: DC | PRN

## 2019-01-02 MED ORDER — ATORVASTATIN CALCIUM 40 MG PO TABS
40.0000 mg | ORAL_TABLET | Freq: Every day | ORAL | Status: DC
  Administered 2019-01-02 – 2019-01-03 (×2): 40 mg via ORAL
  Filled 2019-01-02 (×2): qty 1

## 2019-01-02 MED ORDER — POLYETHYLENE GLYCOL 3350 17 G PO PACK: 17.0000 g | PACK | Freq: Every day | ORAL | Status: DC | PRN

## 2019-01-02 MED ORDER — ACETAMINOPHEN 325 MG PO TABS: 650.0000 mg | ORAL_TABLET | Freq: Four times a day (QID) | ORAL | Status: DC | PRN

## 2019-01-02 MED ORDER — METOPROLOL TARTRATE 100 MG PO TABS
100.0000 mg | ORAL_TABLET | Freq: Two times a day (BID) | ORAL | Status: DC
  Administered 2019-01-02 – 2019-01-03 (×3): 100 mg via ORAL
  Filled 2019-01-02: qty 1
  Filled 2019-01-02: qty 4
  Filled 2019-01-02: qty 1

## 2019-01-02 MED ORDER — POLYVINYL ALCOHOL 1.4 % OP SOLN
1.0000 [drp] | Freq: Every day | OPHTHALMIC | Status: DC
  Administered 2019-01-02 – 2019-01-03 (×2): 1 [drp] via OPHTHALMIC
  Filled 2019-01-02: qty 15

## 2019-01-02 MED ORDER — METOPROLOL TARTRATE 100 MG PO TABS: 100.0000 mg | ORAL_TABLET | Freq: Two times a day (BID) | ORAL | 3 refills | Status: DC

## 2019-01-02 NOTE — Progress Notes (Signed)
Brenda [name] is a 85 y.o. female patient admitted from ED awake, alert - oriented  X 4 - no acute distress noted.  VSS - Blood pressure (!) 151/74, pulse 70, temperature 98.1 F (36.7 C), temperature source Oral, resp. rate 18, height 5' (1.524 m), weight 58 kg, SpO2 100 %.    IV in place, occlusive dsg intact without redness.  Orientation to room, and floor completed with information packet given to patient/family.  Patient declined safety video at this time.  Admission INP armband ID verified with patient/family, and in place.   SR up x 2, fall assessment complete, with patient and family able to verbalize understanding of risk associated with falls, and verbalized understanding to call nsg before up out of bed.  Call light within reach, patient able to voice, and demonstrate understanding.  Skin, clean-dry- intact with generalized bruising,      Will cont to eval and treat per MD orders.  [name], RN 01/02/2019 3:47 PM

## 2019-01-02 NOTE — Progress Notes (Signed)
  Echocardiogram 2D Echocardiogram has been performed.  Brenda [name] 01/02/2019, 4:29 PM

## 2019-01-02 NOTE — Progress Notes (Signed)
PT Cancellation Note  Patient Details Name: Brenda [name] MRN: [id number] DOB: 1932-03-15   Cancelled Treatment:    Reason Eval/Treat Not Completed: Other (comment) Pt currently eating dinner and requesting to hold on PT. Will follow up as schedule allows.   Leighton Ruff, PT, DPT  Acute Rehabilitation Services  Pager: [phone] Office: [phone]    Rudean Hitt 01/02/2019, 5:00 PM

## 2019-01-02 NOTE — Discharge Summary (Signed)
[facility] Discharge Summary  Patient name: Brenda [name] Medical record number: [id number] Date of birth: [birth year] Age: 85 y.o. Gender: female Date of Admission: 01/01/2019  Date of Discharge:01/03/2019 Admitting Physician: Zenia Resides, MD  Primary Care Provider: Benay Pike, MD Consultants: none  Indication for Hospitalization: acute hypoxic respiratory failure   Discharge Diagnoses/Problem List:  Active Problems:   Hypoxia   Chronic atrial fibrillation ([place]) Sinus tachycardia/palpitation Hypertension  Disposition: Home  Discharge Condition: Stable  Discharge Exam:  General: Alert in no acute distress Cardiovascular: Regular rate and rhythm, no murmurs appreciated, no lower extremity edema  Respiratory: Clear to auscultation bilaterally, no crackles, no rhonchi, good cap refill  Abdomen:, Nontender, nondistended, bowel sounds present  Extremities: Moving all extremities  Brief Hospital Course:  Brenda [name] is a 85 year old female with history of A. fib, taking Xarelto, seizures, H TN, HLD, GERD, anemia and arthritis who presents from a clinic visit with hypoxia and palpitation.  Patient had been without metoprolol and verapamil for 3 days.  On initial presentation oxygen saturations 83% on room air and she was started on 3 L of oxygen by nasal cannula.  Patient is improved to 100%.  Chest x-ray negative for acute cardiopulmonary disease.  CTA chest negative for PE.  Echo was performed, left ventricular ejection fraction 60 to 65% with normal left ventricular function.  In the ED she was  tachycardic with heart rate ranging between 135-145, BP range 110- 139/60- 112.  Respiration rate 16-25 and afebrile.  Initial EKG showed SVT with prolonged QTC.  Repeat EKG sinus tachycardia without any ST elevation or SVT.  She was given metoprolol 100 mg with successful rate control to the 70s.  She was restarted on metoprolol 100 mg twice a day  as well as verapamil CR 240 mg nightly.  She no longer required O2 support.  On the day of discharge she was not tachypneic, oxygen saturations 94 to 100% on room air.   Issues for Follow Up:  1. Monitor heart rate.  Continue metoprolol 100 mg twice daily.  Continue verapamil CR 240 mg nightly.  Adjust medication as needed. 2. Patient also taken for Xarelto.  Has history of falls.  Discussed risks and benefits of continuing anticoagulation.  Significant Procedures: None  Significant Labs and Imaging:  Recent Labs  Lab 01/01/19 1710 01/02/19 0405  WBC 10.6* 6.4  HGB 11.1* 9.2*  HCT 34.0* 28.6*  PLT 277 215   Recent Labs  Lab 01/01/19 1710 01/02/19 0405  NA 134* 137  K 4.4 4.1  CL 100 104  CO2 20* 23  GLUCOSE 128* 96  BUN 21 17  CREATININE 1.43* 1.22*  CALCIUM 9.1 8.5*  ALKPHOS 119  --   AST 26  --   ALT 16  --   ALBUMIN 3.3*  --       Results/Tests Pending at Time of Discharge:   Discharge Medications:  Allergies as of 01/03/2019      Reactions   Sotalol Other (See Comments)   Seizures   Erythromycin Nausea Only   Augmentin [amoxicillin-pot Clavulanate] Rash   Crestor [rosuvastatin Calcium] Rash   Keflex [cephalexin] Rash   Septra [sulfamethoxazole-trimethoprim] Rash      Medication List    STOP taking these medications   bisacodyl 5 MG EC tablet Commonly known as: Dulcolax   ondansetron 4 MG disintegrating tablet Commonly known as: Zofran ODT     TAKE these medications   acetaminophen 500 MG tablet  Commonly known as: TYLENOL Take 500-1,000 mg by mouth every 6 (six) hours as needed for mild pain.   atorvastatin 40 MG tablet Commonly known as: LIPITOR TAKE 1 TABLET(40 MG) BY MOUTH DAILY What changed: See the new instructions.   ferrous sulfate 325 (65 FE) MG tablet Take 325 mg by mouth 3 (three) times a week. Monday, Wed, Fri.   furosemide 20 MG tablet Commonly known as: LASIX Take 1 tablet (20 mg total) by mouth daily as needed.    metoprolol tartrate 100 MG tablet Commonly known as: LOPRESSOR Take 1 tablet (100 mg total) by mouth 2 (two) times daily.   metoprolol tartrate 100 MG tablet Commonly known as: LOPRESSOR Take 1 tablet (100 mg total) by mouth 2 (two) times daily.   omeprazole 40 MG capsule Commonly known as: PRILOSEC Take 1 capsule (40 mg total) by mouth 2 (two) times daily. Take before breakfast and dinner.   sucralfate 1 GM/10ML suspension Commonly known as: Carafate TAKE 10 ML (1 GRAM TOTAL) EVERY 6 HOURS AS NEEDED What changed:   how much to take  how to take this  when to take this  reasons to take this  additional instructions   Systane Complete 0.6 % Soln Generic drug: Propylene Glycol Place 1 drop into both eyes daily.   tolterodine 2 MG 24 hr capsule Commonly known as: DETROL LA TAKE 1 CAPSULE(2 MG) BY MOUTH DAILY What changed:   how much to take  how to take this  when to take this  additional instructions   verapamil 240 MG CR tablet Commonly known as: CALAN-SR Take 1 tablet (240 mg total) by mouth at bedtime.   VITAMIN D PO Take 1 capsule by mouth daily.   Xarelto 15 MG Tabs tablet Generic drug: Rivaroxaban TAKE 1 TABLET DAILY WITH SUPPER What changed: See the new instructions.       Discharge Instructions: Please refer to Patient Instructions section of EMR for full details.  Patient was counseled important signs and symptoms that should prompt return to medical care, changes in medications, dietary instructions, activity restrictions, and follow up appointments.   Follow-Up Appointments: Follow-up Information    Guadalupe Dawn, MD Follow up on 01/09/2019.   Specialty: Family Medicine Why: appointment at 830am please arrive 15mis earlier Contact information: [address] [place] [address] [phone]           Carollee Leitz, MD 01/07/2019, 4:45 PM PGY-1, [place]

## 2019-01-02 NOTE — Progress Notes (Signed)
Family Medicine Teaching Service Daily Progress Note Intern Pager: [phone]  Patient name: Brenda [name] Medical record number: [id number] Date of birth: [birth year] Age: 85 y.o. Gender: female  Primary Care Provider: Benay Pike, MD Consultants: None Code Status: DNR  Pt Overview and Major Events to Date:  10/06  Assessment and Plan: Brenda [name] is a 85 y.o. female presenting from clinic with SOB and palpitations consistent with acute hypoxic respiratory failure. PMH is significant for afib (Xarelto), seizures, HTN, HLD, GERD, anemia and arthritis.   Acute Hypoxic Respiratory Failure-improving -Pt able to talk in complete sentences.  O2 on room air 97-100%.  No tachypnea, RR 16, no IWOB -maintain O2 sats >92% -cardiac monitoring -pulse oximetry monitoring -PT/OT  -Tylenol prn  -incentive spirometry    Tachycardia with h/o Atrial Fibrillation-improved  -pt has ICD.  Current HR 77. TSH 5.031 -cardiac monitoring -continue Metoprolol 100 mg BID -continue Verapamil CR 240mg  qhs -Continue Xarelto 15 mg daily -monitor for signs and symptoms of bleeding   CKD IIIb-improving -Creatinine 1.22, decreased from 1.43 -avoid nephrotoxic drugs  Recent h/o falls -fall precautions -PT/OT eval   HTN Patient on home Metoprolol 100mg  BID and verapamil 240 mg nightly.  -last BP 125/72 -continue home metoprolol and verapamil  -continue to monitor with vitals   HLD Patient on home Atorvastatin 40mg  daily  -continue home meds  GERD Patient on home omeprazole 40mg  BID.  -Will start protonix while inpatient, 80mg .   Anemia Patient with reported history of anemia per chart review. Last hemoglobin of 11.1. Previous hemoglobin ranges from 9.6-10.5. Will continue to monitor for any decrease in hemoglobin.  -CBC in AM    Overactive Bladder Patient on home Detrol 2mg  daily -Will substitute with Toviaz 4mg  while inpatient.  FEN/GI: heart healthy diet   Prophylaxis: Xarelto   Disposition:Home when medically stable  Subjective:  Pt eating breakfast. Currently on room air and oxygen sats 100%.  Denies any chest pain, SOB, n/v, abdo pain.  Pt HOH. Daughter at bedside.  She states she is unsure if she is feeling any better.  Objective: Temp:  [97.6 F (36.4 C)] 97.6 F (36.4 C) (10/06 1701) Pulse Rate:  [70-145] 70 (10/07 0400) Resp:  [10-25] 20 (10/07 0400) BP: (110-139)/(51-112) 124/65 (10/07 0400) SpO2:  [83 %-100 %] 100 % (10/07 0400) Weight:  [55.8 kg] 55.8 kg (10/06 1709) Physical Exam: General: Alert and in no acute distress Cardiovascular: RRR, no murmurs appreciated, no lower extremity edema  Respiratory: CTAB, no crackles, no rhonchi, cap refill good Abdomen: soft, non distended, non tender, BS present Extremities: moving all extremities  Laboratory: Recent Labs  Lab 01/01/19 1710  WBC 10.6*  HGB 11.1*  HCT 34.0*  PLT 277   Recent Labs  Lab 01/01/19 1710  NA 134*  K 4.4  CL 100  CO2 20*  BUN 21  CREATININE 1.43*  CALCIUM 9.1  PROT 7.3  BILITOT 0.9  ALKPHOS 119  ALT 16  AST 26  GLUCOSE 128*      Imaging/Diagnostic Tests: Dg Chest 2 View  Result Date: 01/02/2019 CLINICAL DATA:  Shortness of breath EXAM: CHEST - 2 VIEW COMPARISON:  Chest CTA from yesterday FINDINGS: Normal heart size. Lower mediastinal widening from hiatal hernia. Aortic contours are distorted by rotation on the frontal view. Dual-chamber pacer leads from the left in unremarkable position. There is no edema, consolidation, effusion, or pneumothorax. IMPRESSION: 1. No evidence of acute cardiopulmonary disease. 2. Low lung volumes. Electronically Signed   By: Angelica Chessman  Watts M.D.   On: 01/02/2019 04:57   Ct Angio Chest Pe W And/or Wo Contrast  Result Date: 01/01/2019 CLINICAL DATA:  85 year old with three-day history of shortness of breath, found to be tachycardic and hypoxemic at her primary provider's office earlier today. EXAM: CT  ANGIOGRAPHY CHEST WITH CONTRAST TECHNIQUE: Multidetector CT imaging of the chest was performed using the standard protocol during bolus administration of intravenous contrast. Multiplanar CT image reconstructions and MIPs were obtained to evaluate the vascular anatomy. CONTRAST:  36mL OMNIPAQUE IOHEXOL 350 MG/ML IV. COMPARISON:  None. FINDINGS: Cardiovascular: Contrast opacification of the pulmonary arteries is very good. No filling defects within either main pulmonary artery or their segmental branches in either lung to suggest pulmonary embolism. Heart mildly to moderately enlarged with LEFT atrial enlargement in particular. Pacemaker lead tips in the RIGHT atrial appendage and at the RV apex. Moderate to severe three-vessel coronary atherosclerosis. Mild aortic valvular calcification. Dense mitral annular calcification. No pericardial effusion. Moderate atherosclerosis involving the thoracic and proximal abdominal aorta without evidence of aneurysm. Mediastinum/Nodes: Large hiatal hernia. Normal appearing gas-filled esophagus. No pathologic lymphadenopathy. Normal-appearing thyroid gland. Lungs/Pleura: Dependent atelectasis posteriorly in both lungs. No confluent or ground-glass airspace consolidation. No parenchymal nodules or masses. No pleural effusions. Central airways patent with calcified tracheobronchial cartilages and mild bronchial wall thickening. Upper Abdomen: Surgically absent gallbladder. Atrophic pancreas. Diverticulosis involving the visualized descending colon. No acute findings. Musculoskeletal: Exaggeration of the usual thoracic kyphosis. Osseous demineralization. Prior T12 vertebral body augmentation. Degenerative disc disease and spondylosis involving the lower cervical spine. Degenerative disc disease throughout the thoracic spine. Remote sternal fracture. No acute findings. Review of the MIP images confirms the above findings. IMPRESSION: 1. No evidence of pulmonary embolism. 2. Dependent  atelectasis posteriorly in both lungs. No acute cardiopulmonary disease otherwise. 3. Cardiomegaly with LEFT atrial enlargement. Three-vessel coronary atherosclerosis. Mild aortic valvular calcification. 4. Large hiatal hernia. Aortic Atherosclerosis (ICD10-I70.0). Electronically Signed   By: Evangeline Dakin M.D.   On: 01/01/2019 21:02    Carollee Leitz, MD 01/02/2019, 5:28 AM PGY-1, [place] Intern pager: [phone], text pages welcome

## 2019-01-02 NOTE — ED Notes (Signed)
Tele   Breakfast ordered  

## 2019-01-02 NOTE — ED Notes (Signed)
ED TO INPATIENT HANDOFF REPORT  ED Nurse Name and Phone #: Nicki Reaper [place] Name/Age/Gender Juan Quam 85 y.o. female Room/Bed: 057C/057C  Code Status   Code Status: DNR  Home/SNF/Other Home Patient oriented to: self, place and situation Is this baseline? Yes   Triage Complete: Triage complete  Chief Complaint lox O2 and high HR  Triage Note Pt went to MD office today for feeling SOB/ gittery all weekend. At the office, they noticed her HR 140's, O2 83% on room air. Pt sent here from MD office.    Allergies Allergies  Allergen Reactions  . Sotalol Other (See Comments)    Seizures   . Erythromycin Nausea Only  . Augmentin [Amoxicillin-Pot Clavulanate] Rash  . Crestor [Rosuvastatin Calcium] Rash  . Keflex [Cephalexin] Rash  . Septra [Sulfamethoxazole-Trimethoprim] Rash    Level of Care/Admitting Diagnosis ED Disposition    ED Disposition Condition Comment   Admit  Hospital Area: [facility] [[id number]]  Level of Care: Telemetry Medical [104]  Covid Evaluation: Asymptomatic Screening Protocol (No Symptoms)  Diagnosis: Hypoxia [id number]  Admitting Physician: Zenia Resides [[id number]]  Attending Physician: Madison Hickman A [[id number]]  PT Class (Do Not Modify): Observation [104]  PT Acc Code (Do Not Modify): Observation [10022]       B Medical/Surgery History Past Medical History:  Diagnosis Date  . A-fib ([place])   . Anemia   . Arthritis   . Basal cell carcinoma    nose  . Diabetes mellitus without complication ([place])    pt denies diabetes  . GERD (gastroesophageal reflux disease)   . Hiatal hernia   . HLD (hyperlipidemia)   . Hypertension   . Seizure ([place]) 2011   Past Surgical History:  Procedure Laterality Date  . CARDIAC DEFIBRILLATOR PLACEMENT    . CARDIAC DEFIBRILLATOR PLACEMENT    . CATARACT EXTRACTION Left 02/2001  . CHOLECYSTECTOMY    . COLONOSCOPY WITH PROPOFOL N/A 07/19/2017   Procedure: COLONOSCOPY WITH PROPOFOL;   Surgeon: Jerene Bears, MD;  Location: [facility];  Service: Gastroenterology;  Laterality: N/A;  . ESOPHAGOGASTRODUODENOSCOPY (EGD) WITH PROPOFOL N/A 07/19/2017   Procedure: ESOPHAGOGASTRODUODENOSCOPY (EGD) WITH PROPOFOL;  Surgeon: Jerene Bears, MD;  Location: [facility] ENDOSCOPY;  Service: Gastroenterology;  Laterality: N/A;  . LUMBAR LAMINECTOMY  06/11/2001  . LUMBAR LAMINECTOMY  02/09/2006  . LUMBAR LAMINECTOMY  07/07/2006   with spianal cord stimulator  . SPINAL CORD STIMULATOR REMOVAL  14/2009  . TUBAL LIGATION       A IV Location/Drains/Wounds Patient Lines/Drains/Airways Status   Active Line/Drains/Airways    Name:   Placement date:   Placement time:   Site:   Days:   Peripheral IV 11/28/18 Right Forearm   11/28/18    2107    Forearm   35   Peripheral IV 01/01/19 Right Antecubital   01/01/19    1708    Antecubital   1          Intake/Output Last 24 hours No intake or output data in the 24 hours ending 01/02/19 1337  Labs/Imaging Results for orders placed or performed during the hospital encounter of 01/01/19 (from the past 48 hour(s))  Lactic acid, plasma     Status: Abnormal   Collection Time: 01/01/19  5:10 PM  Result Value Ref Range   Lactic Acid, Venous 2.5 (HH) 0.5 - 1.9 mmol/L    Comment: CRITICAL RESULT CALLED TO, READ BACK BY AND VERIFIED WITH: E.ADKINS RN 1752 01/01/2019 MCCORMICK K Performed at  [facility], [place] [address], [place], [place] [address]   Comprehensive metabolic panel     Status: Abnormal   Collection Time: 01/01/19  5:10 PM  Result Value Ref Range   Sodium 134 (L) 135 - 145 mmol/L   Potassium 4.4 3.5 - 5.1 mmol/L   Chloride 100 98 - 111 mmol/L   CO2 20 (L) 22 - 32 mmol/L   Glucose, Bld 128 (H) 70 - 99 mg/dL   BUN 21 8 - 23 mg/dL   Creatinine, Ser 1.43 (H) 0.44 - 1.00 mg/dL   Calcium 9.1 8.9 - 10.3 mg/dL   Total Protein 7.3 6.5 - 8.1 g/dL   Albumin 3.3 (L) 3.5 - 5.0 g/dL   AST 26 15 - 41 U/L   ALT 16 0 - 44 U/L   Alkaline Phosphatase  119 38 - 126 U/L   Total Bilirubin 0.9 0.3 - 1.2 mg/dL   GFR calc non Af Amer 33 (L) >60 mL/min   GFR calc Af Amer 39 (L) >60 mL/min   Anion gap 14 5 - 15    Comment: Performed at [facility] [address], [place], [place] [address]  CBC WITH DIFFERENTIAL     Status: Abnormal   Collection Time: 01/01/19  5:10 PM  Result Value Ref Range   WBC 10.6 (H) 4.0 - 10.5 K/uL   RBC 3.27 (L) 3.87 - 5.11 MIL/uL   Hemoglobin 11.1 (L) 12.0 - 15.0 g/dL   HCT 34.0 (L) 36.0 - 46.0 %   MCV 104.0 (H) 80.0 - 100.0 fL   MCH 33.9 26.0 - 34.0 pg   MCHC 32.6 30.0 - 36.0 g/dL   RDW 13.8 11.5 - 15.5 %   Platelets 277 150 - 400 K/uL   nRBC 0.0 0.0 - 0.2 %   Neutrophils Relative % 72 %   Neutro Abs 7.6 1.7 - 7.7 K/uL   Lymphocytes Relative 19 %   Lymphs Abs 2.0 0.7 - 4.0 K/uL   Monocytes Relative 8 %   Monocytes Absolute 0.8 0.1 - 1.0 K/uL   Eosinophils Relative 0 %   Eosinophils Absolute 0.0 0.0 - 0.5 K/uL   Basophils Relative 0 %   Basophils Absolute 0.0 0.0 - 0.1 K/uL   Immature Granulocytes 1 %   Abs Immature Granulocytes 0.07 0.00 - 0.07 K/uL    Comment: Performed at [facility], [place] [address], [place], [place] [address]  APTT     Status: Abnormal   Collection Time: 01/01/19  5:10 PM  Result Value Ref Range   aPTT 37 (H) 24 - 36 seconds    Comment:        IF BASELINE aPTT IS ELEVATED, SUGGEST PATIENT RISK ASSESSMENT BE USED TO DETERMINE APPROPRIATE ANTICOAGULANT THERAPY. Performed at [facility], [place] [address], [place], [place] [address]   Protime-INR     Status: Abnormal   Collection Time: 01/01/19  5:10 PM  Result Value Ref Range   Prothrombin Time 27.1 (H) 11.4 - 15.2 seconds   INR 2.6 (H) 0.8 - 1.2    Comment: (NOTE) INR goal varies based on device and disease states. Performed at [facility], [place] [address] [place], [place] [address]   Troponin I (High Sensitivity)     Status: None   Collection Time: 01/01/19  5:10 PM  Result Value Ref Range    Troponin I (High Sensitivity) 15 <18 ng/L    Comment: (NOTE) Elevated high  sensitivity troponin I (hsTnI) values and significant  changes across serial measurements may suggest ACS but many other  chronic and acute conditions are known to elevate hsTnI results.  Refer to the "Links" section for chest pain algorithms and additional  guidance. Performed at [facility], [place] [address] [place], [place] [address]   Brain natriuretic peptide     Status: Abnormal   Collection Time: 01/01/19  5:12 PM  Result Value Ref Range   B Natriuretic Peptide 229.4 (H) 0.0 - 100.0 pg/mL    Comment: Performed at [facility] [address], [place], [place] [address]  Blood Culture (routine x 2)     Status: None (Preliminary result)   Collection Time: 01/01/19  5:26 PM   Specimen: BLOOD  Result Value Ref Range   Specimen Description BLOOD RIGHT ANTECUBITAL    Special Requests      BOTTLES DRAWN AEROBIC AND ANAEROBIC Blood Culture adequate volume   Culture      NO GROWTH < 24 HOURS Performed at [facility], [place] [address], [place], [place] [address]    Report Status PENDING   Blood Culture (routine x 2)     Status: None (Preliminary result)   Collection Time: 01/01/19  5:35 PM   Specimen: BLOOD  Result Value Ref Range   Specimen Description BLOOD LEFT ANTECUBITAL    Special Requests      BOTTLES DRAWN AEROBIC AND ANAEROBIC Blood Culture adequate volume   Culture      NO GROWTH < 24 HOURS Performed at [facility], [place] [address], [place], [place] [address]    Report Status PENDING   SARS Coronavirus 2 [facility] order, Performed in [facility] hospital lab) Nasopharyngeal Nasopharyngeal Swab     Status: None   Collection Time: 01/01/19  5:56 PM   Specimen: Nasopharyngeal Swab  Result Value Ref Range   SARS Coronavirus 2 NEGATIVE NEGATIVE    Comment: (NOTE) If result is NEGATIVE SARS-CoV-2 target nucleic acids are NOT DETECTED. The SARS-CoV-2 RNA is generally  detectable in upper and lower  respiratory specimens during the acute phase of infection. The lowest  concentration of SARS-CoV-2 viral copies this assay can detect is 250  copies / mL. A negative result does not preclude SARS-CoV-2 infection  and should not be used as the sole basis for treatment or other  patient management decisions.  A negative result may occur with  improper specimen collection / handling, submission of specimen other  than nasopharyngeal swab, presence of viral mutation(s) within the  areas targeted by this assay, and inadequate number of viral copies  (<250 copies / mL). A negative result must be combined with clinical  observations, patient history, and epidemiological information. If result is POSITIVE SARS-CoV-2 target nucleic acids are DETECTED. The SARS-CoV-2 RNA is generally detectable in upper and lower  respiratory specimens dur ing the acute phase of infection.  Positive  results are indicative of active infection with SARS-CoV-2.  Clinical  correlation with patient history and other diagnostic information is  necessary to determine patient infection status.  Positive results do  not rule out bacterial infection or co-infection with other viruses. If result is PRESUMPTIVE POSTIVE SARS-CoV-2 nucleic acids MAY BE PRESENT.   A presumptive positive result was obtained on the submitted specimen  and confirmed on repeat testing.  While 2019 novel coronavirus  (SARS-CoV-2) nucleic acids may be present in the submitted sample  additional confirmatory testing may be necessary  for epidemiological  and / or clinical management purposes  to differentiate between  SARS-CoV-2 and other Sarbecovirus currently known to infect humans.  If clinically indicated additional testing with an alternate test  methodology [phone]) is advised. The SARS-CoV-2 RNA is generally  detectable in upper and lower respiratory sp ecimens during the acute  phase of infection. The  expected result is Negative. Fact Sheet for Patients:  StrictlyIdeas.no Fact Sheet for Healthcare Providers: BankingDealers.co.za This test is not yet approved or cleared by the [place] FDA and has been authorized for detection and/or diagnosis of SARS-CoV-2 by FDA under an Emergency Use Authorization (EUA).  This EUA will remain in effect (meaning this test can be used) for the duration of the COVID-19 declaration under Section 564(b)(1) of the Act, 21 U.S.C. section 360bbb-3(b)(1), unless the authorization is terminated or revoked sooner. Performed at [facility], [place] [address] [place], [place] [address]   Lactic acid, plasma     Status: None   Collection Time: 01/01/19  7:10 PM  Result Value Ref Range   Lactic Acid, Venous 1.4 0.5 - 1.9 mmol/L    Comment: Performed at [facility] [address] [place], [place] [address]  Troponin I (High Sensitivity)     Status: None   Collection Time: 01/01/19  7:30 PM  Result Value Ref Range   Troponin I (High Sensitivity) 15 <18 ng/L    Comment: (NOTE) Elevated high sensitivity troponin I (hsTnI) values and significant  changes across serial measurements may suggest ACS but many other  chronic and acute conditions are known to elevate hsTnI results.  Refer to the "Links" section for chest pain algorithms and additional  guidance. Performed at [facility], [place] [address], [place], [place] [address]   Urinalysis, Routine w reflex microscopic     Status: Abnormal   Collection Time: 01/01/19 10:45 PM  Result Value Ref Range   Color, Urine YELLOW YELLOW   APPearance HAZY (A) CLEAR   Specific Gravity, Urine 1.010 1.005 - 1.030   pH 5.0 5.0 - 8.0   Glucose, UA NEGATIVE NEGATIVE mg/dL   Hgb urine dipstick NEGATIVE NEGATIVE   Bilirubin Urine NEGATIVE NEGATIVE   Ketones, ur NEGATIVE NEGATIVE mg/dL   Protein, ur NEGATIVE NEGATIVE mg/dL   Nitrite NEGATIVE NEGATIVE    Leukocytes,Ua SMALL (A) NEGATIVE   RBC / HPF 0-5 0 - 5 RBC/hpf   WBC, UA 0-5 0 - 5 WBC/hpf   Bacteria, UA RARE (A) NONE SEEN   Squamous Epithelial / LPF 0-5 0 - 5    Comment: Performed at [facility] [address], [place], [place] [id number]  Basic metabolic panel     Status: Abnormal   Collection Time: 01/02/19  4:05 AM  Result Value Ref Range   Sodium 137 135 - 145 mmol/L   Potassium 4.1 3.5 - 5.1 mmol/L   Chloride 104 98 - 111 mmol/L   CO2 23 22 - 32 mmol/L   Glucose, Bld 96 70 - 99 mg/dL   BUN 17 8 - 23 mg/dL   Creatinine, Ser 1.22 (H) 0.44 - 1.00 mg/dL   Calcium 8.5 (L) 8.9 - 10.3 mg/dL   GFR calc non Af Amer 40 (L) >60 mL/min   GFR calc Af Amer 47 (L) >60 mL/min   Anion gap 10 5 - 15    Comment: Performed at [facility] [address] [place], [place] [address]  CBC  Status: Abnormal   Collection Time: 01/02/19  4:05 AM  Result Value Ref Range   WBC 6.4 4.0 - 10.5 K/uL   RBC 2.69 (L) 3.87 - 5.11 MIL/uL   Hemoglobin 9.2 (L) 12.0 - 15.0 g/dL   HCT 28.6 (L) 36.0 - 46.0 %   MCV 106.3 (H) 80.0 - 100.0 fL   MCH 34.2 (H) 26.0 - 34.0 pg   MCHC 32.2 30.0 - 36.0 g/dL   RDW 14.0 11.5 - 15.5 %   Platelets 215 150 - 400 K/uL   nRBC 0.0 0.0 - 0.2 %    Comment: Performed at [facility] [address] [place], [place] [address]  Hemoglobin A1c     Status: Abnormal   Collection Time: 01/02/19  4:05 AM  Result Value Ref Range   Hgb A1c MFr Bld 5.8 (H) 4.8 - 5.6 %    Comment: (NOTE) Pre diabetes:          5.7%-6.4% Diabetes:              >6.4% Glycemic control for   <7.0% adults with diabetes    Mean Plasma Glucose 119.76 mg/dL    Comment: Performed at [facility] [address] [place], [place] [address]  TSH     Status: Abnormal   Collection Time: 01/02/19  4:05 AM  Result Value Ref Range   TSH 5.031 (H) 0.350 - 4.500 uIU/mL    Comment: Performed by a 3rd Generation assay with a functional sensitivity of <=0.01 uIU/mL. Performed at [facility], [place] [address], [place], [place] [address]    Dg Chest 2 View  Result Date: 01/02/2019 CLINICAL DATA:  Shortness of breath EXAM: CHEST - 2 VIEW COMPARISON:  Chest CTA from yesterday FINDINGS: Normal heart size. Lower mediastinal widening from hiatal hernia. Aortic contours are distorted by rotation on the frontal view. Dual-chamber pacer leads from the left in unremarkable position. There is no edema, consolidation, effusion, or pneumothorax. IMPRESSION: 1. No evidence of acute cardiopulmonary disease. 2. Low lung volumes. Electronically Signed   By: Monte Fantasia M.D.   On: 01/02/2019 04:57   Ct Angio Chest Pe W And/or Wo Contrast  Result Date: 01/01/2019 CLINICAL DATA:  85 year old with three-day history of shortness of breath, found to be tachycardic and hypoxemic at her primary provider's office earlier today. EXAM: CT ANGIOGRAPHY CHEST WITH CONTRAST TECHNIQUE: Multidetector CT imaging of the chest was performed using the standard protocol during bolus administration of intravenous contrast. Multiplanar CT image reconstructions and MIPs were obtained to evaluate the vascular anatomy. CONTRAST:  47mL OMNIPAQUE IOHEXOL 350 MG/ML IV. COMPARISON:  None. FINDINGS: Cardiovascular: Contrast opacification of the pulmonary arteries is very good. No filling defects within either main pulmonary artery or their segmental branches in either lung to suggest pulmonary embolism. Heart mildly to moderately enlarged with LEFT atrial enlargement in particular. Pacemaker lead tips in the RIGHT atrial appendage and at the RV apex. Moderate to severe three-vessel coronary atherosclerosis. Mild aortic valvular calcification. Dense mitral annular calcification. No pericardial effusion. Moderate atherosclerosis involving the thoracic and proximal abdominal aorta without evidence of aneurysm. Mediastinum/Nodes: Large hiatal hernia. Normal appearing gas-filled esophagus. No pathologic lymphadenopathy.  Normal-appearing thyroid gland. Lungs/Pleura: Dependent atelectasis posteriorly in both lungs. No confluent or ground-glass airspace consolidation. No parenchymal nodules or masses. No pleural effusions. Central airways patent with calcified tracheobronchial cartilages and mild bronchial wall thickening. Upper Abdomen: Surgically absent gallbladder. Atrophic pancreas. Diverticulosis involving the visualized descending  colon. No acute findings. Musculoskeletal: Exaggeration of the usual thoracic kyphosis. Osseous demineralization. Prior T12 vertebral body augmentation. Degenerative disc disease and spondylosis involving the lower cervical spine. Degenerative disc disease throughout the thoracic spine. Remote sternal fracture. No acute findings. Review of the MIP images confirms the above findings. IMPRESSION: 1. No evidence of pulmonary embolism. 2. Dependent atelectasis posteriorly in both lungs. No acute cardiopulmonary disease otherwise. 3. Cardiomegaly with LEFT atrial enlargement. Three-vessel coronary atherosclerosis. Mild aortic valvular calcification. 4. Large hiatal hernia. Aortic Atherosclerosis (ICD10-I70.0). Electronically Signed   By: Evangeline Dakin M.D.   On: 01/01/2019 21:02   Dg Chest Port 1 View  Result Date: 01/01/2019 CLINICAL DATA:  Shortness of breath, hypoxia EXAM: PORTABLE CHEST 1 VIEW COMPARISON:  09/28/2018 FINDINGS: Lungs are clear. No pleural effusion or pneumothorax. The heart is normal in size. Left subclavian ICD. Moderate hiatal hernia. Old right posterior 5th rib fracture deformity. Lumbar spine fixation hardware, incompletely visualized. IMPRESSION: No evidence of acute cardiopulmonary disease. Electronically Signed   By: Julian Hy M.D.   On: 01/01/2019 17:35    Pending Labs Unresulted Labs (From admission, onward)    Start     Ordered   01/01/19 1710  Urine culture  ONCE - STAT,   STAT     01/01/19 1712          Vitals/Pain Today's Vitals   01/02/19 0200  01/02/19 0400 01/02/19 0600 01/02/19 1317  BP: (!) 134/51 124/65 125/72 (!) 141/61  Pulse: 70 70 71 70  Resp: 10 20 20 18   Temp:    98.1 F (36.7 C)  TempSrc:    Oral  SpO2: 100% 100% 100% 100%  Weight:      Height:      PainSc:        Isolation Precautions No active isolations  Medications Medications  atorvastatin (LIPITOR) tablet 40 mg (40 mg Oral Given 01/02/19 1058)  metoprolol tartrate (LOPRESSOR) tablet 100 mg (100 mg Oral Given 01/02/19 1058)  verapamil (CALAN-SR) CR tablet 240 mg (has no administration in time range)  pantoprazole (PROTONIX) EC tablet 80 mg (80 mg Oral Given 01/02/19 1058)  fesoterodine (TOVIAZ) tablet 4 mg (4 mg Oral Given 01/02/19 1059)  polyvinyl alcohol (LIQUIFILM TEARS) 1.4 % ophthalmic solution 1 drop (1 drop Both Eyes Given 01/02/19 1058)  acetaminophen (TYLENOL) tablet 650 mg (has no administration in time range)    Or  acetaminophen (TYLENOL) suppository 650 mg (has no administration in time range)  polyethylene glycol (MIRALAX / GLYCOLAX) packet 17 g (has no administration in time range)  Rivaroxaban (XARELTO) tablet 15 mg (has no administration in time range)  metoprolol tartrate (LOPRESSOR) tablet 100 mg (100 mg Oral Given 01/01/19 1754)  sodium chloride 0.9 % bolus 1,000 mL (0 mLs Intravenous Stopped 01/02/19 1247)  iohexol (OMNIPAQUE) 350 MG/ML injection 60 mL (60 mLs Intravenous Contrast Given 01/01/19 2032)  Rivaroxaban (XARELTO) tablet 15 mg (15 mg Oral Given 01/02/19 1059)    Mobility walks with device High fall risk   Focused Assessments Pulmonary Assessment Handoff:  Lung sounds:   O2 Device: Room Air O2 Flow Rate (L/min): 3 L/min      R Recommendations: See Admitting Provider Note  Report given to:   Additional Notes:

## 2019-01-03 DIAGNOSIS — R0602 Shortness of breath: Secondary | ICD-10-CM | POA: Diagnosis not present

## 2019-01-03 DIAGNOSIS — I482 Chronic atrial fibrillation, unspecified: Secondary | ICD-10-CM | POA: Diagnosis not present

## 2019-01-03 DIAGNOSIS — R0902 Hypoxemia: Secondary | ICD-10-CM | POA: Diagnosis not present

## 2019-01-03 DIAGNOSIS — J9601 Acute respiratory failure with hypoxia: Secondary | ICD-10-CM | POA: Diagnosis not present

## 2019-01-03 LAB — URINE CULTURE

## 2019-01-03 MED ORDER — METOPROLOL TARTRATE 100 MG PO TABS: 100.0000 mg | ORAL_TABLET | Freq: Two times a day (BID) | ORAL | 0 refills | Status: DC

## 2019-01-03 MED ORDER — SUCRALFATE 1 GM/10ML PO SUSP: ORAL | 0 refills | Status: DC

## 2019-01-03 NOTE — Discharge Instructions (Signed)
Follow up appointment with PCP 10/14 at 830am  If you have any worsening shortness of breath or increase in heart rate please seek medical attention   Continue taking Metoprolol 100mg  twice a day, 1 tablet in the morning and 1 tablet in the evening. Continue taking Verapamil CR 240mg  once a day at bedtime

## 2019-01-03 NOTE — Progress Notes (Signed)
Brenda [name] to be D/C'd Home per MD order.  Discussed with the patient and all questions fully answered.  VSS, Skin clean, dry and intact without evidence of skin break down, no evidence of skin tears noted. IV catheter discontinued intact. Site without signs and symptoms of complications. Dressing and pressure applied.  An After Visit Summary was printed and given to the patient. Patient received prescription.  D/c education completed with patient/family including follow up instructions, medication list, d/c activities limitations if indicated, with other d/c instructions as indicated by MD - patient able to verbalize understanding, all questions fully answered.   Patient instructed to return to ED, call 911, or call MD for any changes in condition.   Patient escorted via [place], and D/C home via private auto.  [name] 01/03/2019 6:13 PM

## 2019-01-03 NOTE — TOC Transition Note (Signed)
Transition of Care [facility]) - CM/SW Discharge Note   Patient Details  Name: Brenda [name] MRN: [id number] Date of Birth: [birth year]  Transition of [facility]) CM/SW Contact:  Sharin Mons, RN Phone Number: 01/03/2019, 4:06 PM   Clinical Narrative:     Presented with SOB and palpitations / acute hypoxic respiratory failure. PMH is significant for afib (Xarelto), seizures, HTN, HLD, GERD, anemia and arthritis. From home with daughter. Daughter states mom has PCS from [facility] , M-F (11a-8p), while she's @ work. DME @ home: BSC,rolator, walker.  [name] (Daughter)     [phone]      PCP: Addison Naegeli  Plan: Pt will transition to home today with daughter. Daughter states pt will have 24/7 supervision assistance. NCM shared PT's evaluation / recommendation to pt and daughter. Both declined SNF placement. Agreeable to home health services. Choice given. [facility] selected. Referral made with [facility] for HHPT, acceptance pending. [place] to f/u with pt/daughter...  Referral for DME W/C made with [employer]. W/C will be delivered to bedside prior to d/c pending approval.   Final next level of care: [place] Barriers to Discharge: No Barriers Identified   Patient Goals and CMS Choice Patient states their goals for this hospitalization and ongoing recovery are:: [place] home CMS Medicare.gov Compare Post Acute Care list provided to:: Patient Choice offered to / list presented to : Patient, Adult Children([name] ( daughter))  Discharge Placement    Discharge Plan and Services                DME Arranged: Wheelchair manual DME Agency: [employer] Date DME Agency Contacted: 01/03/19 Time DME Agency Contacted: [phone] Representative spoke with at DME Agency: [place]: PT [place]: [place] ([place]) Date [place]: 01/03/19 Time [place]: [place] Representative spoke with at [place]: [place] ([place]) Interventions     Readmission Risk Interventions No flowsheet data found.

## 2019-01-03 NOTE — Progress Notes (Cosign Needed)
    Durable Medical Equipment  (From admission, onward)         Start     Ordered   01/03/19 1600  For home use only DME lightweight manual wheelchair with seat cushion  Once    Comments: Patient suffers from weakness which impairs their ability to perform daily activities like walking in the home.  A rolling walker  will not resolve  issue with performing activities of daily living. A wheelchair will allow patient to safely perform daily activities. Patient is not able to propel themselves in the home using a standard weight wheelchair due to weakness. Patient can self propel in the lightweight wheelchair. Length of need lifetime. Accessories: elevating leg rests (ELRs), wheel locks, extensions and anti-tippers.   01/03/19 1601

## 2019-01-03 NOTE — Progress Notes (Signed)
SATURATION QUALIFICATIONS: (This note is used to comply with regulatory documentation for home oxygen)  Patient Saturations on Room Air at Rest = 100%  Patient Saturations on Room Air while Ambulating = 98%   Please briefly explain why patient needs home oxygen: pt does not meet requirements for home oxygen

## 2019-01-03 NOTE — Evaluation (Signed)
Occupational Therapy Evaluation Patient Details Name: Brenda [name] MRN: [id number] DOB: 01/01/1932 Today's Date: 01/03/2019    History of Present Illness 85 y.o. female presenting from clinic with SOB and palpitations consistent with acute hypoxic respiratory failure. PMH is significant for afib (Xarelto), seizures, HTN, HLD, GERD, anemia and arthritis.   Clinical Impression   Pt with decline in function and safety with ADLs and ADL mobility with impaired strength, balance and endurance. Pt ans her daughter reports that PTA, pt lived at home with her daughter and son and that her daughter works from 11-8 but has caregiver come in to assist pt meals, with bathing, sup for dressing and for overall safety. Pt currently requires set up/sup with UB ADLs, mod A with LB ADLs, min A with toileting and min guard A  sit - stand, min A  stand - sit as pt attempting to sit far from chair/bed/toilet. Min A to assist with contorl of RW as pt pushes RW too far out from her body (daughter says she does this at home). Pt and daughter adamant about pt returning home and do not want ST SNF rehab. All education completed and no further acute OT is indicated at this time    Follow Up Recommendations  Home health OT;Supervision/Assistance - 24 hour    Equipment Recommendations  None recommended by OT    Recommendations for Other Services       Precautions / Restrictions Precautions Precautions: Fall Restrictions Weight Bearing Restrictions: No      Mobility Bed Mobility Overal bed mobility: Needs Assistance Bed Mobility: Sit to Supine     Supine to sit: Supervision Sit to supine: Min assist   General bed mobility comments: pt in recliner uon arrival, assit with LEs back onto bed  Transfers Overall transfer level: Needs assistance Equipment used: Rolling walker (2 wheeled) Transfers: Sit to/from Stand Sit to Stand: Min assist;Min guard         General transfer comment: min guard A  sit  - stand, min A  stand - sit as pt attempting to sit far from chair/bed/toilet. Min A to assist with contorl of RW as pt pushes RW too far out from her body (daughter says she does this at home)    Balance Overall balance assessment: Needs assistance Sitting-balance support: Bilateral upper extremity supported;Feet supported Sitting balance-Leahy Scale: Fair Sitting balance - Comments: pt requiring minA to maintain static and dynamic sitting balance   Standing balance support: Bilateral upper extremity supported;During functional activity;Single extremity supported Standing balance-Leahy Scale: Poor Standing balance comment: pt requiring minG-minA to maintain standing balance with use of RW                           ADL either performed or assessed with clinical judgement   ADL Overall ADL's : Needs assistance/impaired Eating/Feeding: Independent;Sitting   Grooming: Wash/dry hands;Wash/dry face;Min guard;Standing;With caregiver independent assisting   Upper Body Bathing: Set up;Supervision/ safety;Sitting;With caregiver independent assisting   Lower Body Bathing: Moderate assistance;With caregiver independent assisting;Sitting/lateral leans;Sit to/from stand   Upper Body Dressing : Supervision/safety;Set up;Sitting;With caregiver independent assisting   Lower Body Dressing: Moderate assistance;Sitting/lateral leans;Sit to/from stand;With caregiver independent assisting   Toilet Transfer: Minimal assistance;Min guard;Ambulation;RW;Cueing for safety;Cueing for sequencing;Regular Toilet;Grab bars   Toileting- Clothing Manipulation and Hygiene: Minimal assistance;Sit to/from stand;With caregiver independent assisting       Functional mobility during ADLs: Minimal assistance;Min guard;Rolling walker;Cueing for safety;Cueing for sequencing General ADL Comments: pt  requires assist from caregiver at home     Vision Baseline Vision/History: Wears glasses Wears Glasses: At all  times Patient Visual Report: No change from baseline       Perception     Praxis      Pertinent Vitals/Pain Pain Assessment: No/denies pain     Hand Dominance Right   Extremity/Trunk Assessment Upper Extremity Assessment Upper Extremity Assessment: Generalized weakness   Lower Extremity Assessment Lower Extremity Assessment: Defer to PT evaluation   Cervical / Trunk Assessment Cervical / Trunk Assessment: Kyphotic   Communication Communication Communication: HOH   Cognition Arousal/Alertness: Awake/alert Behavior During Therapy: WFL for tasks assessed/performed Overall Cognitive Status: No family/caregiver present to determine baseline cognitive functioning                                     General Comments       Exercises     Shoulder Instructions      Home Living Family/patient expects to be discharged to:: Private residence Living Arrangements: Children Available Help at Discharge: Family;Available PRN/intermittently;Personal care attendant Type of Home: House Home Access: Stairs to enter [employer] of Steps: 1 Entrance Stairs-Rails: Can reach both Home Layout: Two level;Able to live on main level with bedroom/bathroom Alternate Level Stairs-Number of Steps: 12 Alternate Level Stairs-Rails: Can reach both Bathroom Shower/Tub: Occupational psychologist: Standard Bathroom Accessibility: Yes   Home Equipment: Environmental consultant - 4 wheels;Shower seat;Bedside commode;Hand held shower head;Grab bars - tub/shower          Prior Functioning/Environment Level of Independence: Needs assistance  Gait / Transfers Assistance Needed: Pt independently ambulates short household distances with rollator but requries assistance for full household and limited community distances ADL's / Homemaking Assistance Needed: has assist with showers and sup with dressing            OT Problem List: Decreased strength;Impaired balance (sitting  and/or standing);Decreased knowledge of precautions;Decreased activity tolerance;Decreased knowledge of use of DME or AE      OT Treatment/Interventions:      OT Goals(Current goals can be found in the care plan section) Acute Rehab OT Goals Patient Stated Goal: To go home OT Goal Formulation: With patient/family  OT Frequency:     Barriers to D/C:            Co-evaluation              AM-PAC OT "6 Clicks" Daily Activity     Outcome Measure Help from another person eating meals?: None Help from another person taking care of personal grooming?: A Little Help from another person toileting, which includes using toliet, bedpan, or urinal?: A Little Help from another person bathing (including washing, rinsing, drying)?: A Lot Help from another person to put on and taking off regular upper body clothing?: A Little Help from another person to put on and taking off regular lower body clothing?: A Lot 6 Click Score: 17   End of Session Equipment Utilized During Treatment: Gait belt;Rolling walker  Activity Tolerance: Patient tolerated treatment well Patient left: in bed;with call bell/phone within reach;with bed alarm set;with family/visitor present  OT Visit Diagnosis: Unsteadiness on feet (R26.81);Other abnormalities of gait and mobility (R26.89);Muscle weakness (generalized) (M62.81);History of falling (Z91.81)                Time: [id number] OT Time Calculation (min): 31 min Charges:  OT General Charges $OT  Visit: 1 Visit OT Evaluation $OT Eval Moderate Complexity: 1 Mod OT Treatments $Self Care/Home Management : 8-22 mins    Britt Bottom 01/03/2019, 1:57 PM

## 2019-01-03 NOTE — Progress Notes (Signed)
Patient with no complaint today.  She seems to be back at her baseline. PT to evaluate for d/c recs. Unclear if she and her daughter will want her to go to SNF since she has good care and support at home. The resident will discuss with her daughter prior to d/c home if SNF is recommended. She is otherwise stable for discharge home.  I will cosign resident's note one it is completed.

## 2019-01-03 NOTE — Evaluation (Signed)
Physical Therapy Evaluation Patient Details Name: Brenda [name] MRN: [id number] DOB: 03-07-1932 Today's Date: 01/03/2019   History of Present Illness  85 y.o. female presenting from clinic with SOB and palpitations consistent with acute hypoxic respiratory failure. PMH is significant for afib (Xarelto), seizures, HTN, HLD, GERD, anemia and arthritis.  Clinical Impression  Pt demonstrates deficits in gait, functional mobility, balance, endurance, strength/power. Pt with history of frequent falls over the last few months and requiring physical assistance for all OOB activity to reduce falls risk, especially during transfers. Pt with significant endurance deficits, only tolerating short household distances at this time. Pt fatigues quickly and begins to try to initiate transfer attempts to chair or bed when she is too far from surface, requiring physical support to safely reach the destination. Pt will benefit from continued acute PT services to reduce falls risk. PT recommends SNF. If pt and family decline placement then PT recommends home with home health PT and manual wheelchair to reduce falls risk over prolonged distances.    Follow Up Recommendations SNF;Home health PT;Supervision/Assistance - 24 hour(SNF vs HHPT with 24/7 assistance for all OOB activity)    Equipment Recommendations  Wheelchair (measurements PT)    Recommendations for Other Services       Precautions / Restrictions Precautions Precautions: Fall Restrictions Weight Bearing Restrictions: No      Mobility  Bed Mobility Overal bed mobility: Needs Assistance Bed Mobility: Supine to Sit     Supine to sit: Supervision        Transfers Overall transfer level: Needs assistance Equipment used: Rolling walker (2 wheeled) Transfers: Sit to/from Stand Sit to Stand: Min guard;Mod assist         General transfer comment: minG sit to stand, modA stand to sit as pt attempting to sit far from chair/bed despite PT  cueing  Ambulation/Gait Ambulation/Gait assistance: Min assist Gait Distance (Feet): 20 Feet Assistive device: Rolling walker (2 wheeled) Gait Pattern/deviations: Step-to pattern;Shuffle;Trunk flexed Gait velocity: reduced Gait velocity interpretation: <1.31 ft/sec, indicative of household ambulator General Gait Details: short step to gait with singificant trunk flexion  Stairs            Wheelchair Mobility    Modified Rankin (Stroke Patients Only)       Balance Overall balance assessment: Needs assistance Sitting-balance support: Bilateral upper extremity supported;Feet supported Sitting balance-Leahy Scale: Poor Sitting balance - Comments: pt requiring minA to maintain static and dynamic sitting balance   Standing balance support: Bilateral upper extremity supported Standing balance-Leahy Scale: Poor Standing balance comment: pt requiring minG-minA to maintain standing balance with use of RW                             Pertinent Vitals/Pain Pain Assessment: No/denies pain    Home Living Family/patient expects to be discharged to:: Private residence Living Arrangements: Children Available Help at Discharge: Family;Available PRN/intermittently;Personal care attendant Type of Home: House Home Access: Stairs to enter Entrance Stairs-Rails: Can reach both Entrance Stairs-Number of Steps: 1 Home Layout: Two level;Able to live on main level with bedroom/bathroom Home Equipment: Walker - 4 wheels;Shower seat;Bedside commode      Prior Function Level of Independence: Needs assistance   Gait / Transfers Assistance Needed: Pt independently ambulates short household distances with rollator but requries assistance for full household and limited community distances           Hand Dominance        Extremity/Trunk Assessment  Upper Extremity Assessment Upper Extremity Assessment: Generalized weakness    Lower Extremity Assessment Lower Extremity  Assessment: Generalized weakness(grossly 4-/5 BLE)    Cervical / Trunk Assessment Cervical / Trunk Assessment: Kyphotic  Communication   Communication: HOH  Cognition Arousal/Alertness: Awake/alert Behavior During Therapy: WFL for tasks assessed/performed Overall Cognitive Status: No family/caregiver present to determine baseline cognitive functioning                                        General Comments      Exercises     Assessment/Plan    PT Assessment Patient needs continued PT services  PT Problem List Decreased strength;Decreased activity tolerance;Decreased balance;Decreased mobility;Decreased knowledge of use of DME;Decreased safety awareness;Cardiopulmonary status limiting activity       PT Treatment Interventions DME instruction;Gait training;Stair training;Functional mobility training;Therapeutic activities;Therapeutic exercise;Balance training;Neuromuscular re-education;Patient/family education    PT Goals (Current goals can be found in the Care Plan section)  Acute Rehab PT Goals Patient Stated Goal: To go home PT Goal Formulation: With patient Time For Goal Achievement: 01/17/19 Potential to Achieve Goals: Good    Frequency Min 2X/week   Barriers to discharge        Co-evaluation               AM-PAC PT "6 Clicks" Mobility  Outcome Measure Help needed turning from your back to your side while in a flat bed without using bedrails?: None Help needed moving from lying on your back to sitting on the side of a flat bed without using bedrails?: None Help needed moving to and from a bed to a chair (including a wheelchair)?: A Little Help needed standing up from a chair using your arms (e.g., wheelchair or bedside chair)?: A Little Help needed to walk in hospital room?: A Little Help needed climbing 3-5 steps with a railing? : A Lot 6 Click Score: 19    End of Session Equipment Utilized During Treatment: Gait belt Activity  Tolerance: Patient limited by fatigue Patient left: in chair;with call bell/phone within reach;with chair alarm set;with nursing/sitter in room Nurse Communication: Mobility status PT Visit Diagnosis: Other abnormalities of gait and mobility (R26.89)    Time: [id number] PT Time Calculation (min) (ACUTE ONLY): 31 min   Charges:   PT Evaluation $PT Eval Low Complexity: 1 Low PT Treatments $Gait Training: 8-22 mins        Zenaida Niece, PT, DPT Acute Rehabilitation Pager: [phone]   Zenaida Niece 01/03/2019, 11:44 AM

## 2019-01-06 LAB — CULTURE, BLOOD (ROUTINE X 2)
Culture: NO GROWTH
Culture: NO GROWTH
Special Requests: ADEQUATE
Special Requests: ADEQUATE

## 2019-01-08 ENCOUNTER — Telehealth: Payer: Self-pay | Admitting: Family Medicine

## 2019-01-08 NOTE — Telephone Encounter (Signed)
Forwarding to pcp

## 2019-01-08 NOTE — Telephone Encounter (Signed)
Michael from [facility] is calling for verbal orders from Dr. Enid Derry. She was seen by her on 10/06.  Home health PT  2 x week 4 weeks.  The best call back is [phone]

## 2019-01-09 ENCOUNTER — Other Ambulatory Visit: Payer: Self-pay

## 2019-01-09 ENCOUNTER — Ambulatory Visit (INDEPENDENT_AMBULATORY_CARE_PROVIDER_SITE_OTHER): Payer: Medicare Other | Admitting: Family Medicine

## 2019-01-09 DIAGNOSIS — I482 Chronic atrial fibrillation, unspecified: Secondary | ICD-10-CM

## 2019-01-09 NOTE — Patient Instructions (Signed)
It was great meeting you today!  Your heart rate is really well controlled today.  While your blood pressure is a little lower than normal I think the fact that you did not have any symptoms with physical therapy or today is a good sign.  I would keep your fall appointment with cardiology in November.  Otherwise these are looking really good and whether you are doing a lot better.

## 2019-01-09 NOTE — Telephone Encounter (Signed)
Verbal ok given to Legrand Como.  Georgiana Spillane,CMA

## 2019-01-09 NOTE — Telephone Encounter (Signed)
approved

## 2019-01-11 NOTE — Progress Notes (Signed)
   HPI 85 year old female who presents for hospital follow-up.  Patient was admitted on 01/03/2019 for respiratory failure.  Respiratory failure was attributed to her A. fib with RVR.  Per the daughter's report they had run out of metoprolol earlier that day that she missed 2 doses causing this issue.  She is restarted on metoprolol 100 mg twice daily, verapamil extended release to 40 mg nightly.  States that she has been doing much better.  She is asymptomatic, with no respiratory issues.  She worked with PT on 01/08/2019.  She was able to do a variety of standing exercises without any dizziness or shortness of breath.  CC: Hospital follow-up   ROS:   Review of Systems See HPI for ROS.   CC, SH/smoking status, and VS noted  Objective: BP 100/65   Pulse 67   SpO2 55%  Gen: 85 year old Caucasian female, no acute distress, resting comfortably CV: Regular rate and rhythm, no M/R/G, palpable radial pulse Resp: Lungs clear to auscultation bilaterally, no accessory muscle use Neuro: Alert and oriented, Speech clear, No gross deficits   Assessment and plan:  Chronic atrial fibrillation (HCC) Well-controlled the standpoint on metoprolol 100 mg twice daily, diltiazem 240 mg XR.  Heart rate 67 in clinic.  No symptoms since leaving hospital.  Has follow-up with cardiology early next month, encouraged to keep that appointment.  Recommending discussing continuation of Xarelto with PCP.   No orders of the defined types were placed in this encounter.   No orders of the defined types were placed in this encounter.    Guadalupe Dawn MD PGY-3 Family Medicine Resident  01/11/2019 9:11 AM

## 2019-01-11 NOTE — Assessment & Plan Note (Addendum)
Well-controlled the standpoint on metoprolol 100 mg twice daily, diltiazem 240 mg XR.  Heart rate 67 in clinic.  No symptoms since leaving hospital.  Has follow-up with cardiology early next month, encouraged to keep that appointment.  Recommending discussing continuation of Xarelto with PCP.

## 2019-01-14 ENCOUNTER — Encounter (HOSPITAL_COMMUNITY): Payer: Self-pay | Admitting: *Deleted

## 2019-01-14 ENCOUNTER — Telehealth: Payer: Self-pay | Admitting: *Deleted

## 2019-01-14 ENCOUNTER — Inpatient Hospital Stay (HOSPITAL_COMMUNITY)
Admission: EM | Admit: 2019-01-14 | Discharge: 2019-01-17 | DRG: 690 | Disposition: A | Discharge status: Home Health | Payer: Medicare Other | Attending: Family Medicine | Admitting: Family Medicine

## 2019-01-14 ENCOUNTER — Other Ambulatory Visit: Payer: Self-pay

## 2019-01-14 DIAGNOSIS — A419 Sepsis, unspecified organism: Secondary | ICD-10-CM

## 2019-01-14 DIAGNOSIS — Z20828 Contact with and (suspected) exposure to other viral communicable diseases: Secondary | ICD-10-CM | POA: Diagnosis present

## 2019-01-14 DIAGNOSIS — N3281 Overactive bladder: Secondary | ICD-10-CM | POA: Diagnosis present

## 2019-01-14 DIAGNOSIS — Z8249 Family history of ischemic heart disease and other diseases of the circulatory system: Secondary | ICD-10-CM

## 2019-01-14 DIAGNOSIS — Z87440 Personal history of urinary (tract) infections: Secondary | ICD-10-CM

## 2019-01-14 DIAGNOSIS — R6510 Systemic inflammatory response syndrome (SIRS) of non-infectious origin without acute organ dysfunction: Secondary | ICD-10-CM

## 2019-01-14 DIAGNOSIS — W19XXXA Unspecified fall, initial encounter: Secondary | ICD-10-CM

## 2019-01-14 DIAGNOSIS — M8580 Other specified disorders of bone density and structure, unspecified site: Secondary | ICD-10-CM | POA: Diagnosis present

## 2019-01-14 DIAGNOSIS — M47812 Spondylosis without myelopathy or radiculopathy, cervical region: Secondary | ICD-10-CM | POA: Diagnosis present

## 2019-01-14 DIAGNOSIS — F329 Major depressive disorder, single episode, unspecified: Secondary | ICD-10-CM | POA: Diagnosis present

## 2019-01-14 DIAGNOSIS — Z66 Do not resuscitate: Secondary | ICD-10-CM | POA: Diagnosis present

## 2019-01-14 DIAGNOSIS — Z803 Family history of malignant neoplasm of breast: Secondary | ICD-10-CM

## 2019-01-14 DIAGNOSIS — Z833 Family history of diabetes mellitus: Secondary | ICD-10-CM

## 2019-01-14 DIAGNOSIS — Z8673 Personal history of transient ischemic attack (TIA), and cerebral infarction without residual deficits: Secondary | ICD-10-CM

## 2019-01-14 DIAGNOSIS — E119 Type 2 diabetes mellitus without complications: Secondary | ICD-10-CM | POA: Diagnosis present

## 2019-01-14 DIAGNOSIS — I10 Essential (primary) hypertension: Secondary | ICD-10-CM | POA: Diagnosis present

## 2019-01-14 DIAGNOSIS — Z6825 Body mass index (BMI) 25.0-25.9, adult: Secondary | ICD-10-CM

## 2019-01-14 DIAGNOSIS — N390 Urinary tract infection, site not specified: Principal | ICD-10-CM

## 2019-01-14 DIAGNOSIS — Z9181 History of falling: Secondary | ICD-10-CM

## 2019-01-14 DIAGNOSIS — Z95810 Presence of automatic (implantable) cardiac defibrillator: Secondary | ICD-10-CM

## 2019-01-14 DIAGNOSIS — D649 Anemia, unspecified: Secondary | ICD-10-CM | POA: Diagnosis present

## 2019-01-14 DIAGNOSIS — Z7901 Long term (current) use of anticoagulants: Secondary | ICD-10-CM

## 2019-01-14 DIAGNOSIS — E785 Hyperlipidemia, unspecified: Secondary | ICD-10-CM | POA: Diagnosis present

## 2019-01-14 DIAGNOSIS — Z79899 Other long term (current) drug therapy: Secondary | ICD-10-CM

## 2019-01-14 DIAGNOSIS — I480 Paroxysmal atrial fibrillation: Secondary | ICD-10-CM | POA: Diagnosis present

## 2019-01-14 DIAGNOSIS — R627 Adult failure to thrive: Secondary | ICD-10-CM | POA: Diagnosis present

## 2019-01-14 LAB — BASIC METABOLIC PANEL
Anion gap: 10 (ref 5–15)
BUN: 38 mg/dL — ABNORMAL HIGH (ref 8–23)
CO2: 18 mmol/L — ABNORMAL LOW (ref 22–32)
Calcium: 8.2 mg/dL — ABNORMAL LOW (ref 8.9–10.3)
Chloride: 102 mmol/L (ref 98–111)
Creatinine, Ser: 1.51 mg/dL — ABNORMAL HIGH (ref 0.44–1.00)
GFR calc Af Amer: 36 mL/min — ABNORMAL LOW (ref >60)
GFR calc non Af Amer: 31 mL/min — ABNORMAL LOW (ref >60)
Glucose, Bld: 150 mg/dL — ABNORMAL HIGH (ref 70–99)
Potassium: 4.4 mmol/L (ref 3.5–5.1)
Sodium: 130 mmol/L — ABNORMAL LOW (ref 135–145)

## 2019-01-14 LAB — CBC
HCT: 31.2 % — ABNORMAL LOW (ref 36.0–46.0)
Hemoglobin: 10.4 g/dL — ABNORMAL LOW (ref 12.0–15.0)
MCH: 34 pg (ref 26.0–34.0)
MCHC: 33.3 g/dL (ref 30.0–36.0)
MCV: 102 fL — ABNORMAL HIGH (ref 80.0–100.0)
Platelets: 263 10*3/uL (ref 150–400)
RBC: 3.06 MIL/uL — ABNORMAL LOW (ref 3.87–5.11)
RDW: 13.5 % (ref 11.5–15.5)
WBC: 12.2 10*3/uL — ABNORMAL HIGH (ref 4.0–10.5)
nRBC: 0 % (ref 0.0–0.2)

## 2019-01-14 MED ORDER — SODIUM CHLORIDE 0.9% FLUSH
3.0000 mL | Freq: Once | INTRAVENOUS | Status: AC
  Administered 2019-01-15: 3 mL via INTRAVENOUS

## 2019-01-14 NOTE — ED Triage Notes (Signed)
Pt has a home health RN that noticed concentrated urine today. Pt c/o back pain, lower abd pain, and has her L leg giving out on her. Has had two falls at home; walks with a walker. Daughter reports weakness since Thursday

## 2019-01-14 NOTE — Telephone Encounter (Signed)
Eustace Moore from [facility] calling for OT verbal orders as follows:  2 time(s) weekly for 3 week(s)  You can leave verbal orders on confidential voicemail.  Christen Bame, CMA

## 2019-01-14 NOTE — Telephone Encounter (Signed)
Received call from [facility] PT) pt is c/o of back pain, abd pain and dark urine.  Contacted [name] (daughter) who states that "her mom tells her nothing but that is what she was told).  Advised with these symptoms, history of UTI and her age that she should be evaluated tonight.   [name] agreeable. Christen Bame, CMA

## 2019-01-15 ENCOUNTER — Emergency Department (HOSPITAL_COMMUNITY): Payer: Medicare Other

## 2019-01-15 ENCOUNTER — Other Ambulatory Visit: Payer: Self-pay

## 2019-01-15 ENCOUNTER — Inpatient Hospital Stay (HOSPITAL_COMMUNITY): Payer: Medicare Other

## 2019-01-15 DIAGNOSIS — Z8249 Family history of ischemic heart disease and other diseases of the circulatory system: Secondary | ICD-10-CM | POA: Diagnosis not present

## 2019-01-15 DIAGNOSIS — N390 Urinary tract infection, site not specified: Principal | ICD-10-CM

## 2019-01-15 DIAGNOSIS — Z833 Family history of diabetes mellitus: Secondary | ICD-10-CM | POA: Diagnosis not present

## 2019-01-15 DIAGNOSIS — A419 Sepsis, unspecified organism: Secondary | ICD-10-CM | POA: Diagnosis present

## 2019-01-15 DIAGNOSIS — D649 Anemia, unspecified: Secondary | ICD-10-CM | POA: Diagnosis present

## 2019-01-15 DIAGNOSIS — E785 Hyperlipidemia, unspecified: Secondary | ICD-10-CM | POA: Diagnosis present

## 2019-01-15 DIAGNOSIS — N3281 Overactive bladder: Secondary | ICD-10-CM | POA: Diagnosis present

## 2019-01-15 DIAGNOSIS — Z87440 Personal history of urinary (tract) infections: Secondary | ICD-10-CM | POA: Diagnosis not present

## 2019-01-15 DIAGNOSIS — M8580 Other specified disorders of bone density and structure, unspecified site: Secondary | ICD-10-CM | POA: Diagnosis present

## 2019-01-15 DIAGNOSIS — E119 Type 2 diabetes mellitus without complications: Secondary | ICD-10-CM | POA: Diagnosis present

## 2019-01-15 DIAGNOSIS — R627 Adult failure to thrive: Secondary | ICD-10-CM | POA: Diagnosis present

## 2019-01-15 DIAGNOSIS — W19XXXD Unspecified fall, subsequent encounter: Secondary | ICD-10-CM | POA: Diagnosis not present

## 2019-01-15 DIAGNOSIS — Z6825 Body mass index (BMI) 25.0-25.9, adult: Secondary | ICD-10-CM | POA: Diagnosis not present

## 2019-01-15 DIAGNOSIS — Z803 Family history of malignant neoplasm of breast: Secondary | ICD-10-CM | POA: Diagnosis not present

## 2019-01-15 DIAGNOSIS — I480 Paroxysmal atrial fibrillation: Secondary | ICD-10-CM | POA: Diagnosis present

## 2019-01-15 DIAGNOSIS — Z95810 Presence of automatic (implantable) cardiac defibrillator: Secondary | ICD-10-CM | POA: Diagnosis not present

## 2019-01-15 DIAGNOSIS — I10 Essential (primary) hypertension: Secondary | ICD-10-CM | POA: Diagnosis present

## 2019-01-15 DIAGNOSIS — Z9181 History of falling: Secondary | ICD-10-CM | POA: Diagnosis not present

## 2019-01-15 DIAGNOSIS — Z66 Do not resuscitate: Secondary | ICD-10-CM | POA: Diagnosis present

## 2019-01-15 DIAGNOSIS — F329 Major depressive disorder, single episode, unspecified: Secondary | ICD-10-CM | POA: Diagnosis present

## 2019-01-15 DIAGNOSIS — Z7901 Long term (current) use of anticoagulants: Secondary | ICD-10-CM | POA: Diagnosis not present

## 2019-01-15 DIAGNOSIS — Z8673 Personal history of transient ischemic attack (TIA), and cerebral infarction without residual deficits: Secondary | ICD-10-CM | POA: Diagnosis not present

## 2019-01-15 DIAGNOSIS — Z79899 Other long term (current) drug therapy: Secondary | ICD-10-CM | POA: Diagnosis not present

## 2019-01-15 DIAGNOSIS — R6510 Systemic inflammatory response syndrome (SIRS) of non-infectious origin without acute organ dysfunction: Secondary | ICD-10-CM | POA: Diagnosis not present

## 2019-01-15 DIAGNOSIS — M47812 Spondylosis without myelopathy or radiculopathy, cervical region: Secondary | ICD-10-CM | POA: Diagnosis present

## 2019-01-15 DIAGNOSIS — Z20828 Contact with and (suspected) exposure to other viral communicable diseases: Secondary | ICD-10-CM | POA: Diagnosis present

## 2019-01-15 LAB — URINALYSIS, ROUTINE W REFLEX MICROSCOPIC
Bilirubin Urine: NEGATIVE
Glucose, UA: NEGATIVE mg/dL
Hgb urine dipstick: NEGATIVE
Ketones, ur: NEGATIVE mg/dL
Nitrite: NEGATIVE
Protein, ur: 30 mg/dL — AB
Specific Gravity, Urine: 1.013 (ref 1.005–1.030)
WBC, UA: >50 WBC/hpf — ABNORMAL HIGH (ref 0–5)
pH: 5 (ref 5.0–8.0)

## 2019-01-15 LAB — URINE CULTURE: Culture: NO GROWTH

## 2019-01-15 LAB — SARS CORONAVIRUS 2 (TAT 6-24 HRS): SARS Coronavirus 2: NEGATIVE

## 2019-01-15 LAB — MAGNESIUM: Magnesium: 1.9 mg/dL (ref 1.7–2.4)

## 2019-01-15 LAB — PHOSPHORUS: Phosphorus: 3.1 mg/dL (ref 2.5–4.6)

## 2019-01-15 LAB — LACTIC ACID, PLASMA
Lactic Acid, Venous: 0.9 mmol/L (ref 0.5–1.9)
Lactic Acid, Venous: 2.3 mmol/L (ref 0.5–1.9)

## 2019-01-15 LAB — TSH: TSH: 4.915 u[IU]/mL — ABNORMAL HIGH (ref 0.350–4.500)

## 2019-01-15 MED ORDER — SODIUM CHLORIDE 0.9 % IV BOLUS (SEPSIS)
500.0000 mL | Freq: Once | INTRAVENOUS | Status: AC
  Administered 2019-01-15: 500 mL via INTRAVENOUS

## 2019-01-15 MED ORDER — VITAMIN D 25 MCG (1000 UNIT) PO TABS
500.0000 [IU] | ORAL_TABLET | Freq: Every day | ORAL | Status: DC
  Administered 2019-01-15 – 2019-01-17 (×3): 500 [IU] via ORAL
  Filled 2019-01-15 (×3): qty 1

## 2019-01-15 MED ORDER — SODIUM CHLORIDE 0.9 % IV SOLN
1.0000 g | INTRAVENOUS | Status: DC
  Administered 2019-01-15: 1 g via INTRAVENOUS
  Filled 2019-01-15: qty 10

## 2019-01-15 MED ORDER — RIVAROXABAN 15 MG PO TABS
15.0000 mg | ORAL_TABLET | Freq: Every day | ORAL | Status: DC
  Administered 2019-01-15 – 2019-01-17 (×3): 15 mg via ORAL
  Filled 2019-01-15 (×4): qty 1

## 2019-01-15 MED ORDER — SODIUM CHLORIDE 0.9 % IV SOLN: INTRAVENOUS | Status: DC | PRN

## 2019-01-15 MED ORDER — SODIUM CHLORIDE 0.9 % IV SOLN
2.0000 g | Freq: Once | INTRAVENOUS | Status: AC
  Administered 2019-01-15: 2 g via INTRAVENOUS
  Filled 2019-01-15: qty 2

## 2019-01-15 MED ORDER — VANCOMYCIN HCL IN DEXTROSE 1-5 GM/200ML-% IV SOLN
1000.0000 mg | Freq: Once | INTRAVENOUS | Status: AC
  Administered 2019-01-15: 1000 mg via INTRAVENOUS
  Filled 2019-01-15: qty 200

## 2019-01-15 MED ORDER — SODIUM CHLORIDE 0.9 % IV BOLUS
1000.0000 mL | Freq: Once | INTRAVENOUS | Status: AC
  Administered 2019-01-15: 1000 mL via INTRAVENOUS

## 2019-01-15 MED ORDER — VANCOMYCIN HCL IN DEXTROSE 1-5 GM/200ML-% IV SOLN: 1000.0000 mg | Freq: Once | INTRAVENOUS | Status: DC

## 2019-01-15 MED ORDER — FERROUS SULFATE 325 (65 FE) MG PO TABS
325.0000 mg | ORAL_TABLET | ORAL | Status: DC
  Administered 2019-01-16: 325 mg via ORAL
  Filled 2019-01-15: qty 1

## 2019-01-15 MED ORDER — ACETAMINOPHEN 500 MG PO TABS
500.0000 mg | ORAL_TABLET | Freq: Four times a day (QID) | ORAL | Status: DC | PRN
  Administered 2019-01-16: 500 mg via ORAL
  Filled 2019-01-15: qty 1

## 2019-01-15 MED ORDER — PANTOPRAZOLE SODIUM 20 MG PO TBEC
20.0000 mg | DELAYED_RELEASE_TABLET | Freq: Every day | ORAL | Status: DC
  Administered 2019-01-16 – 2019-01-17 (×2): 20 mg via ORAL
  Filled 2019-01-15 (×2): qty 1

## 2019-01-15 MED ORDER — METOPROLOL TARTRATE 25 MG PO TABS: 100.0000 mg | ORAL_TABLET | Freq: Two times a day (BID) | ORAL | Status: DC

## 2019-01-15 MED ORDER — PROPYLENE GLYCOL 0.6 % OP SOLN: 1.0000 [drp] | Freq: Every day | OPHTHALMIC | Status: DC

## 2019-01-15 MED ORDER — ATORVASTATIN CALCIUM 40 MG PO TABS
40.0000 mg | ORAL_TABLET | Freq: Every day | ORAL | Status: DC
  Administered 2019-01-15 – 2019-01-17 (×3): 40 mg via ORAL
  Filled 2019-01-15 (×3): qty 1

## 2019-01-15 MED ORDER — METRONIDAZOLE IN NACL 5-0.79 MG/ML-% IV SOLN: 500.0000 mg | Freq: Once | INTRAVENOUS | Status: DC

## 2019-01-15 MED ORDER — SUCRALFATE 1 GM/10ML PO SUSP
1.0000 g | Freq: Four times a day (QID) | ORAL | Status: DC | PRN
  Filled 2019-01-15: qty 10

## 2019-01-15 MED ORDER — VERAPAMIL HCL ER 240 MG PO TBCR
240.0000 mg | EXTENDED_RELEASE_TABLET | Freq: Every day | ORAL | Status: DC
  Administered 2019-01-15 – 2019-01-16 (×2): 240 mg via ORAL
  Filled 2019-01-15 (×3): qty 1

## 2019-01-15 MED ORDER — PANTOPRAZOLE SODIUM 40 MG PO TBEC
40.0000 mg | DELAYED_RELEASE_TABLET | Freq: Every day | ORAL | Status: DC
  Administered 2019-01-15: 40 mg via ORAL
  Filled 2019-01-15: qty 1

## 2019-01-15 MED ORDER — SODIUM CHLORIDE 0.9 % IV BOLUS (SEPSIS)
1000.0000 mL | Freq: Once | INTRAVENOUS | Status: AC
  Administered 2019-01-15: 1000 mL via INTRAVENOUS

## 2019-01-15 MED ORDER — POLYVINYL ALCOHOL 1.4 % OP SOLN
1.0000 [drp] | Freq: Every day | OPHTHALMIC | Status: DC
  Administered 2019-01-17: 1 [drp] via OPHTHALMIC
  Filled 2019-01-15 (×2): qty 15

## 2019-01-15 NOTE — Telephone Encounter (Signed)
Called and gave approval

## 2019-01-15 NOTE — ED Notes (Signed)
Lunch Tray Ordered @ 1022. 

## 2019-01-15 NOTE — ED Provider Notes (Signed)
TIME SEEN: 2:09 AM  CHIEF COMPLAINT: Weakness, concern for UTI  HPI: Patient is an 85 year old female with history of A. fib on Xarelto, hypertension, hyperlipidemia who presents to the emergency department with generalized weakness and concerns for UTI.  Patient's daughter provides most of the history.  She states that patient has been weak for the past several days and has had 2 falls.  One fall she slipped onto the floor while trying to get into bed.  The other fall she slipped off the toilet onto the ground.  Both falls she went to her bottom and daughter does not think she hit her head.  Daughter has a home health nurse that comes to help take care of the patient in the day and they noticed that her urine appeared very concentrated.  Daughter reports similar symptoms with previous UTI.  She states she had a UTI about a month ago and finished antibiotics.  No known fevers, cough, vomiting, diarrhea.  Patient complains of back pain but unable to tell me how long this has been present.  Daughter reports she has had multiple back surgeries.  Patient complains of feeling like her left side is weak but no numbness or tingling.  She denies any headache.  Denies any chest pain or shortness of breath.  Was just admitted to the hospital for shortness of breath and hypoxia.  Had negative CTA of the chest and clear chest x-rays.  Daughter also told tech that patient has been expressing thoughts of wanting to die.  Her husband died 1 year ago and since the anniversary of his death she has been stating that she does not want to be here anymore and just wants to be with her husband.  ROS: See HPI Constitutional: no fever  Eyes: no drainage  ENT: no runny nose   Cardiovascular:  no chest pain  Resp: no SOB  GI: no vomiting GU: no dysuria Integumentary: no rash  Allergy: no hives  Musculoskeletal: no leg swelling  Neurological: no slurred speech ROS otherwise negative  PAST MEDICAL HISTORY/PAST SURGICAL  HISTORY:  Past Medical History:  Diagnosis Date  . A-fib ([place])   . Anemia   . Arthritis   . Basal cell carcinoma    nose  . Diabetes mellitus without complication ([place])    pt denies diabetes  . GERD (gastroesophageal reflux disease)   . Hiatal hernia   . HLD (hyperlipidemia)   . Hypertension   . Seizure [facility]) 2011    MEDICATIONS:  Prior to Admission medications   Medication Sig Start Date End Date Taking? Authorizing Provider  acetaminophen (TYLENOL) 500 MG tablet Take 500-1,000 mg by mouth every 6 (six) hours as needed for mild pain.    [provider]  atorvastatin (LIPITOR) 40 MG tablet TAKE 1 TABLET(40 MG) BY MOUTH DAILY Patient taking differently: Take 40 mg by mouth daily.  10/31/18   Camnitz, Ocie Doyne, MD  ferrous sulfate 325 (65 FE) MG tablet Take 325 mg by mouth 3 (three) times a week. Monday, Wed, Fri.    [provider]  furosemide (LASIX) 20 MG tablet Take 1 tablet (20 mg total) by mouth daily as needed. 10/08/18   Camnitz, Ocie Doyne, MD  metoprolol tartrate (LOPRESSOR) 100 MG tablet Take 1 tablet (100 mg total) by mouth 2 (two) times daily. 01/02/19   Benay Pike, MD  metoprolol tartrate (LOPRESSOR) 100 MG tablet Take 1 tablet (100 mg total) by mouth 2 (two) times daily. 01/03/19  Rory Percy, DO  omeprazole (PRILOSEC) 40 MG capsule Take 1 capsule (40 mg total) by mouth 2 (two) times daily. Take before breakfast and dinner. 07/24/18   Armbruster, Carlota Raspberry, MD  Propylene Glycol (SYSTANE COMPLETE) 0.6 % SOLN Place 1 drop into both eyes daily.    [provider]  sucralfate (CARAFATE) 1 GM/10ML suspension TAKE 10 ML (1 GRAM TOTAL) EVERY 6 HOURS AS NEEDED 01/03/19   Rory Percy, DO  tolterodine (DETROL LA) 2 MG 24 hr capsule TAKE 1 CAPSULE(2 MG) BY MOUTH DAILY 01/02/19   Benay Pike, MD  verapamil (CALAN-SR) 240 MG CR tablet Take 1 tablet (240 mg total) by mouth at bedtime. 04/23/18   Camnitz, Ocie Doyne, MD  VITAMIN D PO Take 1  capsule by mouth daily.    [provider]  XARELTO 15 MG TABS tablet TAKE 1 TABLET DAILY WITH SUPPER Patient taking differently: Take 15 mg by mouth daily with supper.  12/31/18   Camnitz, Ocie Doyne, MD    ALLERGIES:  Allergies  Allergen Reactions  . Sotalol Other (See Comments)    Seizures   . Erythromycin Nausea Only  . Augmentin [Amoxicillin-Pot Clavulanate] Rash  . Crestor [Rosuvastatin Calcium] Rash  . Keflex [Cephalexin] Rash  . Septra [Sulfamethoxazole-Trimethoprim] Rash    SOCIAL HISTORY:  Social History   Tobacco Use  . Smoking status: Never Smoker  . Smokeless tobacco: Never Used  Substance Use Topics  . Alcohol use: No    FAMILY HISTORY: Family History  Problem Relation Age of Onset  . Diabetes Mother   . Heart failure Father   . Breast cancer Sister   . Heart failure Sister   . Diabetes Son   . Diabetes Sister   . Colon cancer Neg Hx   . Stomach cancer Neg Hx   . Rectal cancer Neg Hx   . Esophageal cancer Neg Hx     EXAM: BP 95/66 (BP Location: Right Arm)   Pulse 70   Temp 97.6 F (36.4 C) (Oral)   Resp 14   SpO2 99%  CONSTITUTIONAL: Alert and oriented and responds appropriately to questions.  Elderly, hard of hearing HEAD: Normocephalic, atraumatic EYES: Conjunctivae clear, pupils appear equal, EOMI ENT: normal nose; moist mucous membranes NECK: Supple, no meningismus, no nuchal rigidity, no LAD, tender to palpation over the lower cervical spine without step-off or deformity CARD: RRR; S1 and S2 appreciated; no murmurs, no clicks, no rubs, no gallops RESP: Normal chest excursion without splinting or tachypnea; breath sounds clear and equal bilaterally; no wheezes, no rhonchi, no rales, no hypoxia or respiratory distress, speaking full sentences ABD/GI: Normal bowel sounds; non-distended; soft, non-tender, no rebound, no guarding, no peritoneal signs, no hepatosplenomegaly BACK: Tender to palpation diffusely throughout the thoracic and  lumbar spine without step-off or deformity, kyphosis present, no redness or warmth, no swelling or ecchymosis EXT: Normal ROM in all joints; non-tender to palpation; no edema; normal capillary refill; no cyanosis, no calf tenderness or swelling    SKIN: Normal color for age and race; warm; no rash NEURO: Moves all extremities equally, equal strength in all extremities but there is global weakness, cranial nerves II through XII intact, normal sensation diffusely, normal speech PSYCH: The patient's mood and manner are appropriate. Grooming and personal hygiene are appropriate.  MEDICAL DECISION MAKING: Patient here with generalized weakness.  Nonfocal neurologic exam.  Labs show mild leukocytosis.  She has chronic kidney disease which appears to be at her baseline.  Will obtain  urinalysis.  EKG shows no ischemic abnormality.  Will obtain CT of the head, cervical spine, thoracic and lumbar spine given recent falls and complaints of left-sided weakness.  Anticipate admission.  ED PROGRESS: Patient's rectal temperature is low at 97.  She does have a mild leukocytosis here.  He has had some intermittent low blood pressures.  We will add on blood cultures, lactate, chest x-ray them concerned this could be possible sepsis.  Daughter states that her blood pressure does not normally run this low.  Will give a total of 2 L of IV fluids and broad-spectrum antibiotics.  She is currently under a [employer].  Will add on a chest x-ray.  Urinalysis pending.  CT head, cervical spine show no acute abnormality.  Thoracic spine x-ray shows a remote T12 compression fracture but no acute abnormality.  Lumbar x-ray shows a remote L2 compression fracture that is unchanged.   5:00 AM  Patient's urine appears infected.  She has previously grown Enterococcus and E. coli.  Receiving vancomycin and is lactam as she has a penicillin and cephalosporin allergy.  Blood pressure currently 124/48.  Chest x-ray clear.  5:26 AM  Discussed patient's case with family medicine residents, Dr. Volanda Napoleon and Dr. Criss Rosales.  I have recommended admission and patient (and family if present) agree with this plan. Admitting physician will place admission orders.   I reviewed all nursing notes, vitals, pertinent previous records, EKGs, lab and urine results, imaging (as available).      EKG Interpretation  Date/Time:  Tuesday January 15 2019 02:26:23 EDT Ventricular Rate:  70 PR Interval:    QRS Duration: 96 QT Interval:  444 QTC Calculation: 480 R Axis:   -12 Text Interpretation:  Sinus rhythm Ventricular premature complex Borderline prolonged PR interval Abnormal R-wave progression, early transition Left ventricular hypertrophy No significant change since last tracing Confirmed by Pryor Curia [phone]) on 01/15/2019 2:32:33 AM        CRITICAL CARE Performed by: Cyril Mourning Montoya Brandel   Total critical care time: 45 minutes  Critical care time was exclusive of separately billable procedures and treating other patients.  Critical care was necessary to treat or prevent imminent or life-threatening deterioration.  Critical care was time spent personally by me on the following activities: development of treatment plan with patient and/or surrogate as well as nursing, discussions with consultants, evaluation of patient's response to treatment, examination of patient, obtaining history from patient or surrogate, ordering and performing treatments and interventions, ordering and review of laboratory studies, ordering and review of radiographic studies, pulse oximetry and re-evaluation of patient's condition.   Saniyya Neri was evaluated in Emergency Department on 01/15/2019 for the symptoms described in the history of present illness. She was evaluated in the context of the global COVID-19 pandemic, which necessitated consideration that the patient might be at risk for infection with the SARS-CoV-2 virus that causes COVID-19.  Institutional protocols and algorithms that pertain to the evaluation of patients at risk for COVID-19 are in a state of rapid change based on information released by regulatory bodies including the CDC and federal and state organizations. These policies and algorithms were followed during the patient's care in the ED.    Suhaas Agena, Delice Bison, DO 01/15/19 [phone]

## 2019-01-15 NOTE — ED Notes (Signed)
ED TO INPATIENT HANDOFF REPORT  ED Nurse Name and Phone #: William Hamburger [facility]  S Name/Age/Gender Brenda [name] 85 y.o. female Room/Bed: 046C/046C  Code Status   Code Status: DNR  Home/SNF/Other Home Patient oriented to: self, place and time Is this baseline? Yes   Triage Complete: Triage complete  Chief Complaint Lower Bladder Pain; Fatigue  Triage Note Pt has a home health RN that noticed concentrated urine today. Pt c/o back pain, lower abd pain, and has her L leg giving out on her. Has had two falls at home; walks with a walker. Daughter reports weakness since Thursday    Allergies Allergies  Allergen Reactions  . Sotalol Other (See Comments)    Seizures   . Erythromycin Nausea Only  . Augmentin [Amoxicillin-Pot Clavulanate] Rash  . Crestor [Rosuvastatin Calcium] Rash  . Keflex [Cephalexin] Rash    Happened >10 yrs ago, no SOB  . Septra [Sulfamethoxazole-Trimethoprim] Rash    Level of Care/Admitting Diagnosis ED Disposition    ED Disposition Condition [facility] Area: [place] [[id number]]  Level of Care: Med-Surg [16]  Covid Evaluation: Asymptomatic Screening Protocol (No Symptoms)  Diagnosis: [facility]is [id number]  Admitting Physician: Sherene Sires [id number]  Attending Physician: Leeanne Rio [phone]  Estimated length of stay: past midnight tomorrow  Certification:: I certify this patient will need inpatient services for at least 2 midnights  PT Class (Do Not Modify): Inpatient [101]  PT Acc Code (Do Not Modify): Private [1]       B Medical/Surgery History Past Medical History:  Diagnosis Date  . A-fib ([place])   . Anemia   . Arthritis   . Basal cell carcinoma    nose  . Diabetes mellitus without complication ([place])    pt denies diabetes  . GERD (gastroesophageal reflux disease)   . Hiatal hernia   . HLD (hyperlipidemia)   . Hypertension   . Seizure ([place]) 2011   Past Surgical History:  Procedure  Laterality Date  . CARDIAC DEFIBRILLATOR PLACEMENT    . CARDIAC DEFIBRILLATOR PLACEMENT    . CATARACT EXTRACTION Left 02/2001  . CHOLECYSTECTOMY    . COLONOSCOPY WITH PROPOFOL N/A 07/19/2017   Procedure: COLONOSCOPY WITH PROPOFOL;  Surgeon: Jerene Bears, MD;  Location: [facility];  Service: Gastroenterology;  Laterality: N/A;  . ESOPHAGOGASTRODUODENOSCOPY (EGD) WITH PROPOFOL N/A 07/19/2017   Procedure: ESOPHAGOGASTRODUODENOSCOPY (EGD) WITH PROPOFOL;  Surgeon: Jerene Bears, MD;  Location[facility];  Service: Gastroenterology;  Laterality: N/A;  . LUMBAR LAMINECTOMY  06/11/2001  . LUMBAR LAMINECTOMY  02/09/2006  . LUMBAR LAMINECTOMY  07/07/2006   with spianal cord stimulator  . SPINAL CORD STIMULATOR REMOVAL  14/2009  . TUBAL LIGATION       A IV Location/Drains/Wounds Patient Lines/Drains/Airways Status   Active Line/Drains/Airways    Name:   Placement date:   Placement time:   Site:   Days:   Peripheral IV 01/15/19 Right   01/15/19    0332    -   less than 1          Intake/Output Last 24 hours  Intake/Output Summary (Last 24 hours) at 01/15/2019 1526 Last data filed at 01/15/2019 0848 Gross per 24 hour  Intake 300 ml  Output -  Net 300 ml    Labs/Imaging Results for orders placed or performed during the hospital encounter of 01/14/19 (from the past 48 hour(s))  Basic metabolic panel     Status: Abnormal   Collection  Time: 01/14/19 10:12 PM  Result Value Ref Range   Sodium 130 (L) 135 - 145 mmol/L   Potassium 4.4 3.5 - 5.1 mmol/L   Chloride 102 98 - 111 mmol/L   CO2 18 (L) 22 - 32 mmol/L   Glucose, Bld 150 (H) 70 - 99 mg/dL   BUN 38 (H) 8 - 23 mg/dL   Creatinine, Ser 1.51 (H) 0.44 - 1.00 mg/dL   Calcium 8.2 (L) 8.9 - 10.3 mg/dL   GFR calc non Af Amer 31 (L) >60 mL/min   GFR calc Af Amer 36 (L) >60 mL/min   Anion gap 10 5 - 15    Comment: Performed at [facility] [address], [place], [place] [address]  CBC     Status: Abnormal   Collection Time:  01/14/19 10:12 PM  Result Value Ref Range   WBC 12.2 (H) 4.0 - 10.5 K/uL   RBC 3.06 (L) 3.87 - 5.11 MIL/uL   Hemoglobin 10.4 (L) 12.0 - 15.0 g/dL   HCT 31.2 (L) 36.0 - 46.0 %   MCV 102.0 (H) 80.0 - 100.0 fL   MCH 34.0 26.0 - 34.0 pg   MCHC 33.3 30.0 - 36.0 g/dL   RDW 13.5 11.5 - 15.5 %   Platelets 263 150 - 400 K/uL   nRBC 0.0 0.0 - 0.2 %    Comment: Performed at [facility], [place] [address], [place], [place] [address]  SARS CORONAVIRUS 2 (TAT 6-24 HRS) Nasopharyngeal Nasopharyngeal Swab     Status: None   Collection Time: 01/15/19  2:52 AM   Specimen: Nasopharyngeal Swab  Result Value Ref Range   SARS Coronavirus 2 NEGATIVE NEGATIVE    Comment: (NOTE) SARS-CoV-2 target nucleic acids are NOT DETECTED. The SARS-CoV-2 RNA is generally detectable in upper and lower respiratory specimens during the acute phase of infection. Negative results do not preclude SARS-CoV-2 infection, do not rule out co-infections with other pathogens, and should not be used as the sole basis for treatment or other patient management decisions. Negative results must be combined with clinical observations, patient history, and epidemiological information. The expected result is Negative. Fact Sheet for Patients: SugarRoll.be Fact Sheet for Healthcare Providers: https://www.woods-mathews.com/ This test is not yet approved or cleared by the [place] FDA and  has been authorized for detection and/or diagnosis of SARS-CoV-2 by FDA under an Emergency Use Authorization (EUA). This EUA will remain  in effect (meaning this test can be used) for the duration of the COVID-19 declaration under Section 56 4(b)(1) of the Act, 21 U.S.C. section 360bbb-3(b)(1), unless the authorization is terminated or revoked sooner. Performed at [facility], [place] [address] [place], [place] [address]   Urinalysis, Routine w reflex microscopic     Status: Abnormal    Collection Time: 01/15/19  4:30 AM  Result Value Ref Range   Color, Urine YELLOW YELLOW   APPearance HAZY (A) CLEAR   Specific Gravity, Urine 1.013 1.005 - 1.030   pH 5.0 5.0 - 8.0   Glucose, UA NEGATIVE NEGATIVE mg/dL   Hgb urine dipstick NEGATIVE NEGATIVE   Bilirubin Urine NEGATIVE NEGATIVE   Ketones, ur NEGATIVE NEGATIVE mg/dL   Protein, ur 30 (A) NEGATIVE mg/dL   Nitrite NEGATIVE NEGATIVE   Leukocytes,Ua SMALL (A) NEGATIVE   RBC / HPF 0-5 0 - 5 RBC/hpf   WBC, UA >50 (H) 0 - 5 WBC/hpf   Bacteria, UA RARE (A) NONE SEEN   Squamous Epithelial / LPF  0-5 0 - 5   Mucus PRESENT     Comment: Performed at [facility], [place] [address] [place], [place] [address]  Lactic acid, plasma     Status: None   Collection Time: 01/15/19  4:39 AM  Result Value Ref Range   Lactic Acid, Venous 0.9 0.5 - 1.9 mmol/L    Comment: Performed at [facility] [address] [place], [place] [address]  Blood Culture (routine x 2)     Status: None (Preliminary result)   Collection Time: 01/15/19  5:05 AM   Specimen: BLOOD  Result Value Ref Range   Specimen Description BLOOD RIGHT ANTECUBITAL    Special Requests      BOTTLES DRAWN AEROBIC AND ANAEROBIC Blood Culture results may not be optimal due to an inadequate volume of blood received in culture bottles Performed at [facility] [address], [place], [place] [address]    Culture PENDING    Report Status PENDING   Blood Culture (routine x 2)     Status: None (Preliminary result)   Collection Time: 01/15/19  5:30 AM   Specimen: BLOOD RIGHT HAND  Result Value Ref Range   Specimen Description BLOOD RIGHT HAND    Special Requests      BOTTLES DRAWN AEROBIC AND ANAEROBIC Blood Culture results may not be optimal due to an inadequate volume of blood received in culture bottles Performed at [facility], [place] [address], [place], [place] [address]    Culture PENDING    Report Status PENDING   Magnesium     Status: None    Collection Time: 01/15/19  1:00 PM  Result Value Ref Range   Magnesium 1.9 1.7 - 2.4 mg/dL    Comment: Performed at [facility], [place] [address] [place], [place] [address]  Phosphorus     Status: None   Collection Time: 01/15/19  1:00 PM  Result Value Ref Range   Phosphorus 3.1 2.5 - 4.6 mg/dL    Comment: Performed at [facility] [address] [place], [place] [address]  Lactic acid, plasma     Status: Abnormal   Collection Time: 01/15/19  1:07 PM  Result Value Ref Range   Lactic Acid, Venous 2.3 (HH) 0.5 - 1.9 mmol/L    Comment: CRITICAL RESULT CALLED TO, READ BACK BY AND VERIFIED WITH: Danie Binder RN [id number] BY A BENNETT Performed at [facility], [place] [address], [place], [place] [address]   TSH     Status: Abnormal   Collection Time: 01/15/19  1:07 PM  Result Value Ref Range   TSH 4.915 (H) 0.350 - 4.500 uIU/mL    Comment: Performed by a 3rd Generation assay with a functional sensitivity of <=0.01 uIU/mL. Performed at [facility], [place] [address] [place], [place] [address]    Dg Thoracic Spine 2 View  Result Date: 01/15/2019 CLINICAL DATA:  Falls. Thoracic and lumbar back pain. EXAM: THORACIC SPINE 2 VIEWS COMPARISON:  Chest CT reformats 01/01/2019 FINDINGS: Exaggerated thoracic kyphosis. Remote T12 compression fracture with vertebral augmentation. Upper thoracic spine is partially obscured, minor T3 superior endplate compression fracture on recent CT, not well visualized radiographically. Mild diffuse disc space narrowing and endplate spurring. Bones diffusely under mineralized. No acute finding in the glued chest. IMPRESSION: 1. Remote T12 compression fracture with vertebral augmentation. 2. Mild T3 superior endplate compression fracture was seen on recent CT (01/01/2019), and is not well visualized  radiographically, this is age indeterminate. Electronically Signed   By: Keith Rake M.D.   On: 01/15/2019 03:28   Dg Lumbar Spine Complete  Result  Date: 01/15/2019 CLINICAL DATA:  Falls. Lumbar back pain. Prior lumbar surgery. EXAM: LUMBAR SPINE - COMPLETE 4+ VIEW COMPARISON:  Lumbar CT 07/27/2018 FINDINGS: Posterior fusion from L2 through the sacrum. Hardware appears intact. Remote L2 compression fracture, unchanged from prior CT. The remaining vertebral body heights are preserved. No evidence of acute fracture. Diffuse disc space narrowing and endplate spurring. Bones are diffusely under mineralized. The sacroiliac joints are congruent. IMPRESSION: 1. No acute abnormality of the lumbar spine. 2. Postsurgical and degenerative change. 3. Remote L2 compression fracture, unchanged from prior lumbar spine CT. Electronically Signed   By: Keith Rake M.D.   On: 01/15/2019 03:31   Ct Head Wo Contrast  Result Date: 01/15/2019 CLINICAL DATA:  Initial evaluation for acute trauma, fall. EXAM: CT HEAD WITHOUT CONTRAST CT CERVICAL SPINE WITHOUT CONTRAST TECHNIQUE: Multidetector CT imaging of the head and cervical spine was performed following the standard protocol without intravenous contrast. Multiplanar CT image reconstructions of the cervical spine were also generated. COMPARISON:  Comparison made with prior CT from 07/17/2017. FINDINGS: CT HEAD FINDINGS Brain: Moderately advanced age-related cerebral atrophy with chronic small vessel ischemic disease. No acute intracranial hemorrhage. No acute large vessel territory infarct. No mass lesion, mass effect, or midline shift. Diffuse ventricular prominence related to global parenchymal volume loss of hydrocephalus. No extra-axial fluid collection. Vascular: No hyperdense vessel. Scattered vascular calcifications noted within the carotid siphons. Skull: Scalp soft tissues and calvarium within normal limits. Sinuses/Orbits: Globes orbital soft tissues normal. Right sphenoid sinusitis noted. Mastoid air cells are clear. Other: None. CT CERVICAL SPINE FINDINGS Alignment: Exaggeration of the normal cervical lordosis.  No listhesis. Skull base and vertebrae: Visualized skull base intact. Normal C1-2 articulations are preserved in the dens is intact. Mild height loss at the partially visualized T3 vertebral body, grossly similar to previous, likely chronic. Vertebral body height otherwise maintained. No acute fracture. Soft tissues and spinal canal: No acute soft tissue abnormality within the neck. No abnormal prevertebral edema. Spinal canal within normal limits. Vascular calcifications about the carotid bifurcations. Multiple punctate sialoliths noted within the parotid glands bilaterally. Disc levels: Multilevel cervical spondylosis, most notable at C5-6 and C6-7. Upper chest: Visualized upper chest demonstrates no acute finding. Other: None. IMPRESSION: CT BRAIN: 1. No acute intracranial abnormality. 2. Moderately advanced cerebral atrophy with chronic small vessel ischemic disease. CT CERVICAL SPINE: 1. No acute traumatic injury within the cervical spine. 2. Multilevel cervical spondylosis, most notable at C5-6 and C6-7. Electronically Signed   By: Jeannine Boga M.D.   On: 01/15/2019 04:29   Ct Cervical Spine Wo Contrast  Result Date: 01/15/2019 CLINICAL DATA:  Initial evaluation for acute trauma, fall. EXAM: CT HEAD WITHOUT CONTRAST CT CERVICAL SPINE WITHOUT CONTRAST TECHNIQUE: Multidetector CT imaging of the head and cervical spine was performed following the standard protocol without intravenous contrast. Multiplanar CT image reconstructions of the cervical spine were also generated. COMPARISON:  Comparison made with prior CT from 07/17/2017. FINDINGS: CT HEAD FINDINGS Brain: Moderately advanced age-related cerebral atrophy with chronic small vessel ischemic disease. No acute intracranial hemorrhage. No acute large vessel territory infarct. No mass lesion, mass effect, or midline shift. Diffuse ventricular prominence related to global parenchymal volume loss of hydrocephalus. No extra-axial fluid collection.  Vascular: No hyperdense vessel. Scattered vascular calcifications noted within the carotid siphons. Skull: Scalp soft tissues and  calvarium within normal limits. Sinuses/Orbits: Globes orbital soft tissues normal. Right sphenoid sinusitis noted. Mastoid air cells are clear. Other: None. CT CERVICAL SPINE FINDINGS Alignment: Exaggeration of the normal cervical lordosis. No listhesis. Skull base and vertebrae: Visualized skull base intact. Normal C1-2 articulations are preserved in the dens is intact. Mild height loss at the partially visualized T3 vertebral body, grossly similar to previous, likely chronic. Vertebral body height otherwise maintained. No acute fracture. Soft tissues and spinal canal: No acute soft tissue abnormality within the neck. No abnormal prevertebral edema. Spinal canal within normal limits. Vascular calcifications about the carotid bifurcations. Multiple punctate sialoliths noted within the parotid glands bilaterally. Disc levels: Multilevel cervical spondylosis, most notable at C5-6 and C6-7. Upper chest: Visualized upper chest demonstrates no acute finding. Other: None. IMPRESSION: CT BRAIN: 1. No acute intracranial abnormality. 2. Moderately advanced cerebral atrophy with chronic small vessel ischemic disease. CT CERVICAL SPINE: 1. No acute traumatic injury within the cervical spine. 2. Multilevel cervical spondylosis, most notable at C5-6 and C6-7. Electronically Signed   By: Jeannine Boga M.D.   On: 01/15/2019 04:29   Dg Chest Port 1 View  Result Date: 01/15/2019 CLINICAL DATA:  Possible sepsis EXAM: PORTABLE CHEST 1 VIEW COMPARISON:  Thirteen days ago FINDINGS: Low volume chest accentuated by kyphosis. There is a moderate hiatal hernia. ICD/pacer leads into the right ventricle and right atrial appendage. Normal heart size. There is no edema, consolidation, effusion, or pneumothorax. Remote right rib fracture. Remote kyphoplasty. IMPRESSION: No evidence of acute disease,  including pneumonia. Electronically Signed   By: Monte Fantasia M.D.   On: 01/15/2019 05:04   Dg Hip Unilat With Pelvis 2-3 Views Left  Result Date: 01/15/2019 CLINICAL DATA:  Fall with left leg pain EXAM: DG HIP (WITH OR WITHOUT PELVIS) 2-3V LEFT COMPARISON:  Pelvis CT 07/27/2018 FINDINGS: Remote left obturator ring fractures. No evidence of acute fracture. Extensive lumbar spine fusion which is not primarily evaluated. No pelvic ring diastasis. Both hips are located with no evidence of hip fracture. Osteopenia and atherosclerosis is prominent. IMPRESSION: 1. No acute finding. 2. Marked osteopenia.  Remote left obturator ring fractures. Electronically Signed   By: Monte Fantasia M.D.   On: 01/15/2019 06:55   Dg Femur Port Min 2 Views Left  Result Date: 01/15/2019 CLINICAL DATA:  Fall with left leg pain EXAM: LEFT FEMUR PORTABLE 2 VIEWS COMPARISON:  None. FINDINGS: Marked osteopenia. No acute fracture or dislocation. Remote left obturator ring fractures that have healed. Extensive atherosclerotic calcification. No acute soft tissue finding. IMPRESSION: 1. No acute finding. 2. Prominent osteopenia. Electronically Signed   By: Monte Fantasia M.D.   On: 01/15/2019 06:56    Pending Labs Unresulted Labs (From admission, onward)    Start     Ordered   01/16/19 [id number]  Basic metabolic panel  Tomorrow morning,   R     01/15/19 0631   01/16/19 0500  CBC  Tomorrow morning,   R     01/15/19 0631   01/15/19 0204  Urine culture  ONCE - STAT,   STAT     01/15/19 0203          Vitals/Pain Today's Vitals   01/15/19 0745 01/15/19 0800 01/15/19 0851 01/15/19 1220  BP: (!) 118/52 (!) 121/44 (!) 116/25 (!) 99/53  Pulse: (!) 53 70  71  Resp: 16 11 14 16   Temp:      TempSrc:      SpO2: 100% 100%  100%  Isolation Precautions No active isolations  Medications Medications  acetaminophen (TYLENOL) tablet 500-1,000 mg (has no administration in time range)  atorvastatin (LIPITOR) tablet 40 mg (40  mg Oral Given 01/15/19 0926)  verapamil (CALAN-SR) CR tablet 240 mg (has no administration in time range)  sucralfate (CARAFATE) 1 GM/10ML suspension 1 g (has no administration in time range)  ferrous sulfate tablet 325 mg (has no administration in time range)  Rivaroxaban (XARELTO) tablet 15 mg (has no administration in time range)  cholecalciferol (VITAMIN D3) tablet 500 Units (500 Units Oral Given 01/15/19 0927)  polyvinyl alcohol (LIQUIFILM TEARS) 1.4 % ophthalmic solution 1 drop (1 drop Both Eyes Not Given 01/15/19 0918)  pantoprazole (PROTONIX) EC tablet 20 mg (has no administration in time range)  cefTRIAXone (ROCEPHIN) 1 g in sodium chloride 0.9 % 100 mL IVPB (1 g Intravenous New Bag/Given 01/15/19 1443)  sodium chloride flush (NS) 0.9 % injection 3 mL (3 mLs Intravenous Given 01/15/19 0406)  sodium chloride 0.9 % bolus 500 mL (0 mLs Intravenous Stopped 01/15/19 0435)  sodium chloride 0.9 % bolus 1,000 mL (0 mLs Intravenous Stopped 01/15/19 0547)  sodium chloride 0.9 % bolus 500 mL (0 mLs Intravenous Stopped 01/15/19 0547)  aztreonam (AZACTAM) 2 g in sodium chloride 0.9 % 100 mL IVPB (0 g Intravenous Stopped 01/15/19 [id number])  vancomycin (VANCOCIN) IVPB 1000 mg/200 mL premix (0 mg Intravenous Stopped 01/15/19 0848)  sodium chloride 0.9 % bolus 1,000 mL (1,000 mLs Intravenous New Bag/Given 01/15/19 1424)    Mobility walks with person assist High fall risk   Focused Assessments GI/GU: UTI   R Recommendations: See Admitting Provider Note  Report given to:   Additional Notes:

## 2019-01-15 NOTE — ED Notes (Signed)
5W nurse unable to take report, will call back.

## 2019-01-15 NOTE — Progress Notes (Signed)
Pharmacy Antibiotic Note  Brenda [name] is a 85 y.o. female admitted on 01/14/2019 with sepsis.  Pharmacy has been consulted for vancomycin and aztreonam dosing. Aztreonam 2gm ordered in ED  Plan: Vancomycin 1gm IV q48 hours Will f/u change aztreonam to cefepime F/u renal function, cultures and clinical course    Temp (24hrs), Avg:97.3 F (36.3 C), Min:97 F (36.1 C), Max:97.6 F (36.4 C)  Recent Labs  Lab 01/14/19 2212  WBC 12.2*  CREATININE 1.51*    Estimated Creatinine Clearance: 22 mL/min (A) (by C-G formula based on SCr of 1.51 mg/dL (H)).    Allergies  Allergen Reactions  . Sotalol Other (See Comments)    Seizures   . Erythromycin Nausea Only  . Augmentin [Amoxicillin-Pot Clavulanate] Rash  . Crestor [Rosuvastatin Calcium] Rash  . Keflex [Cephalexin] Rash  . Septra [Sulfamethoxazole-Trimethoprim] Rash    Thank you for allowing pharmacy to be a part of this patient's care.  Excell Seltzer Poteet 01/15/2019 4:59 AM

## 2019-01-15 NOTE — Discharge Summary (Addendum)
[facility] Discharge Summary  Patient name: Brenda [name] Medical record number: [id number] Date of birth: 1931-12-20 Age: 85 y.o. Gender: female Date of Admission: 01/14/2019  Date of Discharge: 01/16/2019 Admitting Physician: Leeanne Rio, MD  Primary Care Provider: Benay Pike, MD Consultants: None  Indication for Hospitalization: UTI meeting SIRS criteria  Discharge Diagnoses/Problem List:  UTI  Falls Failure to thrive Osteopenia A.Fib on Xarelto HTN Overactive Bladder  Disposition: Home  Discharge Condition: Stable  Discharge Exam:   General: Alert and oriented in no apparent distress Heart: Regular rate and rhythm with no murmurs appreciated Lungs: CTA bilaterally, no wheezing Abdomen: Bowel sounds present, no abdominal pain, no suprapubic tenderness Skin: Warm and dry  Brief Hospital Course:   Brenda [name] is an 85 year old female presenting with generalized weakness and falls.  In the ED she met SIRS criteria with potential UTI as source.  She was given a dose of vancomycin and aztreonam in the ED. Urine cultures were drawn. Chest x-ray was negative for pneumonia.  Patient was narrowed to ceftriaxone for possible UTI, however on day 2 of hospitalization her urine cultures resulted with no growth.  Patient's ceftriaxone was discontinued.  Anemia: On day of discharge patient's hemoglobin was noted to decrease from 10.4-7.9.  Patient did have a history of GI bleed, however she had also received approximately 3 L of fluids when having her work-up.  Repeat CBC was checked with a hemoglobin of 8.5 later on the day of discharge, strongly suggestive the anemia was due to dilutional effect.  History of falls Per patient's daughter patient has had 2 falls over the past 2 weeks that appear to be multifactorial in cause.  Of note the patient had been on tolterodine for overactive bladder which is on the beers list.  The patient  was worked up for injury as she had some soreness in her left leg, however left hip x-ray and left femur x-ray were negative for fracture.  The patient's tenderness did improve over her hospitalization.  Issues for Follow Up:  1. Consider changing Detrol to Myrbetriq 2. Follow up as patient found to have osteopenia on hip XR 3. Consider Remeron at outpatient for appetite and depression. 4. Consider repeat CBC to continue to monitor anemia to ensure this was indeed delusional  Significant Procedures: None  Significant Labs and Imaging:  Recent Labs  Lab 01/14/19 2212 01/16/19 0137 01/16/19 1358  WBC 12.2* 8.4 10.2  HGB 10.4* 7.9* 8.5*  HCT 31.2* 24.5* 26.5*  PLT 263 208 223   Recent Labs  Lab 01/14/19 2212 01/15/19 1300 01/16/19 0137  NA 130*  --  136  K 4.4  --  4.0  CL 102  --  110  CO2 18*  --  18*  GLUCOSE 150*  --  103*  BUN 38*  --  27*  CREATININE 1.51*  --  1.18*  CALCIUM 8.2*  --  7.5*  MG  --  1.9  --   PHOS  --  3.1  --     Results/Tests Pending at Time of Discharge: None  Discharge Medications:  Allergies as of 01/16/2019      Reactions   Sotalol Other (See Comments)   Seizures   Erythromycin Nausea Only   Augmentin [amoxicillin-pot Clavulanate] Rash   Crestor [rosuvastatin Calcium] Rash   Keflex [cephalexin] Rash   Happened >10 yrs ago, no SOB   Septra [sulfamethoxazole-trimethoprim] Rash      Medication List  TAKE these medications   acetaminophen 500 MG tablet Commonly known as: TYLENOL Take 500-1,000 mg by mouth every 6 (six) hours as needed for mild pain.   atorvastatin 40 MG tablet Commonly known as: LIPITOR TAKE 1 TABLET(40 MG) BY MOUTH DAILY What changed: See the new instructions.   ferrous sulfate 325 (65 FE) MG tablet Take 325 mg by mouth every Monday, Wednesday, and Friday.   furosemide 20 MG tablet Commonly known as: LASIX Take 1 tablet (20 mg total) by mouth daily as needed. What changed: reasons to take this    metoprolol tartrate 100 MG tablet Commonly known as: LOPRESSOR Take 1 tablet (100 mg total) by mouth 2 (two) times daily. What changed: Another medication with the same name was removed. Continue taking this medication, and follow the directions you see here.   omeprazole 40 MG capsule Commonly known as: PRILOSEC Take 1 capsule (40 mg total) by mouth 2 (two) times daily. Take before breakfast and dinner.   sucralfate 1 GM/10ML suspension Commonly known as: Carafate TAKE 10 ML (1 GRAM TOTAL) EVERY 6 HOURS AS NEEDED What changed:   how much to take  how to take this  when to take this  reasons to take this  additional instructions   Systane Complete 0.6 % Soln Generic drug: Propylene Glycol Place 1 drop into both eyes daily.   tolterodine 2 MG 24 hr capsule Commonly known as: DETROL LA TAKE 1 CAPSULE(2 MG) BY MOUTH DAILY What changed:   how much to take  how to take this  when to take this  additional instructions   verapamil 240 MG CR tablet Commonly known as: CALAN-SR Take 1 tablet (240 mg total) by mouth at bedtime.   VITAMIN D PO Take 1 capsule by mouth daily.   Xarelto 15 MG Tabs tablet Generic drug: Rivaroxaban TAKE 1 TABLET DAILY WITH SUPPER What changed: See the new instructions.       Discharge Instructions: Please refer to Patient Instructions section of EMR for full details.  Patient was counseled important signs and symptoms that should prompt return to medical care, changes in medications, dietary instructions, activity restrictions, and follow up appointments.   Follow-Up Appointments: Follow-up [facility] Follow up on 01/23/2019.   Why: @ 1:50pm Contact information: [place], [place], DO 01/16/2019, 4:40 PM PGY-1, [facility] Upper-Level Resident Addendum   I have independently interviewed and examined  the patient. I have discussed the above with the original author and agree with their documentation.    Matilde Haymaker MD PGY-2, [facility] 01/16/2019 9:39 PM  [facility] Service pager: [phone] (text pages welcome through [facility])

## 2019-01-15 NOTE — Progress Notes (Signed)
Brenda [name] GR:[id number] Admission Data: 01/15/2019 5:53 PM Attending Provider: Leeanne Rio, MD  [id number], Garen Lah, MD Consults/ Treatment Team:   Allergies  Allergen Reactions  . Sotalol Other (See Comments)    Seizures   . Erythromycin Nausea Only  . Augmentin [Amoxicillin-Pot Clavulanate] Rash  . Crestor [Rosuvastatin Calcium] Rash  . Keflex [Cephalexin] Rash    Happened >10 yrs ago, no SOB  . Septra [Sulfamethoxazole-Trimethoprim] Rash     Past Medical History:  Diagnosis Date  . A-fib ([place])   . Anemia   . Arthritis   . Basal cell carcinoma    nose  . Diabetes mellitus without complication ([place])    pt denies diabetes  . GERD (gastroesophageal reflux disease)   . Hiatal hernia   . HLD (hyperlipidemia)   . Hypertension   . Seizure ([place]) 2011     Pt orientation to unit, room and routine. Information packet given to patient/family and safety video watched.  Admission INP armband ID verified with patient/family, and in place. SR up x 2, fall risk assessment complete with Patient and family verbalizing understanding of risks associated with falls. Pt verbalizes an understanding of how to use the call bell and to call for help before getting out of bed.  Skin, brusing noted in left hip and lower back from a fall at home stated by pt daughter. Lower bed order for patient.   Will cont to monitor and assist as needed.  Brenda [name] [name], RN 01/15/2019 5:53 PM

## 2019-01-15 NOTE — ED Notes (Signed)
Pt in xray

## 2019-01-15 NOTE — Telephone Encounter (Signed)
Pt admitted to hospital  

## 2019-01-15 NOTE — H&P (Addendum)
[facility] Admission History and Physical Service Pager: [phone]  Patient name: Brenda [name] Medical record number: [id number] Date of birth: 07/03/1931 Age: 85 y.o. Gender: female  Primary Care Provider: Benay Pike, MD Consultants: None Code Status: DNR Preferred Emergency Contact: [name] (daughter)   Chief Complaint: Generalized weakness and fall  Assessment and Plan: Brenda [name] is a 85 y.o. female presenting with  . PMH is significant for A. fib on Xarelto, hypertension, hyperlipidemia  SIRS criteria (RR, WBC) potential septic criteria. Patient presented after being unable to go to urgent care for urine eval after calling clinic c/o "dark urine, abdominal pain, chronic back pain/weakness)".  In the ED patient had 1 recorded respiratory rate of 21.  1 recorded blood pressure of 94/79, rectal temp of 97.  Labs significant for WBC of 12.2.  UA negative for nitrates, ketones, trace leukocytes.  Chest x-ray negative for acute disease.  On exam patient pale looking.  Lung sounds clear to auscultation bilaterally.  Abdominal exam positive for mild tenderness over suprapubic area.  Given history of UTI, questionable UA with trace leukocytes, this is most likely source of early sepsis but unable to rule out other sources given early presentation. S/p 2L (sepsis fluid resus) with appropriate BP response.  Started on vanc and given 1x aztreonam in ED -Admit to med surg, attending Dr. Ardelia Mems -SIRS criteria meets early sepsis. -Pharmacy consult antibiotics -IV vancomycin(10/20-), and IV cefepime(10/20-) -Follow-up blood cultures -Follow-up urine cultures  -Vital signs per unit -Tylenol as needed -Bair hugger as needed  Failure to thrive Appetite is decreased even more over the last 2 weeks.  Daughter states recent weight loss unsure amount.  Per chart review decreased 10 kg over a year.  Daughter reports patient may be depressed since visiting  husband's grave side last Thursday for 1 year anniversary of passing. -Nutrition consult -Encourage p.o. intake -Ferrous sulfate tablets -Consider Remeron 7.5 mg nightly  -consider antidepressant  History of falls Patient had 2 mechanical fall past 2 weeks.  Denies any loss of consciousness or acute pain.  Daughter reports patient having increasing left lower leg weakness.  On exam patient has good strength both lower extremities.  No tenderness to palpation of the left hip point.  No difficulty with passive adduction/abduction.  Has home PT regularly and 24hr care.  XR hip/femur with no acute fractures but does show hx of obturator ring fractures and osteopenia -PT/OT consult -evaluate tolterodine as beers list medication for overactive bladder  Osteopenia: Shown on hip/femur xray -f/u outpatient   A. Fib Currently in sinus rhythm.  Patient takes metoprolol 100 mg twice daily and verapamil 240 mg nightly.  Anticoagulated with Xarelto 15 mg daily.  No complaints of chest pain, palpitations. -Continue current medication  Hypertension Normotensive.  Home medications metoprolol 100 mg twice daily and verapamil 240 mg nightly -Continue home medication.  Hyperlipidemia Chronic.  Home medications Lipitor 40 mg daily. -Continue home medication.  Overactive bladder: taking home tolterodine -hold tolteridine as beers list  FEN/GI: -Cardiac Prophylaxis:  -Xarelto  Disposition: Admit to [place], attending Dr. Ardelia Mems.  History of Present Illness:  Brenda [name] is a 85 y.o. female presenting with generalized weakness.   History obtained from daughter.  Daughter reports patient having weakness last couple weeks as well as 2 mechanical falls.  No loss of consciousness, no complaints of pain.  She has noticed that her left leg has been weaker lately.  Uses walker for ambulation.  Daughter states that patient  has been complaining of back pain and belly pain tenderness over the last  couple of days.  One episode of vomiting on Saturday without hematemesis.  Family trip on Thursday to patient's husband graveside for 1 year anniversary.  Patient did not urinate the entire day.  Daughter states that home PT reported her "urine looked dark" that she call clinic and was advised to seek urgent care for urine test.  Could not arrange transportation throughout the day so decided to bring her to ED after work.  Denies any chest pain, shortness of breath, abdominal pain.  No confusion or altered mental status.  Denies any fever or sick contacts.  No diarrhea, constipation.  No urinary frequency, urgency, dysuria, or hematuria. Reports weight down over the past month patient has not been wanting to eat.  States she does not have any appetite.  Current medications, metoprolol 100 mg twice daily, verapamil 240 XR nightly, Lasix 20 mg as needed, Detrol LA 2 mg daily, Xarelto 15 mg daily.  Patient took all medications today with exception of Lasix.  In the ED she was afebrile, respiration rate 12, oxygen saturation 100% on room air, heart rate 70, BP 120/108.  Labs significant for BUN 38, creatinine 1.51, WBC 12.2.  UA negative for nitrates, ketones, small trace leukocytes, proteinuria.  EKG shows sinus rhythm, PVCs, no STEMI.  Chest x-ray negative for acute disease, including pneumonia.  CT head impressive for no acute intracranial abnormalities.  Moderately advanced cerebral atrophy with chronic small vessel ischemic disease.  CT cervical spine shows no acute traumatic injury with cervical spine.  Multilevel cervical spondylosis, most notable at C5-6 and C6-7.  X-ray lumbar spine shows no acute abnormality.  Postsurgical degenerative change.  Remote L2 compression fracture, unchanged from prior lumbar spine CT.  X-ray thoracic spine shows remote T12 compression fracture with vertebral augmentation.  Mild T3 superior endplate compression fracture seen on recent CT (01/01/2019)  She was given Aztreonam  2 g IV x1.  Sodium chloride 2 L bolus.  Review Of Systems: Per HPI with the following additions:   ROS  Patient Active Problem List   Diagnosis Date Noted   Sepsis ([place]) 01/15/2019   Chronic atrial fibrillation (HCC)    Shortness of breath    Complicated grief    Inanition ([place])    Hypoxia 01/01/2019   Swollen leg 09/27/2018   Black stool 07/19/2018   Constipation 07/16/2018   Melena [id number]   Lichen sclerosus [id number]   Grief reaction 01/19/2018   GERD (gastroesophageal reflux disease) 07/18/2017   Gastrointestinal hemorrhage    Symptomatic anemia 07/17/2017   Iron deficiency anemia 05/09/2017   Osteoporosis 11/24/2016   Skin cancer of nose 11/23/2016   Detrusor instability 11/23/2016   Paroxysmal A-fib ([place]) 08/05/2016   Essential hypertension 05/05/2016   Type 2 diabetes mellitus without complication, without long-term current use of insulin ([place]) 05/05/2016   Hiatal hernia 05/05/2016   History of CVA (cerebrovascular accident) without residual deficits 05/05/2016   Memory change 05/05/2016   AICD (automatic cardioverter/defibrillator) present 05/05/2016   Back pain 05/05/2016    Past Medical History: Past Medical History:  Diagnosis Date   A-fib ([place])    Anemia    Arthritis    Basal cell carcinoma    nose   Diabetes mellitus without complication ([place])    pt denies diabetes   GERD (gastroesophageal reflux disease)    Hiatal hernia    HLD (hyperlipidemia)    Hypertension    Seizure ([place])  2011    Past Surgical History: Past Surgical History:  Procedure Laterality Date   CARDIAC DEFIBRILLATOR PLACEMENT     CARDIAC DEFIBRILLATOR PLACEMENT     CATARACT EXTRACTION Left 02/2001   CHOLECYSTECTOMY     COLONOSCOPY WITH PROPOFOL N/A 07/19/2017   Procedure: COLONOSCOPY WITH PROPOFOL;  Surgeon: Jerene Bears, MD;  Location: [facility];  Service: Gastroenterology;  Laterality: N/A;   ESOPHAGOGASTRODUODENOSCOPY (EGD) WITH PROPOFOL N/A 07/19/2017   Procedure:  ESOPHAGOGASTRODUODENOSCOPY (EGD) WITH PROPOFOL;  Surgeon: Jerene Bears, MD;  Location: [facility] ENDOSCOPY;  Service: Gastroenterology;  Laterality: N/A;   LUMBAR LAMINECTOMY  06/11/2001   LUMBAR LAMINECTOMY  02/09/2006   LUMBAR LAMINECTOMY  07/07/2006   with spianal cord stimulator   SPINAL CORD STIMULATOR REMOVAL  14/2009   TUBAL LIGATION      Social History: Social History   Tobacco Use   Smoking status: Never Smoker   Smokeless tobacco: Never Used  Substance Use Topics   Alcohol use: No   Drug use: No   Additional social history:   Please also refer to relevant sections of EMR.  Family History: Family History  Problem Relation Age of Onset   Diabetes Mother    Heart failure Father    Breast cancer Sister    Heart failure Sister    Diabetes Son    Diabetes Sister    Colon cancer Neg Hx    Stomach cancer Neg Hx    Rectal cancer Neg Hx    Esophageal cancer Neg Hx    (If not completed, MUST add something in)  Allergies and Medications: Allergies  Allergen Reactions   Sotalol Other (See Comments)    Seizures    Erythromycin Nausea Only   Augmentin [Amoxicillin-Pot Clavulanate] Rash   Crestor [Rosuvastatin Calcium] Rash   Keflex [Cephalexin] Rash   Septra [Sulfamethoxazole-Trimethoprim] Rash   No current facility-administered medications on file prior to encounter.    Current Outpatient Medications on File Prior to Encounter  Medication Sig Dispense Refill   acetaminophen (TYLENOL) 500 MG tablet Take 500-1,000 mg by mouth every 6 (six) hours as needed for mild pain.     atorvastatin (LIPITOR) 40 MG tablet TAKE 1 TABLET(40 MG) BY MOUTH DAILY (Patient taking differently: Take 40 mg by mouth daily. ) 90 tablet 3   ferrous sulfate 325 (65 FE) MG tablet Take 325 mg by mouth every Monday, Wednesday, and Friday.      furosemide (LASIX) 20 MG tablet Take 1 tablet (20 mg total) by mouth daily as needed. (Patient taking differently: Take 20 mg by mouth daily as needed for  fluid. ) 30 tablet 9   metoprolol tartrate (LOPRESSOR) 100 MG tablet Take 1 tablet (100 mg total) by mouth 2 (two) times daily. 60 tablet 0   omeprazole (PRILOSEC) 40 MG capsule Take 1 capsule (40 mg total) by mouth 2 (two) times daily. Take before breakfast and dinner. 180 capsule 3   Propylene Glycol (SYSTANE COMPLETE) 0.6 % SOLN Place 1 drop into both eyes daily.     sucralfate (CARAFATE) 1 GM/10ML suspension TAKE 10 ML (1 GRAM TOTAL) EVERY 6 HOURS AS NEEDED (Patient taking differently: Take 1 g by mouth 4 (four) times daily as needed (for stomach). ) 2700 mL 0   tolterodine (DETROL LA) 2 MG 24 hr capsule TAKE 1 CAPSULE(2 MG) BY MOUTH DAILY (Patient taking differently: Take 2 mg by mouth daily. ) 90 capsule 1   verapamil (CALAN-SR) 240 MG CR tablet Take 1 tablet (240  mg total) by mouth at bedtime. 90 tablet 2   VITAMIN D PO Take 1 capsule by mouth daily.     XARELTO 15 MG TABS tablet TAKE 1 TABLET DAILY WITH SUPPER (Patient taking differently: Take 15 mg by mouth daily with supper. ) 90 tablet 2   metoprolol tartrate (LOPRESSOR) 100 MG tablet Take 1 tablet (100 mg total) by mouth 2 (two) times daily. (Patient not taking: Reported on 01/15/2019) 180 tablet 3    Objective: BP (!) 124/48   Pulse 68   Temp (!) 97 F (36.1 C) (Rectal)   Resp 14   SpO2 98%  Exam: General: Pale looking 85 year old female in no acute distress ENTM: Mucous membranes moist Neck: Supple, elevated JVD appreciated Cardiovascular: RRR, no murmurs appreciated Respiratory: Chest clear to auscultation bilaterally, no crackles, no rhonchi, no increased work of breathing Gastrointestinal: Soft, nondistended, mild tenderness on palpation of her suprapubic area.  Bowel sounds present MSK: Moves all extremities, right lower extremity 2+ pitting edema, slight tenderness on palpation to left hip area, no difficulty with abduction or abduction of the left hip. Derm: Pale, cool and dry Neuro: Alert orientated x3 Psych:  Pleasant, cooperative and answers questions appropriately.  Labs and Imaging: CBC BMET  Recent Labs  Lab 01/14/19 2212  WBC 12.2*  HGB 10.4*  HCT 31.2*  PLT 263   Recent Labs  Lab 01/14/19 2212  NA 130*  K 4.4  CL 102  CO2 18*  BUN 38*  CREATININE 1.51*  GLUCOSE 150*  CALCIUM 8.2*     EKG:  Sinus rhythm Ventricular premature complex Borderline prolonged PR interval Abnormal R-wave progression, early transition Left ventricular hypertrophy No significant change since last tracing Confirmed by Pryor Curia [phone]) on 01/15/2019 2:32:33 AM  Dg Thoracic Spine 2 View  Result Date: 01/15/2019 CLINICAL DATA:  Falls. Thoracic and lumbar back pain. EXAM: THORACIC SPINE 2 VIEWS COMPARISON:  Chest CT reformats 01/01/2019 FINDINGS: Exaggerated thoracic kyphosis. Remote T12 compression fracture with vertebral augmentation. Upper thoracic spine is partially obscured, minor T3 superior endplate compression fracture on recent CT, not well visualized radiographically. Mild diffuse disc space narrowing and endplate spurring. Bones diffusely under mineralized. No acute finding in the glued chest. IMPRESSION: 1. Remote T12 compression fracture with vertebral augmentation. 2. Mild T3 superior endplate compression fracture was seen on recent CT (01/01/2019), and is not well visualized radiographically, this is age indeterminate. Electronically Signed   By: Keith Rake M.D.   On: 01/15/2019 03:28   Dg Lumbar Spine Complete  Result Date: 01/15/2019 CLINICAL DATA:  Falls. Lumbar back pain. Prior lumbar surgery. EXAM: LUMBAR SPINE - COMPLETE 4+ VIEW COMPARISON:  Lumbar CT 07/27/2018 FINDINGS: Posterior fusion from L2 through the sacrum. Hardware appears intact. Remote L2 compression fracture, unchanged from prior CT. The remaining vertebral body heights are preserved. No evidence of acute fracture. Diffuse disc space narrowing and endplate spurring. Bones are diffusely under mineralized. The  sacroiliac joints are congruent. IMPRESSION: 1. No acute abnormality of the lumbar spine. 2. Postsurgical and degenerative change. 3. Remote L2 compression fracture, unchanged from prior lumbar spine CT. Electronically Signed   By: Keith Rake M.D.   On: 01/15/2019 03:31   Ct Head Wo Contrast  Result Date: 01/15/2019 CLINICAL DATA:  Initial evaluation for acute trauma, fall. EXAM: CT HEAD WITHOUT CONTRAST CT CERVICAL SPINE WITHOUT CONTRAST TECHNIQUE: Multidetector CT imaging of the head and cervical spine was performed following the standard protocol without intravenous contrast. Multiplanar CT image reconstructions of  the cervical spine were also generated. COMPARISON:  Comparison made with prior CT from 07/17/2017. FINDINGS: CT HEAD FINDINGS Brain: Moderately advanced age-related cerebral atrophy with chronic small vessel ischemic disease. No acute intracranial hemorrhage. No acute large vessel territory infarct. No mass lesion, mass effect, or midline shift. Diffuse ventricular prominence related to global parenchymal volume loss of hydrocephalus. No extra-axial fluid collection. Vascular: No hyperdense vessel. Scattered vascular calcifications noted within the carotid siphons. Skull: Scalp soft tissues and calvarium within normal limits. Sinuses/Orbits: Globes orbital soft tissues normal. Right sphenoid sinusitis noted. Mastoid air cells are clear. Other: None. CT CERVICAL SPINE FINDINGS Alignment: Exaggeration of the normal cervical lordosis. No listhesis. Skull base and vertebrae: Visualized skull base intact. Normal C1-2 articulations are preserved in the dens is intact. Mild height loss at the partially visualized T3 vertebral body, grossly similar to previous, likely chronic. Vertebral body height otherwise maintained. No acute fracture. Soft tissues and spinal canal: No acute soft tissue abnormality within the neck. No abnormal prevertebral edema. Spinal canal within normal limits. Vascular  calcifications about the carotid bifurcations. Multiple punctate sialoliths noted within the parotid glands bilaterally. Disc levels: Multilevel cervical spondylosis, most notable at C5-6 and C6-7. Upper chest: Visualized upper chest demonstrates no acute finding. Other: None. IMPRESSION: CT BRAIN: 1. No acute intracranial abnormality. 2. Moderately advanced cerebral atrophy with chronic small vessel ischemic disease. CT CERVICAL SPINE: 1. No acute traumatic injury within the cervical spine. 2. Multilevel cervical spondylosis, most notable at C5-6 and C6-7. Electronically Signed   By: Jeannine Boga M.D.   On: 01/15/2019 04:29   Ct Cervical Spine Wo Contrast  Result Date: 01/15/2019 CLINICAL DATA:  Initial evaluation for acute trauma, fall. EXAM: CT HEAD WITHOUT CONTRAST CT CERVICAL SPINE WITHOUT CONTRAST TECHNIQUE: Multidetector CT imaging of the head and cervical spine was performed following the standard protocol without intravenous contrast. Multiplanar CT image reconstructions of the cervical spine were also generated. COMPARISON:  Comparison made with prior CT from 07/17/2017. FINDINGS: CT HEAD FINDINGS Brain: Moderately advanced age-related cerebral atrophy with chronic small vessel ischemic disease. No acute intracranial hemorrhage. No acute large vessel territory infarct. No mass lesion, mass effect, or midline shift. Diffuse ventricular prominence related to global parenchymal volume loss of hydrocephalus. No extra-axial fluid collection. Vascular: No hyperdense vessel. Scattered vascular calcifications noted within the carotid siphons. Skull: Scalp soft tissues and calvarium within normal limits. Sinuses/Orbits: Globes orbital soft tissues normal. Right sphenoid sinusitis noted. Mastoid air cells are clear. Other: None. CT CERVICAL SPINE FINDINGS Alignment: Exaggeration of the normal cervical lordosis. No listhesis. Skull base and vertebrae: Visualized skull base intact. Normal C1-2  articulations are preserved in the dens is intact. Mild height loss at the partially visualized T3 vertebral body, grossly similar to previous, likely chronic. Vertebral body height otherwise maintained. No acute fracture. Soft tissues and spinal canal: No acute soft tissue abnormality within the neck. No abnormal prevertebral edema. Spinal canal within normal limits. Vascular calcifications about the carotid bifurcations. Multiple punctate sialoliths noted within the parotid glands bilaterally. Disc levels: Multilevel cervical spondylosis, most notable at C5-6 and C6-7. Upper chest: Visualized upper chest demonstrates no acute finding. Other: None. IMPRESSION: CT BRAIN: 1. No acute intracranial abnormality. 2. Moderately advanced cerebral atrophy with chronic small vessel ischemic disease. CT CERVICAL SPINE: 1. No acute traumatic injury within the cervical spine. 2. Multilevel cervical spondylosis, most notable at C5-6 and C6-7. Electronically Signed   By: Jeannine Boga M.D.   On: 01/15/2019 04:29  Dg Chest Port 1 View  Result Date: 01/15/2019 CLINICAL DATA:  Possible sepsis EXAM: PORTABLE CHEST 1 VIEW COMPARISON:  Thirteen days ago FINDINGS: Low volume chest accentuated by kyphosis. There is a moderate hiatal hernia. ICD/pacer leads into the right ventricle and right atrial appendage. Normal heart size. There is no edema, consolidation, effusion, or pneumothorax. Remote right rib fracture. Remote kyphoplasty. IMPRESSION: No evidence of acute disease, including pneumonia. Electronically Signed   By: Monte Fantasia M.D.   On: 01/15/2019 05:04   Dg Hip Unilat With Pelvis 2-3 Views Left  Result Date: 01/15/2019 CLINICAL DATA:  Fall with left leg pain EXAM: DG HIP (WITH OR WITHOUT PELVIS) 2-3V LEFT COMPARISON:  Pelvis CT 07/27/2018 FINDINGS: Remote left obturator ring fractures. No evidence of acute fracture. Extensive lumbar spine fusion which is not primarily evaluated. No pelvic ring diastasis.  Both hips are located with no evidence of hip fracture. Osteopenia and atherosclerosis is prominent. IMPRESSION: 1. No acute finding. 2. Marked osteopenia.  Remote left obturator ring fractures. Electronically Signed   By: Monte Fantasia M.D.   On: 01/15/2019 06:55   Dg Femur Port Min 2 Views Left  Result Date: 01/15/2019 CLINICAL DATA:  Fall with left leg pain EXAM: LEFT FEMUR PORTABLE 2 VIEWS COMPARISON:  None. FINDINGS: Marked osteopenia. No acute fracture or dislocation. Remote left obturator ring fractures that have healed. Extensive atherosclerotic calcification. No acute soft tissue finding. IMPRESSION: 1. No acute finding. 2. Prominent osteopenia. Electronically Signed   By: Monte Fantasia M.D.   On: 01/15/2019 06:56    Carollee Leitz, MD 01/15/2019, 7:19 AM PGY-1, [place] Intern pager: [phone], text pages welcome   FPTS Upper-Level Resident Addendum   I have independently interviewed and examined the patient. I have discussed the above with the original author and agree with their documentation. My edits for correction/addition/clarification are in blue. Please see also any attending notes.    Sherene Sires, DO PGY-3, [facility] 01/15/2019 7:56 AM  FPTS Service pager: [phone] (text pages welcome through [facility])

## 2019-01-16 ENCOUNTER — Other Ambulatory Visit: Payer: Self-pay

## 2019-01-16 ENCOUNTER — Encounter (HOSPITAL_COMMUNITY): Payer: Self-pay | Admitting: *Deleted

## 2019-01-16 DIAGNOSIS — R6510 Systemic inflammatory response syndrome (SIRS) of non-infectious origin without acute organ dysfunction: Secondary | ICD-10-CM

## 2019-01-16 LAB — BASIC METABOLIC PANEL
Anion gap: 8 (ref 5–15)
BUN: 27 mg/dL — ABNORMAL HIGH (ref 8–23)
CO2: 18 mmol/L — ABNORMAL LOW (ref 22–32)
Calcium: 7.5 mg/dL — ABNORMAL LOW (ref 8.9–10.3)
Chloride: 110 mmol/L (ref 98–111)
Creatinine, Ser: 1.18 mg/dL — ABNORMAL HIGH (ref 0.44–1.00)
GFR calc Af Amer: 49 mL/min — ABNORMAL LOW (ref >60)
GFR calc non Af Amer: 42 mL/min — ABNORMAL LOW (ref >60)
Glucose, Bld: 103 mg/dL — ABNORMAL HIGH (ref 70–99)
Potassium: 4 mmol/L (ref 3.5–5.1)
Sodium: 136 mmol/L (ref 135–145)

## 2019-01-16 LAB — CBC
HCT: 24.5 % — ABNORMAL LOW (ref 36.0–46.0)
HCT: 26.5 % — ABNORMAL LOW (ref 36.0–46.0)
Hemoglobin: 7.9 g/dL — ABNORMAL LOW (ref 12.0–15.0)
Hemoglobin: 8.5 g/dL — ABNORMAL LOW (ref 12.0–15.0)
MCH: 33.5 pg (ref 26.0–34.0)
MCH: 33.2 pg (ref 26.0–34.0)
MCHC: 32.2 g/dL (ref 30.0–36.0)
MCHC: 32.1 g/dL (ref 30.0–36.0)
MCV: 103.8 fL — ABNORMAL HIGH (ref 80.0–100.0)
MCV: 103.5 fL — ABNORMAL HIGH (ref 80.0–100.0)
Platelets: 208 10*3/uL (ref 150–400)
Platelets: 223 10*3/uL (ref 150–400)
RBC: 2.36 MIL/uL — ABNORMAL LOW (ref 3.87–5.11)
RBC: 2.56 MIL/uL — ABNORMAL LOW (ref 3.87–5.11)
RDW: 13.6 % (ref 11.5–15.5)
RDW: 13.8 % (ref 11.5–15.5)
WBC: 8.4 10*3/uL (ref 4.0–10.5)
WBC: 10.2 10*3/uL (ref 4.0–10.5)
nRBC: 0 % (ref 0.0–0.2)
nRBC: 0 % (ref 0.0–0.2)

## 2019-01-16 LAB — LACTIC ACID, PLASMA
Lactic Acid, Venous: 1.3 mmol/L (ref 0.5–1.9)
Lactic Acid, Venous: 1 mmol/L (ref 0.5–1.9)

## 2019-01-16 LAB — PREPARE RBC (CROSSMATCH)

## 2019-01-16 MED ORDER — ADULT MULTIVITAMIN W/MINERALS CH
1.0000 | ORAL_TABLET | Freq: Every day | ORAL | Status: DC
  Administered 2019-01-17: 1 via ORAL
  Filled 2019-01-16: qty 1

## 2019-01-16 MED ORDER — SODIUM CHLORIDE 0.9% IV SOLUTION: Freq: Once | INTRAVENOUS | Status: DC

## 2019-01-16 MED ORDER — ENSURE ENLIVE PO LIQD
237.0000 mL | Freq: Two times a day (BID) | ORAL | Status: DC
  Administered 2019-01-17 (×2): 237 mL via ORAL

## 2019-01-16 MED ORDER — MIRTAZAPINE 15 MG PO TABS
7.5000 mg | ORAL_TABLET | Freq: Every day | ORAL | Status: DC
  Administered 2019-01-16: 7.5 mg via ORAL
  Filled 2019-01-16: qty 1

## 2019-01-16 NOTE — Progress Notes (Signed)
Initial Nutrition Assessment  DOCUMENTATION CODES:   Not applicable  INTERVENTION:  -Ensure Enlive po BID, each supplement provides 350 kcal and 20 grams of protein Magic cup BID with meals, each supplement provides 290 kcal and 9 grams of protein -MVI daily  NUTRITION DIAGNOSIS:   Inadequate oral intake related to poor appetite, social / environmental circumstances(depression related to complicated grief) as evidenced by energy intake < 75% for > 7 days, per patient/family report.  GOAL:   Patient will meet greater than or equal to 90% of their needs  MONITOR:   PO intake, Supplement acceptance, I & O's, Labs, Weight trends  REASON FOR ASSESSMENT:   Consult Assessment of nutrition requirement/status  ASSESSMENT:  RD working remotely.  85 year old female with past medical history significant of chronic A-fib s/p AICD, GERD, osteoporosis, HTN, T2DM, iron deficiency anemia, and complicated grief who presented to ED with reports of weakness and 2 mechanical falls over the past couple of weeks, endorses weight loss secondary to poor appetite x 1 month. Daughter of patient stated that patient has been complaining of back pain and belly pain with tenderness the past couple of days and reports patient with one episode of no urinary output last week.  Patient admitted with likely UTI; softly meeting SIRS criteria Per chart review, patient's husband passed a year ago and daughter reports patient may be feeling depressed since visiting his grave side last Thursday for the 1 year anniversary. Daughter endorses patient's appetite has decreased even more over the past 2 weeks; unsure of weight loss amount. Currently patient weighs 59.4 kg (130.7 lb) and appears to have been stable over the past 10 months per review of last 10 encounters. Per chart review, patient to start Remeron to aid with mood and appetite. Recommend incorporating daily nutrition supplement, will provide Ensure during  admission to assist with calorie/protien needs.    Patient on regular diet, no recorded meals at this time. Will continue to monitor.   01/03/19 59.7 kg  01/01/19 55.8 kg  11/20/18 56.7 kg  09/28/18 59.5 kg  09/07/18 59.4 kg  07/27/18 60.3 kg  07/19/18 60.3 kg  07/16/18 56.2 kg  03/15/18 61.5 kg   I/Os: +475 ml since admit UOP 300 ml x 24 hr  Medications reviewed and include: Vit D3, Ferrous sulfate, Protonix Labs: reviewed  NUTRITION - FOCUSED PHYSICAL EXAM: Unable to complete at this time; RD working remotely.  Diet Order:   Diet Order            Diet regular Room service appropriate? Yes; Fluid consistency: Thin  Diet effective now              EDUCATION NEEDS:   No education needs have been identified at this time  Skin:  Skin Assessment: Reviewed RN Assessment  Last BM:  10/19  Height:   Ht Readings from Last 1 Encounters:  01/15/19 5' (1.524 m)    Weight: 130.7 lb (+1; BLE per review of RN assessment)  Wt Readings from Last 1 Encounters:  01/16/19 59.4 kg    Ideal Body Weight:  50 kg  BMI:  Body mass index is 25.58 kg/m.  Estimated Nutritional Needs:   Kcal:  1500-1700  Protein:  60-70  Fluid:  >/= 1.3 L/day   Lajuan Lines, RD, LDN Clinical Nutrition Office [phone] After Hours/Weekend Pager: [phone]

## 2019-01-16 NOTE — Evaluation (Signed)
Physical Therapy Evaluation Patient Details Name: Brenda [name] MRN: [id number] DOB: [birth year] Today's Date: 01/16/2019   History of Present Illness  85 yo F presenting with suprapubic pain and generalized weakness, found to have likely UTI. Softly meeting SIRS criteria.  Clinical Impression   Pt admitted with above diagnosis. Noted very recent admission; Managing household distances with RW prior to admission; Presents to PT with decr activity tolerance, generalized weakness;  Pt currently with functional limitations due to the deficits listed below (see PT Problem List). Pt will benefit from skilled PT to increase their independence and safety with mobility to allow discharge to the venue listed below.       Follow Up Recommendations SNF;Home health PT;Supervision/Assistance - 24 hour(SNF vs HHPT with 24/7 assistance for all OOB activity)    Equipment Recommendations  Wheelchair (measurements PT)    Recommendations for Other Services OT consult(as ordered)     Precautions / Restrictions Precautions Precautions: Fall      Mobility  Bed Mobility Overal bed mobility: Needs Assistance Bed Mobility: Supine to Sit     Supine to sit: Min assist     General bed mobility comments: Min handheld assist to pull to sit  Transfers Overall transfer level: Needs assistance Equipment used: Rolling walker (2 wheeled) Transfers: Sit to/from Stand Sit to Stand: Mod assist         General transfer comment: Light mod assist to power up; cues for hand placement  Ambulation/Gait Ambulation/Gait assistance: Mod assist Gait Distance (Feet): (4-5 pivotal steps bed to recliner) Assistive device: Rolling walker (2 wheeled) Gait Pattern/deviations: Shuffle     General Gait Details: Showing decr activity tolerance, turning to sit to recliner prematurely  Stairs            Wheelchair Mobility    Modified Rankin (Stroke Patients Only)       Balance     Sitting  balance-Leahy Scale: Fair       Standing balance-Leahy Scale: Poor                               Pertinent Vitals/Pain Pain Assessment: No/denies pain    Home Living Family/patient expects to be discharged to:: Private residence Living Arrangements: Children Available Help at Discharge: Family;Available PRN/intermittently;Personal care attendant Type of Home: House Home Access: Stairs to enter Entrance Stairs-Rails: Can reach both Entrance Stairs-Number of Steps: 1 Home Layout: Two level;Able to live on main level with bedroom/bathroom Home Equipment: Walker - 4 wheels;Shower seat;Bedside commode;Hand held shower head;Grab bars - tub/shower      Prior Function Level of Independence: Needs assistance   Gait / Transfers Assistance Needed: Pt independently ambulates short household distances with rollator but requries assistance for full household and limited community distances  ADL's / Homemaking Assistance Needed: has assist with showers and sup with dressing  Comments: Above gleaned from chart review of recent admission     Hand Dominance        Extremity/Trunk Assessment   Upper Extremity Assessment Upper Extremity Assessment: Defer to OT evaluation    Lower Extremity Assessment Lower Extremity Assessment: Generalized weakness    Cervical / Trunk Assessment Cervical / Trunk Assessment: Kyphotic  Communication   Communication: HOH  Cognition Arousal/Alertness: Awake/alert Behavior During Therapy: WFL for tasks assessed/performed Overall Cognitive Status: No family/caregiver present to determine baseline cognitive functioning Area of Impairment: Orientation;Awareness  Orientation Level: Disoriented to;Situation                    General Comments      Exercises     Assessment/Plan    PT Assessment Patient needs continued PT services  PT Problem List Decreased strength;Decreased activity tolerance;Decreased  balance;Decreased mobility;Decreased knowledge of use of DME;Decreased safety awareness;Cardiopulmonary status limiting activity       PT Treatment Interventions DME instruction;Gait training;Stair training;Functional mobility training;Therapeutic activities;Therapeutic exercise;Balance training;Neuromuscular re-education;Patient/family education    PT Goals (Current goals can be found in the Care Plan section)  Acute Rehab PT Goals Patient Stated Goal: To go home PT Goal Formulation: With patient Time For Goal Achievement: 01/30/19 Potential to Achieve Goals: Good    Frequency Min 3X/week   Barriers to discharge   Will need to verify 24 hour assist to go home; I favor home for Brenda [name], with familiar environment, caregivers, and routine -- still, if 24 hour rliable assist is not available, must consider SNF    Co-evaluation               AM-PAC PT "6 Clicks" Mobility  Outcome Measure Help needed turning from your back to your side while in a flat bed without using bedrails?: None Help needed moving from lying on your back to sitting on the side of a flat bed without using bedrails?: A Little Help needed moving to and from a bed to a chair (including a wheelchair)?: A Little Help needed standing up from a chair using your arms (e.g., wheelchair or bedside chair)?: A Lot Help needed to walk in hospital room?: A Little Help needed climbing 3-5 steps with a railing? : A Lot 6 Click Score: 17    End of Session Equipment Utilized During Treatment: Gait belt Activity Tolerance: Patient limited by fatigue Patient left: in chair;with call bell/phone within reach;with chair alarm set Nurse Communication: Mobility status;Other (comment)(needs to order breakfast; hers was cold) PT Visit Diagnosis: Other abnormalities of gait and mobility (R26.89)    Time: [id number] PT Time Calculation (min) (ACUTE ONLY): 24 min   Charges:   PT Evaluation $PT Eval Moderate Complexity: 1  Mod PT Treatments $Therapeutic Activity: 8-22 mins        Roney Marion, PT  Acute Rehabilitation Services Pager [phone] Office [phone]   Colletta [place] 01/16/2019, 9:56 AM

## 2019-01-16 NOTE — Progress Notes (Signed)
Family Medicine Teaching Service Daily Progress Note Intern Pager: [phone]  Patient name: Brenda [name] Medical record number: [id number] Date of birth: Aug 17, 1931 Age: 85 y.o. Gender: female  Primary Care Provider: Benay Pike, MD Consultants: None Code Status: DNR  Pt Overview and Major Events to Date:  10/20-admitted 10/21-antibiotics stopped as urine culture negative  Assessment and Plan: Brenda [name] is a 85 y.o. female presenting with "dark urine" and meeting SIRS criteria. PMH is significant for A. fib on Xarelto, hypertension, hyperlipidemia  SIRS criteria (RR, WBC) -resolved Urine cultures negative for growth (collected before antibiotics), antibiotics stopped.  Patient remains afebrile.  Patient status post vancomycin and aztreonam in the emergency department as well as cefepime.  Currently on ceftriaxone.  Initially with WBC of 12.2.  UA was negative for nitrites, ketones and had trace leukocytes.  -IV vancomycin(10/20-), aztreonam (10/20)  -Ceftriaxone (10/20)  Anemia-likely dilutional, though patient has history of GI bleed Hemoglobin dropped from 10.4 to 7.9.  Patient is status post 3 L of IV fluids, she has a transfusion threshold of 8.0 due to history of CVA.  She denies abdominal pains or blood in stools/black stools. -We will recheck CBC this afternoon -Transfusion threshold 7.0 -If CBC drops further will check FOBT  Failure to thrive/depression Appetite is decreased even more over the last 2 weeks.  Daughter reports patient may have lost some weight recently, per chart review it only appears patient has lost a 1-2 kilograms since last year. Daughter reports patient may be depressed since visiting husband's grave side last Thursday for 1 year anniversary of passing. -Nutrition consult -Encourage p.o. intake -Ferrous sulfate tablets -Remeron 7.5 mg nightly for mood and appetite  History of falls Patient had 2 falls past 2 weeks that were  multifactorial in cause.  Denies any loss of consciousness or acute pain.  Daughter reports patient having increasing left lower leg weakness.  On exam patient has good strength both lower extremities.  No tenderness to palpation of the left hip point.  No difficulty with passive adduction/abduction.  Has home PT regularly and 24hr care.  XR hip/femur with no acute fractures but does show hx of obturator ring fractures and osteopenia -PT/OT consult -evaluate tolterodine as beers list medication for overactive bladder  Osteopenia: Shown on hip/femur xray -f/u outpatient   A. Fib Currently in sinus rhythm.  Patient takes metoprolol 100 mg twice daily and verapamil 240 mg nightly.  Anticoagulated with Xarelto 15 mg daily.  No complaints of chest pain, palpitations. -Continue current medication  Hypertension Normotensive.  Home medications metoprolol 100 mg twice daily and verapamil 240 mg nightly -Continue home medication.  Hyperlipidemia Chronic.  Home medications Lipitor 40 mg daily. -Continue home medication.  Overactive bladder:  Taking home tolterodine -hold tolteridine as beers list, consider alternative on discharge.  FEN/GI: Regular diet PPx: Xarelto  Disposition: Pending medical work-up  Subjective:  Patient denies abdominal pains this morning.  She is alert and oriented to person, place, time.  Patient asking for her daughter.  I informed the patient that the daughter was with her yesterday and that I would be of day and the daughter throughout the day.  She denies blood in stool or black stools.  Objective: Temp:  [97.5 F (36.4 C)-98.6 F (37 C)] 98.6 F (37 C) (10/21 0516) Pulse Rate:  [53-83] 71 (10/21 0516) Resp:  [11-18] 16 (10/21 0516) BP: (86-148)/(25-71) 109/44 (10/21 0516) SpO2:  [80 %-100 %] 100 % (10/21 0516) Physical Exam: General: Alert and oriented  in no apparent distress Heart: Regular rate and rhythm with no murmurs appreciated Lungs: CTA  bilaterally, no wheezing Abdomen: Bowel sounds present, no abdominal pain, no suprapubic tenderness Skin: Warm and dry  Laboratory: Recent Labs  Lab 01/14/19 2212 01/16/19 0137  WBC 12.2* 8.4  HGB 10.4* 7.9*  HCT 31.2* 24.5*  PLT 263 208   Recent Labs  Lab 01/14/19 2212 01/16/19 0137  NA 130* 136  K 4.4 4.0  CL 102 110  CO2 18* 18*  BUN 38* 27*  CREATININE 1.51* 1.18*  CALCIUM 8.2* 7.5*  GLUCOSE 150* 103*    Brenda Del, DO 01/16/2019, 6:17 AM PGY-1, [place] Intern pager: [phone], text pages welcome

## 2019-01-16 NOTE — Evaluation (Signed)
Occupational Therapy Evaluation Patient Details Name: Brenda [name] MRN: [id number] DOB: 1931-12-28 Today's Date: 01/16/2019    History of Present Illness 85 yo F presenting with suprapubic pain and generalized weakness, found to have likely UTI. Softly meeting SIRS criteria.   Clinical Impression   Pt with decline in function and safety with ADLs and ADL mobility with impaired strength, balance and endurance. PTA, pt lived at home with her daughter and son and that her daughter works from 11-8 but has caregiver come in to assist pt meals, with bathing, sup for dressing and for overall safety. Pt currently requires min A with UB ADLs, mod A with LB ADLs, mod A with toileting, mod A  sit - stand, mod A  stand - sit as pt attempting to sit far from chair/bed/toilet. Min A to assist with contorl of RW as pt pushes RW too far out from her body (daughter says she does this at home from previous chart/eval). Pt would benefit from acute OT services to address impairments and maximize level of function and safety    Follow Up Recommendations  SNF;Home health OT;Supervision/Assistance - 24 hour (had a recent admission and refused SNF)   Equipment Recommendations  None recommended by OT    Recommendations for Other Services       Precautions / Restrictions Precautions Precautions: Fall Restrictions Weight Bearing Restrictions: No      Mobility Bed Mobility Overal bed mobility: Needs Assistance Bed Mobility: Supine to Sit;Sit to Supine     Supine to sit: Min assist Sit to supine: Min assist      Transfers Overall transfer level: Needs assistance Equipment used: Rolling walker (2 wheeled) Transfers: Sit to/from Stand Sit to Stand: Mod assist         General transfer comment: Light mod assist to power up; cues for hand placement    Balance Overall balance assessment: Needs assistance Sitting-balance support: No upper extremity supported;Feet supported Sitting  balance-Leahy Scale: Fair     Standing balance support: Bilateral upper extremity supported;During functional activity;Single extremity supported Standing balance-Leahy Scale: Poor                             ADL either performed or assessed with clinical judgement   ADL Overall ADL's : Needs assistance/impaired Eating/Feeding: Independent   Grooming: Wash/dry hands;Wash/dry face;Standing;Minimal assistance   Upper Body Bathing: Min guard;Sitting   Lower Body Bathing: Moderate assistance;Sitting/lateral leans;Sit to/from stand   Upper Body Dressing : Min guard;Sitting   Lower Body Dressing: Moderate assistance;Sitting/lateral leans;Sit to/from stand   Toilet Transfer: Moderate assistance;Ambulation;RW;Cueing for sequencing;Cueing for safety;Regular Toilet;Grab bars   Toileting- Clothing Manipulation and Hygiene: Moderate assistance;Sit to/from stand       Functional mobility during ADLs: Moderate assistance;Rolling walker;Cueing for safety;Cueing for sequencing General ADL Comments: pt has assist from caregiver at home     Vision Baseline Vision/History: Wears glasses Wears Glasses: At all times Patient Visual Report: No change from baseline       Perception     Praxis      Pertinent Vitals/Pain Pain Assessment: No/denies pain     Hand Dominance Right   Extremity/Trunk Assessment Upper Extremity Assessment Upper Extremity Assessment: Generalized weakness   Lower Extremity Assessment Lower Extremity Assessment: Defer to PT evaluation   Cervical / Trunk Assessment Cervical / Trunk Assessment: Kyphotic   Communication Communication Communication: HOH   Cognition Arousal/Alertness: Awake/alert Behavior During Therapy: WFL for tasks assessed/performed Overall Cognitive  Status: No family/caregiver present to determine baseline cognitive functioning Area of Impairment: Orientation;Awareness                 Orientation Level: Disoriented  to;Situation                 General Comments       Exercises     Shoulder Instructions      Home Living Family/patient expects to be discharged to:: Private residence Living Arrangements: Children Available Help at Discharge: Family;Available PRN/intermittently;Personal care attendant Type of Home: House Home Access: Stairs to enter [employer] of Steps: 1 Entrance Stairs-Rails: Can reach both Home Layout: Two level;Able to live on main level with bedroom/bathroom   Alternate Level Stairs-Rails: Can reach both Bathroom Shower/Tub: Occupational psychologist: Standard Bathroom Accessibility: Yes   Home Equipment: Environmental consultant - 4 wheels;Shower seat;Bedside commode;Hand held shower head;Grab bars - tub/shower          Prior Functioning/Environment    Gait / Transfers Assistance Needed: Pt independently ambulates short household distances with rollator but requries assistance for full household and limited community distances ADL's / Homemaking Assistance Needed: has assist with showers and sup with dressing            OT Problem List: Decreased strength;Impaired balance (sitting and/or standing);Decreased knowledge of precautions;Decreased activity tolerance;Decreased knowledge of use of DME or AE      OT Treatment/Interventions: Self-care/ADL training;DME and/or AE instruction;Therapeutic activities;Therapeutic exercise;Patient/family education    OT Goals(Current goals can be found in the care plan section) Acute Rehab OT Goals Patient Stated Goal: To go home OT Goal Formulation: With patient Time For Goal Achievement: 01/30/19 Potential to Achieve Goals: Good ADL Goals Pt Will Perform Grooming: with min guard assist;with supervision;with set-up;standing Pt Will Perform Upper Body Bathing: with min guard assist;with supervision;with set-up;sitting Pt Will Perform Lower Body Bathing: with min assist;sitting/lateral leans;sit to/from stand Pt  Will Perform Upper Body Dressing: with min guard assist;with supervision;with set-up;sitting Pt Will Transfer to Toilet: with min assist;ambulating Pt Will Perform Toileting - Clothing Manipulation and hygiene: with min assist;sit to/from stand  OT Frequency: Min 2X/week   Barriers to D/C:            Co-evaluation              AM-PAC OT "6 Clicks" Daily Activity     Outcome Measure Help from another person eating meals?: None Help from another person taking care of personal grooming?: A Little Help from another person toileting, which includes using toliet, bedpan, or urinal?: A Lot Help from another person bathing (including washing, rinsing, drying)?: A Lot Help from another person to put on and taking off regular upper body clothing?: A Little Help from another person to put on and taking off regular lower body clothing?: A Lot 6 Click Score: 16   End of Session Equipment Utilized During Treatment: Gait belt;Rolling walker  Activity Tolerance: Patient limited by fatigue Patient left: in bed;with call bell/phone within reach;with bed alarm set  OT Visit Diagnosis: Unsteadiness on feet (R26.81);Other abnormalities of gait and mobility (R26.89);Muscle weakness (generalized) (M62.81);History of falling (Z91.81)                Time: [id number] OT Time Calculation (min): 26 min Charges:  OT General Charges $OT Visit: 1 Visit OT Evaluation $OT Eval Moderate Complexity: 1 Mod    Britt Bottom 01/16/2019, 3:11 PM

## 2019-01-16 NOTE — Discharge Summary (Signed)
[facility] Discharge Summary  Patient name: Brenda [name] Medical record number: [id number] Date of birth: April 15, 1931 Age: 85 y.o. Gender: female Date of Admission: 01/14/2019  Date of Discharge: 01/17/2019 Admitting Physician: Leeanne Rio, MD  Primary Care Provider: Benay Pike, MD Consultants: None  Indication for Hospitalization: UTI meeting SIRS criteria  Discharge Diagnoses/Problem List:  UTI Falls Failure to thrive Osteopenia A. fib on Xarelto Hypertension Overactive bladder  Disposition: Home  Discharge Condition: Stable  Discharge Exam:  General: Alert and oriented to person, place, time.  No apparent distress Heart: Regular rate and rhythm, murmur present Lungs: CTA bilaterally, no wheezing Abdomen: Bowel sounds present, no abdominal pain Skin: Warm and dry Extremities: Trace to 1+ lower extremity edema  Brief Hospital Course:  Brenda [name] is an 85 year old female presenting with generalized weakness and falls.  In the ED she met SIRS criteria with potential UTI as source.  She was given a dose of vancomycin and aztreonam in the ED. Urine cultures were drawn. Chest x-ray was negative for active disease.  Patient was narrowed to ceftriaxone for possible UTI, however on day 2 of hospitalization her urine cultures resulted with no growth.  Patient's ceftriaxone was discontinued.  Anemia: On day of discharge patient's hemoglobin was noted to decrease from 10.4-7.9.  Patient did have a history of GI bleed, however she had also received approximately 3 L of fluids when having her work-up.  Repeat CBC was checked with a hemoglobin of 8.8 later on the day of discharge, strongly suggestive the anemia was due to dilutional effect.  History of falls Per patient's daughter patient has had 2 falls over the past 2 weeks that appear to be multifactorial in cause.  Of note the patient had been on tolterodine for overactive  bladder which is on the beers list.  The patient was worked up for injury as she had some soreness in her left leg, however left hip x-ray and left femur x-ray were negative for fracture.  The patient's tenderness did improve over her hospitalization.  Issues for Follow Up:  1. Consider changing Detrol to Myrbetriq 2. Follow up as patient found to have osteopenia on hip XR 3. Patient was started on Remeron for appetite and depression. 4. Consider repeat CBC to continue to monitor anemia 5. Follow-up on anemia panel collected on 01/17/2019  Significant Procedures: None  Significant Labs and Imaging:  Recent Labs  Lab 01/14/19 2212 01/16/19 0137 01/16/19 1358  WBC 12.2* 8.4 10.2  HGB 10.4* 7.9* 8.5*  HCT 31.2* 24.5* 26.5*  PLT 263 208 223   Recent Labs  Lab 01/14/19 2212 01/15/19 1300 01/16/19 0137  NA 130*  --  136  K 4.4  --  4.0  CL 102  --  110  CO2 18*  --  18*  GLUCOSE 150*  --  103*  BUN 38*  --  27*  CREATININE 1.51*  --  1.18*  CALCIUM 8.2*  --  7.5*  MG  --  1.9  --   PHOS  --  3.1  --    Dg Thoracic Spine 2 View  Result Date: 01/15/2019 CLINICAL DATA:  Falls. Thoracic and lumbar back pain. EXAM: THORACIC SPINE 2 VIEWS COMPARISON:  Chest CT reformats 01/01/2019 FINDINGS: Exaggerated thoracic kyphosis. Remote T12 compression fracture with vertebral augmentation. Upper thoracic spine is partially obscured, minor T3 superior endplate compression fracture on recent CT, not well visualized radiographically. Mild diffuse disc space narrowing and endplate spurring. Bones  diffusely under mineralized. No acute finding in the glued chest. IMPRESSION: 1. Remote T12 compression fracture with vertebral augmentation. 2. Mild T3 superior endplate compression fracture was seen on recent CT (01/01/2019), and is not well visualized radiographically, this is age indeterminate. Electronically Signed   By: Keith Rake M.D.   On: 01/15/2019 03:28   Dg Lumbar Spine  Complete  Result Date: 01/15/2019 CLINICAL DATA:  Falls. Lumbar back pain. Prior lumbar surgery. EXAM: LUMBAR SPINE - COMPLETE 4+ VIEW COMPARISON:  Lumbar CT 07/27/2018 FINDINGS: Posterior fusion from L2 through the sacrum. Hardware appears intact. Remote L2 compression fracture, unchanged from prior CT. The remaining vertebral body heights are preserved. No evidence of acute fracture. Diffuse disc space narrowing and endplate spurring. Bones are diffusely under mineralized. The sacroiliac joints are congruent. IMPRESSION: 1. No acute abnormality of the lumbar spine. 2. Postsurgical and degenerative change. 3. Remote L2 compression fracture, unchanged from prior lumbar spine CT. Electronically Signed   By: Keith Rake M.D.   On: 01/15/2019 03:31   Ct Head Wo Contrast  Result Date: 01/15/2019 CLINICAL DATA:  Initial evaluation for acute trauma, fall. EXAM: CT HEAD WITHOUT CONTRAST CT CERVICAL SPINE WITHOUT CONTRAST TECHNIQUE: Multidetector CT imaging of the head and cervical spine was performed following the standard protocol without intravenous contrast. Multiplanar CT image reconstructions of the cervical spine were also generated. COMPARISON:  Comparison made with prior CT from 07/17/2017. FINDINGS: CT HEAD FINDINGS Brain: Moderately advanced age-related cerebral atrophy with chronic small vessel ischemic disease. No acute intracranial hemorrhage. No acute large vessel territory infarct. No mass lesion, mass effect, or midline shift. Diffuse ventricular prominence related to global parenchymal volume loss of hydrocephalus. No extra-axial fluid collection. Vascular: No hyperdense vessel. Scattered vascular calcifications noted within the carotid siphons. Skull: Scalp soft tissues and calvarium within normal limits. Sinuses/Orbits: Globes orbital soft tissues normal. Right sphenoid sinusitis noted. Mastoid air cells are clear. Other: None. CT CERVICAL SPINE FINDINGS Alignment: Exaggeration of the normal  cervical lordosis. No listhesis. Skull base and vertebrae: Visualized skull base intact. Normal C1-2 articulations are preserved in the dens is intact. Mild height loss at the partially visualized T3 vertebral body, grossly similar to previous, likely chronic. Vertebral body height otherwise maintained. No acute fracture. Soft tissues and spinal canal: No acute soft tissue abnormality within the neck. No abnormal prevertebral edema. Spinal canal within normal limits. Vascular calcifications about the carotid bifurcations. Multiple punctate sialoliths noted within the parotid glands bilaterally. Disc levels: Multilevel cervical spondylosis, most notable at C5-6 and C6-7. Upper chest: Visualized upper chest demonstrates no acute finding. Other: None. IMPRESSION: CT BRAIN: 1. No acute intracranial abnormality. 2. Moderately advanced cerebral atrophy with chronic small vessel ischemic disease. CT CERVICAL SPINE: 1. No acute traumatic injury within the cervical spine. 2. Multilevel cervical spondylosis, most notable at C5-6 and C6-7. Electronically Signed   By: Jeannine Boga M.D.   On: 01/15/2019 04:29   Ct Cervical Spine Wo Contrast  Result Date: 01/15/2019 CLINICAL DATA:  Initial evaluation for acute trauma, fall. EXAM: CT HEAD WITHOUT CONTRAST CT CERVICAL SPINE WITHOUT CONTRAST TECHNIQUE: Multidetector CT imaging of the head and cervical spine was performed following the standard protocol without intravenous contrast. Multiplanar CT image reconstructions of the cervical spine were also generated. COMPARISON:  Comparison made with prior CT from 07/17/2017. FINDINGS: CT HEAD FINDINGS Brain: Moderately advanced age-related cerebral atrophy with chronic small vessel ischemic disease. No acute intracranial hemorrhage. No acute large vessel territory infarct. No mass lesion, mass  effect, or midline shift. Diffuse ventricular prominence related to global parenchymal volume loss of hydrocephalus. No extra-axial  fluid collection. Vascular: No hyperdense vessel. Scattered vascular calcifications noted within the carotid siphons. Skull: Scalp soft tissues and calvarium within normal limits. Sinuses/Orbits: Globes orbital soft tissues normal. Right sphenoid sinusitis noted. Mastoid air cells are clear. Other: None. CT CERVICAL SPINE FINDINGS Alignment: Exaggeration of the normal cervical lordosis. No listhesis. Skull base and vertebrae: Visualized skull base intact. Normal C1-2 articulations are preserved in the dens is intact. Mild height loss at the partially visualized T3 vertebral body, grossly similar to previous, likely chronic. Vertebral body height otherwise maintained. No acute fracture. Soft tissues and spinal canal: No acute soft tissue abnormality within the neck. No abnormal prevertebral edema. Spinal canal within normal limits. Vascular calcifications about the carotid bifurcations. Multiple punctate sialoliths noted within the parotid glands bilaterally. Disc levels: Multilevel cervical spondylosis, most notable at C5-6 and C6-7. Upper chest: Visualized upper chest demonstrates no acute finding. Other: None. IMPRESSION: CT BRAIN: 1. No acute intracranial abnormality. 2. Moderately advanced cerebral atrophy with chronic small vessel ischemic disease. CT CERVICAL SPINE: 1. No acute traumatic injury within the cervical spine. 2. Multilevel cervical spondylosis, most notable at C5-6 and C6-7. Electronically Signed   By: Jeannine Boga M.D.   On: 01/15/2019 04:29   Dg Chest Port 1 View  Result Date: 01/15/2019 CLINICAL DATA:  Possible sepsis EXAM: PORTABLE CHEST 1 VIEW COMPARISON:  Thirteen days ago FINDINGS: Low volume chest accentuated by kyphosis. There is a moderate hiatal hernia. ICD/pacer leads into the right ventricle and right atrial appendage. Normal heart size. There is no edema, consolidation, effusion, or pneumothorax. Remote right rib fracture. Remote kyphoplasty. IMPRESSION: No evidence of  acute disease, including pneumonia. Electronically Signed   By: Monte Fantasia M.D.   On: 01/15/2019 05:04   Dg Hip Unilat With Pelvis 2-3 Views Left  Result Date: 01/15/2019 CLINICAL DATA:  Fall with left leg pain EXAM: DG HIP (WITH OR WITHOUT PELVIS) 2-3V LEFT COMPARISON:  Pelvis CT 07/27/2018 FINDINGS: Remote left obturator ring fractures. No evidence of acute fracture. Extensive lumbar spine fusion which is not primarily evaluated. No pelvic ring diastasis. Both hips are located with no evidence of hip fracture. Osteopenia and atherosclerosis is prominent. IMPRESSION: 1. No acute finding. 2. Marked osteopenia.  Remote left obturator ring fractures. Electronically Signed   By: Monte Fantasia M.D.   On: 01/15/2019 06:55   Dg Femur Port Min 2 Views Left  Result Date: 01/15/2019 CLINICAL DATA:  Fall with left leg pain EXAM: LEFT FEMUR PORTABLE 2 VIEWS COMPARISON:  None. FINDINGS: Marked osteopenia. No acute fracture or dislocation. Remote left obturator ring fractures that have healed. Extensive atherosclerotic calcification. No acute soft tissue finding. IMPRESSION: 1. No acute finding. 2. Prominent osteopenia. Electronically Signed   By: Monte Fantasia M.D.   On: 01/15/2019 06:56    Results/Tests Pending at Time of Discharge: None  Discharge Medications:  Allergies as of 01/16/2019      Reactions   Sotalol Other (See Comments)   Seizures   Erythromycin Nausea Only   Augmentin [amoxicillin-pot Clavulanate] Rash   Crestor [rosuvastatin Calcium] Rash   Keflex [cephalexin] Rash   Happened >10 yrs ago, no SOB   Septra [sulfamethoxazole-trimethoprim] Rash      Medication List    TAKE these medications   acetaminophen 500 MG tablet Commonly known as: TYLENOL Take 500-1,000 mg by mouth every 6 (six) hours as needed for mild pain.  atorvastatin 40 MG tablet Commonly known as: LIPITOR TAKE 1 TABLET(40 MG) BY MOUTH DAILY What changed: See the new instructions.   ferrous sulfate 325  (65 FE) MG tablet Take 325 mg by mouth every Monday, Wednesday, and Friday.   furosemide 20 MG tablet Commonly known as: LASIX Take 1 tablet (20 mg total) by mouth daily as needed. What changed: reasons to take this   metoprolol tartrate 100 MG tablet Commonly known as: LOPRESSOR Take 1 tablet (100 mg total) by mouth 2 (two) times daily. What changed: Another medication with the same name was removed. Continue taking this medication, and follow the directions you see here.   omeprazole 40 MG capsule Commonly known as: PRILOSEC Take 1 capsule (40 mg total) by mouth 2 (two) times daily. Take before breakfast and dinner.   sucralfate 1 GM/10ML suspension Commonly known as: Carafate TAKE 10 ML (1 GRAM TOTAL) EVERY 6 HOURS AS NEEDED What changed:   how much to take  how to take this  when to take this  reasons to take this  additional instructions   Systane Complete 0.6 % Soln Generic drug: Propylene Glycol Place 1 drop into both eyes daily.   tolterodine 2 MG 24 hr capsule Commonly known as: DETROL LA TAKE 1 CAPSULE(2 MG) BY MOUTH DAILY What changed:   how much to take  how to take this  when to take this  additional instructions   verapamil 240 MG CR tablet Commonly known as: CALAN-SR Take 1 tablet (240 mg total) by mouth at bedtime.   VITAMIN D PO Take 1 capsule by mouth daily.   Xarelto 15 MG Tabs tablet Generic drug: Rivaroxaban TAKE 1 TABLET DAILY WITH SUPPER What changed: See the new instructions.       Discharge Instructions: Please refer to Patient Instructions section of EMR for full details.  Patient was counseled important signs and symptoms that should prompt return to medical care, changes in medications, dietary instructions, activity restrictions, and follow up appointments.   Follow-Up Appointments: Follow-up [facility] Follow up on 01/23/2019.   Why: @ 1:50pm Contact information: [place], [place], DO 01/16/2019, 6:36 PM PGY-1, [facility] Upper-Level Resident Addendum   I have independently interviewed and examined the patient. I have discussed the above with the original author and agree with their documentation. My edits for correction/addition/clarification are above. Please see also any attending notes.    Rory Percy, DO PGY-3, [facility] 01/17/2019 4:00 PM  FPTS Service pager: [phone] (text pages welcome through [facility])

## 2019-01-17 DIAGNOSIS — W19XXXA Unspecified fall, initial encounter: Secondary | ICD-10-CM

## 2019-01-17 DIAGNOSIS — R6510 Systemic inflammatory response syndrome (SIRS) of non-infectious origin without acute organ dysfunction: Secondary | ICD-10-CM

## 2019-01-17 DIAGNOSIS — W19XXXD Unspecified fall, subsequent encounter: Secondary | ICD-10-CM

## 2019-01-17 LAB — CBC
HCT: 23.1 % — ABNORMAL LOW (ref 36.0–46.0)
HCT: 27.9 % — ABNORMAL LOW (ref 36.0–46.0)
Hemoglobin: 7.3 g/dL — ABNORMAL LOW (ref 12.0–15.0)
Hemoglobin: 8.8 g/dL — ABNORMAL LOW (ref 12.0–15.0)
MCH: 32.3 pg (ref 26.0–34.0)
MCH: 32.8 pg (ref 26.0–34.0)
MCHC: 31.6 g/dL (ref 30.0–36.0)
MCHC: 31.5 g/dL (ref 30.0–36.0)
MCV: 102.2 fL — ABNORMAL HIGH (ref 80.0–100.0)
MCV: 104.1 fL — ABNORMAL HIGH (ref 80.0–100.0)
Platelets: 198 10*3/uL (ref 150–400)
Platelets: 245 10*3/uL (ref 150–400)
RBC: 2.26 MIL/uL — ABNORMAL LOW (ref 3.87–5.11)
RBC: 2.68 MIL/uL — ABNORMAL LOW (ref 3.87–5.11)
RDW: 13.7 % (ref 11.5–15.5)
RDW: 13.8 % (ref 11.5–15.5)
WBC: 7.8 10*3/uL (ref 4.0–10.5)
WBC: 10.1 10*3/uL (ref 4.0–10.5)
nRBC: 0 % (ref 0.0–0.2)
nRBC: 0 % (ref 0.0–0.2)

## 2019-01-17 LAB — IRON AND TIBC
Iron: 32 ug/dL (ref 28–170)
Saturation Ratios: 22 % (ref 10.4–31.8)
TIBC: 144 ug/dL — ABNORMAL LOW (ref 250–450)
UIBC: 112 ug/dL

## 2019-01-17 LAB — VITAMIN B12: Vitamin B-12: 418 pg/mL (ref 180–914)

## 2019-01-17 LAB — FERRITIN: Ferritin: 149 ng/mL (ref 11–307)

## 2019-01-17 MED ORDER — MIRTAZAPINE 7.5 MG PO TABS: 7.5000 mg | ORAL_TABLET | Freq: Every day | ORAL | 0 refills | Status: DC

## 2019-01-17 NOTE — Progress Notes (Signed)
Occupational Therapy Treatment Patient Details Name: Brenda [name] MRN: [id number] DOB: October 08, 1931 Today's Date: 01/17/2019    History of present illness 85 yo F presenting with suprapubic pain and generalized weakness, found to have likely UTI. Softly meeting SIRS criteria.   OT comments  Pt making progress with mobility and adls. Pt powering up to stand with min guard today.  Pt requires cues for safety during transfers wanting to sit long before she is lined up in front of the chair.  Pt states she has an aid at home 5x week but has had recent falls.  Focused on safety and fall education.  Pt set up to eat lunch and able to feed self.   Follow Up Recommendations  SNF;Home health OT;Supervision/Assistance - 24 hour    Equipment Recommendations  None recommended by OT    Recommendations for Other Services      Precautions / Restrictions Precautions Precautions: Fall Restrictions Weight Bearing Restrictions: No       Mobility Bed Mobility Overal bed mobility: Needs Assistance Bed Mobility: Supine to Sit     Supine to sit: Min guard;HOB elevated(HOB at 30 degrees)     General bed mobility comments: PT able to get self into full sit without physical assist.  Transfers Overall transfer level: Needs assistance Equipment used: Rolling walker (2 wheeled) Transfers: Sit to/from [facility] Sit to Stand: Min guard Stand pivot transfers: Min assist       General transfer comment: Pt with increased ability to power up into standing today.  Did notice pt does easily get SOB when standing.     Balance Overall balance assessment: Needs assistance Sitting-balance support: No upper extremity supported;Feet supported Sitting balance-Leahy Scale: Good Sitting balance - Comments: sitting balance appears improved today not requiring outside assist but does require supervision.   Standing balance support: Bilateral upper extremity supported;During functional  activity;Single extremity supported Standing balance-Leahy Scale: Poor Standing balance comment: pt requiring minG-minA to maintain standing balance with use of RW                           ADL either performed or assessed with clinical judgement   ADL Overall ADL's : Needs assistance/impaired Eating/Feeding: Independent                   Lower Body Dressing: Minimal assistance;Sit to/from stand;Cueing for compensatory techniques Lower Body Dressing Details (indicate cue type and reason): Pt donned socks and shoes at bed level. Pt transferring sit to stand with min guard. Toilet Transfer: Minimal Production assistant, radio Details (indicate cue type and reason): Pt pivoting to [facility] with min assist prior to lunch.  Pt with urgency.  STates she uses a pad a home.   Toileting- Clothing Manipulation and Hygiene: Minimal assistance;Sit to/from stand Toileting - Clothing Manipulation Details (indicate cue type and reason): min assist for balance in standing.     Functional mobility during ADLs: Moderate assistance;Rolling walker;Cueing for safety;Cueing for sequencing General ADL Comments: PT has aid at home 5x a week.     Vision   Vision Assessment?: No apparent visual deficits Additional Comments: Pt wears glasses.  Stated she could not see her food. Vision evaluated and appears intact.  May have been related more to bright light from the window.   Perception     Praxis      Cognition Arousal/Alertness: Awake/alert Behavior During Therapy: WFL for tasks assessed/performed Overall Cognitive Status: No family/caregiver present to  determine baseline cognitive functioning Area of Impairment: Awareness;Orientation;Safety/judgement                 Orientation Level: Disoriented to;Situation         Awareness: Emergent   General Comments: Pt following all commands and motivated for therapy.  Pt unsure of why she is in the hosptial.  Was  accurate in stating her help situation at home.        Exercises     Shoulder Instructions       General Comments Pt appears to be moving slighly better than yesterday although fatigues easily.    Pertinent Vitals/ Pain       Pain Assessment: No/denies pain  Home Living                                          Prior Functioning/Environment              Frequency  Min 2X/week        Progress Toward Goals  OT Goals(current goals can now be found in the care plan section)  Progress towards OT goals: Progressing toward goals  Acute Rehab OT Goals Patient Stated Goal: To go home OT Goal Formulation: With patient Time For Goal Achievement: 01/30/19 Potential to Achieve Goals: Good ADL Goals Pt Will Perform Grooming: with min guard assist;with supervision;with set-up;standing Pt Will Perform Upper Body Bathing: with min guard assist;with supervision;with set-up;sitting Pt Will Perform Lower Body Bathing: with min assist;sitting/lateral leans;sit to/from stand Pt Will Perform Upper Body Dressing: with min guard assist;with supervision;with set-up;sitting Pt Will Transfer to Toilet: with min assist;ambulating Pt Will Perform Toileting - Clothing Manipulation and hygiene: with min assist;sit to/from stand  Plan Discharge plan remains appropriate    Co-evaluation                 AM-PAC OT "6 Clicks" Daily Activity     Outcome Measure   Help from another person eating meals?: None Help from another person taking care of personal grooming?: A Little Help from another person toileting, which includes using toliet, bedpan, or urinal?: A Little Help from another person bathing (including washing, rinsing, drying)?: A Little Help from another person to put on and taking off regular upper body clothing?: A Little Help from another person to put on and taking off regular lower body clothing?: A Little 6 Click Score: 19    End of Session Equipment  Utilized During Treatment: Rolling walker  OT Visit Diagnosis: Unsteadiness on feet (R26.81);Other abnormalities of gait and mobility (R26.89);Muscle weakness (generalized) (M62.81);History of falling (Z91.81)   Activity Tolerance Patient limited by fatigue   Patient Left in chair;with call bell/phone within reach   Nurse Communication Mobility status        Time: [id number] OT Time Calculation (min): 13 min  Charges: OT General Charges $OT Visit: 1 Visit OT Treatments $Self Care/Home Management : 8-22 mins    Glenford Peers 01/17/2019, 12:24 PM

## 2019-01-17 NOTE — Plan of Care (Signed)
Plan of care needs met. Pt adequate for discharge.

## 2019-01-17 NOTE — Progress Notes (Signed)
01/17/19 1305  PT Visit Information  Last PT Received On 01/17/19  Assistance Needed +1 (+2 for gait progression )  History of Present Illness 85 yo F presenting with suprapubic pain and generalized weakness, found to have likely UTI. Softly meeting SIRS criteria.  Subjective Data  Patient Stated Goal To go home  Precautions  Precautions Fall  Restrictions  Weight Bearing Restrictions No  Pain Assessment  Pain Assessment No/denies pain  Cognition  Arousal/Alertness Awake/alert  Behavior During Therapy WFL for tasks assessed/performed  Overall Cognitive Status No family/caregiver present to determine baseline cognitive functioning  Area of Impairment Awareness;Safety/judgement  Safety/Judgement Decreased awareness of safety  Awareness Emergent  General Comments Pt with decreased awareness of safety and required cues for safety during transfers.   Bed Mobility  Overal bed mobility Needs Assistance  Bed Mobility Sit to Supine  Sit to supine Min assist  General bed mobility comments Min A for LE assist for return to supine.   Transfers  Overall transfer level Needs assistance  Equipment used Rolling walker (2 wheeled)  Transfers Sit to/from [facility]  Sit to Stand Min assist  Stand pivot transfers Min assist;Max assist  General transfer comment Pt requiring min A for steadying assist to stand. When performing transfer, pt initially requiring min A for steadying, however, pt wanting to sit before safely to surface and required max A for transition and controlled descent into sitting on EOB. .   Balance  Overall balance assessment Needs assistance  Sitting-balance support No upper extremity supported;Feet supported  Sitting balance-Leahy Scale Fair  Standing balance support Bilateral upper extremity supported;During functional activity;Single extremity supported  Standing balance-Leahy Scale Poor  Standing balance comment Reliant on UE and external support   PT  - End of Session  Equipment Utilized During Treatment Gait belt  Activity Tolerance Patient limited by fatigue  Patient left in bed;with call bell/phone within reach;with bed alarm set  Nurse Communication Mobility status   PT - Assessment/Plan  PT Plan Current plan remains appropriate  PT Visit Diagnosis Other abnormalities of gait and mobility (R26.89)  PT Frequency (ACUTE ONLY) Min 3X/week  Follow Up Recommendations SNF;Home health PT;Supervision/Assistance - 24 hour (SNF vs HHPT with 24/7 assistance for all OOB activity)  PT equipment Wheelchair (measurements PT)  AM-PAC PT "6 Clicks" Mobility Outcome Measure (Version 2)  Help needed turning from your back to your side while in a flat bed without using bedrails? 4  Help needed moving from lying on your back to sitting on the side of a flat bed without using bedrails? 3  Help needed moving to and from a bed to a chair (including a wheelchair)? 3  Help needed standing up from a chair using your arms (e.g., wheelchair or bedside chair)? 2  Help needed to walk in hospital room? 2  Help needed climbing 3-5 steps with a railing?  1  6 Click Score 15  Consider Recommendation of Discharge To: CIR/SNF/LTACH  PT Goal Progression  Progress towards PT goals Progressing toward goals  Acute Rehab PT Goals  PT Goal Formulation With patient  Time For Goal Achievement 01/30/19  Potential to Achieve Goals Good  PT Time Calculation  PT Start Time (ACUTE ONLY) 1121  PT Stop Time (ACUTE ONLY) 1136  PT Time Calculation (min) (ACUTE ONLY) 15 min  PT General Charges  $$ ACUTE PT VISIT 1 Visit  PT Treatments  $Therapeutic Activity 8-22 mins   Pt progressing towards goals, however, remains limited secondary  to fatigue. Pt on BSC upon arrival. Pt initially requiring min A to stand and initiate transfer, however, pt attempting to sit prematurely and required max A to perform remainder of transfer and for controlled descent into sitting. Current  recommendations appropriate. Will continue to follow acutely to maximize functional mobility independence and safety.   Leighton Ruff, PT, DPT  Acute Rehabilitation Services  Pager: [phone] Office: [phone]

## 2019-01-17 NOTE — Progress Notes (Signed)
Family Medicine Teaching Service Daily Progress Note Intern Pager: [phone]  Patient name: Brenda [name] Medical record number: [id number] Date of birth: 1932-01-05 Age: 85 y.o. Gender: female  Primary Care Provider: Benay Pike, MD Consultants: None Code Status: DNR  Pt Overview and Major Events to Date:  10/20-admitted 10/21-antibiotics stopped as urine culture negative  Assessment and Plan: Brenda [name] is a 85 y.o. female presenting with "dark urine" and meeting SIRS criteria. PMH is significant for A. fib on Xarelto, hypertension, hyperlipidemia  SIRS criteria (RR, WBC) -resolved Patient remains afebrile.  Urine culture negative for growth. -IV vancomycin(10/20-), aztreonam (10/20)  -Ceftriaxone (10/20)  Anemia-patient has history of GI bleed Hemoglobin dropped from 10.4>7.9>8.5>7.3.  She currently denies any abdominal pains or blood in her stool, black stools. -Recheck CBC this afternoon -Can likely work this up outpatient as hemoglobin is only dropping slowly.  Failure to thrive/depression Appetite is decreased even more over the last 2 weeks.  Daughter reports patient may have lost some weight recently, per chart review it only appears patient has lost a 1-2 kilograms since last year. Daughter reports patient may be depressed since visiting husband's grave side last Thursday for 1 year anniversary of passing. -Encourage p.o. intake -Ferrous sulfate tablets -Remeron 7.5 mg nightly for mood and appetite  History of falls Patient had 2 falls past 2 weeks that were multifactorial in cause.  Denies any loss of consciousness or acute pain.  Daughter reports patient having increasing left lower leg weakness.  On exam patient has good strength both lower extremities.  No tenderness to palpation of the left hip point.  No difficulty with passive adduction/abduction.  Has home PT regularly and 24hr care.  XR hip/femur with no acute fractures but does show hx of  obturator ring fractures and osteopenia -PT/OT consult -Evaluate tolterodine as beers list medication for overactive bladder  Osteopenia: Shown on hip/femur xray -f/u outpatient   A. Fib Currently in sinus rhythm.  Patient takes metoprolol 100 mg twice daily and verapamil 240 mg nightly.  Anticoagulated with Xarelto 15 mg daily.  No complaints of chest pain, palpitations. -Continue current medication  Hypertension Normotensive.  Home medications metoprolol 100 mg twice daily and verapamil 240 mg nightly -Continue home medication.  Hyperlipidemia Chronic.  Home medications Lipitor 40 mg daily. -Continue home medication.  Overactive bladder:  Taking home tolterodine -hold tolteridine as beers list, consider alternative on discharge.  FEN/GI: Regular diet PPx: Xarelto  Disposition: Pending medical work-up  Subjective:  Patient this morning is alert and oriented to person, place, time.  She denies any abdominal pains or blood in her stool.  She also denies black stools.    Objective: Temp:  [97.5 F (36.4 C)-98.5 F (36.9 C)] 97.7 F (36.5 C) (10/22 0435) Pulse Rate:  [70-95] 85 (10/22 0435) Resp:  [16-17] 16 (10/22 0435) BP: (113-137)/(47-77) 113/47 (10/22 0435) SpO2:  [99 %-100 %] 100 % (10/22 0435) Weight:  [59.4 kg-62.1 kg] 62.1 kg (10/22 0435)  Physical Exam: General: Alert and oriented to person, place, time.  No apparent distress Heart: Regular rate and rhythm, murmur present Lungs: CTA bilaterally, no wheezing Abdomen: Bowel sounds present, no abdominal pain Skin: Warm and dry Extremities: Trace to 1+ lower extremity edema  Laboratory: Recent Labs  Lab 01/16/19 0137 01/16/19 1358 01/17/19 0308  WBC 8.4 10.2 7.8  HGB 7.9* 8.5* 7.3*  HCT 24.5* 26.5* 23.1*  PLT 208 223 198   Recent Labs  Lab 01/14/19 2212 01/16/19 0137  NA 130*  136  K 4.4 4.0  CL 102 110  CO2 18* 18*  BUN 38* 27*  CREATININE 1.51* 1.18*  CALCIUM 8.2* 7.5*  GLUCOSE 150*  103*    Lurline Del, DO 01/17/2019, 6:16 AM PGY-1, [place] Intern pager: [phone], text pages welcome

## 2019-01-17 NOTE — TOC Initial Note (Signed)
Transition of Care [facility]) - Initial/Assessment Note    Patient Details  Name: Brenda [name] MRN: [id number] Date of Birth: 06-05-1931  Transition of Care [facility]) CM/SW Contact:    Sharin Mons, RN Phone Number: 01/17/2019, 1:45 PM  Clinical Narrative:       Pt readmitted with  generalized weakness and falls. Previously admitted 10/6-10/10/2018, acute hypoxic respiratory failure. At that time pt/daughter declined SNF placement per PT's recommendations. Pt d/c with home health services, [facility] ( PT/OT). Pt also has in home care with [facility], M-F, 11am-8pm while daughter works.  Current PT's evaluation:  SNF;Home health PT;Supervision/Assistance - 24 hour (SNF vs HHPT with 24/7 assistance for all OOB activity.  NCM attempted to discuss d/c planning with pt. However, pt requested I speak with daughter. NCM called pt's daughter and left voice message...awaiting call back. Brenda [name] (Daughter)     [phone]      [facility] team to f/u with disposition needs...  Expected Discharge Plan: [facility] [facility](vs SNF) Barriers to Discharge: No Barriers Identified   Patient Goals and CMS Choice Patient states their goals for this hospitalization and ongoing recovery are:: To go home CMS Medicare.gov Compare Post Acute Care list provided to:: (pt verbalized no need for list. Wanted to continue with [facility]) Choice offered to / list presented to : Patient, Adult Children  Expected Discharge Plan and Services Expected Discharge Plan: [facility](vs SNF)   Discharge Planning Services: CM Consult   Living arrangements for the past 2 months: [place] Expected Discharge Date: 01/16/19               DME Arranged: (owns  W/C)         HH Arranged: PT, OT(Active with [facility]) [place] Agency: [place] ([place]) Date [place]: 01/17/19 Time [place]: 1058    Prior Living Arrangements/Services Living arrangements for  the past 2 months: [place] with:: Adult Children Patient language and need for interpreter reviewed:: Yes Do you feel safe going back to the place where you live?: Yes      Need for Family Participation in Patient Care: Yes (Comment) Care giver support system in place?: Yes (comment) Current home services: Other (comment)([facility] , M-F (11a-8p)) Criminal Activity/Legal Involvement Pertinent to Current Situation/Hospitalization: No - Comment as needed  Activities of Daily Living Home Assistive Devices/Equipment: Dentures (specify type), Eyeglasses, Walker (specify type) ADL Screening (condition at time of admission) Patient's cognitive ability adequate to safely complete daily activities?: No Is the patient deaf or have difficulty hearing?: Yes Does the patient have difficulty seeing, even when wearing glasses/contacts?: No Does the patient have difficulty concentrating, remembering, or making decisions?: Yes Patient able to express need for assistance with ADLs?: Yes Does the patient have difficulty dressing or bathing?: Yes Independently performs ADLs?: No Does the patient have difficulty walking or climbing stairs?: Yes Weakness of Legs: Both Weakness of Arms/Hands: None  Permission Sought/Granted Permission sought to share information with : Case Manager, Family Supports Permission granted to share information with : Yes, Verbal Permission Granted  Share Information with NAME: Freddi Starr (Daughter)[phone]           Emotional Assessment Appearance:: Appears stated age Attitude/Demeanor/Rapport: Engaged Affect (typically observed): Accepting Orientation: : Oriented to Self, Oriented to Place, Oriented to  Time, Oriented to Situation Alcohol / Substance Use: Not Applicable Psych Involvement: No (comment)  Admission diagnosis:  Fall [W19.XXXA] Acute urinary tract  infection [N39.0] Acute sepsis [facility]) [A41.9] Patient Active  Problem List   Diagnosis Date Noted  . SIRS (systemic inflammatory response syndrome) (HCC)   . Chronic atrial fibrillation ([place])   . Shortness of breath   . Complicated grief   . Inanition ([place])   . Hypoxia 01/01/2019  . Swollen leg 09/27/2018  . Black stool 07/19/2018  . Constipation 07/16/2018  . Melena 07/16/2018  . Lichen sclerosus [id number]  . Grief reaction 01/19/2018  . GERD (gastroesophageal reflux disease) 07/18/2017  . Gastrointestinal hemorrhage   . Symptomatic anemia 07/17/2017  . Iron deficiency anemia 05/09/2017  . Osteoporosis 11/24/2016  . Skin cancer of nose 11/23/2016  . 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 Paroxysmal A-fib ([place]) 08/05/2016  . Essential hypertension 05/05/2016  . Type 2 diabetes mellitus without complication, without long-term current use of insulin ([place]) 05/05/2016  . Hiatal hernia 05/05/2016  . History of CVA (cerebrovascular accident) without residual deficits 05/05/2016  . Memory change 05/05/2016  . AICD (automatic cardioverter/defibrillator) present 05/05/2016  . Back pain 05/05/2016   PCP:  Benay Pike, MD Pharmacy:   [facility] [address] [place] [address] Phone: [phone] Fax: [phone]  [facility] [place], [place] [address] [place] [address] Phone: [phone] Fax: [phone]  [facility] [place] [place] - [address] [place] [address] Phone: [phone] Fax: [phone]     Social Determinants of Health (SDOH) Interventions    Readmission Risk Interventions No flowsheet data found.

## 2019-01-17 NOTE — Progress Notes (Signed)
Nsg Discharge Note  Admit Date:  01/14/2019 Discharge date: 01/17/2019   Brenda [name] to be D/C'd Home per MD order.  AVS completed.  Copy for chart, and copy for patient signed, and dated. Patient/caregiver able to verbalize understanding.  Discharge Medication: Allergies as of 01/17/2019      Reactions   Sotalol Other (See Comments)   Seizures   Erythromycin Nausea Only   Augmentin [amoxicillin-pot Clavulanate] Rash   Crestor [rosuvastatin Calcium] Rash   Keflex [cephalexin] Rash   Happened >10 yrs ago, no SOB   Septra [sulfamethoxazole-trimethoprim] Rash      Medication List    TAKE these medications   acetaminophen 500 MG tablet Commonly known as: TYLENOL Take 500-1,000 mg by mouth every 6 (six) hours as needed for mild pain.   atorvastatin 40 MG tablet Commonly known as: LIPITOR TAKE 1 TABLET(40 MG) BY MOUTH DAILY What changed: See the new instructions.   ferrous sulfate 325 (65 FE) MG tablet Take 325 mg by mouth every Monday, Wednesday, and Friday.   furosemide 20 MG tablet Commonly known as: LASIX Take 1 tablet (20 mg total) by mouth daily as needed. What changed: reasons to take this   metoprolol tartrate 100 MG tablet Commonly known as: LOPRESSOR Take 1 tablet (100 mg total) by mouth 2 (two) times daily. What changed: Another medication with the same name was removed. Continue taking this medication, and follow the directions you see here.   mirtazapine 7.5 MG tablet Commonly known as: REMERON Take 1 tablet (7.5 mg total) by mouth at bedtime.   omeprazole 40 MG capsule Commonly known as: PRILOSEC Take 1 capsule (40 mg total) by mouth 2 (two) times daily. Take before breakfast and dinner.   sucralfate 1 GM/10ML suspension Commonly known as: Carafate TAKE 10 ML (1 GRAM TOTAL) EVERY 6 HOURS AS NEEDED What changed:   how much to take  how to take this  when to take this  reasons to take this  additional instructions   Systane Complete  0.6 % Soln Generic drug: Propylene Glycol Place 1 drop into both eyes daily.   tolterodine 2 MG 24 hr capsule Commonly known as: DETROL LA TAKE 1 CAPSULE(2 MG) BY MOUTH DAILY What changed:   how much to take  how to take this  when to take this  additional instructions   verapamil 240 MG CR tablet Commonly known as: CALAN-SR Take 1 tablet (240 mg total) by mouth at bedtime.   VITAMIN D PO Take 1 capsule by mouth daily.   Xarelto 15 MG Tabs tablet Generic drug: Rivaroxaban TAKE 1 TABLET DAILY WITH SUPPER What changed: See the new instructions.       Discharge Assessment: Vitals:   01/17/19 0742 01/17/19 1140  BP: 130/71 113/61  Pulse: 98 89  Resp: 16 17  Temp: 98.2 F (36.8 C) (!) 97.5 F (36.4 C)  SpO2: 100% 100%   Skin clean, dry and intact without evidence of skin break down, no evidence of skin tears noted. IV catheter discontinued intact. Site without signs and symptoms of complications - no redness or edema noted at insertion site, patient denies c/o pain - only slight tenderness at site.  Dressing with slight pressure applied.  D/c Instructions-Education: Discharge instructions given to patient/family with verbalized understanding. D/c education completed with patient/family including follow up instructions, medication list, d/c activities limitations if indicated, with other d/c instructions as indicated by MD - patient able to verbalize understanding, all questions fully answered. Patient instructed  to return to ED, call 911, or call MD for any changes in condition.  Patient escorted via [place], and D/C home via private auto.  [place] N Eagle Pitt[place], RN 01/17/2019 9:08 PM

## 2019-01-18 LAB — FOLATE RBC
Folate, Hemolysate: 440 ng/mL
Folate, RBC: 1577 ng/mL (ref >498)
Hematocrit: 27.9 % — ABNORMAL LOW (ref 34.0–46.6)

## 2019-01-19 LAB — BPAM RBC
Blood Product Expiration Date: 202010272359
Blood Product Expiration Date: 202010272359
ISSUE DATE / TIME: 202010091359
Unit Type and Rh: 9500
Unit Type and Rh: 9500

## 2019-01-19 LAB — TYPE AND SCREEN
ABO/RH(D): O NEG
Antibody Screen: POSITIVE
Unit division: 0
Unit division: 0

## 2019-01-20 LAB — CULTURE, BLOOD (ROUTINE X 2)
Culture: NO GROWTH
Culture: NO GROWTH

## 2019-01-23 ENCOUNTER — Inpatient Hospital Stay: Payer: Medicare Other | Admitting: Family Medicine

## 2019-01-24 ENCOUNTER — Other Ambulatory Visit: Payer: Self-pay

## 2019-01-24 ENCOUNTER — Ambulatory Visit (INDEPENDENT_AMBULATORY_CARE_PROVIDER_SITE_OTHER): Payer: Medicare Other | Admitting: Student in an Organized Health Care Education/Training Program

## 2019-01-24 ENCOUNTER — Encounter: Payer: Self-pay | Admitting: Student in an Organized Health Care Education/Training Program

## 2019-01-24 VITALS — BP 124/88 | HR 71

## 2019-01-24 DIAGNOSIS — R630 Anorexia: Secondary | ICD-10-CM | POA: Diagnosis not present

## 2019-01-24 DIAGNOSIS — M810 Age-related osteoporosis without current pathological fracture: Secondary | ICD-10-CM

## 2019-01-24 DIAGNOSIS — D649 Anemia, unspecified: Secondary | ICD-10-CM

## 2019-01-24 DIAGNOSIS — N3281 Overactive bladder: Secondary | ICD-10-CM | POA: Diagnosis not present

## 2019-01-24 DIAGNOSIS — D500 Iron deficiency anemia secondary to blood loss (chronic): Secondary | ICD-10-CM

## 2019-01-24 MED ORDER — MIRABEGRON ER 25 MG PO TB24: 25.0000 mg | ORAL_TABLET | Freq: Every day | ORAL | 0 refills | Status: DC

## 2019-01-24 NOTE — Assessment & Plan Note (Signed)
Checking CBC today as she was below baseline at discharge and had a fall with prolonged bleeding from leg.

## 2019-01-24 NOTE — Assessment & Plan Note (Signed)
Follow up with PCP

## 2019-01-24 NOTE — Assessment & Plan Note (Signed)
Recommended switching from Detrol to Myrbetriq per discharge summary recommendation. Daughter will change over if insurance covers the cost.

## 2019-01-24 NOTE — Progress Notes (Signed)
   Subjective:    Patient ID: Brenda [name], female    DOB: 02-02-1932, 85 y.o.   MRN: [id number]  CC: Hospital follow-up  HPI:  Symptomatic anemia-patient denies symptoms of anemia today.  She did have a fall 10/23 in which she injured her leg on her bed but both patient and daughter describe this as an accident from her nurse dropping her while transferring and not a syncopal episode and did not have head injury.  The superficial lesion on the leg was difficult to stop the bleeding but did eventually stop with dressing.  Patient is still able to bear weight on the leg and move distal extremity without point tenderness on leg.  Patient is on Xarelto.  Most recent hemoglobin was 8.8.  Remeron-patient has been taking Remeron since discharge and has not noticed an increase in her appetite or improvement in her sleep as she does not have difficulty sleeping.  Urinary frequency-patient has been adherent with Detrol and unaware of beers list warning.  Suggested changing to Myrbetriq and patient's daughter is agreeable to this as long as it is affordable through insurance.    ROS: pertinent noted in the HPI   I have personally reviewed pertinent past medical history, surgical, family, and social history as appropriate.  Objective:  BP 124/88   Pulse 71   SpO2 97%   Vitals and nursing note reviewed  General: NAD, pleasant, able to participate in exam Extremities: Right mid tibial shaft has superficial abrasion with no surrounding erythema and no drainage. Skin: warm and dry, no rashes noted Neuro: alert, no obvious focal deficits Psych: Normal affect and mood  Assessment & Plan:   Symptomatic anemia Checking CBC today as she was below baseline at discharge and had a fall with prolonged bleeding from leg.   Decreased appetite Patient has been adherent with Remeron since hospital admission but was under the impression it was for her sleep and she denies any sleep problems.  Counseled  patient that medication was prescribed for appetite and depression.  Discussed monitoring for any improvement in those areas and if not they can discontinue the medication.  Detrusor instability Recommended switching from Detrol to Myrbetriq per discharge summary recommendation. Daughter will change over if insurance covers the cost.  Osteoporosis Follow-up with PCP  Orders Placed This Encounter  Procedures  . CBC    Meds ordered this encounter  Medications  . mirabegron ER (MYRBETRIQ) 25 MG TB24 tablet    Sig: Take 1 tablet (25 mg total) by mouth daily.    Dispense:  30 tablet    Refill:  0    Doristine Mango, [place] PGY-2

## 2019-01-24 NOTE — Assessment & Plan Note (Signed)
Patient has been adherent with Remeron since hospital admission but was under the impression it was for her sleep and she denies any sleep problems.  Counseled patient that medication was prescribed for appetite and depression.  Discussed monitoring for any improvement in those areas and if not they can discontinue the medication.

## 2019-01-24 NOTE — Patient Instructions (Signed)
It was a pleasure to see you today!  To summarize our discussion for this visit:  Due to the fall with bleeding injury on the leg and low hemoglobin when you left the hospital, I would like to recheck your hemoglobin today.  I will be sending in a different medication for your active bladder, please discontinue taking the Detrol  Continue taking Remeron for sleep and appetite support.  If you do not feel that this is beneficial, you can discontinue.   Call the clinic at [phone] if your symptoms worsen or you have any concerns.   Thank you for allowing me to take part in your care,  Dr. Doristine Mango

## 2019-01-25 LAB — CBC
Hematocrit: 26.5 % — ABNORMAL LOW (ref 34.0–46.6)
Hemoglobin: 8.8 g/dL — ABNORMAL LOW (ref 11.1–15.9)
MCH: 33.3 pg — ABNORMAL HIGH (ref 26.6–33.0)
MCHC: 33.2 g/dL (ref 31.5–35.7)
MCV: 100 fL — ABNORMAL HIGH (ref 79–97)
Platelets: 313 10*3/uL (ref 150–450)
RBC: 2.64 x10E6/uL — CL (ref 3.77–5.28)
RDW: 13 % (ref 11.7–15.4)
WBC: 11.7 10*3/uL — ABNORMAL HIGH (ref 3.4–10.8)

## 2019-01-28 ENCOUNTER — Telehealth: Payer: Self-pay

## 2019-01-28 ENCOUNTER — Other Ambulatory Visit: Payer: Self-pay | Admitting: Cardiology

## 2019-01-28 NOTE — Telephone Encounter (Signed)
Please review below and send in alternative.

## 2019-02-04 ENCOUNTER — Telehealth: Payer: Self-pay

## 2019-02-04 NOTE — Telephone Encounter (Signed)
Magda Paganini, patients home health PT, LVM on nurse line stating she went out to treat the patient today and noticed some bruises around patients face. Magda Paganini stated the patient told her she has had multiple falls over the weekend trying to get to the bathroom. Magda Paganini stated she spoke with patients children and informed them she needed 24 hour supervision. Magda Paganini stated they seemed to dismiss the idea. Magda Paganini just wants to make PCP aware of situation.

## 2019-02-07 ENCOUNTER — Emergency Department (HOSPITAL_COMMUNITY)
Admission: EM | Admit: 2019-02-07 | Discharge: 2019-02-07 | Disposition: A | Discharge status: Home / Self Care | Payer: Medicare Other | Attending: Emergency Medicine | Admitting: Emergency Medicine

## 2019-02-07 ENCOUNTER — Ambulatory Visit (INDEPENDENT_AMBULATORY_CARE_PROVIDER_SITE_OTHER): Payer: Medicare Other | Admitting: Family Medicine

## 2019-02-07 ENCOUNTER — Encounter: Payer: Self-pay | Admitting: Family Medicine

## 2019-02-07 ENCOUNTER — Emergency Department (HOSPITAL_COMMUNITY): Payer: Medicare Other

## 2019-02-07 ENCOUNTER — Ambulatory Visit (INDEPENDENT_AMBULATORY_CARE_PROVIDER_SITE_OTHER): Payer: Medicare Other | Admitting: *Deleted

## 2019-02-07 ENCOUNTER — Encounter (HOSPITAL_COMMUNITY): Payer: Self-pay | Admitting: Emergency Medicine

## 2019-02-07 ENCOUNTER — Other Ambulatory Visit: Payer: Self-pay

## 2019-02-07 VITALS — BP 122/80 | HR 84

## 2019-02-07 DIAGNOSIS — S0990XA Unspecified injury of head, initial encounter: Secondary | ICD-10-CM

## 2019-02-07 DIAGNOSIS — S0083XA Contusion of other part of head, initial encounter: Secondary | ICD-10-CM | POA: Diagnosis present

## 2019-02-07 DIAGNOSIS — I480 Paroxysmal atrial fibrillation: Secondary | ICD-10-CM

## 2019-02-07 DIAGNOSIS — I482 Chronic atrial fibrillation, unspecified: Secondary | ICD-10-CM

## 2019-02-07 DIAGNOSIS — R42 Dizziness and giddiness: Secondary | ICD-10-CM | POA: Insufficient documentation

## 2019-02-07 DIAGNOSIS — W1830XA Fall on same level, unspecified, initial encounter: Secondary | ICD-10-CM | POA: Insufficient documentation

## 2019-02-07 DIAGNOSIS — Z95811 Presence of heart assist device: Secondary | ICD-10-CM | POA: Diagnosis not present

## 2019-02-07 DIAGNOSIS — E119 Type 2 diabetes mellitus without complications: Secondary | ICD-10-CM | POA: Insufficient documentation

## 2019-02-07 DIAGNOSIS — Z7901 Long term (current) use of anticoagulants: Secondary | ICD-10-CM | POA: Diagnosis not present

## 2019-02-07 DIAGNOSIS — I472 Ventricular tachycardia, unspecified: Secondary | ICD-10-CM

## 2019-02-07 DIAGNOSIS — Y939 Activity, unspecified: Secondary | ICD-10-CM | POA: Insufficient documentation

## 2019-02-07 DIAGNOSIS — Z85828 Personal history of other malignant neoplasm of skin: Secondary | ICD-10-CM | POA: Diagnosis not present

## 2019-02-07 DIAGNOSIS — I4891 Unspecified atrial fibrillation: Secondary | ICD-10-CM | POA: Insufficient documentation

## 2019-02-07 DIAGNOSIS — I10 Essential (primary) hypertension: Secondary | ICD-10-CM | POA: Insufficient documentation

## 2019-02-07 DIAGNOSIS — Y929 Unspecified place or not applicable: Secondary | ICD-10-CM | POA: Insufficient documentation

## 2019-02-07 DIAGNOSIS — Y999 Unspecified external cause status: Secondary | ICD-10-CM | POA: Insufficient documentation

## 2019-02-07 DIAGNOSIS — W19XXXA Unspecified fall, initial encounter: Secondary | ICD-10-CM

## 2019-02-07 LAB — CUP PACEART REMOTE DEVICE CHECK
Battery Remaining Longevity: 30 mo
Battery Remaining Percentage: 33 %
Brady Statistic RA Percent Paced: 73 %
Brady Statistic RV Percent Paced: 0 %
Date Time Interrogation Session: 20201112102900
HighPow Impedance: 43 Ohm
Implantable Lead Implant Date: 20111110
Implantable Lead Implant Date: 20111110
Implantable Lead Location: 753859
Implantable Lead Location: 753860
Implantable Lead Model: 157
Implantable Lead Model: 4135
Implantable Lead Serial Number: 28803817
Implantable Lead Serial Number: 302861
Implantable Pulse Generator Implant Date: 20111110
Lead Channel Impedance Value: 420 Ohm
Lead Channel Impedance Value: 709 Ohm
Lead Channel Pacing Threshold Amplitude: 0.3 V
Lead Channel Pacing Threshold Amplitude: 0.8 V
Lead Channel Pacing Threshold Pulse Width: 0.4 ms
Lead Channel Pacing Threshold Pulse Width: 0.4 ms
Lead Channel Setting Pacing Amplitude: 2 V
Lead Channel Setting Pacing Amplitude: 2.4 V
Lead Channel Setting Pacing Pulse Width: 0.4 ms
Lead Channel Setting Sensing Sensitivity: 0.6 mV
Pulse Gen Serial Number: 169112

## 2019-02-07 NOTE — ED Triage Notes (Signed)
Patient presents with family stating patient fell Friday hitting the back of her head. On Saturday patient fell again hitting left side of forehead. Patient has hematoma to posterior head. busing to left side of face. Daughter states patient uses walker and has home health when she is at work. Patient on Xarelto.

## 2019-02-07 NOTE — ED Notes (Signed)
Patient verbalizes understanding of discharge instructions. Opportunity for questioning and answers were provided. Armband removed by staff, pt discharged from ED via wheelchair to home.  

## 2019-02-07 NOTE — Progress Notes (Signed)
  Patient Name: Brenda [name] Date of Birth: 07/09/1931 Date of Visit: 02/07/19 PCP: Benay Pike, MD  Chief Complaint: falls and left sided bruise, bleeding from right hand   Subjective: Brenda [name] is a pleasant 85 y.o. with medical history significant for atrial fibrillation on Xarelto therapy, frequent falls, seizure disorder, and arthritis presenting today for falls at home.  The patient is joined by her daughter.  The patient reports she overall feels okay.  Her daughter reports that she has fallen multiple times last week.  Last 2 days she has had several falls.  1 of which she hit the back of her head and has a large bruise there.  She also had a fall and bruised the left side of her face yesterday.  The patient denies loss of consciousness, several of the falls were unwitnessed.  The patient denies a headache, dizziness, chest pain, difficulty breathing.  She is on Xarelto therapy for her underlying atrial fibrillation.     ROS: Per HPI.   I have reviewed the patient's medical, surgical, family, and social history as appropriate.  Vitals:   02/07/19 1116  BP: 122/80  Pulse: 84  SpO2: 96%   Chronically ill-appearing frail woman in wheelchair HEENT: Ecchymosis is along the left side of the temporal area extending from the supraorbital area down to the zygomatic process.  There is also ecchymotic lesion appears to be a small hematoma on the posterior scalp.  There is an abrasion on the right hand..  Neuro: CN II: PERRL CN III, IV,VI: EOMI CV V: Normal sensation in V1, V2, V3 CVII: Symmetric smile and brow raise CN VIII: Normal hearing CN IX,X: Symmetric palate raise  CN XI: 5/5 shoulder shrug CN XII: Symmetric tongue protrusion  Neck: Supple Cardiac: Regular rate and rhythm. Normal S1/S2. No murmurs, rubs, or gallops appreciated. Lungs: Clear bilaterally to ascultation.  Psych: Tired, ill-appearing reports she is very sad to be going to the ED again.  Fall in  setting of chronic anticoagulation, discussed at length with daughter and patient.  I discussed my recommendation with the patient needed to go to the ED to have head imaging as well as possibly hip imaging as she is recently fallen.  I have significant concerns about the patient's safety at home as she does not have 24-hour supervision at this time.  At her last hospital stay it was recommended that she be discharged to SNF or home with 24-hour supervision.  If the patient is to return home I would strongly consider stopping Xarelto therapy as her risk for falls and subsequent intracranial hemorrhage at this time appears to be greater than her risk for ischemic stroke.  Will defer to PCP the patient may also benefit from chronic care management.  Return to care in after ED visit.   Dorris Singh, MD  Family Medicine Teaching Service

## 2019-02-07 NOTE — ED Notes (Signed)
Family med needs to be paged after imaging is done

## 2019-02-07 NOTE — Patient Instructions (Signed)
Please go to the ER.   I have called ahead.   Dorris Singh, MD  Family Medicine Teaching Service

## 2019-02-07 NOTE — Discharge Instructions (Signed)
You were seen in the ED today after a fall. Your head CT was normal.  Please discuss with your primary care doctor regarding your vertigo symptoms.  You may need to see an ear nose and throat for vestibular rehab.  Your primary care doctor can also assist you with placement issues and paperwork required for skilled nursing facility.

## 2019-02-07 NOTE — ED Provider Notes (Signed)
Emergency Department Provider Note   I have reviewed the triage vital signs and the nursing notes.   HISTORY  Chief Complaint Fall   HPI Brenda [name] is a 85 y.o. female presents to the ED with bruising to the head/face after fall 5 days prior. Patient is anticoagulated with history of a-fib. She lives at home with family and has home health. She uses a walker mostly but is forgetful and wanders at night. Family unsure what caused the initial fall. Patient reports some "dizzy" and "spinning" sensation but only with sitting up. No weakness/numbness. Family became concerned when bruising over the left forehead began to settle around the right eye. No vision changes.   Level 5 caveat: Dementia.    Past Medical History:  Diagnosis Date  . A-fib ([place])   . Anemia   . Arthritis   . Basal cell carcinoma    nose  . Diabetes mellitus without complication ([place])    pt denies diabetes  . GERD (gastroesophageal reflux disease)   . Hiatal hernia   . HLD (hyperlipidemia)   . Hypertension   . Seizure [facility]) 2011    Patient Active Problem List   Diagnosis Date Noted  . Decreased appetite 01/24/2019  . SIRS (systemic inflammatory response syndrome) ([facility])   . Fall   . Chronic atrial fibrillation ([place])   . Shortness of breath   . Complicated grief   . Inanition ([place])   . Hypoxia 01/01/2019  . Swollen leg 09/27/2018  . Black stool 07/19/2018  . Constipation 07/16/2018  . Melena 07/16/2018  . Lichen sclerosus 99991111  . Grief reaction 01/19/2018  . GERD (gastroesophageal reflux disease) 07/18/2017  . Gastrointestinal hemorrhage   . Symptomatic anemia 07/17/2017  . Iron deficiency anemia 05/09/2017  . Osteoporosis 11/24/2016  . Skin cancer of nose 11/23/2016  . 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 Acute urinary tract infection 10/26/2016  . Paroxysmal A-fib ([place]) 08/05/2016  . Essential hypertension 05/05/2016  . Type 2 diabetes mellitus without complication, without  Cheryle Dark-term current use of insulin ([place]) 05/05/2016  . Hiatal hernia 05/05/2016  . History of CVA (cerebrovascular accident) without residual deficits 05/05/2016  . Memory change 05/05/2016  . AICD (automatic cardioverter/defibrillator) present 05/05/2016  . Back pain 05/05/2016    Past Surgical History:  Procedure Laterality Date  . CARDIAC DEFIBRILLATOR PLACEMENT    . CARDIAC DEFIBRILLATOR PLACEMENT    . CATARACT EXTRACTION Left 02/2001  . CHOLECYSTECTOMY    . COLONOSCOPY WITH PROPOFOL N/A 07/19/2017   Procedure: COLONOSCOPY WITH PROPOFOL;  Surgeon: Jerene Bears, MD;  Location: [facility];  Service: Gastroenterology;  Laterality: N/A;  . ESOPHAGOGASTRODUODENOSCOPY (EGD) WITH PROPOFOL N/A 07/19/2017   Procedure: ESOPHAGOGASTRODUODENOSCOPY (EGD) WITH PROPOFOL;  Surgeon: Jerene Bears, MD;  Location: [facility] ENDOSCOPY;  Service: Gastroenterology;  Laterality: N/A;  . LUMBAR LAMINECTOMY  06/11/2001  . LUMBAR LAMINECTOMY  02/09/2006  . LUMBAR LAMINECTOMY  07/07/2006   with spianal cord stimulator  . SPINAL CORD STIMULATOR REMOVAL  14/2009  . TUBAL LIGATION      Allergies Sotalol, Erythromycin, Augmentin [amoxicillin-pot clavulanate], Crestor [rosuvastatin calcium], Keflex [cephalexin], and Septra [sulfamethoxazole-trimethoprim]  Family History  Problem Relation Age of Onset  . Diabetes Mother   . Heart failure Father   . Breast cancer Sister   . Heart failure Sister   . Diabetes Son   . Diabetes Sister   . Colon cancer Neg Hx   . Stomach cancer Neg Hx   . Rectal cancer Neg Hx   .  Esophageal cancer Neg Hx     Social History Social History   Tobacco Use  . Smoking status: Never Smoker  . Smokeless tobacco: Never Used  Substance Use Topics  . Alcohol use: No  . Drug use: No    Review of Systems  Constitutional: No fever/chills Eyes: No visual changes. ENT: No sore throat. Positive spinning sensation with movement.  Cardiovascular: Denies chest pain. Respiratory: Denies  shortness of breath. Gastrointestinal: No abdominal pain.  No nausea, no vomiting.  No diarrhea.  No constipation. Genitourinary: Negative for dysuria. Musculoskeletal: Negative for back pain. Skin: Negative for rash. Positive bruising.  Neurological: Negative for focal weakness or numbness. Positive mild HA.   10-point ROS otherwise negative.  ____________________________________________   PHYSICAL EXAM:  VITAL SIGNS: ED Triage Vitals  Enc Vitals Group     BP 02/07/19 1152 (!) 144/63     Pulse Rate 02/07/19 1152 78     Resp 02/07/19 1152 14     Temp 02/07/19 1152 97.9 F (36.6 C)     Temp Source 02/07/19 1152 Oral     SpO2 02/07/19 1152 100 %   Constitutional: Alert and oriented. Well appearing and in no acute distress. Eyes: Conjunctivae are normal. PERRL. EOMI. Periorbital ecchymosis without edema around the left eye.  Head: Atraumatic. Ears: No hemotympanum.  Nose: No congestion/rhinnorhea. Mouth/Throat: Mucous membranes are moist.  Neck: No stridor. No cervical spine tenderness to palpation. Cardiovascular: Normal rate, regular rhythm. Good peripheral circulation. Grossly normal heart sounds.   Respiratory: Normal respiratory effort.  No retractions. Lungs CTAB. Gastrointestinal: No distention.  Musculoskeletal: No lower extremity tenderness nor edema. No gross deformities of extremities. Neurologic:  Normal speech and language. No gross focal neurologic deficits are appreciated.  Skin:  Skin is warm, dry and intact. No rash noted.  ____________________________________________   LABS (all labs ordered are listed, but only abnormal results are displayed)  Labs Reviewed - No data to display ____________________________________________  EKG   EKG Interpretation  Date/Time:  Thursday February 07 2019 11:59:25 EST Ventricular Rate:  72 PR Interval:  164 QRS Duration: 76 QT Interval:  424 QTC Calculation: 464 R Axis:   25 Text Interpretation: Normal sinus  rhythm Normal ECG No STEMI Confirmed by Nanda Quinton [phone]) on 02/08/2019 8:33:09 AM       ____________________________________________  RADIOLOGY  Ct Head Wo Contrast  Result Date: 02/07/2019 CLINICAL DATA:  85 year old female with head trauma. EXAM: CT HEAD WITHOUT CONTRAST TECHNIQUE: Contiguous axial images were obtained from the base of the skull through the vertex without intravenous contrast. COMPARISON:  Head CT dated 01/15/2019. FINDINGS: Brain: Moderate to advanced age-related atrophy and chronic microvascular ischemic changes. There is no acute intracranial hemorrhage. No mass effect or midline shift. No extra-axial fluid collection. Vascular: No hyperdense vessel or unexpected calcification. Skull: Normal. Negative for fracture or focal lesion. Sinuses/Orbits: There is opacification of the right sphenoid sinus. The remainder of the visualized paranasal sinuses and mastoid air cells are clear. No air-fluid level. Other: None IMPRESSION: 1. No acute intracranial hemorrhage. 2. Age-related atrophy and chronic microvascular ischemic changes. Electronically Signed   By: Anner Crete M.D.   On: 02/07/2019 13:16    ____________________________________________   PROCEDURES  Procedure(s) performed:   Procedures  None ____________________________________________   INITIAL IMPRESSION / ASSESSMENT AND PLAN / ED COURSE  Pertinent labs & imaging results that were available during my care of the patient were reviewed by me and considered in my medical decision making (see chart  for details).   Patient presents to the ED after fall several days ago with some intermittent vertigo symptoms. Normal neuro exam. Nothing on exam to suspect central vertigo cause. CT head without bleeding or fx. EKG interpreted as above. Considered meclizine but with age I am concerned regarding possible side effects of this medication. Will refer to ENT for vestibular rehab. Discussed with daughter at  bedside regarding SNF referral. She feels comfortable with the patient at home now but I encouraged her to begin discussion with PCP regarding SNF and to start paperwork. Daughter in agreement.    ____________________________________________  FINAL CLINICAL IMPRESSION(S) / ED DIAGNOSES  Final diagnoses:  Fall, initial encounter  Injury of head, initial encounter  Vertigo    Note:  This document was prepared using Dragon voice recognition software and may include unintentional dictation errors.  Nanda Quinton, MD, [facility] Emergency Medicine    Zyrell Carmean, [facility], MD 02/08/19 [phone]

## 2019-03-01 NOTE — Progress Notes (Signed)
Remote ICD transmission.   

## 2019-03-07 ENCOUNTER — Ambulatory Visit: Payer: Medicare Other | Admitting: Internal Medicine

## 2019-03-15 ENCOUNTER — Encounter (HOSPITAL_COMMUNITY): Payer: Self-pay | Admitting: Emergency Medicine

## 2019-03-15 ENCOUNTER — Emergency Department (HOSPITAL_COMMUNITY)
Admission: EM | Admit: 2019-03-15 | Discharge: 2019-03-16 | Disposition: A | Discharge status: Home / Self Care | Payer: Medicare Other | Attending: Emergency Medicine | Admitting: Emergency Medicine

## 2019-03-15 ENCOUNTER — Emergency Department (HOSPITAL_COMMUNITY): Payer: Medicare Other

## 2019-03-15 DIAGNOSIS — S0990XA Unspecified injury of head, initial encounter: Secondary | ICD-10-CM | POA: Diagnosis present

## 2019-03-15 DIAGNOSIS — Y999 Unspecified external cause status: Secondary | ICD-10-CM | POA: Diagnosis not present

## 2019-03-15 DIAGNOSIS — Y939 Activity, unspecified: Secondary | ICD-10-CM | POA: Diagnosis not present

## 2019-03-15 DIAGNOSIS — I10 Essential (primary) hypertension: Secondary | ICD-10-CM | POA: Diagnosis not present

## 2019-03-15 DIAGNOSIS — E119 Type 2 diabetes mellitus without complications: Secondary | ICD-10-CM | POA: Diagnosis not present

## 2019-03-15 DIAGNOSIS — Z7901 Long term (current) use of anticoagulants: Secondary | ICD-10-CM | POA: Insufficient documentation

## 2019-03-15 DIAGNOSIS — W010XXA Fall on same level from slipping, tripping and stumbling without subsequent striking against object, initial encounter: Secondary | ICD-10-CM | POA: Insufficient documentation

## 2019-03-15 DIAGNOSIS — Y92002 Bathroom of unspecified non-institutional (private) residence single-family (private) house as the place of occurrence of the external cause: Secondary | ICD-10-CM | POA: Insufficient documentation

## 2019-03-15 DIAGNOSIS — Z79899 Other long term (current) drug therapy: Secondary | ICD-10-CM | POA: Insufficient documentation

## 2019-03-15 DIAGNOSIS — W19XXXA Unspecified fall, initial encounter: Secondary | ICD-10-CM

## 2019-03-15 NOTE — ED Triage Notes (Signed)
Per [facility] pt coming from home where daughter states they were walking to bathroom and assisted patient down to ground. States pt hit her head on the ground and then fell another time while trying to get her into the bed. Patient has dementia and is at baseline per daughter. Patient only c/o wanting C-collar off.

## 2019-03-15 NOTE — ED Notes (Signed)
Removed C-collar with MD Trifans instructions

## 2019-03-15 NOTE — Discharge Instructions (Signed)
Follow up with your primary care doctor.   Return to the Emergency Dept for any vomiting, chest pain, trouble breathing or any other worsening or concerning symptoms.

## 2019-03-15 NOTE — ED Provider Notes (Signed)
[place] EMERGENCY DEPARTMENT Provider Note   CSN: [id number] Arrival date & time: 03/15/19  1413     History Chief Complaint  Patient presents with  . Fall    Brenda [name] is a 85 y.o. female with PMH/o Afib, DM, GERD, HTN brought in by daughter for evaluation of fall that occurred just 15 minutes prior to ED arrival.  Daughter reports that she witnessed the fall.  She was helping patient move from the bathroom.  She states normally she grabs a hold of her and helps her.  She reports that she thinks that she may have slipped and fell forward, hitting her head.  No LOC.  As they were getting patient to the bed, she slid down and hit the posterior aspect of her head.  No LOC.  She is currently on Xarelto.  Daughter does report she is at baseline.  EM LEVEL 5 CAVEAT SECONDARY TO DEMENTIA   The history is provided by the patient.       Past Medical History:  Diagnosis Date  . A-fib ([place])   . Anemia   . Arthritis   . Basal cell carcinoma    nose  . Diabetes mellitus without complication ([place])    pt denies diabetes  . GERD (gastroesophageal reflux disease)   . Hiatal hernia   . HLD (hyperlipidemia)   . Hypertension   . Seizure [facility]) 2011    Patient Active Problem List   Diagnosis Date Noted  . Decreased appetite 01/24/2019  . SIRS (systemic inflammatory response syndrome) (HCC)   . Fall   . Chronic atrial fibrillation ([place])   . Shortness of breath   . Complicated grief   . Inanition ([place])   . Hypoxia 01/01/2019  . Swollen leg 09/27/2018  . Black stool 07/19/2018  . Constipation 07/16/2018  . Melena 07/16/2018  . Lichen sclerosus [id number]  . Grief reaction 01/19/2018  . GERD (gastroesophageal reflux disease) 07/18/2017  . Gastrointestinal hemorrhage   . Symptomatic anemia 07/17/2017  . Iron deficiency anemia 05/09/2017  . Osteoporosis 11/24/2016  . Skin cancer of nose 11/23/2016  . 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 Acute urinary  tract infection 10/26/2016  . Paroxysmal A-fib ([place]) 08/05/2016  . Essential hypertension 05/05/2016  . Type 2 diabetes mellitus without complication, without long-term current use of insulin ([place]) 05/05/2016  . Hiatal hernia 05/05/2016  . History of CVA (cerebrovascular accident) without residual deficits 05/05/2016  . Memory change 05/05/2016  . AICD (automatic cardioverter/defibrillator) present 05/05/2016  . Back pain 05/05/2016    Past Surgical History:  Procedure Laterality Date  . CARDIAC DEFIBRILLATOR PLACEMENT    . CARDIAC DEFIBRILLATOR PLACEMENT    . CATARACT EXTRACTION Left 02/2001  . CHOLECYSTECTOMY    . COLONOSCOPY WITH PROPOFOL N/A 07/19/2017   Procedure: COLONOSCOPY WITH PROPOFOL;  Surgeon: Jerene Bears, MD;  Location: [place];  Service: Gastroenterology;  Laterality: N/A;  . ESOPHAGOGASTRODUODENOSCOPY (EGD) WITH PROPOFOL N/A 07/19/2017   Procedure: ESOPHAGOGASTRODUODENOSCOPY (EGD) WITH PROPOFOL;  Surgeon: Jerene Bears, MD;  Location: [facility] ENDOSCOPY;  Service: Gastroenterology;  Laterality: N/A;  . LUMBAR LAMINECTOMY  06/11/2001  . LUMBAR LAMINECTOMY  02/09/2006  . LUMBAR LAMINECTOMY  07/07/2006   with spianal cord stimulator  . SPINAL CORD STIMULATOR REMOVAL  14/2009  . TUBAL LIGATION       OB History   No obstetric history on file.     Family History  Problem Relation Age of Onset  . Diabetes Mother   .  Heart failure Father   . Breast cancer Sister   . Heart failure Sister   . Diabetes Son   . Diabetes Sister   . Colon cancer Neg Hx   . Stomach cancer Neg Hx   . Rectal cancer Neg Hx   . Esophageal cancer Neg Hx     Social History   Tobacco Use  . Smoking status: Never Smoker  . Smokeless tobacco: Never Used  Substance Use Topics  . Alcohol use: No  . Drug use: No    Home Medications Prior to Admission medications   Medication Sig Start Date End Date Taking? Authorizing Provider  acetaminophen (TYLENOL) 500 MG tablet Take 500-1,000 mg by  mouth every 6 (six) hours as needed for mild pain.    [provider]  atorvastatin (LIPITOR) 40 MG tablet TAKE 1 TABLET(40 MG) BY MOUTH DAILY Patient taking differently: Take 40 mg by mouth daily.  10/31/18   Camnitz, Ocie Doyne, MD  ferrous sulfate 325 (65 FE) MG tablet Take 325 mg by mouth every Monday, Wednesday, and Friday.     [provider]  furosemide (LASIX) 20 MG tablet Take 1 tablet (20 mg total) by mouth daily as needed. Patient taking differently: Take 20 mg by mouth daily as needed for fluid.  10/08/18   Camnitz, Ocie Doyne, MD  metoprolol tartrate (LOPRESSOR) 100 MG tablet Take 1 tablet (100 mg total) by mouth 2 (two) times daily. 01/03/19   Rory Percy, DO  mirabegron ER (MYRBETRIQ) 25 MG TB24 tablet Take 1 tablet (25 mg total) by mouth daily. 01/24/19   Anderson, Chelsey L, DO  mirtazapine (REMERON) 7.5 MG tablet Take 1 tablet (7.5 mg total) by mouth at bedtime. 01/17/19   Rory Percy, DO  omeprazole (PRILOSEC) 40 MG capsule Take 1 capsule (40 mg total) by mouth 2 (two) times daily. Take before breakfast and dinner. 07/24/18   Armbruster, Carlota Raspberry, MD  Propylene Glycol (SYSTANE COMPLETE) 0.6 % SOLN Place 1 drop into both eyes daily.    [provider]  sucralfate (CARAFATE) 1 GM/10ML suspension TAKE 10 ML (1 GRAM TOTAL) EVERY 6 HOURS AS NEEDED Patient taking differently: Take 1 g by mouth 4 (four) times daily as needed (for stomach).  01/03/19   Rory Percy, DO  tolterodine (DETROL LA) 2 MG 24 hr capsule TAKE 1 CAPSULE(2 MG) BY MOUTH DAILY Patient taking differently: Take 2 mg by mouth daily.  01/02/19   Benay Pike, MD  verapamil (CALAN-SR) 240 MG CR tablet Take 1 tablet (240 mg total) by mouth at bedtime. 01/28/19   Camnitz, Ocie Doyne, MD  VITAMIN D PO Take 1 capsule by mouth daily.    [provider]  XARELTO 15 MG TABS tablet TAKE 1 TABLET DAILY WITH SUPPER Patient taking differently: Take 15 mg by mouth daily with supper.   12/31/18   Camnitz, Ocie Doyne, MD    Allergies    Sotalol, Erythromycin, Augmentin [amoxicillin-pot clavulanate], Crestor [rosuvastatin calcium], Keflex [cephalexin], and Septra [sulfamethoxazole-trimethoprim]  Review of Systems   Review of Systems  Unable to perform ROS: Dementia    Physical Exam Updated Vital Signs BP (!) 116/98 (BP Location: Right Arm)   Pulse 99   Temp 97.8 F (36.6 C) (Oral)   Resp 20   SpO2 100%   Physical Exam Vitals and nursing note reviewed.  Constitutional:      Appearance: Normal appearance. She is well-developed.  HENT:     Head: Normocephalic and atraumatic.  Comments: Hematoma noted in left frontal aspect of scalp.  No underlying skull deformity or crepitus noted.  Mild tenderness palpation of posterior scalp.  No underlying skull deformity or crepitus noted.  Eyes:     General: Lids are normal.     Conjunctiva/sclera: Conjunctivae normal.     Pupils: Pupils are equal, round, and reactive to light.     Comments: PERRL. EOMs intact. No nystagmus. No neglect.   Neck:     Comments: No midline C-spine tenderness. Cardiovascular:     Rate and Rhythm: Normal rate and regular rhythm.     Pulses: Normal pulses.     Heart sounds: Normal heart sounds. No murmur. No friction rub. No gallop.   Pulmonary:     Effort: Pulmonary effort is normal.     Breath sounds: Normal breath sounds.  Abdominal:     Palpations: Abdomen is soft. Abdomen is not rigid.     Tenderness: There is no abdominal tenderness. There is no guarding.     Comments: Abdomen is soft, non-distended, non-tender. No rigidity, No guarding. No peritoneal signs.  Musculoskeletal:        General: Normal range of motion.     Cervical back: Full passive range of motion without pain.     Comments: No pelvic instability   Skin:    General: Skin is warm and dry.     Capillary Refill: Capillary refill takes less than 2 seconds.  Neurological:     Mental Status: She is alert.      Comments: Cranial nerves III-XII intact Follows commands, Moves all extremities  5/5 strength to BUE and BLE  Sensation intact throughout all major nerve distributions No slurred speech. No facial droop.  Alert and oriented x 2 which daughter states is baseline   Psychiatric:        Speech: Speech normal.     ED Results / Procedures / Treatments   Labs (all labs ordered are listed, but only abnormal results are displayed) Labs Reviewed - No data to display  EKG None  Radiology CT Head Wo Contrast  Result Date: 03/15/2019 CLINICAL DATA:  85 year old female status post fall at home. Dementia. EXAM: CT HEAD WITHOUT CONTRAST TECHNIQUE: Contiguous axial images were obtained from the base of the skull through the vertex without intravenous contrast. COMPARISON:  Head CT 02/07/2019. FINDINGS: Brain: Stable cerebral volume, ventricle size and configuration. No midline shift, mass effect, or evidence of intracranial mass lesion. No acute intracranial hemorrhage identified. No cortically based acute infarct identified. Patchy and confluent bilateral cerebral white matter hypodensity appears stable, with involvement of the deep white matter capsules. No cortical encephalomalacia identified. Vascular: Calcified atherosclerosis at the skull base. No suspicious intracranial vascular hyperdensity. Skull: No acute osseous abnormality identified. Sinuses/Orbits: Chronic right sphenoid opacification. Other visualized paranasal sinuses and mastoids are stable and well pneumatized. Other: Calcified scalp vessel atherosclerosis. No acute orbit or scalp soft tissue findings. IMPRESSION: No acute traumatic injury identified. Stable brain with chronic volume loss and small vessel disease. Electronically Signed   By: Genevie Ann M.D.   On: 03/15/2019 16:35   CT CERVICAL SPINE WO CONTRAST  Result Date: 03/15/2019 CLINICAL DATA:  85 year old female status post fall at home. Dementia. EXAM: CT CERVICAL SPINE WITHOUT  CONTRAST TECHNIQUE: Multidetector CT imaging of the cervical spine was performed without intravenous contrast. Multiplanar CT image reconstructions were also generated. COMPARISON:  Cervical spine CT 01/15/2019. FINDINGS: Alignment: Stable exaggerated cervical lordosis. Stable mild anterolisthesis at the cervicothoracic junction.  Bilateral posterior element alignment is within normal limits. Skull base and vertebrae: Osteopenia. Visualized skull base is intact. No atlanto-occipital dissociation. No acute osseous abnormality identified in the cervical spine. Soft tissues and spinal canal: No prevertebral fluid or swelling. No visible canal hematoma. Calcified carotid atherosclerosis in the neck. Disc levels: Stable cervical spine degeneration with capacious spinal canal. Upper chest: Chronic appearing mild T3 and T4 compression fractures were partially visible in October. Other visible upper thoracic levels appear intact. Left chest cardiac pacemaker device. Large lung volumes, upper lungs are clear. Calcified aortic atherosclerosis. IMPRESSION: 1. No acute traumatic injury identified in the cervical spine. 2. Chronic appearing mild T3 and T4 compression fractures. 3. Carotid and Aortic atherosclerosis (ICD10-I70.0). Electronically Signed   By: Genevie Ann M.D.   On: 03/15/2019 16:40    Procedures Procedures (including critical care time)  Medications Ordered in ED Medications - No data to display  ED Course  I have reviewed the triage vital signs and the nursing notes.  Pertinent labs & imaging results that were available during my care of the patient were reviewed by me and considered in my medical decision making (see chart for details).    MDM Rules/Calculators/A&P                      85 year old female past history of dementia, currently on Xarelto who presents for evaluation of low mechanism fall that occurred just prior to ED arrival.  Daughter reports that she was helping patient get up out of  the bathroom and states that patient slipped and fell forward hitting her head.  No LOC.  As they were putting her in bed, she had the posterior aspect of her bed.  Given that she was on blood thinners, they brought her to the emergency department.  Initially arrival, she is afebrile, nontoxic.  She is alert and oriented x2 which daughter states is baseline.  Daughter did report she had some coughing up/spitting up with during her stay in the waiting room.  Otherwise no other acute complaints.  On my evaluation, she has no neuro deficits.  She does have a small hematoma noted to the left frontal scalp.  Imaging ordered at triage.  CT head negative for any acute abnormalities.  CT C-spine shows chronic compression fractures of the T-spine which were seen on previous imaging on October.  No acute cervical injury.  Discussed results with patient and daughter. At this time, patient exhibits no emergent life-threatening condition that require further evaluation in ED. Discussed minor head injury precautions with patient. Patient and daughter  had ample opportunity for questions and discussion. All patient's questions were answered with full understanding. Strict return precautions discussed. Patient expresses understanding and agreement to plan.   Portions of this note were generated with Lobbyist. Dictation errors may occur despite best attempts at proofreading.  Final Clinical Impression(s) / ED Diagnoses Final diagnoses:  Fall, initial encounter  Minor head injury, initial encounter    Rx / DC Orders ED Discharge Orders    None       Desma Mcgregor 03/15/19 2322    Wyvonnia Dusky, MD 03/16/19 1134

## 2019-04-11 ENCOUNTER — Other Ambulatory Visit: Payer: Self-pay

## 2019-04-11 ENCOUNTER — Encounter: Payer: Self-pay | Admitting: Physician Assistant

## 2019-04-11 ENCOUNTER — Telehealth: Payer: Self-pay | Admitting: Cardiology

## 2019-04-11 ENCOUNTER — Telehealth (INDEPENDENT_AMBULATORY_CARE_PROVIDER_SITE_OTHER): Payer: Medicare Other | Admitting: Physician Assistant

## 2019-04-11 VITALS — BP 87/44 | HR 71 | Ht 60.0 in | Wt 125.0 lb

## 2019-04-11 DIAGNOSIS — I472 Ventricular tachycardia, unspecified: Secondary | ICD-10-CM

## 2019-04-11 DIAGNOSIS — I480 Paroxysmal atrial fibrillation: Secondary | ICD-10-CM | POA: Diagnosis not present

## 2019-04-11 DIAGNOSIS — R079 Chest pain, unspecified: Secondary | ICD-10-CM

## 2019-04-11 MED ORDER — ISOSORBIDE MONONITRATE ER 30 MG PO TB24: 30.0000 mg | ORAL_TABLET | Freq: Every day | ORAL | 3 refills | Status: AC

## 2019-04-11 NOTE — Progress Notes (Signed)
Virtual Visit via Video Note   This visit type was conducted due to national recommendations for restrictions regarding the COVID-19 Pandemic (e.g. social distancing) in an effort to limit this patient's exposure and mitigate transmission in our community.  Due to her co-morbid illnesses, this patient is at least at moderate risk for complications without adequate follow up.  This format is felt to be most appropriate for this patient at this time.  All issues noted in this document were discussed and addressed.  A limited physical exam was performed with this format.  Please refer to the patient's chart for her consent to telehealth for [facility].   Date:  04/11/2019   ID:  Brenda [name], DOB [birth year], MRN [id number]  Patient Location: Home Provider Location: Office  PCP:  Benay Pike, MD  Cardiologist:  Constance Haw, MD  Electrophysiologist:  None   Evaluation Performed:  Follow-Up Visit  Chief Complaint:  CP  History of Present Illness:    Brenda [name] is a 86 y.o. female with history of VT s/p ICD, PAF, CVA, diabetes, and hypertension added for CP.   Echo November 2017 showed a preserved ejection fraction.  She has had rapid atrial fibrillation and has had shocks by her ICD. Anticoagulated with Xarelto.   Added to my schedule for CP evaluation. No arrhthymias on device check.  Patient has intermittent left-sided chest pain with occasional radiation to her left neck.  No associated shortness of breath, diaphoresis or nausea.  This been going on for past few weeks.  She has sedentary lifestyle.  Seen in conjunction with her daughter.   The patient does not have symptoms concerning for COVID-19 infection (fever, chills, cough, or new shortness of breath).    Past Medical History:  Diagnosis Date  . A-fib ([place])   . Anemia   . Arthritis   . Basal cell carcinoma    nose  . Diabetes mellitus without complication ([place])    pt denies diabetes  . GERD  (gastroesophageal reflux disease)   . Hiatal hernia   . HLD (hyperlipidemia)   . Hypertension   . Seizure ([place]) 2011   Past Surgical History:  Procedure Laterality Date  . CARDIAC DEFIBRILLATOR PLACEMENT    . CARDIAC DEFIBRILLATOR PLACEMENT    . CATARACT EXTRACTION Left 02/2001  . CHOLECYSTECTOMY    . COLONOSCOPY WITH PROPOFOL N/A 07/19/2017   Procedure: COLONOSCOPY WITH PROPOFOL;  Surgeon: Jerene Bears, MD;  Location: [place];  Service: Gastroenterology;  Laterality: N/A;  . ESOPHAGOGASTRODUODENOSCOPY (EGD) WITH PROPOFOL N/A 07/19/2017   Procedure: ESOPHAGOGASTRODUODENOSCOPY (EGD) WITH PROPOFOL;  Surgeon: Jerene Bears, MD;  Location: [facility] ENDOSCOPY;  Service: Gastroenterology;  Laterality: N/A;  . LUMBAR LAMINECTOMY  06/11/2001  . LUMBAR LAMINECTOMY  02/09/2006  . LUMBAR LAMINECTOMY  07/07/2006   with spianal cord stimulator  . SPINAL CORD STIMULATOR REMOVAL  14/2009  . TUBAL LIGATION       Current Meds  Medication Sig  . acetaminophen (TYLENOL) 500 MG tablet Take 500-1,000 mg by mouth every 6 (six) hours as needed for mild pain.  Marland Kitchen atorvastatin (LIPITOR) 40 MG tablet Take 40 mg by mouth daily.  . ferrous sulfate 325 (65 FE) MG tablet Take 325 mg by mouth every Monday, Wednesday, and Friday.   . furosemide (LASIX) 20 MG tablet Take 20 mg by mouth daily as needed for fluid.  . metoprolol tartrate (LOPRESSOR) 100 MG tablet Take 1 tablet (100 mg total) by mouth 2 (two) times daily.  Marland Kitchen  omeprazole (PRILOSEC) 40 MG capsule Take 1 capsule (40 mg total) by mouth 2 (two) times daily. Take before breakfast and dinner.  Marland Kitchen Propylene Glycol (SYSTANE COMPLETE) 0.6 % SOLN Place 1 drop into both eyes daily.  . Rivaroxaban (XARELTO) 15 MG TABS tablet Take 15 mg by mouth daily with supper.  . sucralfate (CARAFATE) 1 GM/10ML suspension Take 1 g by mouth every 6 (six) hours as needed.  . tolterodine (DETROL LA) 2 MG 24 hr capsule Take 2 mg by mouth daily.  . verapamil (CALAN-SR) 240 MG CR tablet  Take 1 tablet (240 mg total) by mouth at bedtime.  Marland Kitchen VITAMIN D PO Take 1 capsule by mouth daily.     Allergies:   Sotalol, Erythromycin, Augmentin [amoxicillin-pot clavulanate], Crestor [rosuvastatin calcium], Keflex [cephalexin], and Septra [sulfamethoxazole-trimethoprim]   Social History   Tobacco Use  . Smoking status: Never Smoker  . Smokeless tobacco: Never Used  Substance Use Topics  . Alcohol use: No  . Drug use: No     Family Hx: The patient's family history includes Breast cancer in her sister; Diabetes in her mother, sister, and son; Heart failure in her father and sister. There is no history of Colon cancer, Stomach cancer, Rectal cancer, or Esophageal cancer.  ROS:   Please see the history of present illness.    All other systems reviewed and are negative.   Prior CV studies:   The following studies were reviewed today:  Echo 12/2018 1. Left ventricular ejection fraction, by visual estimation, is 60 to 65%. The left ventricle has normal function. Normal left ventricular size. Left ventricular septal wall thickness was normal. There is no left ventricular hypertrophy.  2. Left ventricular diastolic Doppler parameters are consistent with pseudonormalization pattern of LV diastolic filling.  3. Global right ventricle has normal systolic function.The right ventricular size is not well visualized. Right vetricular wall thickness was not assessed.  4. Pacing wire seen in RA/RV.  5. Left atrial size was normal.  6. Right atrial size was normal.  7. Moderate calcification of the mitral valve leaflet(s).  8. Moderate mitral annular calcification.  9. Moderate thickening of the mitral valve leaflet(s). 10. The mitral valve is normal in structure. Mild to moderate mitral valve regurgitation. 11. The tricuspid valve is normal in structure. Tricuspid valve regurgitation is mild. 12. The aortic valve is tricuspid Aortic valve regurgitation was not visualized by color flow  Doppler. moderate sclerosis with fused right and left cusps. 13. The pulmonic valve was not well visualized. Pulmonic valve regurgitation is mild by color flow Doppler. 14. Normal pulmonary artery systolic pressure. 15. The interatrial septum was not well visualized.  Labs/Other Tests and Data Reviewed:    EKG:  No ECG reviewed.  Recent Labs: 01/01/2019: ALT 16; B Natriuretic Peptide 229.4 01/15/2019: Magnesium 1.9; TSH 4.915 01/16/2019: BUN 27; Creatinine, Ser 1.18; Potassium 4.0; Sodium 136 01/24/2019: Hemoglobin 8.8; Platelets 313   Recent Lipid Panel No results found for: CHOL, TRIG, HDL, CHOLHDL, LDLCALC, LDLDIRECT  Wt Readings from Last 3 Encounters:  04/11/19 125 lb (56.7 kg)  01/17/19 137 lb (62.1 kg)  01/03/19 131 lb 9.8 oz (59.7 kg)     Objective:    Vital Signs:  BP (!) 87/44   Pulse 71   Ht 5' (1.524 m)   Wt 125 lb (56.7 kg)   SpO2 97%   BMI 24.41 kg/m    VITAL SIGNS:  reviewed GEN:  no acute distress EYES:  sclerae anicteric, EOMI - Extraocular  Movements Intact RESPIRATORY:  normal respiratory effort, symmetric expansion CARDIOVASCULAR:  Trace R lower extremity edema no edema on left lower extremity, no erythema at pacemaker site SKIN:  no rash, lesions or ulcers. MUSCULOSKELETAL:  no obvious deformities. NEURO:  alert and oriented x 3, no obvious focal deficit PSYCH:  normal affect  ASSESSMENT & PLAN:    1. Chest pain Patient reports left-sided chest pain with occasional radiation to her neck.  No associated shortness of breath, diaphoresis or nausea.  She has sedentary lifestyle.  Takes her medication as described.  Her symptoms could be anginal equivalent.  After long discussion with daughter will pursue medical therapy with addition of Imdur 30 mg daily.  We may consider stress test during follow-up visit.  2.  History of VT s/p ICD -Normal functioning device  3.  PAF -Denies palpitation.  Continue Xarelto.  No change in rate control  agent.  COVID-19 Education: The signs and symptoms of COVID-19 were discussed with the patient and how to seek care for testing (follow up with PCP or arrange E-visit).  The importance of social distancing was discussed today.  Time:   Today, I have spent 11 minutes with the patient with telehealth technology discussing the above problems.     Medication Adjustments/Labs and Tests Ordered: Current medicines are reviewed at length with the patient today.  Concerns regarding medicines are outlined above.   Tests Ordered: No orders of the defined types were placed in this encounter.   Medication Changes: Meds ordered this encounter  Medications  . isosorbide mononitrate (IMDUR) 30 MG 24 hr tablet    Sig: Take 1 tablet (30 mg total) by mouth daily.    Dispense:  90 tablet    Refill:  3    Follow Up:  Virtual Visit  in 3 week(s)  Signed, Leanor Kail, PA  04/11/2019 3:06 PM    [facility]

## 2019-04-11 NOTE — Telephone Encounter (Signed)
Transmission received. Presenting rhythm is AP/VS at 70bpm. Normal ICD function. No episodes since 12/2018. Lead trends stable.  Spoke with pt's daughter, Donavan Burnet. Pt currently asleep. Per [name], pt has been mostly bed ridden since last year and her husband died in 2017-06-23. Pt has been struggling. [name] reports she had to call out of work today as pt's aide was not available. Pt reported that pt woke up this AM reporting symptoms overnight, but she is unsure of how long symptoms lasted. Advised that since ICD function reveals no recent arrhythmias, will forward back to triage team for recommendations. [name] in agreement with plan.

## 2019-04-11 NOTE — Telephone Encounter (Signed)
I called the pt daughter to help her send a manual transmission with her home remote monitor. I conference the daughter and latitude to trouble shoot the monitor. Transmission received. I let her talk to the device nurse Raquel Sarna, Rn.

## 2019-04-11 NOTE — Telephone Encounter (Signed)
After speaking with DOD, Dr. Irish Lack, pt should be set up with a virtual appointment asap for evaluation of CP since she has had no recent arrhythmias. Pt unable to leave home, so virtual visit is necessary.  Pt's device is too old to determine fluid volume status.   Pt's daughter was called and agreed to virtual visit. She will have recent vitals available and able to participate in a video call.  She had no further questions at this time.

## 2019-04-11 NOTE — Telephone Encounter (Signed)
Pts daughter calling to report pt has c/o intermittent CP. She denies SOB, diaphoresis, back pain. Pt confirms some light left arm pain during the night. She states she is cold, but denies chills. It is unknown if she has a fever as they do not have a thermometer available.   I advised pt's daughter she should send in a remote check to determine if her sx are from arrhythmias. Pts daughter will need someone from the device clinic to call her and walk her through the manual transmission.  Will forward to device to follow up.

## 2019-04-11 NOTE — Patient Instructions (Signed)
Medication Instructions:  Your physician has recommended you make the following change in your medication:  START: IMDUR 30 MG DAILY.  *If you need a refill on your cardiac medications before your next appointment, please call your pharmacy*  Lab Work: NONE If you have labs (blood work) drawn today and your tests are completely normal, you will receive your results only by: Marland Kitchen MyChart Message (if you have MyChart) OR . A paper copy in the mail If you have any lab test that is abnormal or we need to change your treatment, we will call you to review the results.  Testing/Procedures: NONE  Follow-Up: At [facility], you and your health needs are our priority.  As part of our continuing mission to provide you with exceptional heart care, we have created designated Provider Care Teams.  These Care Teams include your primary Cardiologist (physician) and Advanced Practice Providers (APPs -  Physician Assistants and Nurse Practitioners) who all work together to provide you with the care you need, when you need it.  Your next appointment:   3 week(s)  The format for your next appointment:   Virtual Visit   Provider:   You may see DR. CAMNITZ or one of the following Advanced Practice Providers on your designated Care Team:    [place], [place]  Tommye Standard, PA-C  Legrand Como "[place]" Ingram, [place]

## 2019-04-11 NOTE — Telephone Encounter (Signed)
Pt c/o of Chest Pain: STAT if CP now or developed within 24 hours  1. Are you having CP right now? no  2. Are you experiencing any other symptoms (ex. SOB, nausea, vomiting, sweating)? Headaches all over her head, she is high risk for falling. Had a fall last month but CAT Scan showed nothing was wrong.  3. How long have you been experiencing CP? Over the night, on her left side.  4. Is your CP continuous or coming and going? Coming and going  5. Have you taken Nitroglycerin? no ?

## 2019-04-16 ENCOUNTER — Telehealth: Payer: Self-pay

## 2019-04-16 NOTE — Telephone Encounter (Signed)
Spoke with patient's son, Brenda [name]. Reiterated education provided by Candice Camp, CMA. Pt's son reports he was in telecommunications in [employer] and is interested in this monitor.  Advised if he has any additional technical questions related to the monitor, may be helpful to discuss with tech services. Pt's son verbalizes understanding, plans to read manual and call tech support with any additional questions. No additional concerns at this time.

## 2019-04-16 NOTE — Telephone Encounter (Signed)
I called the pt because she sent an unscheduled transmission. I let the pt son know that the monitor send automatically as long as the pt sleep within 4-6 ft of the monitor. The pt son had additional questions and I let him talk with the device nurse Raquel Sarna, Rn.

## 2019-04-25 ENCOUNTER — Other Ambulatory Visit: Payer: Self-pay

## 2019-04-25 ENCOUNTER — Ambulatory Visit (INDEPENDENT_AMBULATORY_CARE_PROVIDER_SITE_OTHER): Payer: Medicare Other | Admitting: Family Medicine

## 2019-04-25 VITALS — BP 92/58 | HR 86 | Wt 120.0 lb

## 2019-04-25 DIAGNOSIS — K5900 Constipation, unspecified: Secondary | ICD-10-CM

## 2019-04-25 DIAGNOSIS — R4589 Other symptoms and signs involving emotional state: Secondary | ICD-10-CM | POA: Diagnosis not present

## 2019-04-25 DIAGNOSIS — R630 Anorexia: Secondary | ICD-10-CM

## 2019-04-25 DIAGNOSIS — Z7189 Other specified counseling: Secondary | ICD-10-CM | POA: Diagnosis not present

## 2019-04-25 DIAGNOSIS — N390 Urinary tract infection, site not specified: Secondary | ICD-10-CM | POA: Diagnosis not present

## 2019-04-25 LAB — POCT URINALYSIS DIP (MANUAL ENTRY)
Bilirubin, UA: NEGATIVE
Glucose, UA: NEGATIVE mg/dL
Ketones, POC UA: NEGATIVE mg/dL
Nitrite, UA: NEGATIVE
Protein Ur, POC: 100 mg/dL — AB
Spec Grav, UA: 1.02 (ref 1.010–1.025)
Urobilinogen, UA: 0.2 E.U./dL
pH, UA: 6 (ref 5.0–8.0)

## 2019-04-25 LAB — POCT UA - MICROSCOPIC ONLY

## 2019-04-25 MED ORDER — MIRTAZAPINE 7.5 MG PO TABS: 7.5000 mg | ORAL_TABLET | Freq: Every day | ORAL | 1 refills | Status: DC

## 2019-04-25 MED ORDER — CIPROFLOXACIN HCL 500 MG PO TABS: 500.0000 mg | ORAL_TABLET | Freq: Two times a day (BID) | ORAL | 0 refills | Status: AC

## 2019-04-25 NOTE — Patient Instructions (Signed)
Thank you for coming to see me today. It was a pleasure. Today we talked about:   She has a UTI, I will send a culture.  She should take Ciprofloxacin twice a day for 10 days, if no improvement in symptoms, please come back.  I will refer her to the geriatric clinic.  Someone will call you to scheduled a home visit.  If you have any questions or concerns, please do not hesitate to call the office at [phone].  Best,   [place] Constable, DO

## 2019-04-25 NOTE — Assessment & Plan Note (Addendum)
Dysuria with mild suprapubic tenderness.  UA consistent with UTI.  Given she is symptomatic, will treat.  She has numerous allergies to medications including Keflex.  Given her age, she technically meetscriteria for complicated cystitis.  Ciprofloxacin would be best choice given antibiotic limitations and adequate treatment for complicated cystitis.  Will treat empirically with Cipro, urine culture sent.  Advised to come back if no improvement or worsening.

## 2019-04-25 NOTE — Assessment & Plan Note (Signed)
Given patient's very complex social as well as medical situation, CCM has been consulted.  Will attempt to see if patient can also get PCA to help assist her in conjunction with home health RN that she already has.  Will also forward this visit to PCP.

## 2019-04-25 NOTE — Assessment & Plan Note (Signed)
Advised patient that if she is constipated, to not tried to manually disimpact herself.  Advised that she should ask her daughter or her healthcare provider for a stool softener to help her have a bowel movement.

## 2019-04-25 NOTE — Progress Notes (Signed)
Subjective: Chief Complaint  Patient presents with  . Urinary Tract Infection     HPI: Brenda [name] is a 86 y.o. presenting to clinic today to discuss the following:  Urination Concerns Concern today is that patient's urine has been cloudy and she has been complaining of itching when urinating.  Daughter is present with the patient today and thinks that this has been going on for about a week, possibly 2.  Denies that patient has had any fevers.  Has not been complaining of specific back pain.  Mood Concerns  Patient has an underlying history of dementia.  She asks the daughter for knife all the time because she states that she wants to die and go be with her late husband.  Daughter states that this happens frequently.  She states that she has given her nail clippers before and has found her attempting   Patient states, "I just don't care.  I just want to be out of my misery."  Daughter reports that this is getting much worse.  Constipation Patient's daughter states that she seems obsessed with the idea of having a bowel movement daily.  She states that she has told her mother repeatedly that she does not need to have a bowel movement every day if her stomach is not bothering her.  She states that she has found that the patient will place her finger into her rectum and try to "dig out her poop."  The patient denies any abdominal pain.  Care Concerns Daughter states that she has a home health nurse, but they have had difficulties with the quality of the nurses.  She is trying to get a reliable full time nurse.  Daughter has been looking into SNFs for her mother.  The daughter reports that previously, her mother did not want to go to a SNF, but has recently been saying that she wants to go.  Daughter reports that patient has been unable to walk since last discharge in October 2020.  And it has been very difficult to care for her.  Daughter states "she does not use her legs, she  just becomes dead weight.  My brother and I cannot pick her up.  Even trying to get her in here, she went limp on [place] and we had to lower her to the ground."  Daughter also reports that they have a very strained relationship.  She states that just in the waiting room today, the patient was yelling that she was not taking care of her.  Her daughter states, "my mother has hated me and been awful to me my whole life, but it is getting worse.  I am just trying to do what I can to take care of her."  Brenda [name], Gaffer, went out to help patient get in wheelchair and found patient outside in fetal position on the ground.  He reports that he asked the patient's daughter and patient's son who was with them if she fell and they stated no. Brenda [name] then proceeded to pick patient up off the ground and place her in the wheelchair to bring her in.  Decreased appetite Patient reports that her mother was placed on Remeron 7.5 mg while in the hospital in October.  She states that she thinks this helped improve her appetite, but last time she was in the office to get a refill, they were sent to the ED right away and she did not have a chance to ask the provider if they  can have a refill.  The patient has been without this for the last few months, she thinks she has noticed a decrease in her appetite since then and would like a refill on this medication.     ROS noted in HPI. Chief complaint noted.  Other Pertinent PMH: Chronic A. fib on Xarelto, T2DM, Osteoporosis, history of CVA, decreased appetite Past Medical, Surgical, Social, and Family History Reviewed & Updated per EMR.      Social History   Tobacco Use  Smoking Status Never Smoker  Smokeless Tobacco Never Used   Smoking status noted.    Objective: BP (!) 92/58   Pulse 86   Wt 120 lb (54.4 kg)   SpO2 97%   BMI 23.44 kg/m  Vitals and nursing notes reviewed  Physical Exam:  General: 86 y.o. female, frail, ill-appearing Lungs: breathing  comfortably on RA Abdomen: Soft, mild suprapubic tenderness, non-distended Skin: warm and dry MSK: significant thoracic kyphosis  Psych: intermittently tearful during exam   Results for orders placed or performed in visit on 04/25/19 (from the past 72 hour(s))  POCT urinalysis dipstick     Status: Abnormal   Collection Time: 04/25/19  4:23 PM  Result Value Ref Range   Color, UA yellow yellow   Clarity, UA cloudy (A) clear   Glucose, UA negative negative mg/dL   Bilirubin, UA negative negative   Ketones, POC UA negative negative mg/dL   Spec Grav, UA 1.020 1.010 - 1.025   Blood, UA moderate (A) negative   pH, UA 6.0 5.0 - 8.0   Protein Ur, POC =100 (A) negative mg/dL   Urobilinogen, UA 0.2 0.2 or 1.0 E.U./dL   Nitrite, UA Negative Negative   Leukocytes, UA Large (3+) (A) Negative  POCT UA - Microscopic Only     Status: Abnormal   Collection Time: 04/25/19  4:23 PM  Result Value Ref Range   WBC, Ur, HPF, POC TOO MANY TO COUNT    RBC, urine, microscopic PRESENT     Comment: unable to count due to the number of WBC   Bacteria, U Microscopic 3+    Epithelial cells, urine per micros OCCASIONAL     Assessment/Plan:  Urinary tract infection without hematuria Dysuria with mild suprapubic tenderness.  UA consistent with UTI.  Given she is symptomatic, will treat.  She has numerous allergies to medications including Keflex.  Given her age, she technically meetscriteria for complicated cystitis.  Ciprofloxacin would be best choice given antibiotic limitations and adequate treatment for complicated cystitis.  Will treat empirically with Cipro, urine culture sent.  Advised to come back if no improvement or worsening.  Depressed mood Patient is obviously significantly depressed and endorsing SI.  Spoke with daughter at length about keeping weapons away from her mother, and no longer allowing her to use fingernail clippers given that she has tried to use these to harm herself.  Given that she  is unable to ambulate by herself, she should be safe from this aspect.  Spoke with Brenda [name], social worker, who agreed that patient is safe to go home given this.  Will have patient follow-up with geriatric clinic.  Spoke with Dr. Wendy Poet, who notes that patient can be seen as a home visit given it is so difficult to transport her.  Follow-up for this will be arranged.  Complex care coordination Given patient's very complex social as well as medical situation, CCM has been consulted.  Will attempt to see if patient can also get PCA  to help assist her in conjunction with home health RN that she already has.  Will also forward this visit to PCP.  Poor appetite Patient's daughter reports that her appetite improved with Remeron.  Will send Rx for 2 months, and defer to geriatric clinic for continuation versus discontinuation.  Constipation Advised patient that if she is constipated, to not tried to manually disimpact herself.  Advised that she should ask her daughter or her healthcare provider for a stool softener to help her have a bowel movement.     PATIENT EDUCATION PROVIDED: See AVS    Diagnosis and plan along with any newly prescribed medication(s) were discussed in detail with this patient today. The patient verbalized understanding and agreed with the plan. Patient advised if symptoms worsen return to clinic or ER.    Orders Placed This Encounter  Procedures  . Urine Culture  . Urinalysis, dipstick only  . Ambulatory referral to Chronic Care Management Services    Referral Priority:   Routine    Referral Type:   Consultation    Referral Reason:   Care Coordination    Number of Visits Requested:   1  . POCT urinalysis dipstick  . POCT UA - Microscopic Only    Meds ordered this encounter  Medications  . ciprofloxacin (CIPRO) 500 MG tablet    Sig: Take 1 tablet (500 mg total) by mouth 2 (two) times daily for 10 days.    Dispense:  20 tablet    Refill:  0  . mirtazapine  (REMERON) 7.5 MG tablet    Sig: Take 1 tablet (7.5 mg total) by mouth at bedtime.    Dispense:  30 tablet    Refill:  [place], DO 04/25/2019, 8:30 PM PGY-2 [place]

## 2019-04-25 NOTE — Assessment & Plan Note (Signed)
Patient is obviously significantly depressed and endorsing SI.  Spoke with daughter at length about keeping weapons away from her mother, and no longer allowing her to use fingernail clippers given that she has tried to use these to harm herself.  Given that she is unable to ambulate by herself, she should be safe from this aspect.  Spoke with Casimer Lanius, social worker, who agreed that patient is safe to go home given this.  Will have patient follow-up with geriatric clinic.  Spoke with Dr. Wendy Poet, who notes that patient can be seen as a home visit given it is so difficult to transport her.  Follow-up for this will be arranged.

## 2019-04-25 NOTE — Assessment & Plan Note (Signed)
Patient's daughter reports that her appetite improved with Remeron.  Will send Rx for 2 months, and defer to geriatric clinic for continuation versus discontinuation.

## 2019-04-26 ENCOUNTER — Ambulatory Visit: Payer: Self-pay | Admitting: Licensed Clinical Social Worker

## 2019-04-26 NOTE — Progress Notes (Signed)
This patient has not seen me in person yet.  Can we schedule her an appointment with me personally so that I can discuss her many chronic issues.

## 2019-04-26 NOTE — Chronic Care Management (AMB) (Signed)
  Care Management    Clinical Social Work Care Management Outreach Note  04/26/2019 Name: Brenda [name] MRN: [id number] DOB: Aug 14, 1931  [name] is a 86 y.o. year old female who is a primary care patient of Benay Pike, MD . The Care Management team was consulted for assistance with Community Resources  and Level of Care Concerns for possible personal care services.   LCSW reached out to Cathalene Sinclair daughter  today by phone to introduce self and offer Care Management services. A HIPPA compliant phone message was left for the patient providing contact information and requesting a return call.   Follow Up Plan: LCSW will wait for return call.  If no return call is received will call again in 3 to 5 days.  Casimer Lanius, LCSW Clinical Social Worker [place] / [place]   [phone] 11:42 AM

## 2019-04-27 LAB — URINE CULTURE

## 2019-04-30 ENCOUNTER — Other Ambulatory Visit: Payer: Self-pay

## 2019-04-30 ENCOUNTER — Telehealth (INDEPENDENT_AMBULATORY_CARE_PROVIDER_SITE_OTHER): Payer: Medicare Other | Admitting: Cardiology

## 2019-04-30 ENCOUNTER — Ambulatory Visit: Payer: Medicare Other | Admitting: Licensed Clinical Social Worker

## 2019-04-30 ENCOUNTER — Telehealth: Payer: Self-pay | Admitting: Cardiology

## 2019-04-30 ENCOUNTER — Encounter: Payer: Self-pay | Admitting: Licensed Clinical Social Worker

## 2019-04-30 DIAGNOSIS — I10 Essential (primary) hypertension: Secondary | ICD-10-CM | POA: Diagnosis not present

## 2019-04-30 DIAGNOSIS — Z7901 Long term (current) use of anticoagulants: Secondary | ICD-10-CM | POA: Diagnosis not present

## 2019-04-30 DIAGNOSIS — I480 Paroxysmal atrial fibrillation: Secondary | ICD-10-CM

## 2019-04-30 DIAGNOSIS — Z7189 Other specified counseling: Secondary | ICD-10-CM

## 2019-04-30 DIAGNOSIS — I472 Ventricular tachycardia: Secondary | ICD-10-CM | POA: Diagnosis not present

## 2019-04-30 DIAGNOSIS — Z95810 Presence of automatic (implantable) cardiac defibrillator: Secondary | ICD-10-CM

## 2019-04-30 NOTE — Chronic Care Management (AMB) (Signed)
   Clinical Social Work  Care Management referral   04/30/2019 Name: Latrina Marcone MRN: [id number] DOB: [birth year]  Jahlia Readus is a 86 y.o. year old female who is a primary care patient of Jeannine Kitten Garen Lah, MD . LCSW was consulted by Dr. Louanne Belton  for assistance with Level of Care Concerns and additional home support.  LCSW reached out to Rubye Bia' daughter [name] today by phone to introduce self, assess needs and provide intervention. Assessment: Patient has an Therapist, sports that meets her ADL's  when daughter is out of the home. Recommendation: Patient may benefit from, and is in agreement to explore facility placement. Intervention:SDOH (Social Determinants of Health) screening performed : challenges identified: None. Also assessed for advance directives.  Per daughter patient has advance directives in place.  She is HPOA.  Daughter asked to provide document.  See care plan below   Review of patient status, including review of consultants reports, relevant laboratory and other test results, and collaboration with appropriate care team members and the patient's provider was performed as part of comprehensive patient evaluation and provision of care management services.   Goals Addressed            This Visit's Progress   . possible placement       Current Barriers:   . Care Coordination Needs related to facility placement for long term skilled nursing facility  in patient with HTN, DMII, and History of CVA.   Marland Kitchen Patient's health has declined and no longer able to meet ADL's . Patient currently has nurse care 8 hours a day when daughter is at work  Clinical Social Work Goal(s): No goal set at this time for placement as family is gathering information in order Interventions completed by LCSW: . Assessed needs and provided education on level of care and facility placement process . Will mail packet of information to daughter ( list of facilities, and understanding the placement  process) . LCSW will be available to answer questions as needed. Patient Self Care Activities & Deficits:  . Patient is unable to perform ADL's,  . Patient has [facility] nurse paid from her long-term care insurance   Initial goal documentation    Plan:   1. Daughter will review packet of information and call LCSW if she has questions 2. Daughter will follow up with [facility] for ongoing placement support 3. No follow up required by LCSW at this time.    Casimer Lanius, LCSW Clinical Social Worker [place] / [place]   [phone] 1:42 PM

## 2019-04-30 NOTE — Progress Notes (Signed)
Electrophysiology TeleHealth Note   Due to national recommendations of social distancing due to COVID 19, an audio/video telehealth visit is felt to be most appropriate for this patient at this time.  See Epic message for the patient's consent to telehealth for [facility].   Date:  04/30/2019   ID:  Brenda [name], DOB 12-Jul-1931, MRN [id number]  Location: patient's home  Provider location: [address], [place]  Evaluation Performed: Follow-up visit  PCP:  Benay Pike, MD  Cardiologist:  Espiridion Supinski Meredith Leeds, MD  Electrophysiologist:  Dr Curt Bears  Chief Complaint:  AF  History of Present Illness:    Brenda [name] is a 86 y.o. female who presents via audio/video conferencing for a telehealth visit today.  Since last being seen in our clinic, the patient reports doing very well.  Today, she denies symptoms of palpitations, chest pain, shortness of breath,  lower extremity edema, dizziness, presyncope, or syncope.  The patient is otherwise without complaint today.  The patient denies symptoms of fevers, chills, cough, or new SOB worrisome for COVID 19.  She has a history significant for atrial fibrillation, diabetes, hiatal hernia, hyperlipidemia, hypertension.  She has a [place] which was implanted for inducible ventricular tachycardia.  She has gotten multiple episodes of inappropriate therapy due to rapid atrial fibrillation.  Fortunately, with medication changes this has not happened recently.  She currently feels well, though she is bedridden.  She had skin surgery and after her operation had multiple falls.  She recovered in bed, and has since been unable to ambulate.  She does not have any shortness of breath.  She has been having some chest pain that lasts up to 1 second that is very much intermittent.  Today, denies symptoms of palpitations, chest pain, shortness of breath, orthopnea, PND, lower extremity edema, claudication, dizziness,  presyncope, syncope, bleeding, or neurologic sequela. The patient is tolerating medications without difficulties.    Past Medical History:  Diagnosis Date  . A-fib ([place])   . Anemia   . Arthritis   . Basal cell carcinoma    nose  . Diabetes mellitus without complication ([place])    pt denies diabetes  . GERD (gastroesophageal reflux disease)   . Hiatal hernia   . HLD (hyperlipidemia)   . Hypertension   . Seizure ([place]) 2011    Past Surgical History:  Procedure Laterality Date  . CARDIAC DEFIBRILLATOR PLACEMENT    . CARDIAC DEFIBRILLATOR PLACEMENT    . CATARACT EXTRACTION Left 02/2001  . CHOLECYSTECTOMY    . COLONOSCOPY WITH PROPOFOL N/A 07/19/2017   Procedure: COLONOSCOPY WITH PROPOFOL;  Surgeon: Jerene Bears, MD;  Location: [facility];  Service: Gastroenterology;  Laterality: N/A;  . ESOPHAGOGASTRODUODENOSCOPY (EGD) WITH PROPOFOL N/A 07/19/2017   Procedure: ESOPHAGOGASTRODUODENOSCOPY (EGD) WITH PROPOFOL;  Surgeon: Jerene Bears, MD;  [facility];  Service: Gastroenterology;  Laterality: N/A;  . LUMBAR LAMINECTOMY  06/11/2001  . LUMBAR LAMINECTOMY  02/09/2006  . LUMBAR LAMINECTOMY  07/07/2006   with spianal cord stimulator  . SPINAL CORD STIMULATOR REMOVAL  14/2009  . TUBAL LIGATION      Current Outpatient Medications  Medication Sig Dispense Refill  . acetaminophen (TYLENOL) 500 MG tablet Take 500-1,000 mg by mouth every 6 (six) hours as needed for mild pain.    Marland Kitchen atorvastatin (LIPITOR) 40 MG tablet Take 40 mg by mouth daily.    . ciprofloxacin (CIPRO) 500 MG tablet Take 1 tablet (500 mg total) by mouth  2 (two) times daily for 10 days. 20 tablet 0  . ferrous sulfate 325 (65 FE) MG tablet Take 325 mg by mouth every Monday, Wednesday, and Friday.     . furosemide (LASIX) 20 MG tablet Take 20 mg by mouth daily as needed for fluid.    . isosorbide mononitrate (IMDUR) 30 MG 24 hr tablet Take 1 tablet (30 mg total) by mouth daily. 90 tablet 3  . metoprolol tartrate  (LOPRESSOR) 100 MG tablet Take 1 tablet (100 mg total) by mouth 2 (two) times daily. 60 tablet 0  . mirtazapine (REMERON) 7.5 MG tablet Take 1 tablet (7.5 mg total) by mouth at bedtime. 30 tablet 1  . omeprazole (PRILOSEC) 40 MG capsule Take 1 capsule (40 mg total) by mouth 2 (two) times daily. Take before breakfast and dinner. 180 capsule 3  . Propylene Glycol (SYSTANE COMPLETE) 0.6 % SOLN Place 1 drop into both eyes daily.    . Rivaroxaban (XARELTO) 15 MG TABS tablet Take 15 mg by mouth daily with supper.    . sucralfate (CARAFATE) 1 GM/10ML suspension Take 1 g by mouth every 6 (six) hours as needed.    . tolterodine (DETROL LA) 2 MG 24 hr capsule Take 2 mg by mouth daily.    . verapamil (CALAN-SR) 240 MG CR tablet Take 1 tablet (240 mg total) by mouth at bedtime. 90 tablet 2  . VITAMIN D PO Take 1 capsule by mouth daily.     No current facility-administered medications for this visit.    Allergies:   Sotalol, Erythromycin, Augmentin [amoxicillin-pot clavulanate], Crestor [rosuvastatin calcium], Keflex [cephalexin], and Septra [sulfamethoxazole-trimethoprim]   Social History:  The patient  reports that she has never smoked. She has never used smokeless tobacco. She reports that she does not drink alcohol or use drugs.   Family History:  The patient's  family history includes Breast cancer in her sister; Diabetes in her mother, sister, and son; Heart failure in her father and sister.   ROS:  Please see the history of present illness.   All other systems are personally reviewed and negative.    Exam:    Vital Signs:  There were no vitals taken for this visit.  Ill and weak appearing, alert and conversant, regular work of breathing,  good skin color Eyes- anicteric, neuro- grossly intact, skin- no apparent rash or lesions or cyanosis, mouth- oral mucosa is pink  Labs/Other Tests and Data Reviewed:    Recent Labs: 01/01/2019: ALT 16; B Natriuretic Peptide 229.4 01/15/2019: Magnesium  1.9; TSH 4.915 01/16/2019: BUN 27; Creatinine, Ser 1.18; Potassium 4.0; Sodium 136 01/24/2019: Hemoglobin 8.8; Platelets 313   Wt Readings from Last 3 Encounters:  04/25/19 120 lb (54.4 kg)  04/11/19 125 lb (56.7 kg)  01/17/19 137 lb (62.1 kg)     Other studies personally reviewed: Additional studies/ records that were reviewed today include: ECG 02/07/2019 personally reviewed Review of the above records today demonstrates: Sinus rhythm, rate 72   Last device remote is reviewed from [place] PDF dated 02/07/19 which reveals normal device function, SVT   ASSESSMENT & PLAN:    1.  Paroxysmal atrial fibrillation: Currently on Xarelto and metoprolol.  CHA2DS2-VASc of 5.  Has remained in sinus rhythm.  2.  Ventricular tachycardia: Status post [place].  Device functioning appropriately on recent remote checks.  I had a long discussion with both the patient and her daughter about goals of care and ICD therapy.  The patient is currently bedbound.  We discussed turning off tacky therapies from her defibrillator.  At this point, she would like to have tachycardia therapies turned off.  We did discuss what would happen if she went into ventricular tachycardia, that this would be likely a life-threatening arrhythmia.  The patient understands this and continues to wish for tachy therapies to be turned off.  3.  Hypertension: Has not recently been checked.  COVID 19 screen The patient denies symptoms of COVID 19 at this time.  The importance of social distancing was discussed today.  Follow-up:  6 months  Current medicines are reviewed at length with the patient today.   The patient does not have concerns regarding her medicines.  The following changes were made today:  none  Labs/ tests ordered today include:  No orders of the defined types were placed in this encounter.    Patient Risk:  after full review of this patients clinical status, I feel that they are at moderate risk  at this time.  Today, I have spent 12 minutes with the patient with telehealth technology discussing AF .    Signed, Anely Spiewak Meredith Leeds, MD  04/30/2019 3:19 PM     [facility] [address] [place] [address] [phone] (office) [phone] (fax)

## 2019-04-30 NOTE — Telephone Encounter (Signed)
Per patient's appt today with Dr. Curt Bears, patient's daughter states that they are going to postpone turning her mothers pacemaker off and will have a family discussion to discuss the next steps.

## 2019-04-30 NOTE — Patient Instructions (Signed)
Licensed Clinical Social Worker Visit Information Ms. Duffany  it was nice speaking with your daughter. Please call me directly if you have questions [phone] Goals we discussed today:  Goals Addressed            This Visit's Progress   . possible placement       Current Barriers:   . Care Coordination Needs related to facility placement for long term skilled nursing facility  in patient with HTN, DMII, and History of CVA.   Marland Kitchen Patient's health has declined and no longer able to meet ADL's . Patient currently has nurse care 8 hours a day when daughter is at work   Clinical Social Work Goal(s): No goal set at this time for placement as family is gathering information in order  Interventions completed by LCSW: . Assessed needs and provided education on level of care and facility placement process . Will mail packet of information to daughter ( list of facilities, and understanding the placement process) . LCSW will be available to answer questions as needed. Patient Self Care Activities & Deficits:  . Patient is unable to perform ADL's,  . Patient has [place] nurse paid from her long-term care insurance   Initial goal documentation      Materials provided: Yes: understanding the placement process and list of facilities Ms. Fayard was given information about Care Management services today including:  1. Care Management services include personalized support from designated clinical staff supervised by her physician, including individualized plan of care and coordination with other care providers 2. 24/7 contact [phone] for assistance for urgent and routine care needs. 3. Care Management services at any time by phone call to the office staff.   Patient's daughter agreed to services provided for today only.  The patient verbalized understanding of instructions provided today and declined a print copy of patient instruction materials.  Follow up plan: no follow up  required  Maurine Cane, LCSW

## 2019-05-02 NOTE — Telephone Encounter (Signed)
Left detailed message for dtr informing her that we were aware of their updated decision.  Advised to call if/when they would like to re-discuss turning ICD off. Also informed that device clinic was made aware we are not turning off ICD therapy at this time.

## 2019-05-03 NOTE — Progress Notes (Signed)
Patient placed on the geri clinic wait list for a home visit.  We are currently unable to do this until April unless a full day of clinic opens up for [place].  Please review CCM notes on concerns from family.  Allexis Bordenave,CMA

## 2019-05-07 ENCOUNTER — Telehealth: Payer: Self-pay

## 2019-05-07 NOTE — Telephone Encounter (Signed)
Pt's daughter calls nurse line regarding blood in urine. Daughter reports patient seen in office for UTI on 1/28, received Cipro antibiotic. Daughter states that pt began having blood in her urine this past Friday (2/5). Vital signs have been normal for patient, no dizziness, or change in mental status. Reports slight nausea intermittently. Daughter says that she cannot bring mother into office, virtual appointment scheduled for tomorrow afternoon with Dr. Kris Mouton. Daughter to bring urine sample tomorrow afternoon to office. Please place orders for urine sample.   Talbot Grumbling, RN

## 2019-05-08 ENCOUNTER — Other Ambulatory Visit: Payer: Self-pay

## 2019-05-08 ENCOUNTER — Telehealth (INDEPENDENT_AMBULATORY_CARE_PROVIDER_SITE_OTHER): Payer: Medicare Other | Admitting: Family Medicine

## 2019-05-08 DIAGNOSIS — R300 Dysuria: Secondary | ICD-10-CM | POA: Diagnosis not present

## 2019-05-08 DIAGNOSIS — R319 Hematuria, unspecified: Secondary | ICD-10-CM

## 2019-05-08 LAB — POCT URINALYSIS DIP (MANUAL ENTRY)
Glucose, UA: NEGATIVE mg/dL
Ketones, POC UA: NEGATIVE mg/dL
Nitrite, UA: NEGATIVE
Protein Ur, POC: >=300 mg/dL — AB
Spec Grav, UA: 1.02 (ref 1.010–1.025)
Urobilinogen, UA: 0.2 E.U./dL
pH, UA: 6 (ref 5.0–8.0)

## 2019-05-08 MED ORDER — OMEPRAZOLE 40 MG PO CPDR: 40.0000 mg | DELAYED_RELEASE_CAPSULE | Freq: Two times a day (BID) | ORAL | 0 refills | Status: DC

## 2019-05-08 NOTE — Progress Notes (Signed)
Received fax request for refill of omeprazole. Patient last seen 06-2018.

## 2019-05-09 ENCOUNTER — Ambulatory Visit (INDEPENDENT_AMBULATORY_CARE_PROVIDER_SITE_OTHER): Payer: Medicare Other | Admitting: *Deleted

## 2019-05-09 DIAGNOSIS — I482 Chronic atrial fibrillation, unspecified: Secondary | ICD-10-CM | POA: Diagnosis not present

## 2019-05-09 LAB — CUP PACEART REMOTE DEVICE CHECK
Battery Remaining Longevity: 24 mo
Battery Remaining Percentage: 30 %
Brady Statistic RA Percent Paced: 74 %
Brady Statistic RV Percent Paced: 0 %
Date Time Interrogation Session: 20210211043700
HighPow Impedance: 40 Ohm
Implantable Lead Implant Date: 20111110
Implantable Lead Implant Date: 20111110
Implantable Lead Location: 753859
Implantable Lead Location: 753860
Implantable Lead Model: 157
Implantable Lead Model: 4135
Implantable Lead Serial Number: 28803817
Implantable Lead Serial Number: 302861
Implantable Pulse Generator Implant Date: 20111110
Lead Channel Impedance Value: 430 Ohm
Lead Channel Impedance Value: 617 Ohm
Lead Channel Pacing Threshold Amplitude: 0.3 V
Lead Channel Pacing Threshold Amplitude: 0.8 V
Lead Channel Pacing Threshold Pulse Width: 0.4 ms
Lead Channel Pacing Threshold Pulse Width: 0.4 ms
Lead Channel Setting Pacing Amplitude: 2 V
Lead Channel Setting Pacing Amplitude: 2.4 V
Lead Channel Setting Pacing Pulse Width: 0.4 ms
Lead Channel Setting Sensing Sensitivity: 0.6 mV
Pulse Gen Serial Number: 169112

## 2019-05-09 NOTE — Progress Notes (Signed)
ICD Remote  

## 2019-05-10 ENCOUNTER — Other Ambulatory Visit: Payer: Self-pay | Admitting: Family Medicine

## 2019-05-10 ENCOUNTER — Telehealth: Payer: Self-pay | Admitting: *Deleted

## 2019-05-10 DIAGNOSIS — R319 Hematuria, unspecified: Secondary | ICD-10-CM | POA: Insufficient documentation

## 2019-05-10 LAB — URINE CULTURE

## 2019-05-10 MED ORDER — PHENAZOPYRIDINE HCL 200 MG PO TABS: 200.0000 mg | ORAL_TABLET | Freq: Three times a day (TID) | ORAL | 0 refills | Status: AC | PRN

## 2019-05-10 NOTE — Assessment & Plan Note (Addendum)
Unclear etiology. Does not appear to be from UTI despite the symptoms of dysuria and hematuria given findings of mixed flora although I do suspect this is a dirty sample again. Can try pyridium to help with symptoms of dysuria. Will proceed with CT scan of abdomen and pelvis to assess for possible oncologic cause of hematuria. Could consider urology referral pending results.

## 2019-05-10 NOTE — Progress Notes (Signed)
[facility] Telemedicine Visit  Patient consented to have virtual visit. Method of visit: Telephone  Encounter participants: Patient: Elvine Lauinger - located at home Provider: Guadalupe Dawn - located at [facility] Others (if applicable): patient's daughter at home  Chief Complaint:  Hematuria  HPI: 86 year old female who presents as a telemedicine visit for hematuria. Per her daughter's report she has been having hematuria since her clinic visit on 04/25/2019. She had a UA consistent with possible UTI and was given ciprofloxacin at this appointment. This med was prescribed because of multiple allergies to other commonly prescribed uti meds. She was having symptoms of dysuria and frequency of urination at the time. Her urine culture came back with 75-100 CFU but mixed growth. She did not improve with the cipro. She is still having symptoms of hematuria, dysuria, and frequency.  Of note the patient has had numerous cultures performed in the past and has only had one culture result with a treatable pathogen. E. Faecalis and E. Coli were grown out back in 2019 and these were essentially pan-sensitive.   ROS: per HPI  Pertinent PMHx: none  Exam:  Per daughter's report General:  No acute distress,  Resting comfortably Resp: no accessory muscle use Cards: skin warm and dry  Assessment/Plan:  Hematuria Unclear etiology. Does not appear to be from UTI despite the symptoms of dysuria and hematuria given findings of mixed flora although I do suspect this is a dirty sample again. Can try pyridium to help with symptoms of dysuria. Will proceed with CT scan of abdomen and pelvis to assess for possible oncologic cause of hematuria. Could consider urology referral pending results.    Time spent during visit with patient: 12 minutes

## 2019-05-10 NOTE — Telephone Encounter (Signed)
Discussed results with patient's daughter. Please see accompanying telephone visit.  Guadalupe Dawn MD PGY-3 Family Medicine Resident

## 2019-05-10 NOTE — Telephone Encounter (Signed)
Pt daughter calling in to get the results of her moms urine. Please call her back. Adianna Darwin Kennon Holter, CMA

## 2019-05-10 NOTE — Progress Notes (Signed)
Discussed plan of care with the family. I don't believe there is evidence of a urinary tract infection at this point given the two negative cultures, lack of improvement on antibiotics, and lack of improvement. Will send in pyridium for the discomfort. In order to further evaluation the etiology of her hematuria and proteinuria will opt to get a CT abdomen and pelvis w and w/o contrast. Scheduled this over at [facility] on 2/17 @ 11am. Will ask for chronic care management to assist with transport. Placed referral for this as urgent. Will await results with anticipation.  Guadalupe Dawn MD PGY-3 Family Medicine Resident

## 2019-05-13 ENCOUNTER — Telehealth: Payer: Self-pay

## 2019-05-13 NOTE — Telephone Encounter (Signed)
05/13/2019 Spoke with Ebony Hail in [place] she is scheduling transportation for Ms. Brenda [name] for 05/15/2019 @11am .  Ambrose Mantle [phone]

## 2019-05-13 NOTE — Telephone Encounter (Signed)
05/13/2019 Spoke with Janett Billow in [facility] to get  cost center transfer number, left voicemail message and emailed transit request to [employer].  Ambrose Mantle [phone]

## 2019-05-14 ENCOUNTER — Telehealth: Payer: Self-pay

## 2019-05-14 NOTE — Telephone Encounter (Signed)
05/14/2019 Spoke with Debbra Riding in the [place] Dept. let her know the patient would be arriving by [facility] transportation. Emailed transportation ride waiver form to [facility] for the patient to sign tomorrow morning. Sherri will print the form for the patient to sign. Updated [place] transportation that waiver form will be at the desk. Ambrose Mantle [phone]

## 2019-05-14 NOTE — Telephone Encounter (Signed)
I appreciate this wonderful support for our patients.  Is there anything I need to do at this point?  Domenic Schwab

## 2019-05-15 ENCOUNTER — Other Ambulatory Visit: Payer: Self-pay

## 2019-05-15 ENCOUNTER — Other Ambulatory Visit: Payer: Self-pay | Admitting: Family Medicine

## 2019-05-15 ENCOUNTER — Ambulatory Visit (HOSPITAL_COMMUNITY)
Admission: RE | Admit: 2019-05-15 | Discharge: 2019-05-15 | Disposition: A | Discharge status: Home / Self Care | Payer: Medicare Other | Source: Ambulatory Visit | Attending: Family Medicine | Admitting: Family Medicine

## 2019-05-15 DIAGNOSIS — R319 Hematuria, unspecified: Secondary | ICD-10-CM

## 2019-05-15 DIAGNOSIS — E119 Type 2 diabetes mellitus without complications: Secondary | ICD-10-CM

## 2019-05-15 LAB — POCT I-STAT CREATININE: Creatinine, Ser: 1.2 mg/dL — ABNORMAL HIGH (ref 0.44–1.00)

## 2019-05-15 MED ORDER — IOHEXOL 300 MG/ML  SOLN
75.0000 mL | Freq: Once | INTRAMUSCULAR | Status: AC | PRN
  Administered 2019-05-15: 12:00:00 75 mL via INTRAVENOUS

## 2019-05-15 MED ORDER — SODIUM CHLORIDE (PF) 0.9 % IJ SOLN
INTRAMUSCULAR | Status: AC
  Filled 2019-05-15: qty 50

## 2019-05-17 ENCOUNTER — Telehealth: Payer: Self-pay | Admitting: Family Medicine

## 2019-05-17 DIAGNOSIS — N3289 Other specified disorders of bladder: Secondary | ICD-10-CM

## 2019-05-17 NOTE — Telephone Encounter (Signed)
Discussed results of ct scan with patient's daughter. Showed profound bladder distention and severe bilateral hydronephrosis. Likely 2/2 to the bleeding. The patient often complains of being unable to properly empty her bladder during urination. I believe this is likely due to patient's advanced age. I explained that it would be a good idea for urological evaluation given that she might benefit from additional workup and possible foley catheter placement. If there is some derangement in the stretch receptors associated with the bladder distention then urological rehab is a good idea as well. Also discussed possibility of organ prolapse causing this issue although not seen on ct scan and never described before in the clinic notes. Would defer need of additional workup to urology. Patient's daughter is in agreement and understands the plan.  Guadalupe Dawn MD PGY-3 Family Medicine Resident

## 2019-06-24 ENCOUNTER — Other Ambulatory Visit: Payer: Self-pay | Admitting: Family Medicine

## 2019-06-24 DIAGNOSIS — R630 Anorexia: Secondary | ICD-10-CM

## 2019-08-08 ENCOUNTER — Ambulatory Visit (INDEPENDENT_AMBULATORY_CARE_PROVIDER_SITE_OTHER): Payer: Medicare Other | Admitting: *Deleted

## 2019-08-08 DIAGNOSIS — I472 Ventricular tachycardia, unspecified: Secondary | ICD-10-CM

## 2019-08-08 LAB — CUP PACEART REMOTE DEVICE CHECK
Battery Remaining Longevity: 18 mo
Battery Remaining Percentage: 24 %
Brady Statistic RA Percent Paced: 74 %
Brady Statistic RV Percent Paced: 0 %
Date Time Interrogation Session: 20210513094900
HighPow Impedance: 45 Ohm
Implantable Lead Implant Date: 20111110
Implantable Lead Implant Date: 20111110
Implantable Lead Location: 753859
Implantable Lead Location: 753860
Implantable Lead Model: 157
Implantable Lead Model: 4135
Implantable Lead Serial Number: 28803817
Implantable Lead Serial Number: 302861
Implantable Pulse Generator Implant Date: 20111110
Lead Channel Impedance Value: 458 Ohm
Lead Channel Impedance Value: 696 Ohm
Lead Channel Pacing Threshold Amplitude: 0.3 V
Lead Channel Pacing Threshold Amplitude: 0.8 V
Lead Channel Pacing Threshold Pulse Width: 0.4 ms
Lead Channel Pacing Threshold Pulse Width: 0.4 ms
Lead Channel Setting Pacing Amplitude: 2 V
Lead Channel Setting Pacing Amplitude: 2.4 V
Lead Channel Setting Pacing Pulse Width: 0.4 ms
Lead Channel Setting Sensing Sensitivity: 0.6 mV
Pulse Gen Serial Number: 169112

## 2019-08-12 NOTE — Progress Notes (Signed)
Remote ICD transmission.   

## 2019-08-16 ENCOUNTER — Other Ambulatory Visit: Payer: Self-pay | Admitting: *Deleted

## 2019-08-16 ENCOUNTER — Other Ambulatory Visit: Payer: Self-pay | Admitting: Family Medicine

## 2019-08-25 ENCOUNTER — Other Ambulatory Visit: Payer: Self-pay | Admitting: Cardiology

## 2019-09-28 ENCOUNTER — Other Ambulatory Visit: Payer: Self-pay | Admitting: Cardiology

## 2019-10-01 NOTE — Telephone Encounter (Signed)
Prescription refill request for Xarelto received.   Last office visit: 04/30/2019, [place] Weight: 54.4 kg  Age: 86 y.o. Scr: 1.18, 01/16/2019 CrCl: 29.93 ml/min   Prescription refill sent.

## 2019-10-02 ENCOUNTER — Other Ambulatory Visit: Payer: Self-pay | Admitting: Family Medicine

## 2019-10-02 DIAGNOSIS — R630 Anorexia: Secondary | ICD-10-CM

## 2019-10-26 ENCOUNTER — Other Ambulatory Visit: Payer: Self-pay | Admitting: Cardiology

## 2019-10-30 ENCOUNTER — Other Ambulatory Visit: Payer: Self-pay | Admitting: Family Medicine

## 2019-11-05 ENCOUNTER — Ambulatory Visit: Payer: Medicare Other | Admitting: Licensed Clinical Social Worker

## 2019-11-05 DIAGNOSIS — Z7189 Other specified counseling: Secondary | ICD-10-CM

## 2019-11-05 NOTE — Chronic Care Management (AMB) (Signed)
Care Management   Clinical Social Work Follow Up   11/05/2019 Name: Brenda [name] MRN: [id number] DOB: [birth year] Referred by: Brenda Pike, MD  Reason for referral : Care Coordination [facility])  Brenda [name] is a 86 y.o. year old female who is a primary care patient of Brenda Pike, MD.  Reason for follow-up: Phone call from [facility] requesting [facility] for respite care.    Plan: LCSW  1. Needs update from PCP.  Message sent 11/05/19 2.  Waiting on phone call from daughter to request FL2 Advance Directive Status: not addressed during this encounter.  SDOH (Social Determinants of Health) assessments performed: No ; No needs identified   Goals Addressed            This Visit's Progress   . possible placement       Current Barriers:   . Care Coordination Needs related to facility placement for long term skilled nursing facility  in patient with HTN, DMII, and History of CVA.   Marland Kitchen Patient's health has declined and no longer able to meet ADL's . Patient currently has nurse care 8 hours a day when daughter is at work  . Call from [facility] FL[facility] for respite placement Clinical Social Work Goal(s): No goal set at this time for placement as family is gathering information in order  Interventions completed by LCSW: . Assessed needs and provided education on level of care and facility placement process . Will mail packet of information to daughter ( list of facilities, and understanding the placement process) . LCSW will be available to answer questions as needed. [place] patient's daughter will need to call to make request . Collaborated with PCP for placement needs and FL2 Patient Self Care Activities & Deficits:  . Patient is unable to perform ADL's,  . Patient has [name] nurse paid from her long-term care insurance  Initial goal documentation and Please see past updates related to this goal by clicking on the "Past Updates" button in the selected goal         Outpatient Encounter Medications as of 11/05/2019  Medication Sig  . omeprazole (PRILOSEC) 40 MG capsule TAKE 1 CAPSULE(40 MG) BY MOUTH TWICE DAILY  . acetaminophen (TYLENOL) 500 MG tablet Take 500-1,000 mg by mouth every 6 (six) hours as needed for mild pain.  Marland Kitchen atorvastatin (LIPITOR) 40 MG tablet Take 40 mg by mouth daily.  . ferrous sulfate 325 (65 FE) MG tablet Take 325 mg by mouth every Monday, Wednesday, and Friday.   . furosemide (LASIX) 20 MG tablet Take 20 mg by mouth daily as needed for fluid.  . isosorbide mononitrate (IMDUR) 30 MG 24 hr tablet Take 1 tablet (30 mg total) by mouth daily.  . metoprolol tartrate (LOPRESSOR) 100 MG tablet Take 1 tablet (100 mg total) by mouth 2 (two) times daily.  . mirtazapine (REMERON) 7.5 MG tablet TAKE ONE TABLET BY MOUTH AT BEDTIME  . phenazopyridine (PYRIDIUM) 200 MG tablet Take 1 tablet (200 mg total) by mouth 3 (three) times daily as needed for pain.  Marland Kitchen Propylene Glycol (SYSTANE COMPLETE) 0.6 % SOLN Place 1 drop into both eyes daily.  . sucralfate (CARAFATE) 1 GM/10ML suspension Take 1 g by mouth every 6 (six) hours as needed.  . tolterodine (DETROL LA) 2 MG 24 hr capsule Take 2 mg by mouth daily.  . verapamil (CALAN-SR) 240 MG CR tablet TAKE 1 TABLET AT BEDTIME  . VITAMIN D PO Take 1 capsule by mouth  daily.  Alveda Reasons 15 MG TABS tablet TAKE 1 TABLET DAILY WITH SUPPER   No facility-administered encounter medications on file as of 11/05/2019.   Review of patient status, including review of consultants reports, relevant laboratory and other test results, and collaboration with appropriate care team members and the patient's provider was performed as part of comprehensive patient evaluation and provision of care management services.    Casimer Lanius, [place] / [place]   [phone] 10:59 AM

## 2019-11-07 ENCOUNTER — Ambulatory Visit (INDEPENDENT_AMBULATORY_CARE_PROVIDER_SITE_OTHER): Payer: Medicare Other | Admitting: *Deleted

## 2019-11-07 DIAGNOSIS — I472 Ventricular tachycardia, unspecified: Secondary | ICD-10-CM

## 2019-11-08 ENCOUNTER — Ambulatory Visit: Payer: Medicare Other | Admitting: Licensed Clinical Social Worker

## 2019-11-08 DIAGNOSIS — Z719 Counseling, unspecified: Secondary | ICD-10-CM

## 2019-11-08 DIAGNOSIS — Z7189 Other specified counseling: Secondary | ICD-10-CM

## 2019-11-08 NOTE — Chronic Care Management (AMB) (Signed)
   Clinical Social Work  Care Management referral   11/08/2019 Name: Kela Baccari MRN: [id number] DOB: 1931/07/01  Brenda [name] is a 86 y.o. year old female who is a primary care patient of Benay Pike, MD .  F/U to Tyniya Kuyper daughter to inquire about the call received from [facility] requesting an [facility] for patient.  Daughter upset that she did not get the [facility] felt like she was given the run around. With [facility] and [facility]. She no longer needs the respite services for patient.  Intervention: .   Assessed needs and barriers   . Provided patient's daughter with information about process for requesting an FL2 . Other interventions include:Motivational Interviewing  Plan: Daughter will call office for PCP to complet if FL2 is needed in the future   Outpatient Encounter Medications as of 11/08/2019  Medication Sig  . omeprazole (PRILOSEC) 40 MG capsule TAKE 1 CAPSULE(40 MG) BY MOUTH TWICE DAILY  . acetaminophen (TYLENOL) 500 MG tablet Take 500-1,000 mg by mouth every 6 (six) hours as needed for mild pain.  Marland Kitchen atorvastatin (LIPITOR) 40 MG tablet Take 40 mg by mouth daily.  . ferrous sulfate 325 (65 FE) MG tablet Take 325 mg by mouth every Monday, Wednesday, and Friday.   . furosemide (LASIX) 20 MG tablet Take 20 mg by mouth daily as needed for fluid.  . isosorbide mononitrate (IMDUR) 30 MG 24 hr tablet Take 1 tablet (30 mg total) by mouth daily.  . metoprolol tartrate (LOPRESSOR) 100 MG tablet Take 1 tablet (100 mg total) by mouth 2 (two) times daily.  . mirtazapine (REMERON) 7.5 MG tablet TAKE ONE TABLET BY MOUTH AT BEDTIME  . phenazopyridine (PYRIDIUM) 200 MG tablet Take 1 tablet (200 mg total) by mouth 3 (three) times daily as needed for pain.  Marland Kitchen Propylene Glycol (SYSTANE COMPLETE) 0.6 % SOLN Place 1 drop into both eyes daily.  . sucralfate (CARAFATE) 1 GM/10ML suspension Take 1 g by mouth every 6 (six) hours as needed.  . tolterodine (DETROL LA) 2 MG 24 hr capsule Take 2  mg by mouth daily.  . verapamil (CALAN-SR) 240 MG CR tablet TAKE 1 TABLET AT BEDTIME  . VITAMIN D PO Take 1 capsule by mouth daily.  Alveda Reasons 15 MG TABS tablet TAKE 1 TABLET DAILY WITH SUPPER   No facility-administered encounter medications on file as of 11/08/2019.   Review of patient status, including review of consultants reports, relevant laboratory and other test results, and collaboration with appropriate care team members and the patient's provider was performed as part of comprehensive patient evaluation and provision of care management services.    Casimer Lanius, [place] / [place]   [phone] 4:30 PM

## 2019-11-10 LAB — CUP PACEART REMOTE DEVICE CHECK
Battery Remaining Longevity: 18 mo
Battery Remaining Percentage: 23 %
Brady Statistic RA Percent Paced: 75 %
Brady Statistic RV Percent Paced: 0 %
Date Time Interrogation Session: 20210813123000
HighPow Impedance: 44 Ohm
Implantable Lead Implant Date: 20111110
Implantable Lead Implant Date: 20111110
Implantable Lead Location: 753859
Implantable Lead Location: 753860
Implantable Lead Model: 157
Implantable Lead Model: 4135
Implantable Lead Serial Number: 28803817
Implantable Lead Serial Number: 302861
Implantable Pulse Generator Implant Date: 20111110
Lead Channel Impedance Value: 438 Ohm
Lead Channel Impedance Value: 691 Ohm
Lead Channel Pacing Threshold Amplitude: 0.3 V
Lead Channel Pacing Threshold Amplitude: 0.8 V
Lead Channel Pacing Threshold Pulse Width: 0.4 ms
Lead Channel Pacing Threshold Pulse Width: 0.4 ms
Lead Channel Setting Pacing Amplitude: 2 V
Lead Channel Setting Pacing Amplitude: 2.4 V
Lead Channel Setting Pacing Pulse Width: 0.4 ms
Lead Channel Setting Sensing Sensitivity: 0.6 mV
Pulse Gen Serial Number: 169112

## 2019-11-11 ENCOUNTER — Other Ambulatory Visit: Payer: Self-pay | Admitting: Family Medicine

## 2019-11-11 NOTE — Progress Notes (Signed)
Remote ICD transmission.   

## 2019-11-13 NOTE — Telephone Encounter (Signed)
Can we call this pt regarding this medication? It says previously that they wanted her to switch to [facility], and daughter stated they will try to switch if insurance covers it.  I would like to know if they followed up on switching the medications before I refill it.

## 2019-11-15 NOTE — Telephone Encounter (Signed)
Has anyone reached out to the pt yet regarding this medication?

## 2019-11-15 NOTE — Telephone Encounter (Signed)
LVm to call office to ask about below.Brenda [name], CMA

## 2019-11-18 NOTE — Telephone Encounter (Signed)
Patients daughter is returning April's phone call. She would like her to call her back at [phone].

## 2019-11-18 NOTE — Telephone Encounter (Signed)
Spoke to pts daughter and she said that yes they did check and it was going to be too expensive and that the tricare did not cover it.  She would like to have it filled as soon as possible to [facility] AND also to the [facility] on [address] since her mom is completely out and it takes the mail order a while to get medication to them.  Bernice Mcauliffe Zimmerman Rumple, CMA

## 2019-11-19 ENCOUNTER — Telehealth: Payer: Self-pay

## 2019-11-19 MED ORDER — RIVAROXABAN 15 MG PO TABS: ORAL_TABLET | ORAL | 0 refills | Status: DC

## 2019-11-19 NOTE — Telephone Encounter (Signed)
[facility] phone number is [phone].

## 2019-11-19 NOTE — Telephone Encounter (Signed)
Returned call to dtr. States pt has one pill for tonight. Aware I will send in 30 day supply to local pharmacy, per request. dtr appreciates help with this.

## 2019-11-19 NOTE — Telephone Encounter (Signed)
The pt daughter Freada Bergeron left a message on voicemail, asking for the pt prescription of Xarelto 50 mg refilled to [facility] on [address]. The pt is on her last pill.

## 2019-11-20 NOTE — Telephone Encounter (Signed)
[name] states she was able to find another bottle of the Xarelto.

## 2019-11-21 ENCOUNTER — Other Ambulatory Visit: Payer: Self-pay | Admitting: Family Medicine

## 2019-11-21 MED ORDER — TOLTERODINE TARTRATE ER 2 MG PO CP24: 2.0000 mg | ORAL_CAPSULE | Freq: Every day | ORAL | 0 refills | Status: DC

## 2019-12-24 ENCOUNTER — Other Ambulatory Visit: Payer: Self-pay | Admitting: Family Medicine

## 2019-12-24 DIAGNOSIS — R630 Anorexia: Secondary | ICD-10-CM

## 2020-01-21 ENCOUNTER — Other Ambulatory Visit: Payer: Self-pay | Admitting: Cardiology

## 2020-01-28 ENCOUNTER — Other Ambulatory Visit: Payer: Self-pay | Admitting: Family Medicine

## 2020-01-29 ENCOUNTER — Telehealth: Payer: Self-pay | Admitting: Cardiology

## 2020-01-29 NOTE — Telephone Encounter (Addendum)
Dtr reports that [facility] told her pt has "anterior left side congestion". Pt c/o of pain down her left side. Denies CP, SOB or weight gain. Appetite decreasing. Informed that I think she will need to see PCP first about concerns, but will review w/ Dr. Curt Bears first. Informed dtr that I would also reach out to [facility] RN to discuss her findings. Aware I will follow up after speaking w/ MD and HH. dtr is agreeable to plan   Nurse Rodena Piety from [facility] [phone]

## 2020-01-29 NOTE — Telephone Encounter (Signed)
Patients daughter called and wanted to get the patient seen asap due to fluid around the heart. Please call

## 2020-01-30 NOTE — Telephone Encounter (Signed)
Spoke to [facility] agency.   Left message for Rodena Piety to return call to clarify what she reported to the family yesterday.

## 2020-02-03 NOTE — Telephone Encounter (Signed)
HHRN returned call.   She reports that pt had "upper left lobe congestion" and that it was in her lungs. She also reports that pts 02 sats, when checked, were low 80s, she had pt intake some deep breaths and it immediately went up to 99%.  She told family that pt needed to see PCP to further evaluate. Informed RN I appreciate her call back.

## 2020-02-03 NOTE — Telephone Encounter (Signed)
Reviewed w/ [place] Advised [name] to have pt follow up w/ PCP first, aware if they feel cardiology needs to see they can refer. Dtr verbalized understanding and agreeable to plan.

## 2020-02-03 NOTE — Telephone Encounter (Signed)
                                       Left message at [facility] agency again

## 2020-02-05 ENCOUNTER — Other Ambulatory Visit: Payer: Self-pay

## 2020-02-05 ENCOUNTER — Ambulatory Visit (INDEPENDENT_AMBULATORY_CARE_PROVIDER_SITE_OTHER): Payer: Medicare Other | Admitting: Family Medicine

## 2020-02-05 VITALS — BP 148/78 | HR 75 | Resp 20

## 2020-02-05 DIAGNOSIS — M25512 Pain in left shoulder: Secondary | ICD-10-CM | POA: Diagnosis not present

## 2020-02-05 DIAGNOSIS — Z23 Encounter for immunization: Secondary | ICD-10-CM | POA: Diagnosis not present

## 2020-02-05 DIAGNOSIS — Z7401 Bed confinement status: Secondary | ICD-10-CM

## 2020-02-05 DIAGNOSIS — R0602 Shortness of breath: Secondary | ICD-10-CM | POA: Diagnosis not present

## 2020-02-05 NOTE — Progress Notes (Signed)
SUBJECTIVE:   CHIEF COMPLAINT / HPI:   Shortness of breath Brenda [name] was brought in by her daughter who contributed to the medical history.  Her daughter reports that a home health nurse came to the house about 2 weeks ago for a routine visit and noted that she had some congestion in her lungs on the left side.  She encouraged Mrs. Kallenberger to follow-up with her primary care doctor for this pulmonary congestion.  She has not been having any fevers or pleuritic chest pain.  She she does have a mild cough without sputum production.  Her daughter notes that she spends most of her time in bed but she does seem to get winded slightly more easily than usual.  Left shoulder pain She has always favored using her right side and often leans on her left shoulder.  This is been treated for the past months maybe even years.  In the past week, she has noted some left shoulder pain.  This pain is noted primarily with movement.  She has some difficulty lifting her left arm as much as her right arm.  She cannot say if this pain is worsened with exertion because she spends all of her time in bed.  She describes her pain as a shooting pain in her left shoulder down her arm.  This happens multiple times a day and has happened twice today.  She is not taken any medicine to significantly relieve the pain.  She has not had any recent trauma or falls as she stays in bed.  Bedbound status She has been bedbound for roughly the past year.  As a result, she has naturally been much less active and appears to slowly be declining.  She does have home health nurses who routinely come to the house to help her with physical therapy.  As a result of being bedbound, transportation to clinics has become increasingly difficult, time-consuming and financially draining.  PERTINENT  PMH / PSH: History of CVA, memory change, anemia  OBJECTIVE:   BP (!) 148/78   Pulse 75   Resp 20   SpO2 95%    General: Brought in on a  stretcher by medical transport team.  She remained on the stretcher for the duration of the visit.  She was awake alert, and engaged in the conversation.  She answers questions appropriately.  Slightly hard of hearing.  No acute distress. HEENT: Moist mucous membranes.  No appreciable increase in JVD. Cardiac: Systolic murmur appreciated regular rate and rhythm.  Strong radial pulse. Respiratory: Minor rales noted in the right lower field which seem to improve on repeat exam.  Good air movement throughout no wheezing appreciated.  Breathing comfortably on room air. Extremities: No lower extremity edema.  Warm, dry. Neuro: Cranial nerves II through XII intact.  4/5 strength throughout strength exam but was symmetrical for flexion and extension of the elbows bilaterally, grip strength, dorsiflexion and plantar flexion of the feet.    ASSESSMENT/PLAN:   Shortness of breath Low suspicion for pneumonia based on history and physical exam.  Low suspicion for infectious etiology.  No evidence of fluid overload on physical exam.  More likely related to her known history of anemia, deconditioning from bedbound state and possible protein calorie malnutrition.  For now, will obtain basic labs and have her follow-up with her primary care doctor for appropriate care. -Follow-up CBC, CMP  Left shoulder pain Low suspicion for stroke based on neuro exam and presence of musculoskeletal discomfort  on physical exam.  Low suspicion for MI based on a physical exam consistent with musculoskeletal pain.  More likely muscle strain secondary to atrophy due to her bedbound state. -Continue with home health physical therapy -Tylenol as needed for discomfort  Bedbound She appears to have had much fewer visits with her primary care doctor since she progressed to a bedbound state.  Seems to be in part related to the difficulty with transportation to and from appointments.  She is encouraged to follow-up in the next several  weeks with her primary care doctor.  In the meantime, I will contact social work to see if we can provide additional resources for transportation.  Her daughter was okay discussing additional resources with social work and is expecting a call in the next week. -Placed referral to social work -Follow-up with PCP     Matilde Haymaker, MD [place]

## 2020-02-05 NOTE — Assessment & Plan Note (Signed)
Low suspicion for stroke based on neuro exam and presence of musculoskeletal discomfort on physical exam.  Low suspicion for MI based on a physical exam consistent with musculoskeletal pain.  More likely muscle strain secondary to atrophy due to her bedbound state. -Continue with home health physical therapy -Tylenol as needed for discomfort

## 2020-02-05 NOTE — Assessment & Plan Note (Signed)
Low suspicion for pneumonia based on history and physical exam.  Low suspicion for infectious etiology.  No evidence of fluid overload on physical exam.  More likely related to her known history of anemia, deconditioning from bedbound state and possible protein calorie malnutrition.  For now, will obtain basic labs and have her follow-up with her primary care doctor for appropriate care. -Follow-up CBC, CMP

## 2020-02-05 NOTE — Patient Instructions (Signed)
Arm pain: I am glad you came in so we could assess your arm pain.  I do not think that you are having a stroke and I do not think that you are having a heart attack.  I think your arm pain is related to some muscular atrophy and chronically leaning on your left side.  I would encourage you to try and do daily movement and exercise with both arms so that you can continue to keep your mobility.  Take Tylenol for any discomfort.  Lung concerns/shortness of breath: I do not think that you have pneumonia and I do not think that we need to give you medicine for fluid overload (Lasix).  There are a number of things that might be contributing to some shortness of breath recently.  I am going to get some lab work today.  Ultimately, I want you to follow-up in the next couple weeks with your primary care doctor.

## 2020-02-05 NOTE — Assessment & Plan Note (Signed)
She appears to have had much fewer visits with her primary care doctor since she progressed to a bedbound state.  Seems to be in part related to the difficulty with transportation to and from appointments.  She is encouraged to follow-up in the next several weeks with her primary care doctor.  In the meantime, I will contact social work to see if we can provide additional resources for transportation.  Her daughter was okay discussing additional resources with social work and is expecting a call in the next week. -Placed referral to social work -Follow-up with PCP

## 2020-02-05 NOTE — Progress Notes (Deleted)
    SUBJECTIVE:   CHIEF COMPLAINT / HPI:   ***  PERTINENT  PMH / PSH: ***  OBJECTIVE:   BP (!) 148/78   Pulse 75   Resp 20   SpO2 95%   ***  ASSESSMENT/PLAN:   No problem-specific Assessment & Plan notes found for this encounter.     Matilde Haymaker, MD [place]

## 2020-02-06 LAB — COMPREHENSIVE METABOLIC PANEL
ALT: 11 IU/L (ref 0–32)
AST: 18 IU/L (ref 0–40)
Albumin/Globulin Ratio: 0.9 — ABNORMAL LOW (ref 1.2–2.2)
Albumin: 3.1 g/dL — ABNORMAL LOW (ref 3.6–4.6)
Alkaline Phosphatase: 147 IU/L — ABNORMAL HIGH (ref 44–121)
BUN/Creatinine Ratio: 27 (ref 12–28)
BUN: 28 mg/dL — ABNORMAL HIGH (ref 8–27)
Bilirubin Total: 0.4 mg/dL (ref 0.0–1.2)
CO2: 24 mmol/L (ref 20–29)
Calcium: 8.5 mg/dL — ABNORMAL LOW (ref 8.7–10.3)
Chloride: 105 mmol/L (ref 96–106)
Creatinine, Ser: 1.04 mg/dL — ABNORMAL HIGH (ref 0.57–1.00)
GFR calc Af Amer: 56 mL/min/{1.73_m2} — ABNORMAL LOW (ref >59)
GFR calc non Af Amer: 49 mL/min/{1.73_m2} — ABNORMAL LOW (ref >59)
Globulin, Total: 3.3 g/dL (ref 1.5–4.5)
Glucose: 164 mg/dL — ABNORMAL HIGH (ref 65–99)
Potassium: 4.2 mmol/L (ref 3.5–5.2)
Sodium: 139 mmol/L (ref 134–144)
Total Protein: 6.4 g/dL (ref 6.0–8.5)

## 2020-02-06 LAB — CBC
Hematocrit: 33.6 % — ABNORMAL LOW (ref 34.0–46.6)
Hemoglobin: 11.3 g/dL (ref 11.1–15.9)
MCH: 32.2 pg (ref 26.6–33.0)
MCHC: 33.6 g/dL (ref 31.5–35.7)
MCV: 96 fL (ref 79–97)
Platelets: 232 10*3/uL (ref 150–450)
RBC: 3.51 x10E6/uL — ABNORMAL LOW (ref 3.77–5.28)
RDW: 12.5 % (ref 11.7–15.4)
WBC: 9.3 10*3/uL (ref 3.4–10.8)

## 2020-02-07 ENCOUNTER — Other Ambulatory Visit: Payer: Self-pay | Admitting: Family Medicine

## 2020-02-07 DIAGNOSIS — R748 Abnormal levels of other serum enzymes: Secondary | ICD-10-CM

## 2020-02-10 ENCOUNTER — Telehealth: Payer: Self-pay

## 2020-02-10 NOTE — Telephone Encounter (Signed)
Patients daughter calls nurse line requesting lab results from 11/11. Please advise.

## 2020-02-11 IMAGING — CT CT HEAD W/O CM
4 series · 16 of 47 positions shown, 18 images · non-contrast
Comparison: Head CT dated 01/15/2019.

CLINICAL DATA: 87-year-old female with head trauma.

EXAM:
CT HEAD WITHOUT CONTRAST
TECHNIQUE: Contiguous axial images were obtained from the base of the skull
through the vertex without intravenous contrast.

[Series 3: head without · axial · non-contrast · 0.46mm/px · z∈[-204,-74]mm · 7 of 36 slices shown, 9 images]
[im 5/36  brain]
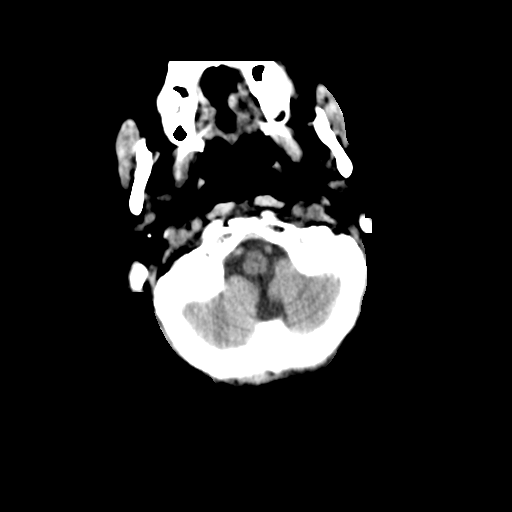
[im 5/36  bone]
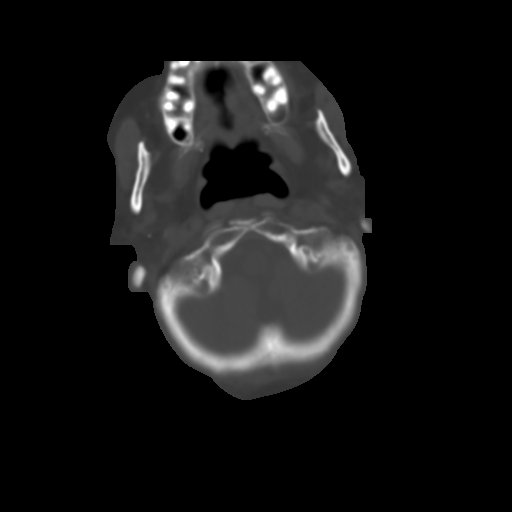
[im 9/36  brain]
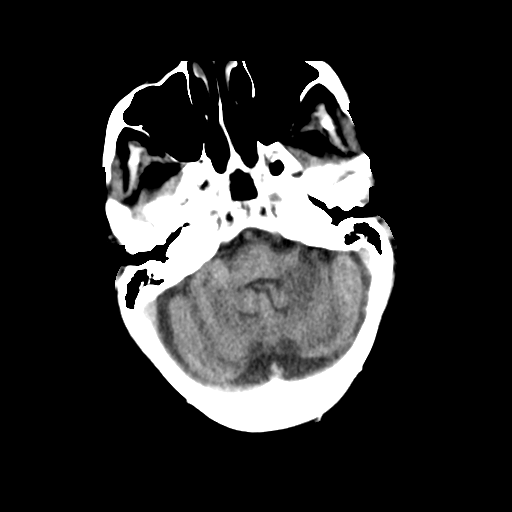
[im 14/36  brain]
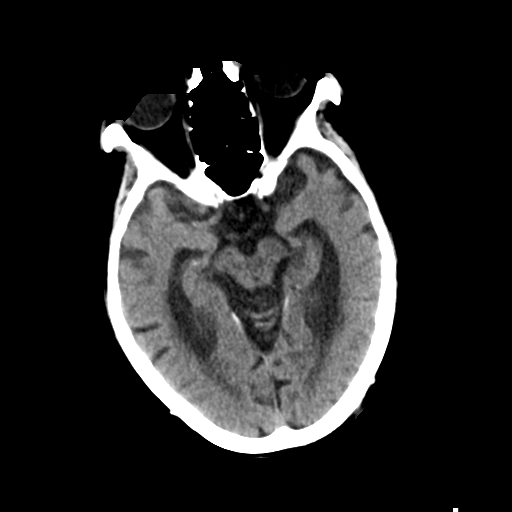
[im 18/36  brain]
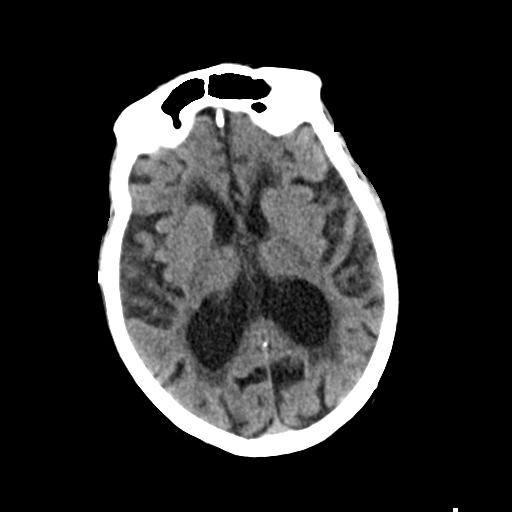
[im 22/36  brain]
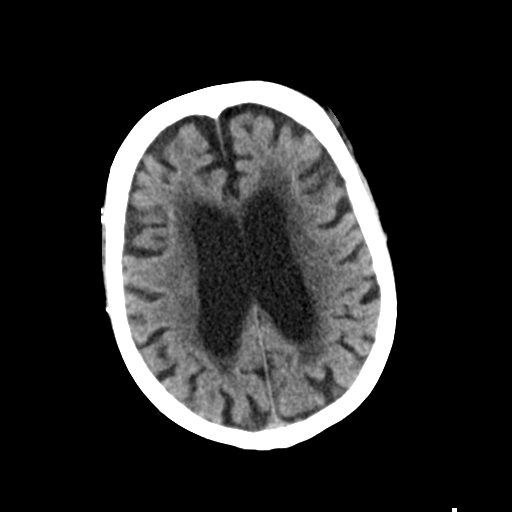
[im 22/36  bone]
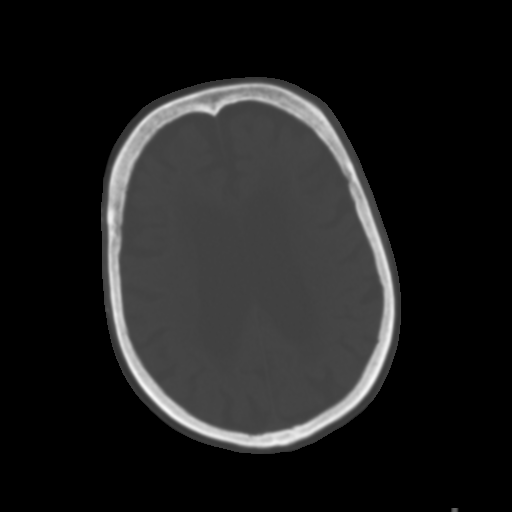
[im 27/36  brain]
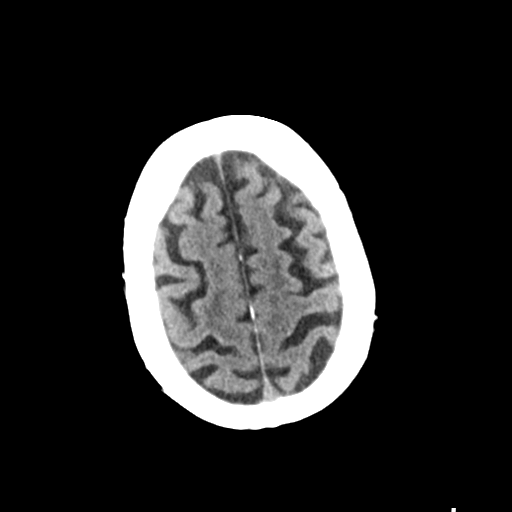
[im 31/36  brain]
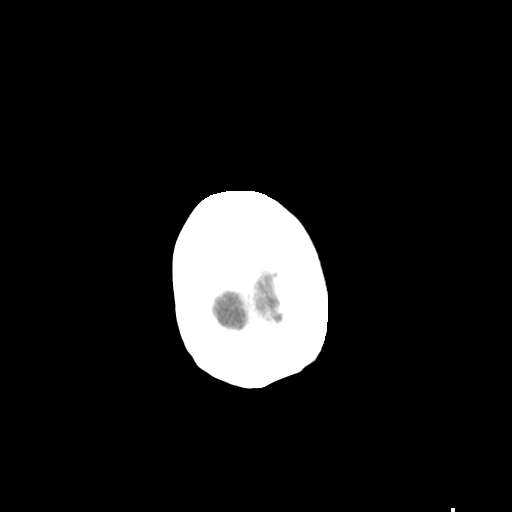

[Series 4: head bone · axial · 0.46mm/px · z∈[-208,-172]mm · 3 of 90 slices shown]
[im 9/90  bone]
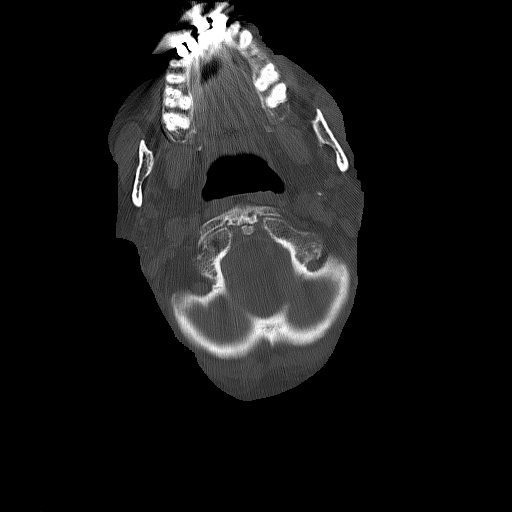
[im 18/90  bone]
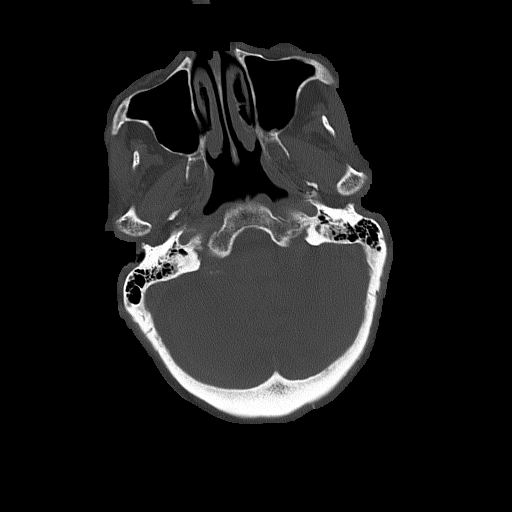
[im 27/90  bone]
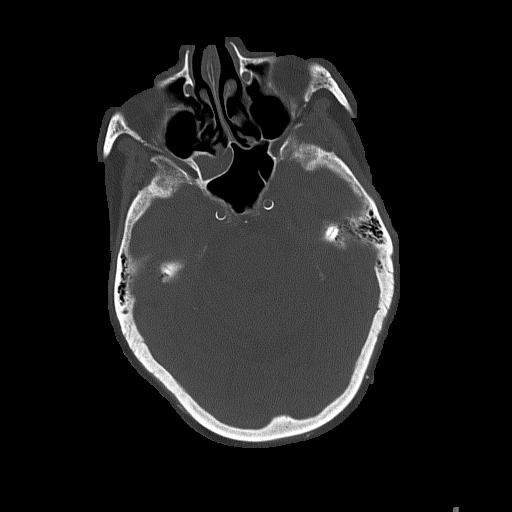

[Series 5: head without cor · coronal · non-contrast · 0.31mm/px · 3 of 67 slices shown]
[im 23/67  brain]
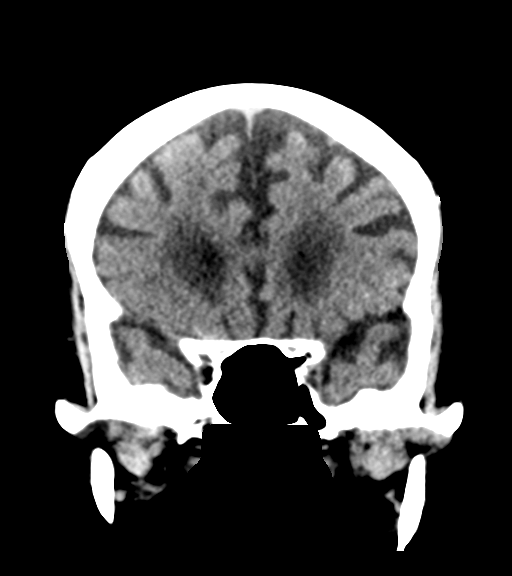
[im 30/67  brain]
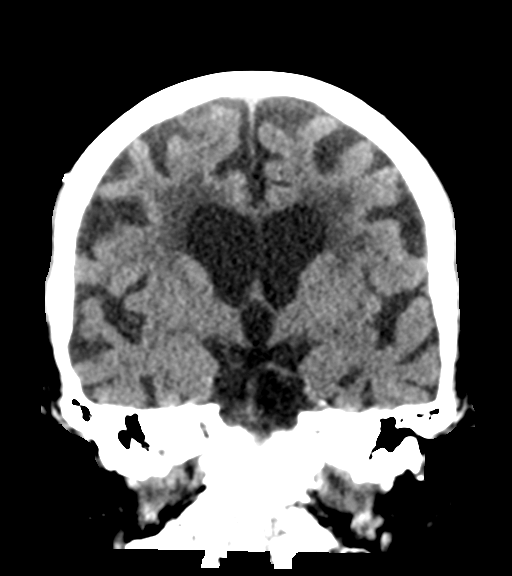
[im 37/67  brain]
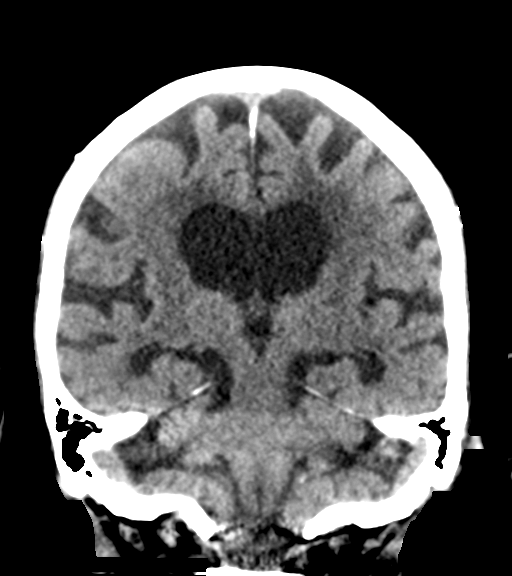

[Series 6: head without sag · sagittal · non-contrast · 0.35mm/px · 3 of 53 slices shown]
[im 18/53  brain]
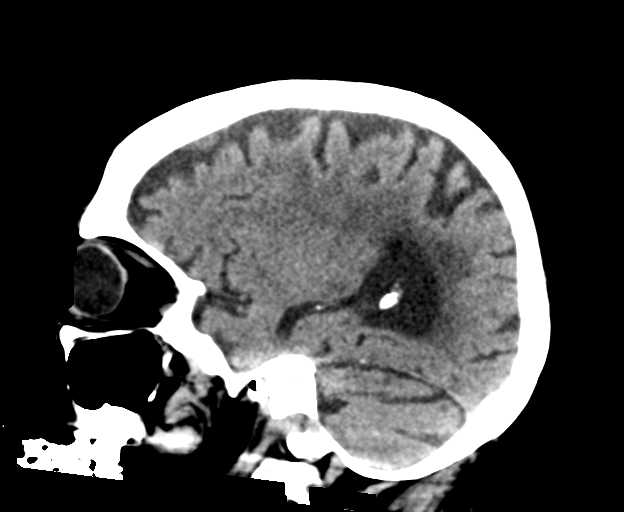
[im 27/53  brain]
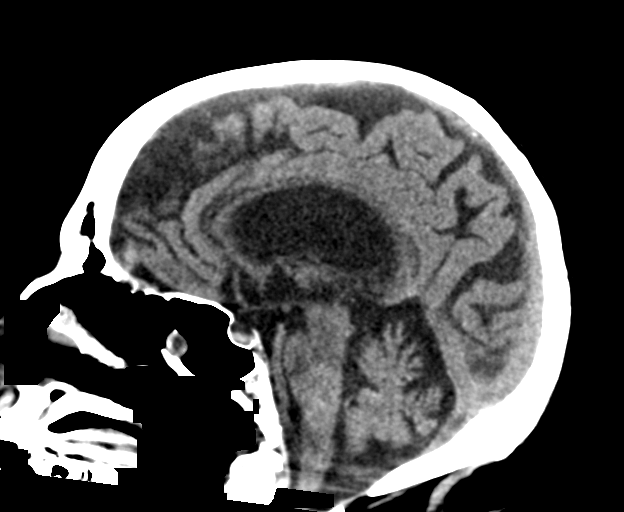
[im 35/53  brain]
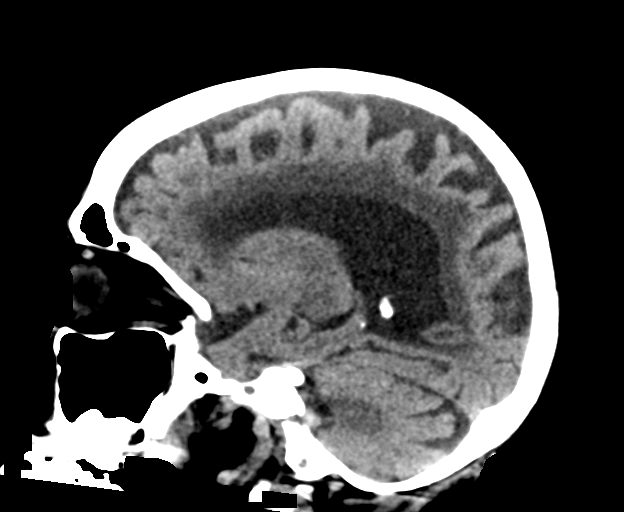

[16 of 47 positions shown; findings below may reference images not displayed]

FINDINGS: Brain: Moderate to advanced age-related atrophy and chronic
microvascular ischemic changes. There is no acute intracranial
hemorrhage. No mass effect or midline shift. No extra-axial fluid
collection.

Vascular: No hyperdense vessel or unexpected calcification.

Skull: Normal. Negative for fracture or focal lesion.

Sinuses/Orbits: There is opacification of the right sphenoid sinus.
The remainder of the visualized paranasal sinuses and mastoid air
cells are clear. No air-fluid level.

Other: None
IMPRESSION: 1. No acute intracranial hemorrhage.
2. Age-related atrophy and chronic microvascular ischemic changes.

## 2020-02-12 LAB — GAMMA GT: GGT: 15 IU/L (ref 0–60)

## 2020-02-12 LAB — SPECIMEN STATUS REPORT

## 2020-02-13 ENCOUNTER — Telehealth: Payer: Self-pay | Admitting: Family Medicine

## 2020-02-13 NOTE — Telephone Encounter (Signed)
   [facility] 02/13/2020 1st Attempt   Name: Allanah Mcfarland   MRN: [id number]   DOB: April 21, 1931   AGE: 86 y.o.   GENDER: female   PCP Benay Pike, MD.    Called patient's daughter [name] today regarding Community Resource Referral for transportation. Left message for Ms. [name] to give Care Guide a call back.   [place], Care Management Phone: [phone] Email: [email or web address]

## 2020-02-18 NOTE — Telephone Encounter (Signed)
   [facility] 02/18/2020   Name: Brenda [name]   MRN: [id number]   DOB: 04/15/1931   AGE: 86 y.o.   GENDER: female   PCP Benay Pike, MD.    Spoke with patient's daughter [name] she stated that currently she is using [facility] to transport her mother to her appointments, but it is becoming expensive due to [employer] not processing the claims correctly. [name] stated that her mom has [facility] Medicare and Tricare for insurance and that she should be covered for transportation. [name] asked if Care Guide can see if there are any other available resources that she can use within [facility]. Informed [name] that Care Guide will look for resources in [facility] and mail her the resources. [name] stated understanding.    [place], Care Management Phone: [phone] Email: [email or web address]

## 2020-03-02 NOTE — Telephone Encounter (Signed)
   [facility] 03/02/2020   Name: Brenda [name]   MRN: [id number]   DOB: 04/10/1931   AGE: 86 y.o.   GENDER: female   PCP Benay Pike, MD.    Spoke with patient's daughter Brenda [name] today regarding referral. [place] that Care Guide could not find another EMS transportation service besides [place]. [place] information for [employer] and also let her know that they only use vans and that might not be able to accommodate the needs of her mother. [facility] stated understanding and said that she will continue to use [employer] because her mother has to be transported by a stretcher. Patient stated no additional needs at this time.   Closing referral pending any other needs of patient.    [place], Care Management Phone: [phone] Email: [email or web address]

## 2020-03-13 ENCOUNTER — Telehealth: Payer: Self-pay | Admitting: Family Medicine

## 2020-03-13 NOTE — Telephone Encounter (Signed)
Received call from OOH line from patient's daughter than her mom has had bloody and pus fluid leakage from right big toe. There is also surrounding erythema around the toe. Her home RN noticed this today and let her daughter know. She has has reduced PO intake too. She thinks the toe may be ingrown and needs to be removed. I explained we do not have clinic appointments today. But recommended evaluation in urgent care or ED today instead as it is Friday. She rather monitor it over the weekend and be seen on Monday 20th Dec. Access to care app booked for 03/16/20 at 9:20am. Safety precautions provided to daughter ie to go to the ER if she has worsening pain, bleeding, purulent discharge, fevers/chills, spreading of erythema etc. Daughter happy with plan.  Lattie Haw PGY-2, [place]

## 2020-03-16 ENCOUNTER — Ambulatory Visit (INDEPENDENT_AMBULATORY_CARE_PROVIDER_SITE_OTHER): Payer: Medicare Other | Admitting: Family Medicine

## 2020-03-16 ENCOUNTER — Other Ambulatory Visit: Payer: Self-pay

## 2020-03-16 VITALS — BP 138/74 | HR 75 | Ht 60.0 in

## 2020-03-16 DIAGNOSIS — L600 Ingrowing nail: Secondary | ICD-10-CM

## 2020-03-16 DIAGNOSIS — M79674 Pain in right toe(s): Secondary | ICD-10-CM | POA: Diagnosis not present

## 2020-03-16 MED ORDER — DICLOXACILLIN SODIUM 250 MG PO CAPS: 250.0000 mg | ORAL_CAPSULE | Freq: Four times a day (QID) | ORAL | 0 refills | Status: AC

## 2020-03-16 NOTE — Patient Instructions (Signed)
It was wonderful to see you today.  Please bring ALL of your medications with you to every visit.   Today you were seen for toe pain. It is not continuing to drain pus, this is good. If it gets worse (I.e. increased swelling or warmth) please let us know. If you develop fever please let us know.   Please be sure to schedule follow up with dermatology if it is not getting better in 1-2 weeks.  Please call the clinic at [phone] if your symptoms worsen or you have any concerns. It was our pleasure to serve you.  Dr. Janus Molder

## 2020-03-16 NOTE — Assessment & Plan Note (Addendum)
Acute. Symptoms of redness and pus drainage started Friday. Today it continue to be erythematous but without drainage. Patient without systemic symptoms. Will give dicloxacillin for 5 days. Patient has allergies/sensitivities to Keflex, Bactrim, and Augmentin. Discuss follow up and scheduling with dermatology clinic to have toe nail removed or lifted if symptom persist or worsen. Discussed applying OTC antibiotic cream to area. Patient and daughter agree to plan.  -Dicloxacillin 250 mg QID x5 days -OTC abx cream -F/u for derm clinic visit if symptoms fail to improve or worsen -Return precaution of developing systemic symptoms of infection given

## 2020-03-16 NOTE — Progress Notes (Signed)
  Addition to patient encounter:  Patient PHQ9 filled out by daughter. Discussed with patient, continues to grieve husband's death. Denies SI/HI.  Gerlene Fee, DO 03/16/2020, 1:47 PM PGY-2, [place]

## 2020-03-16 NOTE — Progress Notes (Signed)
    SUBJECTIVE:   CHIEF COMPLAINT / HPI:   Brenda [name] is a 86 yo F who presents with her daughter for the issue below.   Right toe pain Experiencing toe pain since Friday. Had her toe nails clipped sometime last week by nursing staff that was not instructed to do so.  Was able to express drainage which appeared puslike from the toe.  Denies any other systemic symptoms like fever, vomiting.  Pain is described as a burning sensation.  Also has associated right heel pain.  Patient denies any history of gout.  Otherwise she states she feels well.  PERTINENT  PMH / PSH: Bedbound (presents to appointment on stretcher with EMS), T2DM (A1c 5.8 01/02/2019; no medications)  OBJECTIVE:   BP 138/74   Pulse 75   Ht 5' (1.524 m)   SpO2 98%   BMI 23.44 kg/m   PHQ9 SCORE ONLY 03/16/2020 04/25/2019 01/09/2019  PHQ-9 Total Score 25 6 0   General: Appears well, no acute distress. Age appropriate. Respiratory: normal effort Extremities: No edema or cyanosis. Skin: Right podagra with erythema and tender to palpation. No drainage with expression. It is not edematous or warm. Consistent with ingrown toe nail.  Neuro: alert and oriented Psych: normal affect  Media Information    ASSESSMENT/PLAN:   Ingrown toenail Acute. Symptoms of redness and pus drainage started Friday. Today it continue to be erythematous but without drainage. Patient without systemic symptoms. Will give dicloxacillin for 5 days. Patient has allergies/sensitivities to Keflex, Bactrim, and Augmentin. Discuss follow up and scheduling with dermatology clinic to have toe nail removed or lifted if symptom persist or worsen. Discussed applying OTC antibiotic cream to area. Patient and daughter agree to plan.  -Dicloxacillin 250 mg QID x5 days -OTC abx cream -F/u for derm clinic visit if symptoms fail to improve or worsen -Return precaution of developing systemic symptoms of infection given    Gerlene Fee, [place]   *Patient also discussed with Dr. Erin Hearing

## 2020-03-19 ENCOUNTER — Telehealth: Payer: Self-pay | Admitting: *Deleted

## 2020-03-19 NOTE — Telephone Encounter (Signed)
Received fax about the Rx for Dicloxacillin saying that it is not available and is on back order.  Can it be changed it ask. If there can be a change please resend new Rx. [place] Zimmerman Rumple, CMA

## 2020-03-23 NOTE — Telephone Encounter (Signed)
Attempted to call patient's daughter cell number (to busy tone x2 unable to leave voicemail). Called patient's number female answered the phone and said to call back when his sister is not at work. Will attempt to call at another time.   Lavonda Jumbo, DO 03/23/2020, 4:13 PM PGY-2, [facility]

## 2020-03-25 ENCOUNTER — Other Ambulatory Visit: Payer: Self-pay | Admitting: Family Medicine

## 2020-03-25 ENCOUNTER — Other Ambulatory Visit: Payer: Self-pay | Admitting: Cardiology

## 2020-03-25 DIAGNOSIS — R630 Anorexia: Secondary | ICD-10-CM

## 2020-03-25 NOTE — Telephone Encounter (Signed)
Xarelto 15mg  refill request received. Pt is 86 years old, weight-54.4kg, Crea-1.04 on 02/05/20, last seen by Dr. 13/10/21 on 04/30/2019, Diagnosis-Afib, CrCl-33.38ml/min; Dose is appropriate based on dosing criteria. Will send in refill to requested pharmacy.

## 2020-03-31 DIAGNOSIS — M2550 Pain in unspecified joint: Secondary | ICD-10-CM | POA: Diagnosis not present

## 2020-03-31 DIAGNOSIS — Z7401 Bed confinement status: Secondary | ICD-10-CM | POA: Diagnosis not present

## 2020-03-31 DIAGNOSIS — R5381 Other malaise: Secondary | ICD-10-CM | POA: Diagnosis not present

## 2020-03-31 DIAGNOSIS — R338 Other retention of urine: Secondary | ICD-10-CM | POA: Diagnosis not present

## 2020-03-31 DIAGNOSIS — Z743 Need for continuous supervision: Secondary | ICD-10-CM | POA: Diagnosis not present

## 2020-04-22 ENCOUNTER — Telehealth: Payer: Self-pay

## 2020-04-22 ENCOUNTER — Other Ambulatory Visit: Payer: Self-pay | Admitting: Cardiology

## 2020-04-22 NOTE — Telephone Encounter (Signed)
I don't have any other suggestions other than to have an ambulance take her to the ED if there are concerns of dehydration.

## 2020-04-22 NOTE — Telephone Encounter (Signed)
Patients daughter calls nurse line stating her mother tested positive for Covid on 01/16. Daughter reports she is still really congested and not eating or drinking well. Daughter reports very little urine output and dark in color. Daughter changes her bag ~2-3x a day and today only once so far. Daughter reports she is only taking fluids with her medications. Advised daughter she should be evaluated for dehydration. Daughter states, "I have no way of getting her anywhere, I have covid as well, and she is bed bound." Daughter states "the only way to get her anywhere is via ambulance and I have a lot of ambulance bills already." I advised calling 911, daughter stated, "they wont bring her back, ill have to arrange a ride." Will forward to PCP for any suggestions.

## 2020-04-22 NOTE — Telephone Encounter (Signed)
Spoke with patients daughter and she is amenable to calling 911 for evaluation and possible hospital transport.

## 2020-04-27 ENCOUNTER — Other Ambulatory Visit: Payer: Self-pay | Admitting: Family Medicine

## 2020-05-01 ENCOUNTER — Ambulatory Visit (INDEPENDENT_AMBULATORY_CARE_PROVIDER_SITE_OTHER): Payer: Medicare Other | Admitting: Family Medicine

## 2020-05-01 ENCOUNTER — Telehealth (INDEPENDENT_AMBULATORY_CARE_PROVIDER_SITE_OTHER): Payer: Medicare Other | Admitting: Family Medicine

## 2020-05-01 ENCOUNTER — Ambulatory Visit (HOSPITAL_COMMUNITY)
Admission: RE | Admit: 2020-05-01 | Discharge: 2020-05-01 | Disposition: A | Discharge status: Home / Self Care | Payer: Medicare Other | Source: Ambulatory Visit | Attending: Family Medicine | Admitting: Family Medicine

## 2020-05-01 ENCOUNTER — Other Ambulatory Visit: Payer: Self-pay

## 2020-05-01 VITALS — HR 71 | Temp 98.7°F

## 2020-05-01 VITALS — BP 129/58 | HR 70 | Ht 60.0 in

## 2020-05-01 DIAGNOSIS — R0989 Other specified symptoms and signs involving the circulatory and respiratory systems: Secondary | ICD-10-CM | POA: Diagnosis not present

## 2020-05-01 DIAGNOSIS — Z20822 Contact with and (suspected) exposure to covid-19: Secondary | ICD-10-CM

## 2020-05-01 DIAGNOSIS — K449 Diaphragmatic hernia without obstruction or gangrene: Secondary | ICD-10-CM | POA: Diagnosis not present

## 2020-05-01 DIAGNOSIS — M549 Dorsalgia, unspecified: Secondary | ICD-10-CM | POA: Diagnosis not present

## 2020-05-01 DIAGNOSIS — Z743 Need for continuous supervision: Secondary | ICD-10-CM | POA: Diagnosis not present

## 2020-05-01 DIAGNOSIS — Z7401 Bed confinement status: Secondary | ICD-10-CM | POA: Diagnosis not present

## 2020-05-01 DIAGNOSIS — M2550 Pain in unspecified joint: Secondary | ICD-10-CM | POA: Diagnosis not present

## 2020-05-01 DIAGNOSIS — E860 Dehydration: Secondary | ICD-10-CM | POA: Diagnosis not present

## 2020-05-01 DIAGNOSIS — R059 Cough, unspecified: Secondary | ICD-10-CM | POA: Diagnosis not present

## 2020-05-01 DIAGNOSIS — R058 Other specified cough: Secondary | ICD-10-CM

## 2020-05-01 DIAGNOSIS — R52 Pain, unspecified: Secondary | ICD-10-CM | POA: Diagnosis not present

## 2020-05-01 NOTE — Patient Instructions (Signed)
Thank you for coming to see me today. It was a pleasure. Today we discussed her covid infection. She is breathing well and comfortably. Her xray looked great. Please continue honey, cough syrups, fluids etc  Please follow-up with PCP as and when you need. She might benefit   If you have any questions or concerns, please do not hesitate to call the office at [phone].  If you develop fevers>100.5, shortness of breath, chest pain, palpitations, dizziness, abdominal pain, nausea, vomiting, diarrhea or cannot eat or drink then please go to the ER immediately.  We will get some labs today.  If they are abnormal or we need to do something about them, I will call you.  If they are normal, I will send you a message on MyChart (if it is active) or a letter in the mail.  If you don't hear from [place] in 2 weeks, please call the office at the number below.  Best wishes,   Dr Posey Pronto

## 2020-05-01 NOTE — Progress Notes (Signed)
     SUBJECTIVE:   CHIEF COMPLAINT / HPI:   Brenda [name] is a 87 y.o. female presents to [facility] with persistent cough.  Primary symptom: tested positive for COVID (rapid test) on 04/12/20 and since then she has had a persistent dry cough Duration: 2 weeks  Associated symptoms: reduced UOP via catheter over the last few days- daughter has only been changing catheter bag once a day and is concerned about dehydration. Denies dyspnea, chest pain, palpitations, dizziness. Fever? Tmax?: 101 few weeks back but afebrile since then  Sick contacts: none  Covid vaccination(s): unvaccinated against covid Daughter checks oxygen sats at home which are >95%  PERTINENT  PMH / PSH: Afib, HTN, COVID, T2DM  OBJECTIVE:   BP (!) 129/58   Pulse 70   Ht 5' (1.524 m)   SpO2 100%   BMI 23.44 kg/m    General: Alert, no acute distress, frail, pleasant  Cardio: Normal S1 and S2, RRR, no r/m/g Pulm: normal work of breathing, few bibasal crackles, good AE bilaterally  Abdomen: Bowel sounds normal. Abdomen soft and non-tender.  Extremities: No peripheral edema.  Neuro: Cranial nerves grossly intact   ASSESSMENT/PLAN:   Cough with exposure to COVID-19 virus Most likely differential is cough associated with COVID. CXR today reported as lower left consolidation VS atelectasis. However pt is well appearing with normal vital signs, reassuring pulmonary exam today. Reassured daughter that the cough will slowly improve and recommended conservative management such as OTC remedies and honey. Return precautions given to daughter. Would consider course or oral antibiotics if no improvement in sx in the next 1-2 weeks. Ordered BMP to check kidney function however pt is a hard stick and they were unable to draw labs. Encouraged PO hydration and follow up with PCP.     Lattie Haw, MD PGY-2 [place]

## 2020-05-01 NOTE — Assessment & Plan Note (Addendum)
Ongoing for 2 weeks without significant improvement. VSS at home stable. Patient does not appear to be in acute distress at this time. With recent COVID-19 infection suspect pneumonia. No recent CXR. Will send patient for CXR. Transportation coordination is difficult and must be via EMS as patient is bedbound. Will send to CXR and office visit today in [facility] at 2:30pm for a more thorough evaluation. Discussed stopping mucinex and robitussin (last known dose was yesterday). Daughter agreeable with plan. Beyond ruling out pneumonia would discuss supportive measures for postviral cough such as honey.

## 2020-05-01 NOTE — Addendum Note (Signed)
Addended by: Owens Shark, Athalee Esterline on: 05/01/2020 08:09 PM   Modules accepted: Level of Service

## 2020-05-01 NOTE — Progress Notes (Addendum)
[facility] Telemedicine Visit  Patient consented to have virtual visit and was identified by name and date of birth. Method of visit: Video  Encounter participants: Patient: Brenda [name] - located at home Provider: Gerlene Fee - located at home office Others (if applicable): Freddi Starr (patient's daughter)  Chief Complaint: Chest congestion  HPI:  Brenda [name] is a 87 yo F who is experiencing continued cough and chest congestion after known COVID infection early January (around 04/11/20). She has since tested negative 04/26/20. Endorses excessive couging and phelgm production that ranges from clear to brown in color. Daughter giving mucinex-DM and robitussin for cough without significant relief. Her daughter is concerned for pnuemonia. Denies fever and shortness of breath at this time.   ROS: per HPI  Pertinent PMHx: Bedbound, frequent falls, Recent COVID-19 infection, Afib, T2DM   Exam:  Pulse 71   Temp 98.7 F (37.1 C)   SpO2 97%   Respiratory: Speaking in full sentences. No nasal flaring. No acute distress.   Assessment/Plan:  Chest congestion Ongoing for 2 weeks without significant improvement. VSS at home stable. Patient does not appear to be in acute distress at this time. With recent COVID-19 infection suspect pneumonia. No recent CXR. Will send patient for CXR. Transportation coordination is difficult and must be via EMS as patient is bedbound. Will send to CXR and office visit today in [facility] at 2:30pm for a more thorough evaluation. Discussed stopping mucinex and robitussin (last known dose was yesterday). Daughter agreeable with plan. Beyond ruling out pneumonia would discuss supportive measures for postviral cough such as honey.    Time spent during visit with patient: 20 minutes

## 2020-05-02 ENCOUNTER — Telehealth: Payer: Self-pay | Admitting: Family Medicine

## 2020-05-02 ENCOUNTER — Other Ambulatory Visit: Payer: Self-pay | Admitting: Family Medicine

## 2020-05-02 DIAGNOSIS — J189 Pneumonia, unspecified organism: Secondary | ICD-10-CM

## 2020-05-02 DIAGNOSIS — R058 Other specified cough: Secondary | ICD-10-CM | POA: Insufficient documentation

## 2020-05-02 MED ORDER — CEFDINIR 300 MG PO CAPS: 300.0000 mg | ORAL_CAPSULE | Freq: Two times a day (BID) | ORAL | 0 refills | Status: AC

## 2020-05-02 MED ORDER — DOXYCYCLINE HYCLATE 100 MG PO TABS: 100.0000 mg | ORAL_TABLET | Freq: Two times a day (BID) | ORAL | 0 refills | Status: AC

## 2020-05-02 NOTE — Assessment & Plan Note (Addendum)
Most likely differential is cough associated with COVID. CXR today reported as lower left consolidation VS atelectasis. However pt is well appearing with normal vital signs, reassuring pulmonary exam today. Reassured daughter that the cough will slowly improve and recommended conservative management such as OTC remedies and honey. Return precautions given to daughter. Would consider course or oral antibiotics if no improvement in sx in the next 1-2 weeks. Ordered BMP to check kidney function however pt is a hard stick and they were unable to draw labs. Encouraged PO hydration and follow up with PCP.

## 2020-05-02 NOTE — Telephone Encounter (Signed)
Called and spoke with patient's daughter [name]. Discussed that due to LLL infiltrate doxycycline had been sent in, but we also should do a cephalosporin for broader coverage due to her mothers age and high risk. She has allergy listed to keflex, but per chart review had ceftriaxone during hospitalization in 2020 without adverse outcomes. Discussed this with her daughter that I feel it is reasonable to do cefdinir with the doxycycline, and we discussed signs of allergies to watch for including rash, hives, wheezing, changes in respiratory status. Based on chart result from yesterday CURB-65 score is 1 (although I do not have respiratory rate but was non-tachypneic and well appearing per Dr Posey Pronto and I do not have BUN due to unable to draw labs), thus I feel outpatient treatment is appropriate but would like labwork and close follow-up and monitoring as Ms Kopke is high risk for decompensation, I discussed this with her daughter [name] who is in agreement.   I also asked about dehydration, [name] notes her mother is still drinking her normal amount, 16 ounces of water several times per day, also some pedialyte, but has had decreased urine output for last week. In last 24 hours only 300cc urine and it was dark. They were unable to draw BMP in clinic yesterday due to her small veins and difficult stick. Discussed she usually has catheter emptied 2-3 times per day. Discussed that I am concerned that she may be getting too dehydrated with recent COVID (tested positive 04/12/20 per [name]'s report), possible superimposed bacterial pneumonia. Discussed strict return precautions that if she does not make at least 300 cc urine by this evening I recommend they go to ED for further evaluation/labwork and possible IVF vs admission. Transportation is difficult for them so [name] voiced agreement and that she would call EMS.   Answered all questions and concerns, [name] states is going to pick up antibiotics from  pharmacy today.  Yehuda Savannah MD

## 2020-05-02 NOTE — Telephone Encounter (Signed)
Called pt's daughter to inform her of the CXR changes from yesterday showing atelectasis vs consolidation. Daughter reports she has an on going cough and would like abx for her. Sent in doxycycline 100mg  BID for 7 days. Follow up in [facility] on 16th Jan.  Lattie Haw MD PGY-2

## 2020-05-03 ENCOUNTER — Encounter (HOSPITAL_COMMUNITY): Payer: Self-pay

## 2020-05-03 ENCOUNTER — Emergency Department (HOSPITAL_COMMUNITY)
Admission: EM | Admit: 2020-05-03 | Discharge: 2020-05-03 | Disposition: A | Discharge status: Home / Self Care | Payer: Medicare Other | Attending: Emergency Medicine | Admitting: Emergency Medicine

## 2020-05-03 ENCOUNTER — Emergency Department (HOSPITAL_COMMUNITY): Payer: Medicare Other

## 2020-05-03 DIAGNOSIS — G4489 Other headache syndrome: Secondary | ICD-10-CM | POA: Diagnosis not present

## 2020-05-03 DIAGNOSIS — Z5321 Procedure and treatment not carried out due to patient leaving prior to being seen by health care provider: Secondary | ICD-10-CM | POA: Diagnosis not present

## 2020-05-03 DIAGNOSIS — K449 Diaphragmatic hernia without obstruction or gangrene: Secondary | ICD-10-CM | POA: Diagnosis not present

## 2020-05-03 DIAGNOSIS — R059 Cough, unspecified: Secondary | ICD-10-CM | POA: Insufficient documentation

## 2020-05-03 DIAGNOSIS — J90 Pleural effusion, not elsewhere classified: Secondary | ICD-10-CM | POA: Diagnosis not present

## 2020-05-03 DIAGNOSIS — Z743 Need for continuous supervision: Secondary | ICD-10-CM | POA: Diagnosis not present

## 2020-05-03 DIAGNOSIS — U099 Post covid-19 condition, unspecified: Secondary | ICD-10-CM | POA: Diagnosis not present

## 2020-05-03 LAB — BASIC METABOLIC PANEL
Anion gap: 13 (ref 5–15)
BUN: 15 mg/dL (ref 8–23)
CO2: 21 mmol/L — ABNORMAL LOW (ref 22–32)
Calcium: 8.9 mg/dL (ref 8.9–10.3)
Chloride: 105 mmol/L (ref 98–111)
Creatinine, Ser: 1.05 mg/dL — ABNORMAL HIGH (ref 0.44–1.00)
GFR, Estimated: 52 mL/min — ABNORMAL LOW (ref >60)
Glucose, Bld: 143 mg/dL — ABNORMAL HIGH (ref 70–99)
Potassium: 4.1 mmol/L (ref 3.5–5.1)
Sodium: 139 mmol/L (ref 135–145)

## 2020-05-03 LAB — CBC WITH DIFFERENTIAL/PLATELET
Abs Immature Granulocytes: 0.04 10*3/uL (ref 0.00–0.07)
Basophils Absolute: 0.1 10*3/uL (ref 0.0–0.1)
Basophils Relative: 1 %
Eosinophils Absolute: 0.1 10*3/uL (ref 0.0–0.5)
Eosinophils Relative: 2 %
HCT: 33.9 % — ABNORMAL LOW (ref 36.0–46.0)
Hemoglobin: 10.8 g/dL — ABNORMAL LOW (ref 12.0–15.0)
Immature Granulocytes: 1 %
Lymphocytes Relative: 15 %
Lymphs Abs: 1.3 10*3/uL (ref 0.7–4.0)
MCH: 31.8 pg (ref 26.0–34.0)
MCHC: 31.9 g/dL (ref 30.0–36.0)
MCV: 99.7 fL (ref 80.0–100.0)
Monocytes Absolute: 0.7 10*3/uL (ref 0.1–1.0)
Monocytes Relative: 8 %
Neutro Abs: 6.4 10*3/uL (ref 1.7–7.7)
Neutrophils Relative %: 73 %
Platelets: 270 10*3/uL (ref 150–400)
RBC: 3.4 MIL/uL — ABNORMAL LOW (ref 3.87–5.11)
RDW: 13.4 % (ref 11.5–15.5)
WBC: 8.6 10*3/uL (ref 4.0–10.5)
nRBC: 0 % (ref 0.0–0.2)

## 2020-05-03 NOTE — ED Notes (Addendum)
Pts daughter took pt home. This NT and Lanny Hurst EMT assisted pt into the car.

## 2020-05-03 NOTE — ED Triage Notes (Signed)
Patient arrived from home to be evaluated for cough. Patient had Covid in January and daughter request evaluation for pneumonia. Patient with room air sats of 98%

## 2020-05-04 ENCOUNTER — Telehealth: Payer: Self-pay | Admitting: Family Medicine

## 2020-05-04 ENCOUNTER — Other Ambulatory Visit: Payer: Self-pay | Admitting: Family Medicine

## 2020-05-04 NOTE — Telephone Encounter (Signed)
Called pt's daughter to discuss her mom's condition. Yesterday she was very sleepy and had dyspnea so she called the EMS. With the EMS her sats were 77%. They took her to the ER an her daughter was not happy with the way her mom was treated. They had to wait 3 hours and have a repeat CXR and they were still not seen by a provider so she took her home. She was sleepy all of yesterday. She reports reduced UOP, cannot remember how much. She is currently at work so does not know what it is today. I provide return precautions-if reduced UOP <500cc over last 24 hrs then she should go back to the ER and she will need labs and likely IV fluids. Booked [facility] virtual app tomorrow morning and [facility] clinic visit on 05/06/20. Daughter expressed understanding.  Lattie Haw MD PGY-2

## 2020-05-04 NOTE — Telephone Encounter (Signed)
Called pt's daughter Donavan Burnet on the number provided. I will try again later.

## 2020-05-04 NOTE — Telephone Encounter (Signed)
Patients daughter Donavan Burnet is calling and would like to speak with either Dr. Posey Pronto or Dr. Thompson Grayer. She spoke with them over the weekend and took patient to the ED.   Patients daughter said she was extremely upset with the care at the ED and would like to discuss this further with either Dr. Thompson Grayer or Dr Posey Pronto.   The best call back is [phone]

## 2020-05-04 NOTE — Telephone Encounter (Signed)
Tried to call that number however no answer, I left a brief VM. I will try again later.

## 2020-05-05 ENCOUNTER — Other Ambulatory Visit: Payer: Self-pay

## 2020-05-05 ENCOUNTER — Telehealth (INDEPENDENT_AMBULATORY_CARE_PROVIDER_SITE_OTHER): Payer: Medicare Other | Admitting: Family Medicine

## 2020-05-05 DIAGNOSIS — J189 Pneumonia, unspecified organism: Secondary | ICD-10-CM | POA: Diagnosis not present

## 2020-05-05 NOTE — Progress Notes (Signed)
SUBJECTIVE:   CHIEF COMPLAINT / HPI:   Brenda [name] is a 87 y.o. female presents for PNA follow up  Brought into the clinic by her daughter.  PNA Presents for follow up for PNA. Seen in clinic on 2/4 and put on doxycycline and cefdinir. Now on day 4 of antibiotics.  Over the weekend she was seen in ED due to concerns for hypoxia however did not stay due to long wait.  She also had a virtual visit yesterday to monitor progress on antibiotics and urine output.  Daughter reports her recent UOP 400cc over last 24hrs and 100 cc since this morning.  She is eating and her home nurses are encouraging oral fluids. She also still has a lingering cough but no new shortness of breath and also a generalized headache.  She is been refusing analgesia for the headache.  Denies fevers, chest pain, palpitations or dizziness.  Daughter is adamant not to go back to the ER as she had a bad experience on the weekend  Depressed mood PHQ-9-17 today, positive question 9.  Daughter reports patient has been depressed since her husband died 2 years ago.  She tells her daily that she rather die and it is more passive.  No suicide attempts or self-harm.  She takes mirtazapine for appetite stimulation.  Goals of care Daughter would like full active treatment for her mother.  She had a bad experience with hospice when her dad was in hospital before he died.  PERTINENT  PMH / PSH: Covid, pneumonia, GERD, back pain, depressed mood  OBJECTIVE:   BP 124/62   Pulse 79   SpO2 97%    General: Alert, no acute distress Cardio: Normal S1 and S2, RRR, no r/m/g Pulm: CTAB, normal work of breathing Abdomen: Bowel sounds normal. Abdomen soft and non-tender.  Extremities: No peripheral edema.  Neuro: Cranial nerves grossly intact   ASSESSMENT/PLAN:   Goals of care, counseling/discussion Discuss goals of care with daughter. I explained that patient is frail, bed-bound with complex comorbidities and passively  suicidal which is contributing to a very poor quality of life. She also has a poor long term prognosis. I explained that it would be in the patient's best interest to maximize her quality of life and that speaking with palliative medicine would be a favorable place to start.  I explained that palliative medicine does not mean withdrawing care but rather maximizing patient's comfort in the latter stages of life. Her daughter reports that one of the home RNs looking after her mom also recommended palliative medicine.  She had a bad experiences palliative care when her dad was dying hospital.  She will think about this. Will defer this discussion with daughter to PCP.  Pneumonia of left lower lobe due to infectious organism Patient is improved compared to last week when I saw her in clinic, has an ongoing cough.  She continues to have a poor urine output but daughter is working on encouraging oral fluids.  Continue doxycycline and cefdinir. Strict ER precautions given to daughter.  BMP at recent ER visit showed stable kidney function and no AKI which is reassuring.  Follow-up at [facility] in 1 week.  Passive suicidal ideations Multifactorial likely secondary to loss of her husband a few years ago. She is frail and bedbound with complex co-morbidities which is contributing.  Discussed with daughter, no active SI or suicide attempts.  Unlikely to improve given patient's poor prognosis. Continue to monitor at [facility] visit.  Lattie Haw, MD PGY-2 [place]

## 2020-05-05 NOTE — Progress Notes (Signed)
[facility] Telemedicine Visit  Patient's daughter consented to have virtual visit and was identified by name and date of birth. She reports that her mother "will not talk on the phone" so she would be giving HPI.   Method of visit: Telephone  Encounter participants: Patient: Brenda [name] - located at home address  Provider: Eulis Foster - located at [facility]  Others (if applicable): Daughter, [name]   Chief Complaint: breathing problems   HPI:  Patient was diagnosed with pneumonia and started on cefdinir and doxycycline on 05/02/2020.  Connected with patient via telephone today for follow up as patient's daughter has called clinic and had ER visit due to dyspnea and hypoxia. Patient is reported to be improved in regards to her breathing and PNA.   Patient eating now. Has a total of 341mL of urine since last night. Temp was 98.6. HR was in 70s. Oxygen saturation is 98%. She continues to have intermittent dry cough that has been present since yesterday.  Has taken abx daily, denies skipped doses.   ROS: per HPI  Pertinent PMHx:  Left lower lobe infiltrate, pneumonia Chronic atrial fibrillation Diabetes  Exam:  Pulse 70 Comment: virtural viist, no vitals taken  Temp 98.6 F (37 C) (Oral)   SpO2 98%   Respiratory: unable to assess as patient's daughter answered the phone, per her report patient breathing comfortably while eating breakfast   Assessment/Plan:  Pneumonia of left lower lobe due to infectious organism Patient with improved respiratory status per daughter's report Continues Cefdinir and doxycycline  Patient is to keep appt on 2/9 with in person provider  No new concerns or needs at this time     Time spent during visit with patient: 10 minutes

## 2020-05-06 ENCOUNTER — Ambulatory Visit (INDEPENDENT_AMBULATORY_CARE_PROVIDER_SITE_OTHER): Payer: Medicare Other | Admitting: Family Medicine

## 2020-05-06 ENCOUNTER — Other Ambulatory Visit: Payer: Self-pay

## 2020-05-06 VITALS — BP 124/62 | HR 79

## 2020-05-06 DIAGNOSIS — R404 Transient alteration of awareness: Secondary | ICD-10-CM | POA: Diagnosis not present

## 2020-05-06 DIAGNOSIS — R45851 Suicidal ideations: Secondary | ICD-10-CM

## 2020-05-06 DIAGNOSIS — Z7189 Other specified counseling: Secondary | ICD-10-CM | POA: Diagnosis not present

## 2020-05-06 DIAGNOSIS — E860 Dehydration: Secondary | ICD-10-CM | POA: Diagnosis not present

## 2020-05-06 DIAGNOSIS — M2550 Pain in unspecified joint: Secondary | ICD-10-CM | POA: Diagnosis not present

## 2020-05-06 DIAGNOSIS — R279 Unspecified lack of coordination: Secondary | ICD-10-CM | POA: Diagnosis not present

## 2020-05-06 DIAGNOSIS — J189 Pneumonia, unspecified organism: Secondary | ICD-10-CM | POA: Diagnosis not present

## 2020-05-06 DIAGNOSIS — Z7401 Bed confinement status: Secondary | ICD-10-CM | POA: Diagnosis not present

## 2020-05-06 DIAGNOSIS — Z743 Need for continuous supervision: Secondary | ICD-10-CM | POA: Diagnosis not present

## 2020-05-06 DIAGNOSIS — R410 Disorientation, unspecified: Secondary | ICD-10-CM | POA: Diagnosis not present

## 2020-05-06 DIAGNOSIS — G459 Transient cerebral ischemic attack, unspecified: Secondary | ICD-10-CM | POA: Diagnosis not present

## 2020-05-06 NOTE — Patient Instructions (Addendum)
Thank you for coming to see me today. It was a pleasure. Today we discussed her PNA. Please continue the antibiotics.  Please encourage her to drink more fluid.   I would encourage speaking to palliative medicine about her goals of care and maximizing how comfortable she is in the end stages of life. Speak about this further with Dr Irish Elders.  Aim for urine output >500cc day. If less take her to the ER. If her headache gets worse then take her to the ER.  Please follow-up up next week.  If you have any questions or concerns, please do not hesitate to call the office at [phone].  If you develop fevers>100.5, shortness of breath, chest pain, palpitations, dizziness, abdominal pain, nausea, vomiting, diarrhea or cannot eat or drink then please go to the ER immediately.  Best wishes,   Dr Posey Pronto

## 2020-05-07 ENCOUNTER — Encounter: Payer: Self-pay | Admitting: Family Medicine

## 2020-05-07 DIAGNOSIS — R45851 Suicidal ideations: Secondary | ICD-10-CM | POA: Insufficient documentation

## 2020-05-07 DIAGNOSIS — Z7189 Other specified counseling: Secondary | ICD-10-CM | POA: Insufficient documentation

## 2020-05-07 DIAGNOSIS — J189 Pneumonia, unspecified organism: Secondary | ICD-10-CM | POA: Insufficient documentation

## 2020-05-07 NOTE — Assessment & Plan Note (Signed)
Patient with improved respiratory status per daughter's report Continues Cefdinir and doxycycline  Patient is to keep appt on 2/9 with in person provider  No new concerns or needs at this time

## 2020-05-07 NOTE — Assessment & Plan Note (Addendum)
Discuss goals of care with daughter. I explained that patient is frail, bed-bound with complex comorbidities and passively suicidal which is contributing to a very poor quality of life. She also has a poor long term prognosis. I explained that it would be in the patient's best interest to maximize her quality of life and that speaking with palliative medicine would be a favorable place to start.  I explained that palliative medicine does not mean withdrawing care but rather maximizing patient's comfort in the latter stages of life. Her daughter reports that one of the home RNs looking after her mom also recommended palliative medicine.  She had a bad experiences palliative care when her dad was dying hospital.  She will think about this. Will defer this discussion with daughter to PCP.

## 2020-05-07 NOTE — Assessment & Plan Note (Addendum)
Multifactorial likely secondary to loss of her husband a few years ago. She is frail and bedbound with complex co-morbidities which is contributing.  Discussed with daughter, no active SI or suicide attempts.  Unlikely to improve given patient's poor prognosis. Continue to monitor at [facility] visit.

## 2020-05-07 NOTE — Assessment & Plan Note (Signed)
Patient is improved compared to last week when I saw her in clinic, has an ongoing cough.  She continues to have a poor urine output but daughter is working on encouraging oral fluids.  Continue doxycycline and cefdinir. Strict ER precautions given to daughter.  BMP at recent ER visit showed stable kidney function and no AKI which is reassuring.  Follow-up at [facility] in 1 week.

## 2020-05-12 DIAGNOSIS — R338 Other retention of urine: Secondary | ICD-10-CM | POA: Diagnosis not present

## 2020-05-12 DIAGNOSIS — G459 Transient cerebral ischemic attack, unspecified: Secondary | ICD-10-CM | POA: Diagnosis not present

## 2020-05-12 DIAGNOSIS — Z7401 Bed confinement status: Secondary | ICD-10-CM | POA: Diagnosis not present

## 2020-05-12 DIAGNOSIS — M2550 Pain in unspecified joint: Secondary | ICD-10-CM | POA: Diagnosis not present

## 2020-05-12 DIAGNOSIS — Z743 Need for continuous supervision: Secondary | ICD-10-CM | POA: Diagnosis not present

## 2020-05-12 DIAGNOSIS — R404 Transient alteration of awareness: Secondary | ICD-10-CM | POA: Diagnosis not present

## 2020-05-12 DIAGNOSIS — R410 Disorientation, unspecified: Secondary | ICD-10-CM | POA: Diagnosis not present

## 2020-05-13 ENCOUNTER — Other Ambulatory Visit: Payer: Self-pay

## 2020-05-13 ENCOUNTER — Ambulatory Visit (INDEPENDENT_AMBULATORY_CARE_PROVIDER_SITE_OTHER): Payer: Medicare Other | Admitting: Family Medicine

## 2020-05-13 VITALS — BP 128/64 | HR 71

## 2020-05-13 DIAGNOSIS — R410 Disorientation, unspecified: Secondary | ICD-10-CM | POA: Diagnosis not present

## 2020-05-13 DIAGNOSIS — R531 Weakness: Secondary | ICD-10-CM | POA: Diagnosis not present

## 2020-05-13 DIAGNOSIS — J189 Pneumonia, unspecified organism: Secondary | ICD-10-CM

## 2020-05-13 DIAGNOSIS — Z7401 Bed confinement status: Secondary | ICD-10-CM | POA: Diagnosis not present

## 2020-05-13 DIAGNOSIS — R34 Anuria and oliguria: Secondary | ICD-10-CM | POA: Diagnosis not present

## 2020-05-13 DIAGNOSIS — M549 Dorsalgia, unspecified: Secondary | ICD-10-CM | POA: Diagnosis not present

## 2020-05-13 DIAGNOSIS — M2550 Pain in unspecified joint: Secondary | ICD-10-CM | POA: Diagnosis not present

## 2020-05-13 NOTE — Progress Notes (Signed)
    SUBJECTIVE:   CHIEF COMPLAINT / HPI:   Patient is here for follow-up of her recent treatment for pneumonia and for evaluation of her urinary output.  Patient states that her pneumonia symptoms are much better.  No difficulty breathing.  Daughter does not have any concerns.  Daughter brought with her a log of recent urine output values.  Values range from 300 to 700 mL over a 24-hour period or less.    PERTINENT  PMH / PSH:   OBJECTIVE:   BP 128/64   Pulse 71   SpO2 97%   Gen: alert.  Laying in Insurance underwriter. Accompanied by daughter. No acute distress.  CV: RRR.  Pulm: LCTAB.  No respiratory distress.   ASSESSMENT/PLAN:   Pneumonia of left lower lobe due to infectious organism No signs of respiratory distress.  Lungs sound clear.  Advised to follow up as needed.   Decreased urine output Patient's daughter brought records of urine output for the past week.  Levels vary from 300-700 ml/day.  Urine output low but adequate given her size and age. No further workup at this time.  Advised to continue monitoring UOP periodically      Benay Pike, MD [place]

## 2020-05-13 NOTE — Assessment & Plan Note (Signed)
No signs of respiratory distress.  Lungs sound clear.  Advised to follow up as needed.

## 2020-05-13 NOTE — Assessment & Plan Note (Signed)
Patient's daughter brought records of urine output for the past week.  Levels vary from 300-700 ml/day.  Urine output low but adequate given her size and age. No further workup at this time.  Advised to continue monitoring UOP periodically

## 2020-05-13 NOTE — Patient Instructions (Signed)
It was nice to meet you today,  Your lungs sound great. Thank you for recording the urine output that was very helpful.  I would like you to schedule an appointment with me just for regular follow-up purposes at sometime in the next few months as I am Ms. Demetrius Charity PCP.  Have a great day,  Clemetine Marker, MD

## 2020-05-21 ENCOUNTER — Telehealth: Payer: Self-pay

## 2020-05-21 NOTE — Telephone Encounter (Signed)
The patient son not on dpr tried sending transmission with patient monitor. I did tell him we did not receive it. I told let him know I can not give him anymore information because he is not on the dpr. I gave the number to tech support to get additional help.

## 2020-05-22 ENCOUNTER — Ambulatory Visit (INDEPENDENT_AMBULATORY_CARE_PROVIDER_SITE_OTHER): Payer: Medicare Other

## 2020-05-22 DIAGNOSIS — I472 Ventricular tachycardia, unspecified: Secondary | ICD-10-CM

## 2020-05-22 LAB — CUP PACEART REMOTE DEVICE CHECK
Battery Remaining Longevity: 18 mo
Battery Remaining Percentage: 20 %
Brady Statistic RA Percent Paced: 75 %
Brady Statistic RV Percent Paced: 0 %
Date Time Interrogation Session: 20220224172700
HighPow Impedance: 49 Ohm
Implantable Lead Implant Date: 20111110
Implantable Lead Implant Date: 20111110
Implantable Lead Location: 753859
Implantable Lead Location: 753860
Implantable Lead Model: 157
Implantable Lead Model: 4135
Implantable Lead Serial Number: 28803817
Implantable Lead Serial Number: 302861
Implantable Pulse Generator Implant Date: 20111110
Lead Channel Impedance Value: 445 Ohm
Lead Channel Impedance Value: 764 Ohm
Lead Channel Pacing Threshold Amplitude: 0.3 V
Lead Channel Pacing Threshold Amplitude: 0.8 V
Lead Channel Pacing Threshold Pulse Width: 0.4 ms
Lead Channel Pacing Threshold Pulse Width: 0.4 ms
Lead Channel Setting Pacing Amplitude: 2 V
Lead Channel Setting Pacing Amplitude: 2.4 V
Lead Channel Setting Pacing Pulse Width: 0.4 ms
Lead Channel Setting Sensing Sensitivity: 0.6 mV
Pulse Gen Serial Number: 169112

## 2020-05-28 ENCOUNTER — Other Ambulatory Visit: Payer: Self-pay | Admitting: Cardiology

## 2020-05-29 NOTE — Progress Notes (Signed)
Remote ICD transmission.   

## 2020-06-23 DIAGNOSIS — R410 Disorientation, unspecified: Secondary | ICD-10-CM | POA: Diagnosis not present

## 2020-06-23 DIAGNOSIS — R338 Other retention of urine: Secondary | ICD-10-CM | POA: Diagnosis not present

## 2020-06-23 DIAGNOSIS — Z7401 Bed confinement status: Secondary | ICD-10-CM | POA: Diagnosis not present

## 2020-06-23 DIAGNOSIS — R404 Transient alteration of awareness: Secondary | ICD-10-CM | POA: Diagnosis not present

## 2020-06-23 DIAGNOSIS — Z743 Need for continuous supervision: Secondary | ICD-10-CM | POA: Diagnosis not present

## 2020-06-23 DIAGNOSIS — G459 Transient cerebral ischemic attack, unspecified: Secondary | ICD-10-CM | POA: Diagnosis not present

## 2020-06-23 DIAGNOSIS — M2550 Pain in unspecified joint: Secondary | ICD-10-CM | POA: Diagnosis not present

## 2020-07-01 ENCOUNTER — Other Ambulatory Visit: Payer: Self-pay | Admitting: Family Medicine

## 2020-07-01 DIAGNOSIS — R630 Anorexia: Secondary | ICD-10-CM

## 2020-07-03 ENCOUNTER — Telehealth: Payer: Self-pay | Admitting: Cardiology

## 2020-07-03 ENCOUNTER — Other Ambulatory Visit: Payer: Self-pay

## 2020-07-03 MED ORDER — VERAPAMIL HCL ER 240 MG PO TBCR: 240.0000 mg | EXTENDED_RELEASE_TABLET | Freq: Every day | ORAL | 3 refills | Status: DC

## 2020-07-03 NOTE — Telephone Encounter (Signed)
*  STAT* If patient is at the pharmacy, call can be transferred to refill team.   1. Which medications need to be refilled? (please list name of each medication and dose if known) CALAN SR 240 MG CR tablet  2. Which pharmacy/location (including street and city if local pharmacy) is medication to be sent to? [facility] [address] [place]  3. Do they need a 30 day or 90 day supply? 90 day supply

## 2020-07-12 ENCOUNTER — Other Ambulatory Visit: Payer: Self-pay | Admitting: Cardiology

## 2020-07-15 ENCOUNTER — Other Ambulatory Visit: Payer: Self-pay | Admitting: Cardiology

## 2020-07-20 ENCOUNTER — Telehealth: Payer: Self-pay | Admitting: Family Medicine

## 2020-07-20 NOTE — Telephone Encounter (Signed)
Daughter is calling stating that her mother has cathter and her urine is smelly really bad, color is like light rustic, and she is concerned for blood in urine. If doctor could place orders for lab for testing daughter said she could bring in a sample. Please advise. Thanks!

## 2020-07-21 NOTE — Telephone Encounter (Signed)
This pt should come in to clinic for evaluation to evaluate if she is having symptoms in addition to the urine testing.  She should change her foley if possible before her appointment.  If she is having symptoms including fever/chills, dysuria, burning, confusion, or other symptoms of infection she should go to the ED.

## 2020-07-21 NOTE — Telephone Encounter (Signed)
Spoke to pt daughter. Pt has to come to the office by ambulance. Pt is not having any symptomes. Hospice comes in twice a week. Daughter will have them run the urine.   Ottis Stain, CMA

## 2020-07-25 ENCOUNTER — Other Ambulatory Visit: Payer: Self-pay | Admitting: Family Medicine

## 2020-08-04 ENCOUNTER — Telehealth (INDEPENDENT_AMBULATORY_CARE_PROVIDER_SITE_OTHER): Payer: Medicare Other | Admitting: Cardiology

## 2020-08-04 ENCOUNTER — Encounter: Payer: Self-pay | Admitting: Cardiology

## 2020-08-04 ENCOUNTER — Other Ambulatory Visit: Payer: Self-pay

## 2020-08-04 DIAGNOSIS — E785 Hyperlipidemia, unspecified: Secondary | ICD-10-CM

## 2020-08-04 DIAGNOSIS — I472 Ventricular tachycardia: Secondary | ICD-10-CM | POA: Diagnosis not present

## 2020-08-04 DIAGNOSIS — I10 Essential (primary) hypertension: Secondary | ICD-10-CM

## 2020-08-04 DIAGNOSIS — I480 Paroxysmal atrial fibrillation: Secondary | ICD-10-CM

## 2020-08-04 NOTE — Progress Notes (Signed)
Electrophysiology TeleHealth Note   Due to national recommendations of social distancing due to COVID 19, an audio/video telehealth visit is felt to be most appropriate for this patient at this time.  See Epic message for the patient's consent to telehealth for [facility].   Date:  08/04/2020   ID:  Brenda [name], DOB 27-Aug-1931, MRN [id number]  Location: patient's home  Provider location: [address], [place]  Evaluation Performed: Follow-up visit  PCP:  Benay Pike, MD  Cardiologist:  Secret Kristensen Meredith Leeds, MD  Electrophysiologist:  Dr Curt Bears  Chief Complaint:  AF  History of Present Illness:    Brenda [name] is a 87 y.o. female who presents via audio/video conferencing for a telehealth visit today.  Since last being seen in our clinic, the patient reports doing very well.  Today, she denies symptoms of palpitations, chest pain, shortness of breath,  lower extremity edema, dizziness, presyncope, or syncope.  The patient is otherwise without complaint today.  The patient denies symptoms of fevers, chills, cough, or new SOB worrisome for COVID 19.  She has a history significant for atrial fibrillation, diabetes, hiatal hernia, hyperlipidemia, hypertension.  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  She has been on multiple ICD shocks for inappropriate therapy due to atrial fibrillation.  Medications have been adjusted and this is not happened recently.  Today, denies symptoms of palpitations, chest pain, shortness of breath, orthopnea, PND, lower extremity edema, claudication, dizziness, presyncope, syncope, bleeding, or neurologic sequela. The patient is tolerating medications without difficulties.  She is currently undergoing care with hospice.  [facility] take over her prescribing.  She has been doing well, though she continues to be bedbound.  Past Medical History:  Diagnosis Date  . A-fib ([place])    . Anemia   . Arthritis   . Basal cell carcinoma    nose  . Diabetes mellitus without complication ([place])    pt denies diabetes  . GERD (gastroesophageal reflux disease)   . Hiatal hernia   . HLD (hyperlipidemia)   . Hypertension   . Seizure ([place]) 2011    Past Surgical History:  Procedure Laterality Date  . CARDIAC DEFIBRILLATOR PLACEMENT    . CARDIAC DEFIBRILLATOR PLACEMENT    . CATARACT EXTRACTION Left 02/2001  . CHOLECYSTECTOMY    . COLONOSCOPY WITH PROPOFOL N/A 07/19/2017   Procedure: COLONOSCOPY WITH PROPOFOL;  Surgeon: Jerene Bears, MD;  Location: [place];  Service: Gastroenterology;  Laterality: N/A;  . ESOPHAGOGASTRODUODENOSCOPY (EGD) WITH PROPOFOL N/A 07/19/2017   Procedure: ESOPHAGOGASTRODUODENOSCOPY (EGD) WITH PROPOFOL;  Surgeon: Jerene Bears, MD;  [facility] END[facility]COPY;  Service: Gastroenterology;  Laterality: N/A;  . LUMBAR LAMINECTOMY  06/11/2001  . LUMBAR LAMINECTOMY  02/09/2006  . LUMBAR LAMINECTOMY  07/07/2006   with spianal cord stimulator  . SPINAL CORD STIMULATOR REMOVAL  14/2009  . TUBAL LIGATION      Current Outpatient Medications  Medication Sig Dispense Refill  . acetaminophen (TYLENOL) 500 MG tablet Take 500-1,000 mg by mouth every 6 (six) hours as needed for mild pain.    . ferrous sulfate 325 (65 FE) MG tablet Take 325 mg by mouth every Monday, Wednesday, and Friday.  (Patient not taking: Reported on 03/16/2020)    . furosemide (LASIX) 20 MG tablet Take 20 mg by mouth daily as needed for fluid. (Patient not taking: Reported on 03/16/2020)    . isosorbide mononitrate (IMDUR) 30 MG 24 hr tablet Take  1 tablet (30 mg total) by mouth daily. 90 tablet 3  . metoprolol tartrate (LOPRESSOR) 100 MG tablet TAKE 1 TABLET TWICE A DAY 60 tablet 0  . mirtazapine (REMERON) 7.5 MG tablet TAKE ONE TABLET BY MOUTH EVERY NIGHT AT BEDTIME 90 tablet 0  . omeprazole (PRILOSEC) 40 MG capsule TAKE 1 CAPSULE(40 MG) BY MOUTH TWICE DAILY 180 capsule 0  . phenazopyridine  (PYRIDIUM) 200 MG tablet Take 1 tablet (200 mg total) by mouth 3 (three) times daily as needed for pain. (Patient not taking: Reported on 03/16/2020) 21 tablet 0  . Propylene Glycol 0.6 % SOLN Place 1 drop into both eyes daily.    . sucralfate (CARAFATE) 1 GM/10ML suspension Take 1 g by mouth every 6 (six) hours as needed. (Patient not taking: Reported on 03/16/2020)    . tolterodine (DETROL LA) 2 MG 24 hr capsule TAKE ONE CAPSULE BY MOUTH DAILY 90 capsule 1  . verapamil (CALAN SR) 240 MG CR tablet Take 1 tablet (240 mg total) by mouth at bedtime. 90 tablet 3  . VITAMIN D PO Take 1 capsule by mouth daily.    Alveda Reasons 15 MG TABS tablet TAKE 1 TABLET DAILY WITH SUPPER 90 tablet 1   No current facility-administered medications for this visit.    Allergies:   Sotalol, Erythromycin, Augmentin [amoxicillin-pot clavulanate], Crestor [rosuvastatin calcium], Keflex [cephalexin], and Septra [sulfamethoxazole-trimethoprim]   Social History:  The patient  reports that she has never smoked. She has never used smokeless tobacco. She reports that she does not drink alcohol and does not use drugs.   Family History:  The patient's  family history includes Breast cancer in her sister; Diabetes in her mother, sister, and son; Heart failure in her father and sister.   ROS:  Please see the history of present illness.   All other systems are personally reviewed and negative.    Exam:    Vital Signs:  There were no vitals taken for this visit.  Frail though well appearing, alert and conversant, regular work of breathing,  good skin color Eyes- anicteric, neuro- grossly intact, skin- no apparent rash or lesions or cyanosis, mouth- oral mucosa is pink   Labs/Other Tests and Data Reviewed:    Recent Labs: 02/05/2020: ALT 11 05/03/2020: BUN 15; Creatinine, Ser 1.05; Hemoglobin 10.8; Platelets 270; Potassium 4.1; Sodium 139   Wt Readings from Last 3 Encounters:  04/25/19 120 lb (54.4 kg)  04/11/19 125 lb  (56.7 kg)  01/17/19 137 lb (62.1 kg)     Other studies personally reviewed: Additional studies/ records that were reviewed today include: epic notes   Last device remote is reviewed from [place] PDF dated 05/22/20 which reveals normal device function, no arrhythmias    ASSESSMENT & PLAN:    1.  Paroxysmal atrial fibrillation: Currently on Xarelto and metoprolol.  CHA2DS2-VASc of 5.  Has remained in sinus rhythm.  2.  Ventricular tachycardia: Status post [place].  Has been functioning normally on remote checks.  Tachycardia therapies have been turned off.  3.  Hypertension: Followed by hospice.  4.  Hyperlipidemia: Patient is currently on atorvastatin.  As she is in hospice, Jovaun Levene stop this medication.  COVID 19 screen The patient denies symptoms of COVID 19 at this time.  The importance of social distancing was discussed today.  Follow-up: 1 year  Current medicines are reviewed at length with the patient today.   The patient does not have concerns regarding her medicines.  The following changes  were made today: Stop atorvastatin  Labs/ tests ordered today include:  No orders of the defined types were placed in this encounter.    Patient Risk:  after full review of this patients clinical status, I feel that they are at moderate risk at this time.     Signed, Kris No Meredith Leeds, MD  08/04/2020 3:05 PM     [place] [address] [phone] (office) [phone] (fax)

## 2020-08-04 NOTE — Patient Instructions (Signed)
Medication Instructions:  Your physician recommends that you continue on your current medications as directed. Please refer to the Current Medication list given to you today.  *If you need a refill on your cardiac medications before your next appointment, please call your pharmacy*   Lab Work: None ordered   Testing/Procedures: None ordered   Follow-Up: At [facility], you and your health needs are our priority.  As part of our continuing mission to provide you with exceptional heart care, we have created designated Provider Care Teams.  These Care Teams include your primary Cardiologist (physician) and Advanced Practice Providers (APPs -  Physician Assistants and Nurse Practitioners) who all work together to provide you with the care you need, when you need it.   Remote monitoring is used to monitor your Pacemaker or ICD from home. This monitoring reduces the number of office visits required to check your device to one time per year. It allows [place] to keep an eye on the functioning of your device to ensure it is working properly. You are scheduled for a device check from home on 08/21/2020. You may send your transmission at any time that day. If you have a wireless device, the transmission will be sent automatically. After your physician reviews your transmission, you will receive a postcard with your next transmission date.  Your next appointment:   1 year(s)  The format for your next appointment:   In Person  Provider:   Allegra Lai, MD   Thank you for choosing [facility]!!   Trinidad Curet, RN [phone]

## 2020-08-21 ENCOUNTER — Ambulatory Visit (INDEPENDENT_AMBULATORY_CARE_PROVIDER_SITE_OTHER): Payer: Medicare Other

## 2020-08-21 DIAGNOSIS — I472 Ventricular tachycardia, unspecified: Secondary | ICD-10-CM

## 2020-08-22 LAB — CUP PACEART REMOTE DEVICE CHECK
Battery Remaining Longevity: 12 mo
Battery Remaining Percentage: 14 %
Brady Statistic RA Percent Paced: 75 %
Brady Statistic RV Percent Paced: 0 %
Date Time Interrogation Session: 20220527144400
HighPow Impedance: 49 Ohm
Implantable Lead Implant Date: 20111110
Implantable Lead Implant Date: 20111110
Implantable Lead Location: 753859
Implantable Lead Location: 753860
Implantable Lead Model: 157
Implantable Lead Model: 4135
Implantable Lead Serial Number: 28803817
Implantable Lead Serial Number: 302861
Implantable Pulse Generator Implant Date: 20111110
Lead Channel Impedance Value: 446 Ohm
Lead Channel Impedance Value: 830 Ohm
Lead Channel Pacing Threshold Amplitude: 0.3 V
Lead Channel Pacing Threshold Amplitude: 0.8 V
Lead Channel Pacing Threshold Pulse Width: 0.4 ms
Lead Channel Pacing Threshold Pulse Width: 0.4 ms
Lead Channel Setting Pacing Amplitude: 2 V
Lead Channel Setting Pacing Amplitude: 2.4 V
Lead Channel Setting Pacing Pulse Width: 0.4 ms
Lead Channel Setting Sensing Sensitivity: 0.6 mV
Pulse Gen Serial Number: 169112

## 2020-09-04 NOTE — Progress Notes (Signed)
Remote ICD transmission.   

## 2020-09-08 ENCOUNTER — Other Ambulatory Visit: Payer: Self-pay | Admitting: Cardiology

## 2020-09-21 ENCOUNTER — Other Ambulatory Visit: Payer: Self-pay | Admitting: Cardiology

## 2020-09-21 DIAGNOSIS — I480 Paroxysmal atrial fibrillation: Secondary | ICD-10-CM

## 2020-09-21 NOTE — Telephone Encounter (Signed)
Xarelto 15mg  refill request received. Pt is 87 years old, weight-54.4kg, Crea-1.05 on 05/03/20, last seen by Dr. Curt Bears on 08/04/20 via Telemedicine, Diagnosis-Afib, CrCl-33.60ml/min; Dose is appropriate based on dosing criteria. Will send in refill to requested pharmacy.

## 2020-10-24 ENCOUNTER — Other Ambulatory Visit: Payer: Self-pay | Admitting: Family Medicine

## 2020-10-26 ENCOUNTER — Other Ambulatory Visit: Payer: Self-pay | Admitting: Student

## 2020-10-26 NOTE — Telephone Encounter (Signed)
Called home phone number regarding Omeprazole refill from pharmacy. Spoke with son who advised that patient did not have many pills left of medication but the best person to speak to about this would be her daughter, Brenda [name]. I called [name]'s phone number in chart, went to voicemail. Left message that I'd like to see patient in clinic in addition to speak with someone who is familiar with her medical history for the reason she is still on such a large dosage of Omeprazole ('40mg'$  BID).

## 2020-10-27 NOTE — Telephone Encounter (Signed)
Patient's daughter returns call to nurse line. Reports that she is unable to bring mother in for appointment due to patient needing ambulance transportation.   Daughter is also agreeable to virtual visit if needed.   Talbot Grumbling, RN

## 2020-10-27 NOTE — Telephone Encounter (Signed)
Call patient's daughter and discussed omeprazole refill from pharmacy.  Stated that GI provider was original prescriber for medication and there is no note of follow-up as requested by GI.  I advised her that I would refill medication for 2 months but that it would be best for her to have follow-up from GI as the dosage for the medication is a bit high and I am unsure if she still needs this medication.  She discussed with me how her mother is bedridden and has been for the past 2 years but that she would try to see if a virtual visit was possible.  Prescription sent for total of 2 months treatment - omeprazole 40 mg twice daily.

## 2020-11-06 DIAGNOSIS — Z7689 Persons encountering health services in other specified circumstances: Secondary | ICD-10-CM | POA: Diagnosis not present

## 2020-11-20 ENCOUNTER — Ambulatory Visit (INDEPENDENT_AMBULATORY_CARE_PROVIDER_SITE_OTHER): Payer: Medicare Other

## 2020-11-20 DIAGNOSIS — I472 Ventricular tachycardia, unspecified: Secondary | ICD-10-CM

## 2020-11-20 LAB — CUP PACEART REMOTE DEVICE CHECK
Battery Remaining Longevity: 9 mo
Battery Remaining Percentage: 10 %
Brady Statistic RA Percent Paced: 75 %
Brady Statistic RV Percent Paced: 0 %
Date Time Interrogation Session: 20220826110200
HighPow Impedance: 48 Ohm
Implantable Lead Implant Date: 20111110
Implantable Lead Implant Date: 20111110
Implantable Lead Location: 753859
Implantable Lead Location: 753860
Implantable Lead Model: 157
Implantable Lead Model: 4135
Implantable Lead Serial Number: 28803817
Implantable Lead Serial Number: 302861
Implantable Pulse Generator Implant Date: 20111110
Lead Channel Impedance Value: 431 Ohm
Lead Channel Impedance Value: 772 Ohm
Lead Channel Pacing Threshold Amplitude: 0.3 V
Lead Channel Pacing Threshold Amplitude: 0.8 V
Lead Channel Pacing Threshold Pulse Width: 0.4 ms
Lead Channel Pacing Threshold Pulse Width: 0.4 ms
Lead Channel Setting Pacing Amplitude: 2 V
Lead Channel Setting Pacing Amplitude: 2.4 V
Lead Channel Setting Pacing Pulse Width: 0.4 ms
Lead Channel Setting Sensing Sensitivity: 0.6 mV
Pulse Gen Serial Number: 169112

## 2020-12-01 DIAGNOSIS — Z7401 Bed confinement status: Secondary | ICD-10-CM | POA: Diagnosis not present

## 2020-12-01 DIAGNOSIS — R404 Transient alteration of awareness: Secondary | ICD-10-CM | POA: Diagnosis not present

## 2020-12-01 DIAGNOSIS — Z743 Need for continuous supervision: Secondary | ICD-10-CM | POA: Diagnosis not present

## 2020-12-02 NOTE — Progress Notes (Signed)
Remote ICD transmission.   

## 2020-12-04 DIAGNOSIS — R1310 Dysphagia, unspecified: Secondary | ICD-10-CM | POA: Diagnosis not present

## 2020-12-04 DIAGNOSIS — K5901 Slow transit constipation: Secondary | ICD-10-CM | POA: Diagnosis not present

## 2020-12-04 DIAGNOSIS — R532 Functional quadriplegia: Secondary | ICD-10-CM | POA: Diagnosis not present

## 2020-12-04 DIAGNOSIS — M549 Dorsalgia, unspecified: Secondary | ICD-10-CM | POA: Diagnosis not present

## 2020-12-04 DIAGNOSIS — Z515 Encounter for palliative care: Secondary | ICD-10-CM | POA: Diagnosis not present

## 2020-12-04 DIAGNOSIS — I4891 Unspecified atrial fibrillation: Secondary | ICD-10-CM | POA: Diagnosis not present

## 2020-12-04 DIAGNOSIS — R339 Retention of urine, unspecified: Secondary | ICD-10-CM | POA: Diagnosis not present

## 2020-12-04 DIAGNOSIS — I519 Heart disease, unspecified: Secondary | ICD-10-CM | POA: Diagnosis not present

## 2020-12-04 DIAGNOSIS — G8929 Other chronic pain: Secondary | ICD-10-CM | POA: Diagnosis not present

## 2020-12-04 DIAGNOSIS — D649 Anemia, unspecified: Secondary | ICD-10-CM | POA: Diagnosis not present

## 2020-12-04 DIAGNOSIS — R519 Headache, unspecified: Secondary | ICD-10-CM | POA: Diagnosis not present

## 2020-12-04 DIAGNOSIS — I679 Cerebrovascular disease, unspecified: Secondary | ICD-10-CM | POA: Diagnosis not present

## 2020-12-11 DIAGNOSIS — G8929 Other chronic pain: Secondary | ICD-10-CM | POA: Diagnosis not present

## 2020-12-11 DIAGNOSIS — I519 Heart disease, unspecified: Secondary | ICD-10-CM | POA: Diagnosis not present

## 2020-12-11 DIAGNOSIS — I4891 Unspecified atrial fibrillation: Secondary | ICD-10-CM | POA: Diagnosis not present

## 2020-12-11 DIAGNOSIS — I679 Cerebrovascular disease, unspecified: Secondary | ICD-10-CM | POA: Diagnosis not present

## 2020-12-11 DIAGNOSIS — Z515 Encounter for palliative care: Secondary | ICD-10-CM | POA: Diagnosis not present

## 2020-12-11 DIAGNOSIS — R339 Retention of urine, unspecified: Secondary | ICD-10-CM | POA: Diagnosis not present

## 2020-12-14 DIAGNOSIS — Z7401 Bed confinement status: Secondary | ICD-10-CM | POA: Diagnosis not present

## 2020-12-14 DIAGNOSIS — Z743 Need for continuous supervision: Secondary | ICD-10-CM | POA: Diagnosis not present

## 2020-12-14 DIAGNOSIS — R29898 Other symptoms and signs involving the musculoskeletal system: Secondary | ICD-10-CM | POA: Diagnosis not present

## 2020-12-16 ENCOUNTER — Telehealth: Payer: Self-pay

## 2020-12-16 NOTE — Telephone Encounter (Signed)
[employer] alert received to increase battery checks to monthly. Updated in [place]. Attempted to contact. No answer, LMTCB.

## 2020-12-17 NOTE — Telephone Encounter (Signed)
I spoke with Brenda [name] and he is not on the (dpr) I asked for [name] but she was not home. He states I will have to call back.

## 2020-12-21 NOTE — Telephone Encounter (Signed)
I spoke with the patient daughter and let her know that we have increased her home remotes. The patient has 6 months to ERI. [name] had some questions about the ICD and I let her speak with Theodoro Doing, rn.

## 2020-12-21 NOTE — Telephone Encounter (Signed)
Spoke with patient's daughter regarding ERI status, informed her that patient had 6 months until ERI and then an additional 3 months until there would be no guarantee that the device would correctly. Patients daughter stated that she was going to talk with her mother and her brother about turning off ICD therapies and then would call us back.

## 2021-01-11 ENCOUNTER — Telehealth: Payer: Self-pay | Admitting: Cardiology

## 2021-01-11 NOTE — Telephone Encounter (Signed)
Spoke to patients daughter Freada Bergeron). Offered to clarify difference between ICD and PPM and turning of ICD therapies.  States she appreciates the feedback but would like to speak to Dr. Curt Bears herself about turning off ICD therapies. Advised I will forward to Dr. Curt Bears per request.

## 2021-01-11 NOTE — Telephone Encounter (Signed)
Dr. Curt Bears spoke to pts dtr about this. The family is going to discuss and let us know if they would like the ICD turned off with pt nearing end of life. They will call back with the decision.

## 2021-01-11 NOTE — Telephone Encounter (Signed)
Pt' daughter Freada Bergeron is unsure of what kind of Defibrillator the pt have. Please advise Freada Bergeron  I offered to transfer her to the [facility] but she refused, she said she needs to speak with someone that know something about the type of Defib.

## 2021-01-18 ENCOUNTER — Ambulatory Visit (INDEPENDENT_AMBULATORY_CARE_PROVIDER_SITE_OTHER): Payer: Medicare Other

## 2021-01-18 DIAGNOSIS — I480 Paroxysmal atrial fibrillation: Secondary | ICD-10-CM

## 2021-01-18 LAB — CUP PACEART REMOTE DEVICE CHECK
Battery Remaining Longevity: 4 mo
Battery Remaining Percentage: 4 %
Brady Statistic RA Percent Paced: 67 %
Brady Statistic RV Percent Paced: 2 %
Date Time Interrogation Session: 20221023031200
HighPow Impedance: 46 Ohm
Implantable Lead Implant Date: 20111110
Implantable Lead Implant Date: 20111110
Implantable Lead Location: 753859
Implantable Lead Location: 753860
Implantable Lead Model: 157
Implantable Lead Model: 4135
Implantable Lead Serial Number: 28803817
Implantable Lead Serial Number: 302861
Implantable Pulse Generator Implant Date: 20111110
Lead Channel Impedance Value: 490 Ohm
Lead Channel Impedance Value: 786 Ohm
Lead Channel Pacing Threshold Amplitude: 0.3 V
Lead Channel Pacing Threshold Amplitude: 0.8 V
Lead Channel Pacing Threshold Pulse Width: 0.4 ms
Lead Channel Pacing Threshold Pulse Width: 0.4 ms
Lead Channel Setting Pacing Amplitude: 2 V
Lead Channel Setting Pacing Amplitude: 2.4 V
Lead Channel Setting Pacing Pulse Width: 0.4 ms
Lead Channel Setting Sensing Sensitivity: 0.6 mV
Pulse Gen Serial Number: 169112

## 2021-01-26 NOTE — Progress Notes (Signed)
Remote ICD transmission.   

## 2021-02-19 ENCOUNTER — Encounter

## 2021-02-21 LAB — CUP PACEART REMOTE DEVICE CHECK
Battery Remaining Longevity: 3 mo
Battery Remaining Percentage: 3 %
Brady Statistic RA Percent Paced: 60 %
Brady Statistic RV Percent Paced: 2 %
Date Time Interrogation Session: 20221125042700
HighPow Impedance: 49 Ohm
Implantable Lead Implant Date: 20111110
Implantable Lead Implant Date: 20111110
Implantable Lead Location: 753859
Implantable Lead Location: 753860
Implantable Lead Model: 157
Implantable Lead Model: 4135
Implantable Lead Serial Number: 28803817
Implantable Lead Serial Number: 302861
Implantable Pulse Generator Implant Date: 20111110
Lead Channel Impedance Value: 532 Ohm
Lead Channel Impedance Value: 744 Ohm
Lead Channel Pacing Threshold Amplitude: 0.3 V
Lead Channel Pacing Threshold Amplitude: 0.8 V
Lead Channel Pacing Threshold Pulse Width: 0.4 ms
Lead Channel Pacing Threshold Pulse Width: 0.4 ms
Lead Channel Setting Pacing Amplitude: 2 V
Lead Channel Setting Pacing Amplitude: 2.4 V
Lead Channel Setting Pacing Pulse Width: 0.4 ms
Lead Channel Setting Sensing Sensitivity: 0.6 mV
Pulse Gen Serial Number: 169112

## 2021-02-22 ENCOUNTER — Ambulatory Visit (INDEPENDENT_AMBULATORY_CARE_PROVIDER_SITE_OTHER): Payer: Medicare Other

## 2021-02-22 DIAGNOSIS — I480 Paroxysmal atrial fibrillation: Secondary | ICD-10-CM

## 2021-03-02 NOTE — Progress Notes (Signed)
Remote ICD transmission.   

## 2021-03-10 ENCOUNTER — Telehealth: Payer: Self-pay | Admitting: Cardiology

## 2021-03-28 NOTE — Telephone Encounter (Signed)
See phone encounter 01/11/21.  Spoke with patient daughter, [name].  She indicates that patient is on Hospice care, they indicate she is deteriorating/ transitioning.    Patient is bed bound.  She is receiving care from [facility] [phone].  Nurse is Luellen Pucker, RN  Advised I will need to get order from Dr. Curt Bears, we can then have [place] rep meet Hospice nurse at house to turn off ICD.

## 2021-03-28 NOTE — Telephone Encounter (Signed)
Office received call, pt has passed away.

## 2021-03-28 NOTE — Telephone Encounter (Signed)
Patient's daughter would like for her mother's ICD defib to be turned off.

## 2021-03-28 DEATH — deceased
